# Patient Record
Sex: Female | Born: 1965 | Race: White | Hispanic: No | Marital: Married | Smoking: Current every day smoker
Health system: Southern US, Community
[De-identification: ages and names within clinical notes are randomized; demographics above are authoritative.]

## PROBLEM LIST (undated history)

## (undated) DIAGNOSIS — F17200 Nicotine dependence, unspecified, uncomplicated: Secondary | ICD-10-CM

## (undated) DIAGNOSIS — J189 Pneumonia, unspecified organism: Secondary | ICD-10-CM

## (undated) DIAGNOSIS — Z923 Personal history of irradiation: Secondary | ICD-10-CM

## (undated) DIAGNOSIS — F419 Anxiety disorder, unspecified: Secondary | ICD-10-CM

## (undated) DIAGNOSIS — G8929 Other chronic pain: Secondary | ICD-10-CM

## (undated) DIAGNOSIS — K219 Gastro-esophageal reflux disease without esophagitis: Secondary | ICD-10-CM

## (undated) DIAGNOSIS — E119 Type 2 diabetes mellitus without complications: Secondary | ICD-10-CM

## (undated) DIAGNOSIS — Z9221 Personal history of antineoplastic chemotherapy: Secondary | ICD-10-CM

## (undated) DIAGNOSIS — J449 Chronic obstructive pulmonary disease, unspecified: Secondary | ICD-10-CM

## (undated) DIAGNOSIS — IMO0001 Reserved for inherently not codable concepts without codable children: Secondary | ICD-10-CM

## (undated) DIAGNOSIS — J45909 Unspecified asthma, uncomplicated: Secondary | ICD-10-CM

## (undated) HISTORY — DX: Nicotine dependence, unspecified, uncomplicated: F17.200

## (undated) HISTORY — PX: TONSILLECTOMY: SUR1361

## (undated) HISTORY — DX: Reserved for inherently not codable concepts without codable children: IMO0001

## (undated) HISTORY — PX: CHOLECYSTECTOMY: SHX55

---

## 1999-05-17 ENCOUNTER — Encounter: Payer: Self-pay | Admitting: Emergency Medicine

## 1999-05-17 ENCOUNTER — Emergency Department (HOSPITAL_COMMUNITY): Admission: EM | Admit: 1999-05-17 | Discharge: 1999-05-17 | Payer: Self-pay | Admitting: Emergency Medicine

## 2000-04-03 LAB — HM MAMMOGRAPHY: HM Mammogram: NORMAL

## 2000-05-01 ENCOUNTER — Other Ambulatory Visit: Admission: RE | Admit: 2000-05-01 | Discharge: 2000-05-01 | Payer: Self-pay | Admitting: Family Medicine

## 2000-07-17 ENCOUNTER — Encounter: Admission: RE | Admit: 2000-07-17 | Discharge: 2000-10-15 | Payer: Self-pay | Admitting: Family Medicine

## 2001-01-29 ENCOUNTER — Emergency Department (HOSPITAL_COMMUNITY): Admission: EM | Admit: 2001-01-29 | Discharge: 2001-01-29 | Payer: Self-pay | Admitting: Emergency Medicine

## 2003-04-06 ENCOUNTER — Other Ambulatory Visit: Admission: RE | Admit: 2003-04-06 | Discharge: 2003-04-06 | Payer: Self-pay | Admitting: Family Medicine

## 2005-02-06 ENCOUNTER — Ambulatory Visit: Payer: Self-pay | Admitting: Family Medicine

## 2005-03-23 ENCOUNTER — Emergency Department (HOSPITAL_COMMUNITY): Admission: EM | Admit: 2005-03-23 | Discharge: 2005-03-23 | Payer: Self-pay | Admitting: Emergency Medicine

## 2006-06-05 ENCOUNTER — Other Ambulatory Visit: Admission: RE | Admit: 2006-06-05 | Discharge: 2006-06-05 | Payer: Self-pay | Admitting: Internal Medicine

## 2006-06-05 ENCOUNTER — Encounter (INDEPENDENT_AMBULATORY_CARE_PROVIDER_SITE_OTHER): Payer: Self-pay | Admitting: Internal Medicine

## 2006-06-05 ENCOUNTER — Ambulatory Visit: Payer: Self-pay | Admitting: Family Medicine

## 2006-06-05 DIAGNOSIS — R7309 Other abnormal glucose: Secondary | ICD-10-CM | POA: Insufficient documentation

## 2006-06-05 LAB — CONVERTED CEMR LAB
ALT: 32 units/L (ref 0–40)
AST: 20 units/L (ref 0–37)
Albumin: 4.2 g/dL (ref 3.5–5.2)
Alkaline Phosphatase: 72 units/L (ref 39–117)
BUN: 9 mg/dL (ref 6–23)
Basophils Absolute: 0.1 10*3/uL (ref 0.0–0.1)
Basophils Relative: 0.7 % (ref 0.0–1.0)
Bilirubin, Direct: 0.1 mg/dL (ref 0.0–0.3)
CO2: 28 meq/L (ref 19–32)
Calcium: 9.5 mg/dL (ref 8.4–10.5)
Chloride: 99 meq/L (ref 96–112)
Cholesterol: 225 mg/dL (ref 0–200)
Creatinine, Ser: 0.7 mg/dL (ref 0.4–1.2)
Direct LDL: 130.5 mg/dL
Eosinophils Absolute: 0.3 10*3/uL (ref 0.0–0.6)
Eosinophils Relative: 2.6 % (ref 0.0–5.0)
GFR calc Af Amer: 119 mL/min
GFR calc non Af Amer: 98 mL/min
Glucose, Bld: 105 mg/dL — ABNORMAL HIGH (ref 70–99)
HCT: 42.8 % (ref 36.0–46.0)
HDL: 35.8 mg/dL — ABNORMAL LOW (ref >39.0)
Hemoglobin: 14.8 g/dL (ref 12.0–15.0)
Lymphocytes Relative: 23.6 % (ref 12.0–46.0)
MCHC: 34.7 g/dL (ref 30.0–36.0)
MCV: 94 fL (ref 78.0–100.0)
Monocytes Absolute: 0.5 10*3/uL (ref 0.2–0.7)
Monocytes Relative: 5.5 % (ref 3.0–11.0)
Neutro Abs: 6.6 10*3/uL (ref 1.4–7.7)
Neutrophils Relative %: 67.6 % (ref 43.0–77.0)
Pap Smear: NORMAL
Platelets: 267 10*3/uL (ref 150–400)
Potassium: 4.4 meq/L (ref 3.5–5.1)
RBC: 4.55 M/uL (ref 3.87–5.11)
RDW: 11.4 % — ABNORMAL LOW (ref 11.5–14.6)
Sodium: 134 meq/L — ABNORMAL LOW (ref 135–145)
TSH: 0.61 microintl units/mL (ref 0.35–5.50)
Total Bilirubin: 0.9 mg/dL (ref 0.3–1.2)
Total CHOL/HDL Ratio: 6.3
Total Protein: 7.1 g/dL (ref 6.0–8.3)
Triglycerides: 253 mg/dL (ref 0–149)
VLDL: 51 mg/dL — ABNORMAL HIGH (ref 0–40)
WBC: 9.8 10*3/uL (ref 4.5–10.5)

## 2006-06-11 ENCOUNTER — Ambulatory Visit: Payer: Self-pay | Admitting: Family Medicine

## 2006-07-06 ENCOUNTER — Ambulatory Visit: Payer: Self-pay | Admitting: Family Medicine

## 2006-07-31 ENCOUNTER — Ambulatory Visit: Payer: Self-pay | Admitting: Family Medicine

## 2006-11-11 ENCOUNTER — Telehealth (INDEPENDENT_AMBULATORY_CARE_PROVIDER_SITE_OTHER): Payer: Self-pay | Admitting: *Deleted

## 2006-12-10 ENCOUNTER — Telehealth (INDEPENDENT_AMBULATORY_CARE_PROVIDER_SITE_OTHER): Payer: Self-pay | Admitting: Internal Medicine

## 2006-12-14 ENCOUNTER — Ambulatory Visit: Payer: Self-pay | Admitting: Family Medicine

## 2006-12-14 DIAGNOSIS — F411 Generalized anxiety disorder: Secondary | ICD-10-CM | POA: Insufficient documentation

## 2006-12-14 DIAGNOSIS — N920 Excessive and frequent menstruation with regular cycle: Secondary | ICD-10-CM | POA: Insufficient documentation

## 2006-12-14 DIAGNOSIS — E782 Mixed hyperlipidemia: Secondary | ICD-10-CM | POA: Insufficient documentation

## 2006-12-14 DIAGNOSIS — M545 Low back pain, unspecified: Secondary | ICD-10-CM | POA: Insufficient documentation

## 2006-12-14 DIAGNOSIS — R209 Unspecified disturbances of skin sensation: Secondary | ICD-10-CM | POA: Insufficient documentation

## 2006-12-16 LAB — CONVERTED CEMR LAB
Cholesterol: 210 mg/dL (ref 0–200)
Direct LDL: 121.4 mg/dL
Glucose, Bld: 108 mg/dL — ABNORMAL HIGH (ref 70–99)
HDL: 30.6 mg/dL — ABNORMAL LOW (ref >39.0)
Total CHOL/HDL Ratio: 6.9
Triglycerides: 298 mg/dL (ref 0–149)
VLDL: 60 mg/dL — ABNORMAL HIGH (ref 0–40)

## 2006-12-17 ENCOUNTER — Encounter (INDEPENDENT_AMBULATORY_CARE_PROVIDER_SITE_OTHER): Payer: Self-pay | Admitting: Internal Medicine

## 2006-12-17 ENCOUNTER — Encounter: Payer: Self-pay | Admitting: Family Medicine

## 2006-12-29 ENCOUNTER — Telehealth (INDEPENDENT_AMBULATORY_CARE_PROVIDER_SITE_OTHER): Payer: Self-pay | Admitting: Internal Medicine

## 2007-01-27 ENCOUNTER — Encounter (INDEPENDENT_AMBULATORY_CARE_PROVIDER_SITE_OTHER): Payer: Self-pay | Admitting: Internal Medicine

## 2007-01-27 DIAGNOSIS — F17200 Nicotine dependence, unspecified, uncomplicated: Secondary | ICD-10-CM | POA: Insufficient documentation

## 2007-02-03 ENCOUNTER — Ambulatory Visit: Payer: Self-pay | Admitting: Family Medicine

## 2007-07-06 ENCOUNTER — Telehealth (INDEPENDENT_AMBULATORY_CARE_PROVIDER_SITE_OTHER): Payer: Self-pay | Admitting: Internal Medicine

## 2007-07-12 ENCOUNTER — Ambulatory Visit: Payer: Self-pay | Admitting: Family Medicine

## 2007-07-15 ENCOUNTER — Encounter (INDEPENDENT_AMBULATORY_CARE_PROVIDER_SITE_OTHER): Payer: Self-pay | Admitting: Internal Medicine

## 2007-08-02 ENCOUNTER — Encounter (INDEPENDENT_AMBULATORY_CARE_PROVIDER_SITE_OTHER): Payer: Self-pay | Admitting: Internal Medicine

## 2007-12-15 ENCOUNTER — Ambulatory Visit: Payer: Self-pay | Admitting: Family Medicine

## 2007-12-15 ENCOUNTER — Encounter (INDEPENDENT_AMBULATORY_CARE_PROVIDER_SITE_OTHER): Payer: Self-pay | Admitting: Internal Medicine

## 2007-12-15 DIAGNOSIS — F329 Major depressive disorder, single episode, unspecified: Secondary | ICD-10-CM | POA: Insufficient documentation

## 2007-12-15 DIAGNOSIS — N390 Urinary tract infection, site not specified: Secondary | ICD-10-CM | POA: Insufficient documentation

## 2007-12-15 DIAGNOSIS — F32A Depression, unspecified: Secondary | ICD-10-CM | POA: Insufficient documentation

## 2007-12-15 LAB — CONVERTED CEMR LAB
Bilirubin Urine: NEGATIVE
Glucose, Urine, Semiquant: NEGATIVE
Ketones, urine, test strip: NEGATIVE
Nitrite: NEGATIVE
Protein, U semiquant: NEGATIVE
Specific Gravity, Urine: 1.02
Urobilinogen, UA: 0.2
WBC Urine, dipstick: NEGATIVE
pH: 6.5

## 2007-12-16 ENCOUNTER — Encounter (INDEPENDENT_AMBULATORY_CARE_PROVIDER_SITE_OTHER): Payer: Self-pay | Admitting: Internal Medicine

## 2007-12-16 LAB — CONVERTED CEMR LAB: Hgb A1c MFr Bld: 5.5 % (ref 4.6–6.0)

## 2007-12-17 ENCOUNTER — Encounter (INDEPENDENT_AMBULATORY_CARE_PROVIDER_SITE_OTHER): Payer: Self-pay | Admitting: Internal Medicine

## 2008-02-14 ENCOUNTER — Telehealth: Payer: Self-pay | Admitting: Family Medicine

## 2008-03-13 ENCOUNTER — Telehealth: Payer: Self-pay | Admitting: Family Medicine

## 2008-06-22 ENCOUNTER — Ambulatory Visit: Payer: Self-pay | Admitting: Family Medicine

## 2008-06-22 DIAGNOSIS — L989 Disorder of the skin and subcutaneous tissue, unspecified: Secondary | ICD-10-CM | POA: Insufficient documentation

## 2008-06-23 ENCOUNTER — Ambulatory Visit: Payer: Self-pay | Admitting: Family Medicine

## 2008-06-23 ENCOUNTER — Encounter (INDEPENDENT_AMBULATORY_CARE_PROVIDER_SITE_OTHER): Payer: Self-pay | Admitting: Internal Medicine

## 2008-06-26 ENCOUNTER — Telehealth (INDEPENDENT_AMBULATORY_CARE_PROVIDER_SITE_OTHER): Payer: Self-pay | Admitting: Internal Medicine

## 2008-07-04 ENCOUNTER — Ambulatory Visit: Payer: Self-pay | Admitting: Family Medicine

## 2008-10-10 ENCOUNTER — Telehealth (INDEPENDENT_AMBULATORY_CARE_PROVIDER_SITE_OTHER): Payer: Self-pay | Admitting: Internal Medicine

## 2010-05-07 NOTE — Miscellaneous (Signed)
Summary: Consent for lesion removed on R back  Consent for lesion removed on R back   Imported By: Beau Fanny 06/26/2008 15:04:24  _____________________________________________________________________  External Attachment:    Type:   Image     Comment:   External Document

## 2010-05-07 NOTE — Progress Notes (Signed)
  Phone Note Call from Patient   Caller: pt Call For: [facility] Summary of Call: requesting med change for anxiety. took husband's valium 10 mg. really helped her. consider prescribing this for her? [facility] [address]. Initial call taken by: Cooper Render,  December 10, 2006 12:25 PM  Follow-up for Phone Call        needs office visit to discuss Follow-up by: Gildardo Griffes FNP,  December 10, 2006 1:40 PM  Additional Follow-up for Phone Call Additional follow up Details #1::        pc to pt, appt 12/14/06 at 10:45 am. Additional Follow-up by: Cooper Render,  December 10, 2006 3:01 PM

## 2010-05-07 NOTE — Progress Notes (Signed)
Summary: refill anaprox ds for Julia [name] patient  Phone Note Refill Request Message from:  [employer]line on March 13, 2008 8:42 AM  Refills Requested: Medication #1:  ANAPROX DS 550 MG TABS one tab by mouth two times a day as needed for menstrual cramping..   Last Refilled: 02/14/2008 [facility] [address] [phone]  Initial call taken by: Providence Crosby,  March 13, 2008 8:43 AM    New/Updated Medications: ANAPROX DS 550 MG TABS (NAPROXEN SODIUM) one tab by mouth two times a day as needed for menstrual cramping. (Take with Food)   Prescriptions: ANAPROX DS 550 MG TABS (NAPROXEN SODIUM) one tab by mouth two times a day as needed for menstrual cramping. (Take with Food)  #60 x 0   Entered and Authorized by:   Shaune Leeks MD   Signed by:   Shaune Leeks MD on 03/13/2008   Method used:   Electronically to        [facility] [phone]* (retail)       [address]       [place], [place]  [address]       Ph: [phone]       Fax: [phone]   RxID:   [id number]

## 2010-05-07 NOTE — Assessment & Plan Note (Signed)
Summary: 5 M F/U  DLO   Vital Signs:  Patient Profile:   45 Years Old Female Weight:      159 pounds Temp:     98.5 degrees F oral Pulse rate:   89 / minute BP sitting:   109 / 65  (left arm) Cuff size:   regular  Vitals Entered By: Cooper Render (December 15, 2007 3:44 PM)                 Chief Complaint:  6 mth f/u, ck glucose, and has been elevated.  History of Present Illness: Here for follow up of anxiety and low back pain --stopped prozac 6/09--new job at [employer]--no pressure.  Doing welll off for anxiety but "nerves"  aren't good.    --aunt died unexpectedly --not sleeping well, not crying, goes to work daily, not rested on rising--wants to start dsomething   Having odor in urine for 2 wks, no dysuria or nocturia    Wants blood sugar checked as has been elevated at home, father was diabetic and mom is diabetic.  CBGs running ---175 fasting, 2hrs pp 150    Current Allergies (reviewed today): ! DARVOCET ! RELAFEN ! VIOXX   Family History:    Father: Alive, diabetic, CAD--died at age 24 of ?    Mother: Alive, HTN, --insulin dependent diabetic    Siblings: 2 brothers,                      1 deceased, MVA at age 65                     1 living --kidney failure--resolved, HBP                  1sister--no knowledge        CV + Father    HBP + Mother    DM + Father    Cancer - none    Depression - none    ETOH/Drug abuse - none       Impression & Recommendations:  Problem # 1:  DEPRESSIVE DISORDER NOT ELSEWHERE CLASSIFIED (ICD-311)  The following medications were removed from the medication list:    Prozac 20 Mg Caps (Fluoxetine hcl) .Marland Kitchen... 1 qd  Her updated medication list for this problem includes:    Pristiq 50 Mg Xr24h-tab (Desvenlafaxine succinate) .Marland Kitchen... 1 once daily for depression by mouth   Problem # 2:  HYPERGLYCEMIA (ICD-790.29)  Orders: Venipuncture (62130) TLB-A1C / Hgb A1C (Glycohemoglobin) (83036-A1C)   Problem # 3:  UTI  (ICD-599.0)  Orders: T-Culture, Urine (86578-46962) UA Dipstick W/ Micro (manual) (95284)   Complete Medication List: 1)  Pristiq 50 Mg Xr24h-tab (Desvenlafaxine succinate) .Marland Kitchen.. 1 once daily for depression by mouth   Patient Instructions: 1)  Please schedule a follow-up appointment in 1 month.   ] Prior Medications (reviewed today): Current Allergies (reviewed today): ! DARVOCET ! RELAFEN ! VIOXX  Laboratory Results   Urine Tests  Date/Time Received: December 15, 2007 4:28 PM  Date/Time Reported: December 15, 2007 4:28 PM   Routine Urinalysis   Glucose: negative   (Normal Range: Negative) Bilirubin: negative   (Normal Range: Negative) Ketone: negative   (Normal Range: Negative) Spec. Gravity: 1.020   (Normal Range: 1.003-1.035) Blood: trace-lysed   (Normal Range: Negative) pH: 6.5   (Normal Range: 5.0-8.0) Protein: negative   (Normal Range: Negative) Urobilinogen: 0.2   (Normal Range: 0-1) Nitrite: negative   (  Normal Range: Negative) Leukocyte Esterace: negative   (Normal Range: Negative)

## 2010-05-07 NOTE — Assessment & Plan Note (Signed)
Summary: 6 m f/u  dlo   Vital Signs:  Patient Profile:   45 Years Old Female Weight:      154 pounds Temp:     98.2 degrees F oral Pulse rate:   97 / minute BP sitting:   110 / 73  (right arm) Cuff size:   regular  Vitals Entered By: Cooper Render (July 12, 2007 3:55 PM)                 Chief Complaint:  back pain and 6 mth f/u.  History of Present Illness: Here for follow up of anxiety--having no anxiety attacks, does have more stress at work and at [employer] or suicidal thoughts. --son's house got broken into. --son's best frend killed--motorcycle.  No PT for back as insurance would not pay.Marland KitchenMarland KitchenTaking nothing.  Using heat.      Current Allergies (reviewed today): ! DARVOCET ! RELAFEN ! VIOXX     Review of Systems      See HPI   Physical Exam  General:     alert, well-developed, well-nourished, and well-hydrated.  NAD Neurologic:     alert & oriented X3 and gait normal.   Psych:     Oriented X3, normally interactive, and good eye contact.      Impression & Recommendations:  Problem # 1:  ANXIETY (ICD-300.00) Assessment: Unchanged no new anxiety attacks since on prozac--wants to continue see back in 6 mo or prn Her updated medication list for this problem includes:    Prozac 20 Mg Caps (Fluoxetine hcl) .Marland Kitchen... 1 qd   Problem # 2:  BACK PAIN, LUMBAR (ICD-724.2) Assessment: Unchanged no improvement in low back pain will refer to Ortho for eval see back prn The following medications were removed from the medication list:    Vicodin 5-500 Mg Tabs (Hydrocodone-acetaminophen) .Marland Kitchen... 1 q 6 h as needed pain--caution driving due to drowsiness  Orders: Orthopedic Referral (Ortho)   Complete Medication List: 1)  Prozac 20 Mg Caps (Fluoxetine hcl) .Marland Kitchen.. 1 qd   Patient Instructions: 1)  refer to Ortho low back pain    Prescriptions: PROZAC 20 MG  CAPS (FLUOXETINE HCL) 1 qd  #30 x 6   Entered and Authorized by:   Gildardo Griffes FNP   Signed by:   Gildardo Griffes FNP on 07/12/2007   Method used:   Print then Give to Patient   RxID:   [id number]  ] Prior Medications (reviewed today): PROZAC 20 MG  CAPS (FLUOXETINE HCL) 1 qd Current Allergies (reviewed today): ! DARVOCET ! RELAFEN ! VIOXX

## 2010-05-07 NOTE — Progress Notes (Signed)
Summary: ? Location of lesion  Phone Note From Other Clinic Call back at [phone]   Caller: Tresa Endo from [facility] Call For: Everrett Coombe, FNP Summary of Call: Calling to clarify the location of the lesion that was sent over to them. Initial call taken by: Sydell Axon,  June 26, 2008 11:57 AM  Follow-up for Phone Call        [facility] informed per Billie's office notes. Follow-up by: Sydell Axon,  June 26, 2008 11:59 AM

## 2010-05-07 NOTE — Assessment & Plan Note (Signed)
Summary: 30 MIN APPT MOLE REMOVAL/RBH   Vital Signs:  Patient profile:   45 year old female Weight:      159 pounds Temp:     98.4 degrees F oral Pulse rate:   78 / minute BP sitting:   110 / 60  (left arm) Cuff size:   regular  Vitals Entered ByMarland Kitchen Providence Crosby (June 23, 2008 3:17 PM)  CC:  lesion removal on back.  History of Present Illness: Here for removal of lesion on her R back that was traumatized 2d ago by bra strap--top pulled off.  Rubs on most of her bra straps and becomes irritated.  Allergies: 1)  ! Darvocet 2)  ! Relafen 3)  ! Vioxx  Review of Systems      See HPI  Physical Exam  General:  alert, well-developed, well-nourished, and well-hydrated.   Skin:  lesion  ~79mm tan elevated slightly pearly with upper half removed on R upper back--area cleaned by Betadine, infiltrated with 0.5 ml of 2% lidocaine.  was removed using punch and sent to path.  closed with 1 stitch of 3-0 etithicon, covered Psych:  normally interactive and slightly anxious.     Impression & Recommendations:  Problem # 1:  SKIN LESION (ICD-709.9) Assessment Unchanged  lesion removed by punch, sutured x 1, covered.  lesion sent to path keep clean and dry see back 10d for suture removal or as needed   Orders: Excise lesion (TAL) 0 - 0.5 cm (11400) Surgical Suture Tray (J4782)  Complete Medication List: 1)  Anaprox Ds 550 Mg Tabs (Naproxen sodium) .... One tab by mouth two times a day as needed for menstrual cramping. (take with food) 2)  Ibuprofen 200 Mg Caps (Ibuprofen) .... As needed  Other Orders: Tdap => 71yrs IM (95621) Admin 1st Vaccine (30865)  Patient Instructions: 1)  return in 10d for suture removal   Tetanus/Td Vaccine    Vaccine Type: Tdap    Site: left deltoid    Mfr: GlaxoSmithKline    Dose: 0.5 ml    Route: IM    Given by: Providence Crosby    Exp. Date: 06/29/2010    Lot #: [id number]    VIS given: 02/23/07 version given June 23, 2008.

## 2010-05-07 NOTE — Assessment & Plan Note (Signed)
      Current Allergies: ! DARVOCET ! RELAFEN ! VIOXX     Review of Systems  GU      See HPI  Psych      Complains of anxiety and depression.  Endo      See HPI   Physical Exam  General:     alert, well-developed, well-nourished, and well-hydrated.   Head:     normocephalic, atraumatic, and no abnormalities observed.   Lungs:     normal respiratory effort, no intercostal retractions, no accessory muscle use, and normal breath sounds.   Heart:     normal rate, regular rhythm, and no murmur.   Abdomen:     no CVA tenderness, no tenderness in suprapubic area Psych:     normally interactive, flat affect, and subdued.      Complete Medication List: 1)  Pristiq 50 Mg Xr24h-tab (Desvenlafaxine succinate) .Marland Kitchen.. 1 once daily for depression by mouth    ] Laboratory Results   Urine Tests    Urine Microscopic WBC/hpf: 3-5 RBC/hpf: 0-2 Bacteria: ++ Epithelial: +

## 2010-05-07 NOTE — Progress Notes (Signed)
Summary: refill request for anaprox  Phone Note Refill Request Message from:  Fax from Pharmacy  Refills Requested: Medication #1:  ANAPROX DS 550 MG TABS one tab by mouth two times a day as needed for menstrual cramping. (Take with Food)   Last Refilled: 08/14/2008 Faxed request from [facility] [address]  This was signed off on the other day, but it doesnt look like it was sent.  Initial call taken by: Lowella Petties CMA,  October 10, 2008 10:58 AM  Follow-up for Phone Call        completed  Billie-Lynn Tyler Deis FNP  October 10, 2008 11:12 AM   Medication phoned to [facility] [address] pharmacy as instructed. Follow-up by: Lewanda Rife LPN,  October 10, 1608 11:56 AM      Prescriptions: ANAPROX DS 550 MG TABS (NAPROXEN SODIUM) one tab by mouth two times a day as needed for menstrual cramping. (Take with Food)  #60 x 0   Entered and Authorized by:   Gildardo Griffes FNP   Signed by:   Gildardo Griffes FNP on 10/10/2008   Method used:   Telephoned to ...       [facility] [id number] (retail)       [address]       [place], [place]  [address]       Ph: [phone]       Fax: [phone]   RxID:   [id number]

## 2010-05-07 NOTE — Miscellaneous (Signed)
Summary: Orders Update  Clinical Lists Changes  Medications: Added new medication of CIPRO 500 MG TABS (CIPROFLOXACIN HCL) 1 two times a day for UTI by mouth - Signed Rx of CIPRO 500 MG TABS (CIPROFLOXACIN HCL) 1 two times a day for UTI by mouth;  #14 x 0;  Signed;  Entered by: Gildardo Griffes FNP;  Authorized by: Gildardo Griffes FNP;  Method used: Telephoned to    Prescriptions: CIPRO 500 MG TABS (CIPROFLOXACIN HCL) 1 two times a day for UTI by mouth  #14 x 0   Entered and Authorized by:   Gildardo Griffes FNP   Signed by:   Gildardo Griffes FNP on 12/17/2007   Method used:   Telephoned to ...         RxID:   [id number]  med phoned to pharmacy. pt informed.   Cooper Render  December 17, 2007 5:28 PM

## 2010-05-07 NOTE — Assessment & Plan Note (Signed)
Summary: FOLLOW UP/EVE   Vital Signs:  Patient Profile:   45 Years Old Female Weight:      156 pounds Temp:     98.5 degrees F oral Pulse rate:   80 / minute Pulse rhythm:   regular BP sitting:   100 / 50  (right arm) Cuff size:   regular  Vitals Entered By: Sydell Axon (February 03, 2007 3:50 PM)                 Chief Complaint:  6 month f/u.  History of Present Illness: Here to follow up on change of antianxiety meds fsrom Buspar to Prozac 9/08--feels better, no anxietoy attacks, no crlying.  Problems sleeping but feels that this is due to lher perimenapause.  Smokes 1/2pk per day.  Wants to stay on Prozac.  Still having back pain--tramadol did not help, vicodin helps.  Agrees to try PT.  Current Allergies (reviewed today): ! DARVOCET ! RELAFEN ! VIOXX     Review of Systems      See HPI   Physical Exam  General:     alert, well-developed, and well-nourished.   Head:     normocephalic, atraumatic, and no abnormalities observed.   Lungs:     normal respiratory effort.   Neurologic:     alert & oriented X3 and gait normal.   Skin:     color normal.   Psych:     normally interactive, depressed affect, and flat affect.      Impression & Recommendations:  Problem # 1:  ANXIETY (ICD-300.00) Assessment: Improved much improved on prozac--wants to continue. see back in 6 mo or as needed  Her updated medication list for this problem includes:    Prozac 20 Mg Caps (Fluoxetine hcl) .Marland Kitchen... 1 qd   Problem # 2:  BACK PAIN, LUMBAR (ICD-724.2) Assessment: Unchanged reviewed xrays of L/S spine with her done 12/17/06--neg agrees t go to PT Her updated medication list for this problem includes:    Tramadol Hcl 50 Mg Tabs (Tramadol hcl) .Marland Kitchen... 1 q 6 h as needed pain    Vicodin 5-500 Mg Tabs (Hydrocodone-acetaminophen) .Marland Kitchen... 1 q 6 h as needed pain--caution driving due to drowsiness--rrefilled x1--must l;ast 1 mo  Orders: Physical Therapy Referral (PT)  The  following medications were removed from the medication list:    Tramadol Hcl 50 Mg Tabs (Tramadol hcl) .Marland Kitchen... 1 q 6 h as needed pain  Her updated medication list for this problem includes:    Vicodin 5-500 Mg Tabs (Hydrocodone-acetaminophen) .Marland Kitchen... 1 q 6 h as needed pain--caution driving due to drowsiness   Complete Medication List: 1)  Prozac 20 Mg Caps (Fluoxetine hcl) .Marland Kitchen.. 1 qd 2)  Vicodin 5-500 Mg Tabs (Hydrocodone-acetaminophen) .Marland Kitchen.. 1 q 6 h as needed pain--caution driving due to drowsiness   Patient Instructions: 1)  Please schedule a follow-up appointment in 6 months 2)  .     Prescriptions: VICODIN 5-500 MG  TABS (HYDROCODONE-ACETAMINOPHEN) 1 q 6 h as needed pain--caution driving due to drowsiness  #30 x 0   Entered and Authorized by:   Gildardo Griffes FNP   Signed by:   Gildardo Griffes FNP on 02/03/2007   Method used:   Print then Give to Patient   RxID:   [id number] PROZAC 20 MG  CAPS (FLUOXETINE HCL) 1 qd  #30 x 6   Entered and Authorized by:   Gildardo Griffes FNP   Signed by:   Mcarthur Rossetti  Meleana Commerford FNP on 02/03/2007   Method used:   Print then Give to Patient   RxID:   [id number]  ] Current Allergies (reviewed today): ! DARVOCET ! RELAFEN ! VIOXX

## 2010-05-07 NOTE — Progress Notes (Signed)
Summary: wants meds for cramps  Phone Note Call from Patient Call back at [phone]   Caller: Patient Call For: Billie Bean Complaint: Headache Summary of Call: Pt is having pain in lower abd and back due to menses, she says Billie always gives her vicodin or darvocet, please advise.  [facility]ses [facility] [address] Initial call taken by: Lowella Petties,  February 14, 2008 2:04 PM  Follow-up for Phone Call        Per 8/08 note, wioll use Anaprox. Follow-up by: Shaune Leeks MD,  February 14, 2008 2:40 PM  Additional Follow-up for Phone Call Additional follow up Details #1::        Anaprox called to [facility] [address] Additional Follow-up by: Lowella Petties,  February 14, 2008 3:53 PM    New/Updated Medications: ANAPROX DS 550 MG TABS (NAPROXEN SODIUM) one tab by mouth two times a day as needed for menstrual cramping.   Prescriptions: ANAPROX DS 550 MG TABS (NAPROXEN SODIUM) one tab by mouth two times a day as needed for menstrual cramping.  #30 x 0   Entered and Authorized by:   Shaune Leeks MD   Signed by:   Lowella Petties on 02/14/2008   Method used:   Telephoned to ...       [address]* (retail)       [address]       [place], [place]  [address]       Ph: [phone]       Fax: [phone]   RxID:   [id number]

## 2010-05-07 NOTE — Progress Notes (Signed)
  Phone Note Outgoing Call   Call placed by: Cooper Render,  December 29, 2006 12:51 PM Call placed to: pt Summary of Call: pc to pt to give xray results.  tramadol no help. please call in something for pain to [facility] [address] [phone] Initial call taken by: Cooper Render,  December 29, 2006 12:52 PM  Follow-up for Phone Call        Rx attatched.  If no limprovement in 1 wk will refer to Ortho.   ..................................................................Marland KitchenBillie-Lynn Tyler Deis FNP  December 29, 2006 1:56 PM      New/Updated Medications: VICODIN 5-500 MG  TABS (HYDROCODONE-ACETAMINOPHEN) 1 q6h as needed pain--caution driving due to drowsiness   Prescriptions: VICODIN 5-500 MG  TABS (HYDROCODONE-ACETAMINOPHEN) 1 q6h as needed pain--caution driving due to drowsiness  #20 x 0   Entered and Authorized by:   Gildardo Griffes FNP   Signed by:   Cooper Render on 12/29/2006   Method used:   Telephoned to ...         RxID:   [id number]     Appended Document:  med called to [facility] [address]

## 2011-05-06 ENCOUNTER — Encounter: Payer: Self-pay | Admitting: Family Medicine

## 2011-05-07 ENCOUNTER — Ambulatory Visit (INDEPENDENT_AMBULATORY_CARE_PROVIDER_SITE_OTHER): Payer: No Typology Code available for payment source | Admitting: Family Medicine

## 2011-05-07 ENCOUNTER — Other Ambulatory Visit (HOSPITAL_COMMUNITY)
Admission: RE | Admit: 2011-05-07 | Discharge: 2011-05-07 | Disposition: A | Discharge status: Home / Self Care | Payer: No Typology Code available for payment source | Source: Ambulatory Visit | Attending: Family Medicine | Admitting: Family Medicine

## 2011-05-07 ENCOUNTER — Encounter: Payer: Self-pay | Admitting: Family Medicine

## 2011-05-07 VITALS — BP 100/60 | HR 80 | Temp 98.2°F | Ht 63.5 in | Wt 144.0 lb

## 2011-05-07 DIAGNOSIS — Z1159 Encounter for screening for other viral diseases: Secondary | ICD-10-CM | POA: Insufficient documentation

## 2011-05-07 DIAGNOSIS — E782 Mixed hyperlipidemia: Secondary | ICD-10-CM

## 2011-05-07 DIAGNOSIS — Z124 Encounter for screening for malignant neoplasm of cervix: Secondary | ICD-10-CM

## 2011-05-07 DIAGNOSIS — Z1231 Encounter for screening mammogram for malignant neoplasm of breast: Secondary | ICD-10-CM

## 2011-05-07 DIAGNOSIS — Z01419 Encounter for gynecological examination (general) (routine) without abnormal findings: Secondary | ICD-10-CM | POA: Insufficient documentation

## 2011-05-07 DIAGNOSIS — R7309 Other abnormal glucose: Secondary | ICD-10-CM

## 2011-05-07 DIAGNOSIS — Z0000 Encounter for general adult medical examination without abnormal findings: Secondary | ICD-10-CM

## 2011-05-07 LAB — BASIC METABOLIC PANEL
BUN: 13 mg/dL (ref 6–23)
CO2: 26 mEq/L (ref 19–32)
Calcium: 9.6 mg/dL (ref 8.4–10.5)
Chloride: 106 mEq/L (ref 96–112)
Creatinine, Ser: 0.8 mg/dL (ref 0.4–1.2)
GFR: 85.47 mL/min (ref >60.00)
Glucose, Bld: 105 mg/dL — ABNORMAL HIGH (ref 70–99)
Potassium: 4.2 mEq/L (ref 3.5–5.1)
Sodium: 139 mEq/L (ref 135–145)

## 2011-05-07 LAB — LIPID PANEL
Cholesterol: 226 mg/dL — ABNORMAL HIGH (ref 0–200)
HDL: 40.9 mg/dL (ref >39.00)
Total CHOL/HDL Ratio: 6
Triglycerides: 249 mg/dL — ABNORMAL HIGH (ref 0.0–149.0)
VLDL: 49.8 mg/dL — ABNORMAL HIGH (ref 0.0–40.0)

## 2011-05-07 LAB — LDL CHOLESTEROL, DIRECT: Direct LDL: 126.2 mg/dL

## 2011-05-07 LAB — HM MAMMOGRAPHY: HM Mammogram: NORMAL

## 2011-05-07 NOTE — Progress Notes (Signed)
  Subjective:    Patient ID: Julia [name], female    DOB: 12-01-1965, 46 y.o.   MRN: [id number]  HPI  46 yo here new to me here for CPX.   Tobacco abuse- smokes 1/2 ppd since she was a teenager.  Never quit and does not want to quit now.  LMP four years ago. Denies any abnormal pap smears. Due for mammogram.  Has some chronic knee pain and back pain- works on concrete floors.  Review of Systems See HPI Patient reports no  vision/ hearing changes,anorexia, weight change, fever ,adenopathy, persistant / recurrent hoarseness, swallowing issues, chest pain, edema,persistant / recurrent cough, hemoptysis, dyspnea(rest, exertional, paroxysmal nocturnal), gastrointestinal  bleeding (melena, rectal bleeding), abdominal pain, excessive heart burn, GU symptoms(dysuria, hematuria, pyuria, voiding/incontinence  Issues) syncope, focal weakness, severe memory loss, concerning skin lesions, depression, anxiety, abnormal bruising/bleeding, major joint swelling, breast masses or abnormal vaginal bleeding.       Objective:   Physical Exam BP 100/60  Pulse 80  Temp(Src) 98.2 F (36.8 C) (Oral)  Ht 5' 3.5" (1.613 m)  Wt 144 lb (65.318 kg)  BMI 25.11 kg/m2  General:  Well-developed,well-nourished,in no acute distress; alert,appropriate and cooperative throughout examination Head:  normocephalic and atraumatic.   Eyes:  vision grossly intact, pupils equal, pupils round, and pupils reactive to light.   Ears:  R ear normal and L ear normal.   Nose:  no external deformity.   Mouth:  good dentition.   Neck:  No deformities, masses, or tenderness noted. Breasts:  No mass, nodules, thickening, tenderness, bulging, retraction, inflamation, nipple discharge or skin changes noted.   Lungs:  Normal respiratory effort, chest expands symmetrically. Lungs are clear to auscultation, no crackles or wheezes. Heart:  Normal rate and regular rhythm. S1 and S2 normal without gallop, murmur, click, rub or other extra  sounds. Abdomen:  Bowel sounds positive,abdomen soft and non-tender without masses, organomegaly or hernias noted. Rectal:  no external abnormalities.   Genitalia:  Pelvic Exam:        External: normal female genitalia without lesions or masses        Vagina: normal without lesions or masses        Cervix: normal without lesions or masses        Adnexa: normal bimanual exam without masses or fullness        Uterus: normal by palpation        Pap smear: performed Msk:  No deformity or scoliosis noted of thoracic or lumbar spine.   Extremities:  No clubbing, cyanosis, edema, or deformity noted with normal full range of motion of all joints.   Neurologic:  alert & oriented X3 and gait normal.   Skin:  Intact without suspicious lesions or rashes Cervical Nodes:  No lymphadenopathy noted Axillary Nodes:  No palpable lymphadenopathy Psych:  Cognition and judgment appear intact. Alert and cooperative with normal attention span and concentration. No apparent delusions, illusions, hallucinations      Assessment & Plan:   1. Routine general medical examination at a health care facility  Basic Metabolic Panel (BMET) Lipid Profile MM Digital Screening Cytology - PAP   Reviewed preventive care protocols, scheduled due services, and updated immunizations Discussed nutrition, exercise, diet, and healthy lifestyle.

## 2011-05-07 NOTE — Patient Instructions (Signed)
Nice to meet you. Please stop by to see Julia [name] on your way out to set up your mammogram. We will call you with your lab results.

## 2011-05-08 ENCOUNTER — Encounter: Payer: Self-pay | Admitting: *Deleted

## 2011-05-12 ENCOUNTER — Telehealth: Payer: Self-pay | Admitting: *Deleted

## 2011-05-12 ENCOUNTER — Encounter: Payer: Self-pay | Admitting: Family Medicine

## 2011-05-12 ENCOUNTER — Encounter: Payer: Self-pay | Admitting: *Deleted

## 2011-05-12 NOTE — Telephone Encounter (Signed)
Test results were good. Triglycerides were elevated but actually improved from last time Julia [name] checked them so I suggested ways she could bring them down. It looks like Julia [name] sent in Anaprox or prescription Alleve.  Is that what she is asking for?

## 2011-05-12 NOTE — Telephone Encounter (Signed)
Left message on cell phone voicemail for patient to return call. 

## 2011-05-12 NOTE — Telephone Encounter (Signed)
Patient called stating that she had gotten her test results and does not understand them and would like someone to call her back to discuss these with her. Patient would also like an anti-inflammatory for her knee like Everrett Coombe, NP use to give her. Patient states that she gets off from work at 3:00 PM.

## 2011-05-13 MED ORDER — MELOXICAM 7.5 MG PO TABS: 7.5000 mg | ORAL_TABLET | Freq: Every day | ORAL | Status: DC

## 2011-05-13 NOTE — Telephone Encounter (Signed)
Patient advised as instructed via telephone.  She wanted to know if Dr. Dayton Martes would give her an anti-inflammatory medication for her knee.  Uses [facility].

## 2011-05-13 NOTE — Telephone Encounter (Signed)
mobic sent to her pharmacy. Please do not take this with ibuprofen, alleve or other anti inflammatories.

## 2011-05-13 NOTE — Telephone Encounter (Signed)
Patient advised via message left on cell phone voicemail per her request.

## 2011-06-13 ENCOUNTER — Other Ambulatory Visit: Payer: Self-pay | Admitting: Family Medicine

## 2011-11-30 ENCOUNTER — Emergency Department (HOSPITAL_COMMUNITY)
Admission: EM | Admit: 2011-11-30 | Discharge: 2011-11-30 | Disposition: A | Discharge status: Home / Self Care | Payer: No Typology Code available for payment source | Attending: Emergency Medicine | Admitting: Emergency Medicine

## 2011-11-30 ENCOUNTER — Encounter (HOSPITAL_COMMUNITY): Payer: Self-pay | Admitting: Emergency Medicine

## 2011-11-30 DIAGNOSIS — B353 Tinea pedis: Secondary | ICD-10-CM

## 2011-11-30 DIAGNOSIS — Z886 Allergy status to analgesic agent status: Secondary | ICD-10-CM | POA: Insufficient documentation

## 2011-11-30 DIAGNOSIS — F17200 Nicotine dependence, unspecified, uncomplicated: Secondary | ICD-10-CM | POA: Insufficient documentation

## 2011-11-30 MED ORDER — CLOTRIMAZOLE 1 % EX CREA: TOPICAL_CREAM | CUTANEOUS | Status: DC

## 2011-11-30 MED ORDER — HYDROCODONE-ACETAMINOPHEN 5-500 MG PO TABS: 1.0000 | ORAL_TABLET | Freq: Four times a day (QID) | ORAL | Status: DC | PRN

## 2011-11-30 MED ORDER — FLUCONAZOLE 100 MG PO TABS
150.0000 mg | ORAL_TABLET | Freq: Once | ORAL | Status: AC
  Administered 2011-11-30: 150 mg via ORAL
  Filled 2011-11-30: qty 2

## 2011-11-30 NOTE — ED Provider Notes (Addendum)
History  Scribed for Gwyneth Sprout, MD, the patient was seen in room TR06C/TR06C. This chart was scribed by Candelaria Stagers. The patient's care started at 12:35 PM   CSN: [id number]  Arrival date & time 11/30/11  1056   First MD Initiated Contact with Patient 11/30/11 1224      Chief Complaint  Patient presents with  . Foot Pain    The history is provided by the patient. No language interpreter was used.   Julia [name] is a 46 y.o. female who presents to the Emergency Department complaining of bilateral foot pain that started three days ago.  She is experiencing redness to the toes and some swelling.  She denies injury, new shoes, or new lotions.  She also denies fever or itching.  She has no other medical problems.  Nothing seems to make the pain better or worse.   History reviewed. No pertinent past medical history.  Past Surgical History  Procedure Date  . Cholecystectomy     Family History  Problem Relation Age of Onset  . Hypertension Mother   . Diabetes Mother     insulin dependent  . Diabetes Father   . Heart disease Father     CAD  . Hypertension Brother   . Kidney disease Brother     kidney failure/resolved    History  Substance Use Topics  . Smoking status: Current Everyday Smoker -- 0.5 packs/day for 31 years    Types: Cigarettes  . Smokeless tobacco: Never Used  . Alcohol Use: No    OB History    Grav Para Term Preterm Abortions TAB SAB Ect Mult Living                  Review of Systems  Constitutional: Negative for fever.  Musculoskeletal: Positive for joint swelling (swelling around the toes) and arthralgias (bilaterally foot pain).  Skin: Positive for color change (redness around the toes). Negative for rash and wound.  All other systems reviewed and are negative.    Allergies  Propoxyphene-acetaminophen; Nabumetone; and Rofecoxib  Home Medications   Current Outpatient Rx  Name Route Sig Dispense Refill  . OVER THE COUNTER  MEDICATION Oral Take 2 tablets by mouth 2 (two) times daily as needed. "BackAid" for back pain.      BP 112/65  Pulse 82  Temp 97.9 F (36.6 C) (Oral)  Resp 18  SpO2 95%  Physical Exam  Nursing note and vitals reviewed. Constitutional: She is oriented to person, place, and time. She appears well-developed and well-nourished. No distress.  HENT:  Head: Normocephalic and atraumatic.  Eyes: EOM are normal. Pupils are equal, round, and reactive to light.  Pulmonary/Chest: Effort normal. No respiratory distress.  Musculoskeletal: Normal range of motion.       Redness, tenderness, and maceration on dorsal surface of bilateral feet along the toes.  No fluctuant, drainage, or induration   Neurological: She is alert and oriented to person, place, and time.  Skin: Skin is warm and dry. She is not diaphoretic.  Psychiatric: She has a normal mood and affect. Her behavior is normal.    ED Course  Procedures   DIAGNOSTIC STUDIES: Oxygen Saturation is 95% on room air, normal by my interpretation.    COORDINATION OF CARE:     Labs Reviewed - No data to display No results found.   1. Tinea pedis       MDM   Pt with sx most consistent with tinea.  No signs  of gout or bacterial infection. No trauma or bony tenderness.  I personally performed the services described in this documentation, which was scribed in my presence.  The recorded information has been reviewed and considered.         Gwyneth Sprout, MD 11/30/11 1250  Gwyneth Sprout, MD 11/30/11 1250

## 2011-11-30 NOTE — ED Notes (Signed)
Pt c/o bilateral foot pain and redness to toes with some swelling; pt denies injury

## 2012-02-11 ENCOUNTER — Encounter: Payer: Self-pay | Admitting: Family Medicine

## 2012-02-11 ENCOUNTER — Ambulatory Visit (INDEPENDENT_AMBULATORY_CARE_PROVIDER_SITE_OTHER): Payer: No Typology Code available for payment source | Admitting: Family Medicine

## 2012-02-11 VITALS — BP 110/72 | Temp 98.1°F | Wt 146.0 lb

## 2012-02-11 DIAGNOSIS — L0291 Cutaneous abscess, unspecified: Secondary | ICD-10-CM

## 2012-02-11 DIAGNOSIS — L0390 Cellulitis, unspecified: Secondary | ICD-10-CM

## 2012-02-11 MED ORDER — SULFAMETHOXAZOLE-TRIMETHOPRIM 800-160 MG PO TABS: 1.0000 | ORAL_TABLET | Freq: Two times a day (BID) | ORAL | Status: DC

## 2012-02-11 NOTE — Patient Instructions (Addendum)
Good to see you.  Please take antibiotic as directed- 1 tablet twice daily x 10 days.  Call me next week with an update.

## 2012-02-11 NOTE — Progress Notes (Signed)
  Subjective:    Patient ID: Julia [name], female    DOB: [birth year], 46 y.o.   MRN: [id number]  HPI 46 yo here for areas on her right neck that won't heal.  Approximately 1 month ago, she brushed up against a bush and had some abrasions.  Those areas have not healed and are now bigger and painful. No drainage.  No fevers or chills.  No difficulty swallowing.  She is a smoker.  Patient Active Problem List  Diagnosis  . HYPERLIPIDEMIA, MIXED  . ANXIETY  . TOBACCO ABUSE  . DEPRESSIVE DISORDER NOT ELSEWHERE CLASSIFIED  . UTI  . EXCESSIVE MENSTRUATION  . SKIN LESION  . BACK PAIN, LUMBAR  . NUMBNESS  . HYPERGLYCEMIA  . Routine general medical examination at a health care facility   No past medical history on file. Past Surgical History  Procedure Date  . Cholecystectomy    History  Substance Use Topics  . Smoking status: Current Every Day Smoker -- 0.5 packs/day for 31 years    Types: Cigarettes  . Smokeless tobacco: Never Used  . Alcohol Use: No   Family History  Problem Relation Age of Onset  . Hypertension Mother   . Diabetes Mother     insulin dependent  . Diabetes Father   . Heart disease Father     CAD  . Hypertension Brother   . Kidney disease Brother     kidney failure/resolved   Allergies  Allergen Reactions  . Propoxyphene-Acetaminophen Hives  . Nabumetone Rash  . Rofecoxib Rash    nightmares   Current Outpatient Prescriptions on File Prior to Visit  Medication Sig Dispense Refill  . clotrimazole (LOTRIMIN) 1 % cream Apply to affected area 2 times daily  15 g  0  . OVER THE COUNTER MEDICATION Take 2 tablets by mouth 2 (two) times daily as needed. "BackAid" for back pain.       The PMH, PSH, Social History, Family History, Medications, and allergies have been reviewed in [facility], and have been updated if relevant.    Review of Systems  Constitutional: Negative for chills, diaphoresis, appetite change and fatigue.  HENT: Negative for facial swelling  and neck stiffness.        Objective:   Physical Exam  Constitutional: She appears well-developed. No distress.  Neck: Normal range of motion. Neck supple.  Lymphadenopathy:    She has no cervical adenopathy.  Skin:     Psychiatric: She has a normal mood and affect. Her behavior is normal. Judgment and thought content normal.          Assessment & Plan:  1.  Cellulitis-  Advised to keep area clean.  Start Bactrim DS- 1 tablet twice daily x 10 days.  She will call next week with an update.

## 2012-11-12 ENCOUNTER — Encounter: Payer: Self-pay | Admitting: Family Medicine

## 2012-11-12 ENCOUNTER — Ambulatory Visit (INDEPENDENT_AMBULATORY_CARE_PROVIDER_SITE_OTHER): Payer: BC Managed Care – PPO | Admitting: Family Medicine

## 2012-11-12 ENCOUNTER — Telehealth: Payer: Self-pay

## 2012-11-12 VITALS — BP 118/60 | HR 77 | Temp 98.3°F | Wt 140.8 lb

## 2012-11-12 DIAGNOSIS — B029 Zoster without complications: Secondary | ICD-10-CM

## 2012-11-12 MED ORDER — VALACYCLOVIR HCL 1 G PO TABS: 1000.0000 mg | ORAL_TABLET | Freq: Three times a day (TID) | ORAL | Status: DC

## 2012-11-12 MED ORDER — HYDROCODONE-ACETAMINOPHEN 5-325 MG PO TABS: 1.0000 | ORAL_TABLET | Freq: Four times a day (QID) | ORAL | Status: DC | PRN

## 2012-11-12 MED ORDER — VALACYCLOVIR HCL 1 G PO TABS: 1000.0000 mg | ORAL_TABLET | Freq: Two times a day (BID) | ORAL | Status: DC

## 2012-11-12 NOTE — Telephone Encounter (Signed)
Triage Record Num: [id number] Operator: Julia [name] Patient Name: Julia [name] Call Date & Time: 11/11/2012 10:16:10PM Patient Phone: [phone] PCP: Julia [name] Patient Gender: Female PCP Fax : [phone] Patient DOB: [birth year] [facility] Name[facility] Reason for Call: Caller: Julia [name]/Patient; PCP: Other; CB#: [phone]; Call regarding Possible shingles; pain under bra; itching/burning; 11/01/12 noted pain in back; 11/08/12 noted bumps under right breast starting to wrap around to side and back; Bumps are red, look like like pimples; Itching and burning; Painful; Per Skin Lesions Protocol "New onset of painful blisters without an obvious cause such as thermal or chemical burns." Home care advice given. Reviewed Benadryl and Ibuprofen adult doses; Advised to see provider within 24 hours. Request late afternoon appt, advised to f/u with office in am for scheduling. Protocol(s) Used: Skin Lesions Recommended Outcome per Protocol: See Provider within 24 hours Reason for Outcome: New onset of painful blisters without an obvious cause such as thermal or chemical burns Care Advice: Avoid physical contact with anyone who has never had chickenpox or with pregnant women, newborns or immunocompromised individuals. Blisters are contagious until all break and are crusted. ~ ~ Take a cool bath/shower to relieve itching. Do not use hot water as this may aggravate itching. ~ HEALTH PROMOTION / MAINTENANCE Antiviral medication may be prescribed by provider to limit symptoms but it should be started within 48 hours of the onset of symptoms. If given within 48 hours, it may help prevent post-herpetic pain. ~ There is now a vaccine to prevent shingles. It is recommended that all adults over the age of 5 receive the vaccine, even if they have already had the shingles, as a few people may develop it a second time. ~ See provider immediately if the blisters/rash is on the face, nose  or eye area. Shingles of the eye can have serious complications including vision loss. ~ ~ Call provider if having severe pain that is not relieved with over the counter medicines. ~ SYMPTOM / CONDITION MANAGEMENT See provider within 4 hours if having signs and symptoms of local infection such as redness or red streaks, tenderness, warmth, swelling, or purulent drainage). ~ ~ INFECTION CONTROL ~ CAUTIONS Analgesic/Antipyretic Advice - Acetaminophen: Consider acetaminophen as directed on label or by pharmacist/provider for pain or fever PRECAUTIONS: - Use if there is no history of liver disease, alcoholism, or intake of three or more alcohol drinks per day - Only if approved by provider during pregnancy or when breastfeeding - During pregnancy, acetaminophen should not be taken more than 3 consecutive days without telling provider - Do not exceed recommended dose or frequency ~ Analgesic/Antipyretic Advice - NSAIDs: Consider aspirin, ibuprofen, naproxen or ketoprofen for pain or fever as directed on label or by pharmacist/provider. PRECAUTIONS: - If over 34 years of age, should not take longer than 1 week without consulting provider. EXCEPTIONS: - Should not be used if taking blood thinners or have bleeding problems. ~

## 2012-11-12 NOTE — Telephone Encounter (Signed)
Will see today.  

## 2012-11-12 NOTE — Patient Instructions (Addendum)
You have shingles.  Start the valtrex today and take the hydrocodone for pain.  Take care.  If the pain isn't better, let us know.

## 2012-11-14 DIAGNOSIS — B029 Zoster without complications: Secondary | ICD-10-CM | POA: Insufficient documentation

## 2012-11-14 NOTE — Progress Notes (Signed)
Painful sensitive burning red rash in dermatomal distribution on R chest wall for about 1 week.  H/o varicella in childhood.    Pt's sister died last week.  Condolences offered.   Meds, vitals, and allergies reviewed.   ROS: See HPI.  Otherwise, noncontributory.  nad but uncomfortable red rash in dermatomal distribution on R chest wall, sensitive to light touch

## 2012-11-14 NOTE — Assessment & Plan Note (Signed)
Start valtrex.  vicodin for pain.  She can tolerate this med, has done so prev.  Path/phys d/w pt.  She is not around young, unvaccinated children.  F/u prn.  She may need gabapentin or other meds added on later.

## 2012-11-15 ENCOUNTER — Telehealth: Payer: Self-pay | Admitting: Family Medicine

## 2012-11-15 NOTE — Telephone Encounter (Signed)
[facility] Triage Call Report Triage Record Num: [id number] Operator: Maryfrances Bunnell Patient Name: Antonio Woodhams Call Date & Time: 11/11/2012 10:16:10PM Patient Phone: [phone] PCP: Marga Melnick Patient Gender: Female PCP Fax : [phone] Patient DOB: 01-Jan-1966 [facility] Reason for Call: Caller: Maiko/Patient; PCP: Other; CB#: [phone]; Call regarding Possible shingles; pain under bra; itching/burning; 11/01/12 noted pain in back; 11/08/12 noted bumps under right breast starting to wrap around to side and back; Bumps are red, look like like pimples; Itching and burning; Painful; Per Skin Lesions Protocol "New onset of painful blisters without an obvious cause such as thermal or chemical burns." Home care advice given. Reviewed Benadryl and Ibuprofen adult doses; Advised to see provider within 24 hours. Request late afternoon appt, advised to f/u with office in am for scheduling. Protocol(s) Used: Skin Lesions Recommended Outcome per Protocol: See Provider within 24 hours Reason for Outcome: New onset of painful blisters without an obvious cause such as thermal or chemical burns Care Advice: Avoid physical contact with anyone who has never had chickenpox or with pregnant women, newborns or immunocompromised individuals. Blisters are contagious until all break and are crusted. ~ ~ Take a cool bath/shower to relieve itching. Do not use hot water as this may aggravate itching. ~ HEALTH PROMOTION / MAINTENANCE Antiviral medication may be prescribed by provider to limit symptoms but it should be started within 48 hours of the onset of symptoms. If given within 48 hours, it may help prevent post-herpetic pain. ~ There is now a vaccine to prevent shingles. It is recommended that all adults over the age of 46 receive the vaccine, even if they have already had the shingles, as a few people may develop it a second time. ~ See provider immediately if the  blisters/rash is on the face, nose or eye area. Shingles of the eye can have serious complications including vision loss. ~ ~ Call provider if having severe pain that is not relieved with over the counter medicines. ~ SYMPTOM / CONDITION MANAGEMENT See provider within 4 hours if having signs and symptoms of local infection such as redness or red streaks, tenderness, warmth, swelling, or purulent drainage). ~ ~ INFECTION CONTROL ~ CAUTIONS Analgesic/Antipyretic Advice - Acetaminophen: Consider acetaminophen as directed on label or by pharmacist/provider for pain or fever PRECAUTIONS: - Use if there is no history of liver disease, alcoholism, or intake of three or more alcohol drinks per day - Only if approved by provider during pregnancy or when breastfeeding - During pregnancy, acetaminophen should not be taken more than 3 consecutive days without telling provider - Do not exceed recommended dose or frequency ~ Analgesic/Antipyretic Advice - NSAIDs: Consider aspirin, ibuprofen, naproxen or ketoprofen for pain or fever as directed on label or by pharmacist/provider. PRECAUTIONS: - If over 60 years of age, should not take longer than 1 week without consulting provider. EXCEPTIONS: - Should not be used if taking blood thinners or have bleeding problems. ~ 11/11/2012 10:27:54PM Page 1 of 2 [id number] [facility] Triage Call Report Patient Name: Ellysa Parrack continuation page/s - Do not use if have history of sensitivity/allergy to any of these medications; or history of cardiovascular, ulcer, kidney, liver disease or diabetes unless approved by provider. - Do not exceed recommended dose or frequency. ~ Keep lesions clean using mild soap and water. Wash hands frequently. Do not touch, scratch, or break blisters. Intact blisters are thought to reduce pain and decrease chance of secondary infection. Cover blisters until they are crusted  over. Apply cool wet compresses to the  affected area for 10-15 minutes to help relieve pain and itching. ~ For symptom relief, consider nonprescription antihistamines (such as Allerest, Allegra, Claritin, Zyrtec, Chlor-Trimetron, Benadryl, etc.) as directed on label or by pharmacist. Drowsiness may result, especially in geriatric patients. Non-sedating antihistamines are available without a prescription. ~ 11/11/2012 10:27:54PM Page 2 of 2 [email or web address]_[email or web address]_[email or web address]

## 2012-11-15 NOTE — Telephone Encounter (Signed)
Seen on Friday.

## 2012-11-24 ENCOUNTER — Other Ambulatory Visit: Payer: Self-pay | Admitting: Family Medicine

## 2012-11-25 NOTE — Telephone Encounter (Signed)
[facility]co called to [facility].

## 2012-11-25 NOTE — Telephone Encounter (Signed)
Yes, on 8/8.

## 2012-11-25 NOTE — Telephone Encounter (Signed)
I did not see her for this.  Was she diagnosed with shingles?

## 2012-12-17 ENCOUNTER — Other Ambulatory Visit: Payer: Self-pay | Admitting: Family Medicine

## 2012-12-17 NOTE — Telephone Encounter (Signed)
Electronic refill request.  Please advise. 

## 2013-01-08 ENCOUNTER — Other Ambulatory Visit: Payer: Self-pay | Admitting: Family Medicine

## 2013-01-09 NOTE — Telephone Encounter (Signed)
Last office visit 11/12/12 with Dr. Para March.  Ok to refill?

## 2013-01-10 ENCOUNTER — Other Ambulatory Visit: Payer: Self-pay | Admitting: *Deleted

## 2013-01-10 ENCOUNTER — Other Ambulatory Visit: Payer: Self-pay

## 2013-01-10 MED ORDER — HYDROCODONE-ACETAMINOPHEN 5-325 MG PO TABS: ORAL_TABLET | ORAL | Status: DC

## 2013-01-10 NOTE — Telephone Encounter (Signed)
Electronic refill request.  Please advise. 

## 2013-01-10 NOTE — Telephone Encounter (Signed)
Pt still having pain in between shoulder blades from shingles; pt request rx hydrocodone apap. Call when ready for pick up.

## 2013-01-10 NOTE — Telephone Encounter (Signed)
Ok to refill one time only.  Needs to be evaluated if she still needs narcotics beyond another refill.

## 2013-01-10 NOTE — Telephone Encounter (Signed)
Placed on Dr. Elmer Sow desk for signature.

## 2013-01-10 NOTE — Telephone Encounter (Signed)
For which rx? 

## 2013-01-11 ENCOUNTER — Other Ambulatory Visit: Payer: Self-pay | Admitting: Family Medicine

## 2013-01-11 NOTE — Telephone Encounter (Signed)
RX placed at front desk and pt aware.

## 2013-01-11 NOTE — Telephone Encounter (Signed)
Electronic refill request.  Please advise. 

## 2013-04-14 ENCOUNTER — Telehealth: Payer: Self-pay

## 2013-04-14 ENCOUNTER — Ambulatory Visit (INDEPENDENT_AMBULATORY_CARE_PROVIDER_SITE_OTHER): Payer: BC Managed Care – PPO | Admitting: Internal Medicine

## 2013-04-14 ENCOUNTER — Encounter: Payer: Self-pay | Admitting: Internal Medicine

## 2013-04-14 VITALS — BP 126/80 | HR 71 | Temp 98.2°F | Wt 139.5 lb

## 2013-04-14 DIAGNOSIS — B029 Zoster without complications: Secondary | ICD-10-CM

## 2013-04-14 MED ORDER — VALACYCLOVIR HCL 1 G PO TABS: ORAL_TABLET | ORAL | Status: DC

## 2013-04-14 MED ORDER — TRAMADOL HCL 50 MG PO TABS: 50.0000 mg | ORAL_TABLET | Freq: Three times a day (TID) | ORAL | Status: DC | PRN

## 2013-04-14 NOTE — Patient Instructions (Signed)
Shingles Shingles (herpes zoster) is an infection that is caused by the same virus that causes chickenpox (varicella). The infection causes a painful skin rash and fluid-filled blisters, which eventually break open, crust over, and heal. It may occur in any area of the body, but it usually affects only one side of the body or face. The pain of shingles usually lasts about 1 month. However, some people with shingles may develop long-term (chronic) pain in the affected area of the body. Shingles often occurs many years after the person had chickenpox. It is more common:  In people older than 50 years.  In people with weakened immune systems, such as those with HIV, AIDS, or cancer.  In people taking medicines that weaken the immune system, such as transplant medicines.  In people under great stress. CAUSES  Shingles is caused by the varicella zoster virus (VZV), which also causes chickenpox. After a person is infected with the virus, it can remain in the person's body for years in an inactive state (dormant). To cause shingles, the virus reactivates and breaks out as an infection in a nerve root. The virus can be spread from person to person (contagious) through contact with open blisters of the shingles rash. It will only spread to people who have not had chickenpox. When these people are exposed to the virus, they may develop chickenpox. They will not develop shingles. Once the blisters scab over, the person is no longer contagious and cannot spread the virus to others. SYMPTOMS  Shingles shows up in stages. The initial symptoms may be pain, itching, and tingling in an area of the skin. This pain is usually described as burning, stabbing, or throbbing.In a few days or weeks, a painful red rash will appear in the area where the pain, itching, and tingling were felt. The rash is usually on one side of the body in a band or belt-like pattern. Then, the rash usually turns into fluid-filled blisters. They  will scab over and dry up in approximately 2 3 weeks. Flu-like symptoms may also occur with the initial symptoms, the rash, or the blisters. These may include:  Fever.  Chills.  Headache.  Upset stomach. DIAGNOSIS  Your caregiver will perform a skin exam to diagnose shingles. Skin scrapings or fluid samples may also be taken from the blisters. This sample will be examined under a microscope or sent to a lab for further testing. TREATMENT  There is no specific cure for shingles. Your caregiver will likely prescribe medicines to help you manage the pain, recover faster, and avoid long-term problems. This may include antiviral drugs, anti-inflammatory drugs, and pain medicines. HOME CARE INSTRUCTIONS   Take a cool bath or apply cool compresses to the area of the rash or blisters as directed. This may help with the pain and itching.   Only take over-the-counter or prescription medicines as directed by your caregiver.   Rest as directed by your caregiver.  Keep your rash and blisters clean with mild soap and cool water or as directed by your caregiver.  Do not pick your blisters or scratch your rash. Apply an anti-itch cream or numbing creams to the affected area as directed by your caregiver.  Keep your shingles rash covered with a loose bandage (dressing).  Avoid skin contact with:  Babies.   Pregnant women.   Children with eczema.   Elderly people with transplants.   People with chronic illnesses, such as leukemia or AIDS.   Wear loose-fitting clothing to help ease   the pain of material rubbing against the rash.  Keep all follow-up appointments with your caregiver.If the area involved is on your face, you may receive a referral for follow-up to a specialist, such as an eye doctor (ophthalmologist) or an ear, nose, and throat (ENT) doctor. Keeping all follow-up appointments will help you avoid eye complications, chronic pain, or disability.  SEEK IMMEDIATE MEDICAL  CARE IF:   You have facial pain, pain around the eye area, or loss of feeling on one side of your face.  You have ear pain or ringing in your ear.  You have loss of taste.  Your pain is not relieved with prescribed medicines.   Your redness or swelling spreads.   You have more pain and swelling.  Your condition is worsening or has changed.   You have a feveror persistent symptoms for more than 2 3 days.  You have a fever and your symptoms suddenly get worse. MAKE SURE YOU:  Understand these instructions.  Will watch your condition.  Will get help right away if you are not doing well or get worse. Document Released: 03/24/2005 Document Revised: 12/17/2011 Document Reviewed: 11/06/2011 [employer] Patient Information 2014 [employer].  

## 2013-04-14 NOTE — Telephone Encounter (Signed)
Scheduled apptmt w/Regina Baity at 4:15 pm

## 2013-04-14 NOTE — Telephone Encounter (Signed)
Rose spoke with Dr Deborra Medina and Kalman Shan will schedule appt.

## 2013-04-14 NOTE — Progress Notes (Signed)
Pre-visit discussion using our clinic review tool. No additional management support is needed unless otherwise documented below in the visit note.  

## 2013-04-14 NOTE — Telephone Encounter (Signed)
Triage Record Num: [id number] Operator: Flonnie Hailstone Patient Name: Julia [name] Call Date & Time: 04/13/2013 7:42:36PM Patient Phone: [phone] PCP: Elsie Stain Patient Gender: Female PCP Fax : Patient DOB: 10/22/1965 [facility] Name[facility] Reason for Call: Caller: Paulla/Patient; PCP: Other; CB#: [phone]; Has outbreak of shingles since 1-5. Is on back of head. Areas itch. Is taking Motrin 800mg  every 8hrs as needed. Advised Benadryl 25mg  every 6hrs as needed for itching. Per Skin Lesion protocol, advised appointment 1-8 due to burning/tingling fluid filled lesions. No available appointments to schedule. Advised call office 1-8 for possible appointment. Protocol(s) Used: Skin Lesions Recommended Outcome per Protocol: See Provider within 24 hours Reason for Outcome: Burning or tingling feeling, itchiness or stabbing pain in a localized area on one side of body followed by reddened fluid-filled blisters that break and crust Care Advice: Analgesic/Antipyretic Advice - NSAIDs: Consider aspirin, ibuprofen, naproxen or ketoprofen for pain or fever as directed on label or by pharmacist/provider. PRECAUTIONS: - If over 11 years of age, should not take longer than 1 week without consulting provider. EXCEPTIONS: - Should not be used if taking blood thinners or have bleeding problems. - Do not use if have history of sensitivity/allergy to any of these medications; or history of cardiovascular, ulcer, kidney, liver disease or diabetes unless approved by provider. - Do not exceed recommended dose or frequency. ~ For symptom relief, consider nonprescription antihistamines (such as Allerest, Allegra, Claritin, Zyrtec, Chlor-Trimetron, Benadryl, etc.) as directed on label or by pharmacist. Drowsiness may result, especially in geriatric patients. Non-sedating antihistamines are available without a prescription. ~ 04/13/2013 7:52:34PM Page 1 of 1 [email or web address]_[id number]

## 2013-04-14 NOTE — Progress Notes (Signed)
Subjective:    Patient ID: Julia [name], female    DOB: 05/30/1965, 48 y.o.   MRN: [id number]  HPI  Pt presents to the clinic today with c/o a rash. It is located on the back of her scalp. She noticed this last night. She also has two places that popped up on her face today. The rash burns. She has had shingles in the past and reports she thinks this is the same.  Review of Systems      History reviewed. No pertinent past medical history.  No current outpatient prescriptions on file.   No current facility-administered medications for this visit.    Allergies  Allergen Reactions  . Propoxyphene N-Acetaminophen Hives  . Nabumetone Rash  . Rofecoxib Rash    nightmares    Family History  Problem Relation Age of Onset  . Hypertension Mother   . Diabetes Mother     insulin dependent  . Diabetes Father   . Heart disease Father     CAD  . Hypertension Brother   . Kidney disease Brother     kidney failure/resolved    History   Social History  . Marital Status: Married    Spouse Name: N/A    Number of Children: 1  . Years of Education: N/A   Occupational History  . 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 Smoking status: Current Every Day Smoker -- 0.50 packs/day for 31 years    Types: Cigarettes  . Smokeless tobacco: Never Used  . Alcohol Use: No  . Drug Use: No  . Sexual Activity: Not on file   Other Topics Concern  . Not on file   Social History Narrative   One son     Constitutional: Denies fever, malaise, fatigue, headache or abrupt weight changes.     No other specific complaints in a complete review of systems (except as listed in HPI above).  Objective:   Physical Exam   BP 126/80  Pulse 71  Temp(Src) 98.2 F (36.8 C) (Oral)  Wt 139 lb 8 oz (63.277 kg)  SpO2 98% Wt Readings from Last 3 Encounters:  04/14/13 139 lb 8 oz (63.277 kg)  11/12/12 140 lb 12 oz (63.844 kg)  02/11/12 146 lb (66.225 kg)    General:  Appears her stated age, well developed, well nourished in NAD. Skin: Warm, dry and intact. Annular vesicular lesions noted at base of scalp and right forehead. Cardiovascular: Normal rate and rhythm. S1,S2 noted.  No murmur, rubs or gallops noted. No JVD or BLE edema. No carotid bruits noted. Pulmonary/Chest: Normal effort and positive vesicular breath sounds. No respiratory distress. No wheezes, rales or ronchi noted.      BMET    Component Value Date/Time   NA 139 05/07/2011 0939   K 4.2 05/07/2011 0939   CL 106 05/07/2011 0939   CO2 26 05/07/2011 0939   GLUCOSE 105* 05/07/2011 0939   BUN 13 05/07/2011 0939   CREATININE 0.8 05/07/2011 0939   CALCIUM 9.6 05/07/2011 0939   GFRNONAA 98 06/05/2006 1117   GFRAA 119 06/05/2006 1117    Lipid Panel     Component Value Date/Time   CHOL 226* 05/07/2011 0939   TRIG 249.0* 05/07/2011 0939   HDL 40.90 05/07/2011 0939   CHOLHDL 6 05/07/2011 0939   VLDL 49.8* 05/07/2011 0939    CBC    Component Value Date/Time   WBC 9.8 06/05/2006 1117   RBC 4.55  06/05/2006 1117   HGB 14.8 06/05/2006 1117   HCT 42.8 06/05/2006 1117   PLT 267 06/05/2006 1117   MCV 94.0 06/05/2006 1117   MCHC 34.7 06/05/2006 1117   RDW 11.4* 06/05/2006 1117   MONOABS 0.5 06/05/2006 1117   EOSABS 0.3 06/05/2006 1117   BASOSABS 0.1 06/05/2006 1117    Hgb A1C Lab Results  Component Value Date   HGBA1C 5.5 12/15/2007        Assessment & Plan:   Shingles:  eRx for valtrex TID x 7 days eRx for tramadol  RTC as needed

## 2013-04-14 NOTE — Telephone Encounter (Signed)
Ok to tell her to come in now and put her on the schedule.  She may have to wait.

## 2013-05-03 ENCOUNTER — Other Ambulatory Visit: Payer: Self-pay | Admitting: Internal Medicine

## 2013-05-03 NOTE — Telephone Encounter (Signed)
Rx called to pharmacy

## 2013-05-03 NOTE — Telephone Encounter (Signed)
Dr Aron pt 

## 2013-05-03 NOTE — Telephone Encounter (Signed)
Last filled 04/14/13-- BID prn #30--please advise

## 2013-07-07 ENCOUNTER — Telehealth: Payer: Self-pay

## 2013-07-07 DIAGNOSIS — B029 Zoster without complications: Secondary | ICD-10-CM

## 2013-07-07 MED ORDER — HYDROCODONE-ACETAMINOPHEN 5-325 MG PO TABS: ORAL_TABLET | ORAL | Status: DC

## 2013-07-07 MED ORDER — VALACYCLOVIR HCL 1 G PO TABS: ORAL_TABLET | ORAL | Status: DC

## 2013-07-07 NOTE — Telephone Encounter (Signed)
Noted.  She needs to be evaluated but I will send in rx for valtrex for now.  If she wants a narcotic, she will need to pick up rx.  I will print out rx for Norco.  Please verify she can take this- has similar compound on allergy list but it appears she took Norco last time she had shingles.

## 2013-07-07 NOTE — Telephone Encounter (Addendum)
Pt left v/m; pt breaking out with shingles again, pt said very painful. Left v/m for pt to cb. Pt breaking out on back with shingles again, areas are popping up with no blistering yet. Pt said very painful; pain level now is 8 after taking 8 ibuprofen 200 mg. Advised pt should not take more than 4 ibuprofen 200 mg at a time. Pt said the 8 ibuprofen did not help pain. Pt request pain med and valtrex to [facility]. Pt said tramadol did not help pain the last shingles breakout.pt request cb.

## 2013-07-07 NOTE — Telephone Encounter (Signed)
Attempted to contact pt. Unable to leave a message. Rx is available for pickup at the front desk.

## 2013-07-13 NOTE — Telephone Encounter (Signed)
Noted, thanks!

## 2013-07-13 NOTE — Telephone Encounter (Signed)
Pt called to check on status of med.Patient notified as instructed by telephone. Pt will pick up valtrex and Norco today; pt said she has taken Norco before and can take OK with no problem. Pt said shingles has spread to neck and chin; no break out near eyes. Pt scheduled appt to see Dr Damita Dunnings 07/14/13 at 4:15 pm. 1st available appt pt could take due to pt's work schedule. Advised pt if condition changed or worsened to call office prior to appt.pt voiced understanding.

## 2013-07-14 ENCOUNTER — Ambulatory Visit: Payer: BC Managed Care – PPO | Admitting: Family Medicine

## 2013-07-15 ENCOUNTER — Ambulatory Visit: Payer: BC Managed Care – PPO | Admitting: Family Medicine

## 2013-10-27 ENCOUNTER — Ambulatory Visit (INDEPENDENT_AMBULATORY_CARE_PROVIDER_SITE_OTHER): Payer: BC Managed Care – PPO | Admitting: Family Medicine

## 2013-10-27 ENCOUNTER — Encounter: Payer: Self-pay | Admitting: Family Medicine

## 2013-10-27 VITALS — BP 100/70 | HR 84 | Temp 98.1°F | Ht 62.56 in | Wt 138.5 lb

## 2013-10-27 DIAGNOSIS — B029 Zoster without complications: Secondary | ICD-10-CM

## 2013-10-27 MED ORDER — VALACYCLOVIR HCL 1 G PO TABS: 1000.0000 mg | ORAL_TABLET | Freq: Three times a day (TID) | ORAL | Status: DC

## 2013-10-27 NOTE — Progress Notes (Signed)
[place] [address] Phone: [phone] Fax: [phone]  Patient ID: Julia [name] MRN: [id number], DOB: 16-Jan-1966, 48 y.o. Date of Encounter: 10/27/2013  Primary Physician:  Arnette Norris, MD   Chief Complaint: Rash   Subjective:   History of Present Illness:  Julia [name] is a 48 y.o. very pleasant female patient who presents with the following:  R chest: Shingles. The patient has an acute gout RIGHT painful sensation on her RIGHT chest, and she has a few vesicles that have broken out as well. She has had shingles once before. She states this feels this is exactly the same as when she has had shingles before. She has never been vaccinated for shingles.  She is not having any systemic symptoms or fever. She is eating and drinking fine.  Past Medical History, Surgical History, Social History, Family History, Problem List, Medications, and Allergies have been reviewed and updated if relevant.  Review of Systems:  GEN: No acute illnesses, no fevers, chills. GI: No n/v/d, eating normally Pulm: No SOB Interactive and getting along well at home.  Otherwise, ROS is as per the HPI.  Objective:   Physical Examination: BP 100/70  Pulse 84  Temp(Src) 98.1 F (36.7 C) (Oral)  Ht 5' 2.56" (1.589 m)  Wt 138 lb 8 oz (62.823 kg)  BMI 24.88 kg/m2   GEN: WDWN, NAD, Non-toxic, Alert & Oriented x 3 HEENT: Atraumatic, Normocephalic.  Ears and Nose: No external deformity. EXTR: No clubbing/cyanosis/edema NEURO: Normal gait.  PSYCH: Normally interactive. Conversant. Not depressed or anxious appearing.  Calm demeanor.   Skin: there approximately 4 or 5 vesicular lesions on the patient's RIGHT chest.  This portion of the physical examination was chaperoned by Hedy Camara, CMA.   Laboratory and Imaging Data:  Assessment & Plan:   Shingles outbreak  The history is very good for shingles, and this may be an early rash component. I would favor using  antivirals in this case. Less than 24 hours of symptoms.  New Prescriptions   VALACYCLOVIR (VALTREX) 1000 MG TABLET    Take 1 tablet (1,000 mg total) by mouth 3 (three) times daily.   Modified Medications   Modified Medication Previous Medication   HYDROCODONE-ACETAMINOPHEN (VICODIN) 5-500 MG PER TABLET HYDROcodone-acetaminophen (VICODIN) 5-500 MG per tablet      Take 1-2 tablets by mouth every 6 (six) hours as needed for pain.    Take 1-2 tablets by mouth every 6 (six) hours as needed for pain.   No orders of the defined types were placed in this encounter.   Follow-up: No Follow-up on file. Unless noted above, the patient is to follow-up if symptoms worsen. Red flags were reviewed with the patient.  Signed,  Maud Deed. Jashira Cotugno, MD, CAQ Sports Medicine   Discontinued Medications   HYDROCODONE-ACETAMINOPHEN (NORCO/VICODIN) 5-325 MG PER TABLET    TAKE 1 TABLET BY MOUTH EVERY 6 HOURS AS NEEDED FOR PAIN   TRAMADOL (ULTRAM) 50 MG TABLET    TAKE 1 TABLET BY MOUTH EVERY 8 HOURS AS NEEDED   VALACYCLOVIR (VALTREX) 1000 MG TABLET    TAKE 1 TABLET (1,000 MG TOTAL) BY MOUTH 3 (THREE) TIMES DAILY.   Current Medications at Discharge:   Medication List       This list is accurate as of: 10/27/13 11:59 PM.  Always use your most recent med list.               valACYclovir 1000 MG tablet  Commonly  known as:  VALTREX  Take 1 tablet (1,000 mg total) by mouth 3 (three) times daily.

## 2013-10-27 NOTE — Progress Notes (Signed)
Pre visit review using our clinic review tool, if applicable. No additional management support is needed unless otherwise documented below in the visit note. 

## 2013-10-28 ENCOUNTER — Telehealth: Payer: Self-pay | Admitting: Family Medicine

## 2013-10-28 MED ORDER — HYDROCODONE-ACETAMINOPHEN 5-500 MG PO TABS: 1.0000 | ORAL_TABLET | Freq: Four times a day (QID) | ORAL | Status: DC | PRN

## 2013-10-28 NOTE — Telephone Encounter (Signed)
Spoke to pt and informed her Rx is available for pick up at the front desk 

## 2013-10-28 NOTE — Telephone Encounter (Signed)
Spoke to pt who states that she is able to take vicodin

## 2013-10-28 NOTE — Telephone Encounter (Signed)
Confidential Office Message [place], [place] [address] p. [phone] f. [phone] To: [facility] (After Hours Triage) Fax: [phone] From: [facility] Date/ Time: 10/27/2013 8:20 PM Taken By: Osvaldo Human: [place]: not collected Patient: Julia, [name] DOB: [birth year] Phone: [phone] Reason for Call: States that she was dx/d with shingles, is having pain. Thinks something was supposed to be called in. Verified in Epic no pain meds were ordered. Pt to call back in am Regarding Appointment: Appt Date: Appt Time: Unknown Provider: Reason: Details: Confidential Outcome:

## 2013-10-28 NOTE — Telephone Encounter (Signed)
I did not see her for this but [facility] is out of the office. What can she take? I see that Norco is on her allergy list (hives).

## 2013-10-31 ENCOUNTER — Encounter: Payer: Self-pay | Admitting: Family Medicine

## 2013-10-31 ENCOUNTER — Telehealth: Payer: Self-pay | Admitting: Family Medicine

## 2013-10-31 MED ORDER — HYDROCODONE-ACETAMINOPHEN 5-325 MG PO TABS: ORAL_TABLET | ORAL | Status: DC

## 2013-10-31 NOTE — Telephone Encounter (Signed)
Orders from Date: Faxed To: Fax Number: [phone] Phone: [phone] Practice: [facility] (After Hours Triage) Practice: [phone] Patient: Julia [name], Julia [name] DOB: 1966/03/07 Order: RX FOR HYDROCODONE 5/500 WRITTEN HOWEVER DOSAGE NO LONGER AVAILABLE. PLEASE REWRITE RX FOR HYDROODONE 5/325 FOR PAIN MANAGEMENT AS CALLED IN TO PHARMACY ON 7/24. Ordering Provider: El[facility] (Family Practice) Nurse: Aurelio Jew, RN FAX [facility]  [address] [place], [place] [address]  P: [phone]  F: [phone]

## 2013-10-31 NOTE — Telephone Encounter (Signed)
Pt had ? On rx pharmacy told her they stop making mg on her pain meds.  She wanted to know what to do

## 2013-10-31 NOTE — Telephone Encounter (Deleted)
[place], [place] [address] p. [phone] f. [phone] To: [facility] (After Hours Triage) Fax: [phone] From: [facility] Date/ Time: 10/28/2013 8:51 PM Taken By: Osvaldo Human: [place]: not collected Patient: [name], Julia DOB: [birth year] Phone: [phone] Reason for Call: Medication Issue. PLEASE REWRITE RX FOR HYDROCODONE 5/500 TO HYDROCODONE 5/325 AS INITIAL DOSAGE IS NO LONGER AVAILABLE PER PHARMACY Regarding Appointment:

## 2013-10-31 NOTE — Telephone Encounter (Signed)
[place], [place] [address] p. [phone] f. [phone] To: [facility] (After Hours Triage) Fax: [phone] From: [facility] Date/ Time: 10/28/2013 8:51 PM Taken By: Osvaldo Human: [place]: not collected Patient: Julia, [name] DOB: 1965-04-20 Phone: [phone] Reason for Call: Medication Issue. PLEASE REWRITE RX FOR HYDROCODONE 5/500 TO HYDROCODONE 5/325 AS INITIAL DOSAGE IS NO LONGER

## 2013-10-31 NOTE — Telephone Encounter (Signed)
New rx printed

## 2013-10-31 NOTE — Telephone Encounter (Signed)
Spoke to pt and informed her Rx is available for pickup at the front desk. Pt advised she must bring old Rx in order to receive new one

## 2013-10-31 NOTE — Telephone Encounter (Signed)
Pt left v/m; pt tried to get hydrocodone apap 5-500 filled but was advised no longer manufactured and request new rx with different strength. Pt request cb today to pick up rx.

## 2013-10-31 NOTE — Telephone Encounter (Signed)
Please see 10/28/13 note.

## 2013-10-31 NOTE — Addendum Note (Signed)
Addended by: Lucille Passy on: 10/31/2013 09:39 AM   Modules accepted: Orders, Medications

## 2013-11-22 ENCOUNTER — Other Ambulatory Visit: Payer: Self-pay

## 2013-11-22 NOTE — Telephone Encounter (Signed)
Pt request refill hydrocodone apap for shingles.call when ready for pick up.

## 2013-11-30 ENCOUNTER — Other Ambulatory Visit: Payer: Self-pay

## 2013-11-30 NOTE — Telephone Encounter (Signed)
Pt left v/m requesting rx hydrocodone apap. Call when ready for pick up.  

## 2013-11-30 NOTE — Telephone Encounter (Signed)
Pt called to ck on status of hydrocodone rx; advised pt hydrocodone request was refused due to med given for acute pain with shingles. Pt said OK and ended call.

## 2014-03-01 ENCOUNTER — Telehealth: Payer: Self-pay | Admitting: Family Medicine

## 2014-03-01 ENCOUNTER — Encounter: Payer: Self-pay | Admitting: Internal Medicine

## 2014-03-01 ENCOUNTER — Ambulatory Visit (INDEPENDENT_AMBULATORY_CARE_PROVIDER_SITE_OTHER): Payer: Self-pay | Admitting: Internal Medicine

## 2014-03-01 VITALS — BP 120/72 | HR 84 | Temp 98.2°F | Wt 146.0 lb

## 2014-03-01 DIAGNOSIS — B029 Zoster without complications: Secondary | ICD-10-CM

## 2014-03-01 MED ORDER — VALACYCLOVIR HCL 1 G PO TABS: 1000.0000 mg | ORAL_TABLET | Freq: Three times a day (TID) | ORAL | Status: DC

## 2014-03-01 MED ORDER — HYDROCODONE-ACETAMINOPHEN 5-325 MG PO TABS: ORAL_TABLET | ORAL | Status: DC

## 2014-03-01 NOTE — Telephone Encounter (Signed)
Patient Information:  Caller Name: [place]  Phone: [phone]  Patient: Julia [name], Julia [name]  Gender: Female  DOB: 05/01/1965  Age: 48 Years  PCP: Elsie Stain Brigitte Pulse) [facility])  Pregnant: No  Office Follow Up:  Does the office need to follow up with this patient?: Yes  Instructions For The Office: Shingles  RN Note:  Patient calling regarding right sided blisters on torso and small area on back.  States "I know it's shingles, I've had it before."  Requesting consideration of Antiviral med and something for pain.  Requesting a call back.  Symptoms  Reason For Call & Symptoms: recurrent shingles  Reviewed Health History In EMR: Yes  Reviewed Medications In EMR: Yes  Reviewed Allergies In EMR: Yes  Reviewed Surgeries / Procedures: Yes  Date of Onset of Symptoms: 03/01/2014 OB / GYN:  LMP: Unknown  Guideline(s) Used:  Rash or Redness - Localized  Disposition Per Guideline:   See Today in Office  Reason For Disposition Reached:   Painful rash and has multiple small blisters grouped together in one area of body (i.e., dermatomal distribution or "band" or "stripe")  Advice Given:  N/A  Patient Refused Recommendation:  Patient Requests Prescription  Reuesting consideration of pain med and antiviral med.  states i will come by office and pick it up.

## 2014-03-01 NOTE — Patient Instructions (Signed)

## 2014-03-01 NOTE — Progress Notes (Signed)
Pre visit review using our clinic review tool, if applicable. No additional management support is needed unless otherwise documented below in the visit note. 

## 2014-03-01 NOTE — Telephone Encounter (Signed)
Put her on my 4:15 to verify it is shingles

## 2014-03-01 NOTE — Telephone Encounter (Signed)
Pt notified as instructed and pt will be at [facility] at 4:15.

## 2014-03-01 NOTE — Progress Notes (Signed)
Subjective:    Patient ID: Julia [name], female    DOB: Jul 06, 1965, 48 y.o.   MRN: [id number]  HPI  Pt presents to the clinic today with c/o a rash on the right side of her upper back, extending around to her abdomen and right breast. She noticed this yesterday. She know it is shingles. She has had 3 other shingles outbreaks in the same area in the last year. She has not tried anything OTC.  Review of Systems  History reviewed. No pertinent past medical history.  Current Outpatient Prescriptions  Medication Sig Dispense Refill  . HYDROcodone-acetaminophen (NORCO/VICODIN) 5-325 MG per tablet TAKE 1 TABLET BY MOUTH EVERY 6 HOURS AS NEEDED FOR PAIN 30 tablet 0  . valACYclovir (VALTREX) 1000 MG tablet Take 1 tablet (1,000 mg total) by mouth 3 (three) times daily. (Patient not taking: Reported on 03/01/2014) 21 tablet 0   No current facility-administered medications for this visit.    Allergies  Allergen Reactions  . Propoxyphene N-Acetaminophen Hives  . Nabumetone Rash  . Rofecoxib Rash    nightmares    Family History  Problem Relation Age of Onset  . Hypertension Mother   . Diabetes Mother     insulin dependent  . Diabetes Father   . Heart disease Father     CAD  . Hypertension Brother   . Kidney disease Brother     kidney failure/resolved    History   Social History  . Marital Status: Married    Spouse Name: N/A    Number of Children: 1  . Years of Education: N/A   Occupational History  . 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 Smoking status: Current Every Day Smoker -- 0.50 packs/day for 31 years    Types: Cigarettes  . Smokeless tobacco: Never Used  . Alcohol Use: No  . Drug Use: No  . Sexual Activity: Not on file   Other Topics Concern  . Not on file   Social History Narrative   One son     Constitutional: Denies fever, malaise, fatigue, headache or abrupt weight changes.  Skin: Pt reports rash. Denies or ulcercations.      No other specific complaints in a complete review of systems (except as listed in HPI above).     Objective:   Physical Exam  BP 120/72 mmHg  Pulse 84  Temp(Src) 98.2 F (36.8 C) (Oral)  Wt 146 lb (66.225 kg)  SpO2 97% Wt Readings from Last 3 Encounters:  03/01/14 146 lb (66.225 kg)  10/27/13 138 lb 8 oz (62.823 kg)  04/14/13 139 lb 8 oz (63.277 kg)    General: Appears her stated age, well developed, well nourished in NAD. Skin: erythematous, blanchable lesions noted in a dermatome pattern starting at the midline back extending around to the right abdomen/chest. Cardiovascular: Normal rate and rhythm. S1,S2 noted.  No murmur, rubs or gallops noted.  Pulmonary/Chest: Normal effort and positive vesicular breath sounds. No respiratory distress. No wheezes, rales or ronchi noted.    BMET    Component Value Date/Time   NA 139 05/07/2011 0939   K 4.2 05/07/2011 0939   CL 106 05/07/2011 0939   CO2 26 05/07/2011 0939   GLUCOSE 105* 05/07/2011 0939   BUN 13 05/07/2011 0939   CREATININE 0.8 05/07/2011 0939   CALCIUM 9.6 05/07/2011 0939   GFRNONAA 98 06/05/2006 1117   GFRAA 119 06/05/2006 1117    Lipid Panel  Component Value Date/Time   CHOL 226* 05/07/2011 0939   TRIG 249.0* 05/07/2011 0939   HDL 40.90 05/07/2011 0939   CHOLHDL 6 05/07/2011 0939   VLDL 49.8* 05/07/2011 0939    CBC    Component Value Date/Time   WBC 9.8 06/05/2006 1117   RBC 4.55 06/05/2006 1117   HGB 14.8 06/05/2006 1117   HCT 42.8 06/05/2006 1117   PLT 267 06/05/2006 1117   MCV 94.0 06/05/2006 1117   MCHC 34.7 06/05/2006 1117   RDW 11.4* 06/05/2006 1117   MONOABS 0.5 06/05/2006 1117   EOSABS 0.3 06/05/2006 1117   BASOSABS 0.1 06/05/2006 1117    Hgb A1C Lab Results  Component Value Date   HGBA1C 5.5 12/15/2007         Assessment & Plan:   Shingles:  Recurrent Valtrex TID  X 10 days RX for Norco  RTC as needed or if symptoms persist or worsen

## 2014-10-11 ENCOUNTER — Telehealth: Payer: Self-pay | Admitting: Family Medicine

## 2014-10-11 ENCOUNTER — Ambulatory Visit (INDEPENDENT_AMBULATORY_CARE_PROVIDER_SITE_OTHER): Payer: 59 | Admitting: Family Medicine

## 2014-10-11 ENCOUNTER — Encounter: Payer: Self-pay | Admitting: Family Medicine

## 2014-10-11 VITALS — BP 120/78 | HR 80 | Temp 98.6°F | Wt 156.2 lb

## 2014-10-11 DIAGNOSIS — M722 Plantar fascial fibromatosis: Secondary | ICD-10-CM

## 2014-10-11 DIAGNOSIS — L739 Follicular disorder, unspecified: Secondary | ICD-10-CM

## 2014-10-11 MED ORDER — IBUPROFEN 600 MG PO TABS: 600.0000 mg | ORAL_TABLET | Freq: Three times a day (TID) | ORAL | Status: DC | PRN

## 2014-10-11 MED ORDER — CEPHALEXIN 500 MG PO CAPS: 500.0000 mg | ORAL_CAPSULE | Freq: Three times a day (TID) | ORAL | Status: DC

## 2014-10-11 NOTE — Progress Notes (Signed)
Pre visit review using our clinic review tool, if applicable. No additional management support is needed unless otherwise documented below in the visit note.  R heel pain.  Pain for about 2 months.  Pain with walking, no pain at rest.  Worst pain with 1st step in the AM.  Pain in the arch.  She can tolerate ibuprofen, verified with patient.    Niece died in 2022-09-18, [place].  Condolences offered.    Bumps on the back of her head.  For about 2 months.  Painful.  Not draining anything that she knows of.  Used neosporin, w/o help.  No FCNAVD.  Doesn't feel typical for prev shingles.   Meds, vitals, and allergies reviewed.   ROS: See HPI.  Otherwise, noncontributory.  nad ncat but small area of irritated skin on the posterior scalp, with 2 lesions noted.  No ulceration, each lesion is a few mm across, associated with a hair follice R foot with normal inspection.  Achilles not ttp, ankle stable Normal DP pulse but plantar fascia origin ttp

## 2014-10-11 NOTE — Patient Instructions (Signed)
Check with your insurance to see if they will cover the shingles shot. Start the antibiotics and let me know if the spots on your scalp don't heal over.  Stretch your foot before you get out of bed.  Stop wearing flip flops.  Wear shoes with good arch support.  Ice your foot, roll it on a cold soda can.  Ibuprofen 600mg  3 times a day with food.  If not better, then I want you to call about seeing Dr. Lorelei Pont.  Take care.

## 2014-10-11 NOTE — Telephone Encounter (Signed)
[place] with me of ok with Dr. Damita Dunnings.  It looks like I have not seen her in almost 3 years but from what I recall, she is a very nice lady.

## 2014-10-11 NOTE — Telephone Encounter (Signed)
Pt wanted to know if she could switch providers From dr Deborra Medina to dr Damita Dunnings Is this ok

## 2014-10-11 NOTE — Telephone Encounter (Signed)
Left message asking pt to call office. Please let her know its ok to switch provders need to schedule a cpx with dr Damita Dunnings

## 2014-10-11 NOTE — Telephone Encounter (Signed)
Pt aware per carrie

## 2014-10-11 NOTE — Telephone Encounter (Signed)
Okay with me.  Please schedule the next CPE with me.  Thanks.

## 2014-10-12 DIAGNOSIS — M722 Plantar fascial fibromatosis: Secondary | ICD-10-CM | POA: Insufficient documentation

## 2014-10-12 DIAGNOSIS — L739 Follicular disorder, unspecified: Secondary | ICD-10-CM | POA: Insufficient documentation

## 2014-10-12 NOTE — Assessment & Plan Note (Signed)
Likely start keflex and f/u prn.  Not in distribution for shingles (crosses midline, not dermatomal).

## 2014-10-12 NOTE — Assessment & Plan Note (Signed)
Anatomy and plan d/w pt.   Stretch foot before getting out of bed.  Stop wearing flip flops. Wear shoes with good arch support.  Ice your foot, roll it on a cold soda can.  Ibuprofen 600mg  3 times a day with food.  If not better, then I want her to call about seeing Dr. Lorelei Pont.  She agrees.

## 2014-11-23 ENCOUNTER — Other Ambulatory Visit: Payer: Self-pay

## 2014-11-23 ENCOUNTER — Encounter: Payer: Self-pay | Admitting: Family Medicine

## 2014-11-23 ENCOUNTER — Ambulatory Visit (INDEPENDENT_AMBULATORY_CARE_PROVIDER_SITE_OTHER): Payer: 59 | Admitting: Family Medicine

## 2014-11-23 VITALS — BP 122/70 | HR 81 | Temp 98.4°F | Wt 157.2 lb

## 2014-11-23 DIAGNOSIS — N342 Other urethritis: Secondary | ICD-10-CM | POA: Diagnosis not present

## 2014-11-23 MED ORDER — IBUPROFEN 600 MG PO TABS: 600.0000 mg | ORAL_TABLET | Freq: Three times a day (TID) | ORAL | Status: DC | PRN

## 2014-11-23 MED ORDER — CEPHALEXIN 500 MG PO CAPS: 500.0000 mg | ORAL_CAPSULE | Freq: Three times a day (TID) | ORAL | Status: DC

## 2014-11-23 NOTE — Progress Notes (Signed)
Pre visit review using our clinic review tool, if applicable. No additional management support is needed unless otherwise documented below in the visit note.  Lump in vaginal area.  Noted a few days ago.  Same size for the last few days.  Sore to the touch.  L sided.  No FCNAVD.  LMP was ~10 years ago.  No discharge.  No sx like this prev.  No VB.    Meds, vitals, and allergies reviewed.   ROS: See HPI.  Otherwise, noncontributory.  nad Chaperoned exam.   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  No spreading erythema.  No vaginal discharge.

## 2014-11-23 NOTE — Patient Instructions (Signed)
Take ibuprofen with food, use warm compresses a few times a day, and start the antibiotics today.  If not improving, then let me know.  Take care. Glad to see you.

## 2014-11-23 NOTE — Telephone Encounter (Signed)
Pt left v/m; pt was seen earlier today and request refill ibuprofen to [facility]; [facility] pt got # [facility] on 10/11/14 and again on 10/30/14. AVS for 11/23/14 instructed pt to take ibuprofen with food. Per protocol refill done and pt notified.

## 2014-11-24 DIAGNOSIS — N342 Other urethritis: Secondary | ICD-10-CM | POA: Insufficient documentation

## 2014-11-24 NOTE — Assessment & Plan Note (Signed)
Doesn't feel likely an abscess or cyst.  Warm compresses, start keflex, fu prn.  Should resolve.  She agrees.  Okay for outpatient f/u.

## 2014-12-19 ENCOUNTER — Encounter: Payer: 59 | Admitting: Family Medicine

## 2014-12-21 ENCOUNTER — Other Ambulatory Visit: Payer: Self-pay | Admitting: *Deleted

## 2014-12-21 ENCOUNTER — Ambulatory Visit (INDEPENDENT_AMBULATORY_CARE_PROVIDER_SITE_OTHER): Payer: 59 | Admitting: Family Medicine

## 2014-12-21 ENCOUNTER — Encounter: Payer: Self-pay | Admitting: Family Medicine

## 2014-12-21 VITALS — BP 98/52 | HR 92 | Temp 98.2°F | Ht 63.0 in | Wt 156.0 lb

## 2014-12-21 DIAGNOSIS — Z23 Encounter for immunization: Secondary | ICD-10-CM

## 2014-12-21 DIAGNOSIS — Z0000 Encounter for general adult medical examination without abnormal findings: Secondary | ICD-10-CM

## 2014-12-21 DIAGNOSIS — R739 Hyperglycemia, unspecified: Secondary | ICD-10-CM

## 2014-12-21 DIAGNOSIS — Z1211 Encounter for screening for malignant neoplasm of colon: Secondary | ICD-10-CM

## 2014-12-21 DIAGNOSIS — Z7189 Other specified counseling: Secondary | ICD-10-CM

## 2014-12-21 DIAGNOSIS — N342 Other urethritis: Secondary | ICD-10-CM

## 2014-12-21 LAB — BASIC METABOLIC PANEL
BUN: 11 mg/dL (ref 6–23)
CO2: 28 mEq/L (ref 19–32)
Calcium: 9.7 mg/dL (ref 8.4–10.5)
Chloride: 103 mEq/L (ref 96–112)
Creatinine, Ser: 0.76 mg/dL (ref 0.40–1.20)
GFR: 85.46 mL/min (ref >60.00)
Glucose, Bld: 98 mg/dL (ref 70–99)
Potassium: 3.9 mEq/L (ref 3.5–5.1)
Sodium: 138 mEq/L (ref 135–145)

## 2014-12-21 LAB — LIPID PANEL
Cholesterol: 238 mg/dL — ABNORMAL HIGH (ref 0–200)
HDL: 34.2 mg/dL — ABNORMAL LOW (ref >39.00)
Total CHOL/HDL Ratio: 7
Triglycerides: 601 mg/dL — ABNORMAL HIGH (ref 0.0–149.0)

## 2014-12-21 LAB — LDL CHOLESTEROL, DIRECT: Direct LDL: 86 mg/dL

## 2014-12-21 MED ORDER — CEPHALEXIN 500 MG PO CAPS: 500.0000 mg | ORAL_CAPSULE | Freq: Three times a day (TID) | ORAL | Status: DC

## 2014-12-21 MED ORDER — IBUPROFEN 600 MG PO TABS: 600.0000 mg | ORAL_TABLET | Freq: Three times a day (TID) | ORAL | Status: DC | PRN

## 2014-12-21 NOTE — Progress Notes (Signed)
Pre visit review using our clinic review tool, if applicable. No additional management support is needed unless otherwise documented below in the visit note.  CPE- See plan.  Routine anticipatory guidance given to patient.  See health maintenance. Tetanus 2010 PNA and shingles shot not due.  Flu 2016 She'll call about a mammogram.  DXA not due.   D/w patient MV:HQIONGE for colon cancer screening, including IFOB vs. colonoscopy.  Risks and benefits of both were discussed and patient voiced understanding.  Pt elects XBM:WUXL.   Living will d/w pt.  Husband designated if patient were incapacitated.   Pap deferred.  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  She improved with keflex but the sx returned thereafter.  D/w pt about gyn eval and she agrees.   No menses since age ~50.   HIV neg per patient report ~2014.   Due for f/u labs.  FH DM2 noted.  Diet and exercise, smoking d/w pt.   PMH and SH reviewed  Meds, vitals, and allergies reviewed.   ROS: See HPI.  Otherwise negative.    GEN: nad, alert and oriented HEENT: mucous membranes moist NECK: supple w/o LA CV: rrr. PULM: ctab, no inc wob ABD: soft, +bs EXT: no edema SKIN: no acute rash

## 2014-12-21 NOTE — Telephone Encounter (Signed)
Sent. Thanks.   

## 2014-12-21 NOTE — Telephone Encounter (Signed)
Ok to refill 

## 2014-12-21 NOTE — Patient Instructions (Addendum)
You can call for a mammogram at: [facility] at [facility].  [place] to the lab on the way out.  We'll contact you with your lab report (stool cards and blood draw). Rosaria Ferries will call about your referral. Take care.  Glad to see you.

## 2014-12-22 DIAGNOSIS — Z7189 Other specified counseling: Secondary | ICD-10-CM | POA: Insufficient documentation

## 2014-12-22 NOTE — Assessment & Plan Note (Signed)
Restart keflex and refer.  She agrees.

## 2014-12-22 NOTE — Assessment & Plan Note (Signed)
Routine anticipatory guidance given to patient.  See health maintenance. Tetanus 2010 PNA and shingles shot not due.  Flu 2016 She'll call about a mammogram.  DXA not due.   D/w patient [id number] for colon cancer screening, including IFOB vs. colonoscopy.  Risks and benefits of both were discussed and patient voiced understanding.  Pt elects ZRA:QTMA.   Living will d/w pt.  Husband designated if patient were incapacitated.   Pap deferred.  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  She improved with keflex but the sx returned thereafter.  D/w pt about gyn eval and she agrees.  No discharge.   No menses since age ~20.   HIV neg per patient report ~2014.   Due for f/u labs.  FH DM2 noted.  Diet and exercise, smoking d/w pt. o

## 2014-12-25 ENCOUNTER — Other Ambulatory Visit: Payer: Self-pay | Admitting: Obstetrics & Gynecology

## 2014-12-25 ENCOUNTER — Other Ambulatory Visit: Payer: Self-pay | Admitting: Family Medicine

## 2014-12-25 DIAGNOSIS — E782 Mixed hyperlipidemia: Secondary | ICD-10-CM

## 2014-12-26 ENCOUNTER — Encounter: Payer: Self-pay | Admitting: Family Medicine

## 2014-12-29 ENCOUNTER — Other Ambulatory Visit (INDEPENDENT_AMBULATORY_CARE_PROVIDER_SITE_OTHER): Payer: 59

## 2014-12-29 DIAGNOSIS — E782 Mixed hyperlipidemia: Secondary | ICD-10-CM | POA: Diagnosis not present

## 2014-12-29 LAB — LIPID PANEL
Cholesterol: 228 mg/dL — ABNORMAL HIGH (ref 0–200)
HDL: 29.8 mg/dL — ABNORMAL LOW (ref >39.00)
Total CHOL/HDL Ratio: 8
Triglycerides: 557 mg/dL — ABNORMAL HIGH (ref 0.0–149.0)

## 2014-12-29 LAB — LDL CHOLESTEROL, DIRECT: Direct LDL: 98 mg/dL

## 2015-01-01 ENCOUNTER — Other Ambulatory Visit: Payer: Self-pay | Admitting: Family Medicine

## 2015-01-01 DIAGNOSIS — E782 Mixed hyperlipidemia: Secondary | ICD-10-CM

## 2015-01-03 ENCOUNTER — Other Ambulatory Visit: Payer: Self-pay | Admitting: Family Medicine

## 2015-01-03 ENCOUNTER — Telehealth: Payer: Self-pay | Admitting: *Deleted

## 2015-01-03 MED ORDER — FENOFIBRATE 145 MG PO TABS: 145.0000 mg | ORAL_TABLET | Freq: Every day | ORAL | Status: DC

## 2015-01-03 NOTE — Telephone Encounter (Signed)
Received a fax from [place] showing that insurance will not cover Fenofibrate 145 mg. Note shows that insurance will cover 45 mg or 160 mg. Please advise. Form is on your desk.

## 2015-01-04 MED ORDER — FENOFIBRATE 160 MG PO TABS: 160.0000 mg | ORAL_TABLET | Freq: Every day | ORAL | Status: DC

## 2015-01-04 NOTE — Telephone Encounter (Signed)
Left pharmacy know to cancel the 145mg  Rx and to fill the 160mg 

## 2015-01-04 NOTE — Telephone Encounter (Signed)
Changed to 160.  Sent.  Thanks.

## 2015-01-10 ENCOUNTER — Other Ambulatory Visit: Payer: Self-pay | Admitting: Family Medicine

## 2015-01-10 NOTE — Telephone Encounter (Signed)
Received refill request electronically from pharmacy Last refill 12/21/14 #30, last office visit same date See allergy/contraindication Is it okay to refill medication?

## 2015-01-11 NOTE — Telephone Encounter (Signed)
Left voicemail requesting pt to call office 

## 2015-01-11 NOTE — Telephone Encounter (Signed)
Sent. How is she doing?  Please get update.   What did she eventually hear from the gyn clinic?

## 2015-01-11 NOTE — Telephone Encounter (Signed)
Pt notified Rx sent to pharmacy, pt said that she f/u with gyn and they told her she had some pre-cancerous cells and needs a referral to oncology and that because of her insurance Dr. Damita Dunnings will have to put the referral in but they are sending you a referral form (and possibly their OV note, pt not sure), pt said be on the look out for it

## 2015-01-12 ENCOUNTER — Telehealth: Payer: Self-pay

## 2015-01-12 NOTE — Telephone Encounter (Signed)
I haven't gotten anything from that clinic.  Please call them to see what is going on.  Thanks.   [facility], [place], [place] [address] [place], [place] [address] Phone: [phone]

## 2015-01-12 NOTE — Telephone Encounter (Signed)
Spoke to Santiago Glad who states it was sent to office fax on 01/11/15. Advised Santiago Glad Dr Damita Dunnings has not yet received form and requested she send it to [facility] direct fax

## 2015-01-12 NOTE — Telephone Encounter (Signed)
Pt left v/m; pt cannot afford $55.00 per month for fenofibrate 160 mg. Pt wants to know if generic lipitor could be substituted; generic lipitor is $10.00/mth. Pt request [facility] [place] [facility].

## 2015-01-13 ENCOUNTER — Telehealth: Payer: Self-pay | Admitting: Family Medicine

## 2015-01-13 DIAGNOSIS — D071 Carcinoma in situ of vulva: Secondary | ICD-10-CM | POA: Insufficient documentation

## 2015-01-13 MED ORDER — ATORVASTATIN CALCIUM 10 MG PO TABS: 10.0000 mg | ORAL_TABLET | Freq: Every day | ORAL | Status: DC

## 2015-01-13 NOTE — Telephone Encounter (Addendum)
Records reviewed from gyn clinic.   Notes initially state that they will refer to gyn onc but then they stated on the fax they will not refer.  I put it in.  Thanks.

## 2015-01-13 NOTE — Telephone Encounter (Signed)
We can try it.   The issue is how much it brings down her TG.  We'll have to see on the f/u lipid panel.  rx sent.   If she has aches on the medicine, then stop it and notify me.  Thanks.

## 2015-01-15 NOTE — Telephone Encounter (Signed)
Patient notified as instructed by telephone and verbalized understanding. 

## 2015-01-15 NOTE — Telephone Encounter (Signed)
Left message at home number and cell voicemail to call back.

## 2015-01-15 NOTE — Telephone Encounter (Signed)
Left message at home number to call back. 

## 2015-01-15 NOTE — Telephone Encounter (Signed)
Patient notified as instructed by telephone and verbalized understanding. Advised patient that the referral coordinator will be in touch with her to get this referral set up.

## 2015-01-23 ENCOUNTER — Other Ambulatory Visit: Payer: Self-pay | Admitting: Family Medicine

## 2015-01-24 NOTE — Telephone Encounter (Signed)
Sent. Thanks.   

## 2015-01-26 ENCOUNTER — Encounter: Payer: Self-pay | Admitting: Gynecologic Oncology

## 2015-01-26 ENCOUNTER — Ambulatory Visit: Payer: 59 | Attending: Gynecologic Oncology | Admitting: Gynecologic Oncology

## 2015-01-26 VITALS — BP 133/68 | HR 76 | Temp 98.1°F | Resp 20 | Ht 63.0 in | Wt 155.7 lb

## 2015-01-26 DIAGNOSIS — Z720 Tobacco use: Secondary | ICD-10-CM

## 2015-01-26 DIAGNOSIS — D071 Carcinoma in situ of vulva: Secondary | ICD-10-CM

## 2015-01-26 NOTE — Patient Instructions (Signed)
Plan for surgery with Dr. Denman George on November 8 at the [facility].  We will schedule you for a wide local excision of the left vulva.  You will receive a phone call from the [facility] one to two days before your procedure to discuss the instructions.

## 2015-01-26 NOTE — Progress Notes (Signed)
Consult Note: Gyn-Onc  Consult was requested by Dr. Lynnette [name] for the evaluation of Julia [name] 49 y.o. 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 VIN III    New patient    Assessment/Plan:  [name]  is a 49 y.o.  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   We discussed the etiology of this condition (likely a combination of HPV disease and/or tobacco exposure). I discussed that this disease is a precursor for invasive malignancy and sometimes coexist with invasive malignancy. I recommend intervention with surgical excision. I discussed that surgical excision in this area may be associated with sexual dysfunction due to the relative proximity to the clitoris. We discussed postoperative wound care. I discussed the high likelihood of wound separation particularly in a chronic smoker. I discussed postoperative restrictions.  We will plan for a wide local excision of the left vulva. She has history of chronic pain. I discussed with the patient that I will prescribe 6 weeks of postoperative analgesia but after that period of time she will need to be evaluated by chronic pain professional if she has ongoing analgesic requirements. I discussed that her history of tobacco abuse puts her at increased risk for surgical complications and failure of wound healing. She was encouraged to quit.   HPI: Julia [name] is a 49 year old woman who is seen in consultation at the request of Dr. Lynnette [name] for VIN 3. The patient has a history of vulvar pain for a proximally 5 months. She experiences discomfort with micturition. She denies pruritus. She's seen and evaluated by Dr. Lynnette [name] in September 2016. 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   She is a lifetime smoker with a 35 year pack year history. She is no history of abnormal  Pap smears. She reports she's been regular with her Pap smear follow-up.   Current Meds:  Outpatient Encounter Prescriptions as of 01/26/2015  Medication Sig  . atorvastatin (LIPITOR) 10 MG tablet Take 1 tablet (10 mg total) by mouth daily.  Marland Kitchen ibuprofen (ADVIL,MOTRIN) 600 MG tablet TAKE ONE TABLET BY MOUTH EVERY 8 HOURS AS NEEDED   No facility-administered encounter medications on file as of 01/26/2015.    Allergy:  Allergies  Allergen Reactions  . Propoxyphene N-Acetaminophen Hives  . Nabumetone Rash    She can tolerate ibuprofen w/o difficulty  . Rofecoxib Rash    nightmares    Social Hx:   Social History   Social History  . Marital Status: Married    Spouse Name: N/A  . Number of Children: 1  . Years of Education: N/A   Occupational History  . Cashier at [employer] History Main Topics  . Smoking status: Current Every Day Smoker -- 1.00 packs/day for 31 years    Types: Cigarettes  . Smokeless tobacco: Never Used  . Alcohol Use: No  . Drug Use: No  . Sexual Activity: Not on file   Other Topics Concern  . Not on file   Social History Narrative   One son   Married 2000 (had been together since 1984)    Past Surgical Hx:  Past Surgical History  Procedure Laterality Date  . Cholecystectomy      Past Medical Hx:  Past Medical History  Diagnosis Date  . Smoking     Past  Gynecological History:  Denies abn paps No LMP recorded. Patient is postmenopausal.  Family Hx:  Family History  Problem Relation Age of Onset  . Hypertension Mother   . Diabetes Mother     insulin dependent  . Heart disease Mother   . Diabetes Father   . Heart disease Father     CAD  . Hypertension Brother   . Kidney disease Brother     kidney failure/resolved  . Colon cancer Maternal Aunt     dx'd at ~62  . Breast cancer Neg Hx     Review of Systems:  Constitutional  Feels well,    ENT Normal appearing ears and nares bilaterally Skin/Breast  No  rash, sores, jaundice, itching, dryness Cardiovascular  No chest pain, shortness of breath, or edema  Pulmonary  No cough or wheeze.  Gastro Intestinal  No nausea, vomitting, or diarrhoea. No bright red blood per rectum, no abdominal pain, change in bowel movement, or constipation.  Genito Urinary  No frequency, urgency, dysuria,  Musculo Skeletal  No myalgia, arthralgia, joint swelling or pain  Neurologic  No weakness, numbness, change in gait,  Psychology  No depression, anxiety, insomnia.   Vitals:  Blood pressure 133/68, pulse 76, temperature 98.1 F (36.7 C), temperature source Oral, resp. rate 20, height 5\' 3"  (1.6 m), weight 155 lb 11.2 oz (70.625 kg), SpO2 98 %.  Physical Exam: WD in NAD Neck  Supple NROM, without any enlargements.  Lymph Node Survey No cervical supraclavicular or inguinal adenopathy Cardiovascular  Pulse normal rate, regularity and rhythm. S1 and S2 normal.  Lungs  Clear to auscultation bilateraly, without wheezes/crackles/rhonchi. Good air movement.  Skin  No rash/lesions/breakdown  Psychiatry  Alert and oriented to person, place, and time  Abdomen  Normoactive bowel sounds, abdomen soft, non-tender and thinhout evidence of hernia.  Back No CVA tenderness Genito Urinary  Vulva/vagina: Normal external female genitalia. A 1 cm area of leukoplakia is appreciated in the left mid anterior labia minora. It is slightly nodular but is mobile and not fixed to underlying tissues. It is more than 2 cm away from the midline. Its greater than 2 cm away from the urethral meatus. 4% acetic acid was applied to the entirety of the vulva. No additional lesions were appreciated on the vulva.. Rectal  deferred Extremities  No bilateral cyanosis, clubbing or edema.   Donaciano Eva, MD  01/26/2015, 5:05 PM

## 2015-01-29 ENCOUNTER — Encounter (HOSPITAL_COMMUNITY): Payer: Self-pay | Admitting: *Deleted

## 2015-01-30 ENCOUNTER — Ambulatory Visit (HOSPITAL_COMMUNITY): Payer: 59 | Admitting: Registered Nurse

## 2015-01-30 ENCOUNTER — Ambulatory Visit (HOSPITAL_COMMUNITY)
Admission: RE | Admit: 2015-01-30 | Discharge: 2015-01-30 | Disposition: A | Discharge status: Home / Self Care | Payer: 59 | Source: Ambulatory Visit | Attending: Gynecologic Oncology | Admitting: Gynecologic Oncology

## 2015-01-30 ENCOUNTER — Encounter (HOSPITAL_COMMUNITY): Payer: Self-pay

## 2015-01-30 ENCOUNTER — Encounter (HOSPITAL_COMMUNITY)
Admission: RE | Disposition: A | Discharge status: Home / Self Care | Payer: Self-pay | Source: Ambulatory Visit | Attending: Gynecologic Oncology

## 2015-01-30 DIAGNOSIS — D071 Carcinoma in situ of vulva: Secondary | ICD-10-CM

## 2015-01-30 DIAGNOSIS — N959 Unspecified menopausal and perimenopausal disorder: Secondary | ICD-10-CM | POA: Insufficient documentation

## 2015-01-30 DIAGNOSIS — F17210 Nicotine dependence, cigarettes, uncomplicated: Secondary | ICD-10-CM | POA: Diagnosis not present

## 2015-01-30 DIAGNOSIS — Z79899 Other long term (current) drug therapy: Secondary | ICD-10-CM | POA: Diagnosis not present

## 2015-01-30 DIAGNOSIS — Z791 Long term (current) use of non-steroidal anti-inflammatories (NSAID): Secondary | ICD-10-CM | POA: Insufficient documentation

## 2015-01-30 DIAGNOSIS — G8929 Other chronic pain: Secondary | ICD-10-CM | POA: Insufficient documentation

## 2015-01-30 HISTORY — PX: VULVECTOMY: SHX1086

## 2015-01-30 HISTORY — DX: Gastro-esophageal reflux disease without esophagitis: K21.9

## 2015-01-30 LAB — CBC
HCT: 42.7 % (ref 36.0–46.0)
Hemoglobin: 14.8 g/dL (ref 12.0–15.0)
MCH: 32.4 pg (ref 26.0–34.0)
MCHC: 34.7 g/dL (ref 30.0–36.0)
MCV: 93.4 fL (ref 78.0–100.0)
Platelets: 218 10*3/uL (ref 150–400)
RBC: 4.57 MIL/uL (ref 3.87–5.11)
RDW: 12.8 % (ref 11.5–15.5)
WBC: 9.2 10*3/uL (ref 4.0–10.5)

## 2015-01-30 SURGERY — WIDE EXCISION VULVECTOMY
Anesthesia: General

## 2015-01-30 MED ORDER — MIDAZOLAM HCL 2 MG/2ML IJ SOLN
INTRAMUSCULAR | Status: AC
  Filled 2015-01-30: qty 4

## 2015-01-30 MED ORDER — DEXAMETHASONE SODIUM PHOSPHATE 10 MG/ML IJ SOLN
INTRAMUSCULAR | Status: DC | PRN
  Administered 2015-01-30: 10 mg via INTRAVENOUS

## 2015-01-30 MED ORDER — PROPOFOL 10 MG/ML IV BOLUS
INTRAVENOUS | Status: AC
  Filled 2015-01-30: qty 20

## 2015-01-30 MED ORDER — FENTANYL CITRATE (PF) 250 MCG/5ML IJ SOLN
INTRAMUSCULAR | Status: AC
  Filled 2015-01-30: qty 25

## 2015-01-30 MED ORDER — EPHEDRINE SULFATE 50 MG/ML IJ SOLN
INTRAMUSCULAR | Status: DC | PRN
  Administered 2015-01-30: 5 mg via INTRAVENOUS

## 2015-01-30 MED ORDER — GLYCOPYRROLATE 0.2 MG/ML IJ SOLN
INTRAMUSCULAR | Status: AC
  Filled 2015-01-30: qty 1

## 2015-01-30 MED ORDER — LACTATED RINGERS IV SOLN: INTRAVENOUS | Status: DC

## 2015-01-30 MED ORDER — LACTATED RINGERS IV SOLN
INTRAVENOUS | Status: DC
Start: 2015-01-30 — End: 2015-01-30
  Administered 2015-01-30: 1000 mL via INTRAVENOUS

## 2015-01-30 MED ORDER — HYDROMORPHONE HCL 1 MG/ML IJ SOLN
INTRAMUSCULAR | Status: AC
  Filled 2015-01-30: qty 1

## 2015-01-30 MED ORDER — MIDAZOLAM HCL 5 MG/5ML IJ SOLN
INTRAMUSCULAR | Status: DC | PRN
  Administered 2015-01-30: 2 mg via INTRAVENOUS

## 2015-01-30 MED ORDER — FENTANYL CITRATE (PF) 100 MCG/2ML IJ SOLN
INTRAMUSCULAR | Status: DC | PRN
  Administered 2015-01-30 (×2): 50 ug via INTRAVENOUS

## 2015-01-30 MED ORDER — LIDOCAINE HCL 1 % IJ SOLN
INTRAMUSCULAR | Status: DC | PRN
  Administered 2015-01-30: 7 mL

## 2015-01-30 MED ORDER — DOCUSATE SODIUM 100 MG PO CAPS: 100.0000 mg | ORAL_CAPSULE | Freq: Two times a day (BID) | ORAL | Status: DC

## 2015-01-30 MED ORDER — LIDOCAINE HCL 1 % IJ SOLN
INTRAMUSCULAR | Status: AC
  Filled 2015-01-30: qty 20

## 2015-01-30 MED ORDER — ONDANSETRON HCL 4 MG/2ML IJ SOLN
INTRAMUSCULAR | Status: AC
  Filled 2015-01-30: qty 2

## 2015-01-30 MED ORDER — LIDOCAINE HCL (CARDIAC) 20 MG/ML IV SOLN
INTRAVENOUS | Status: DC | PRN
  Administered 2015-01-30: 75 mg via INTRAVENOUS

## 2015-01-30 MED ORDER — PROMETHAZINE HCL 25 MG/ML IJ SOLN: 6.2500 mg | INTRAMUSCULAR | Status: DC | PRN

## 2015-01-30 MED ORDER — OXYCODONE-ACETAMINOPHEN 5-325 MG PO TABS: 1.0000 | ORAL_TABLET | Freq: Four times a day (QID) | ORAL | Status: DC | PRN

## 2015-01-30 MED ORDER — ACETIC ACID 4% SOLUTION
1.0000 "application " | Freq: Once | Status: DC
  Filled 2015-01-30: qty 240

## 2015-01-30 MED ORDER — ONDANSETRON HCL 4 MG/2ML IJ SOLN
INTRAMUSCULAR | Status: DC | PRN
  Administered 2015-01-30: 4 mg via INTRAVENOUS

## 2015-01-30 MED ORDER — MEPERIDINE HCL 50 MG/ML IJ SOLN: 6.2500 mg | INTRAMUSCULAR | Status: DC | PRN

## 2015-01-30 MED ORDER — HYDROMORPHONE HCL 1 MG/ML IJ SOLN
0.2500 mg | INTRAMUSCULAR | Status: DC | PRN
  Administered 2015-01-30 (×4): 0.5 mg via INTRAVENOUS

## 2015-01-30 MED ORDER — FENTANYL CITRATE (PF) 100 MCG/2ML IJ SOLN: 25.0000 ug | INTRAMUSCULAR | Status: DC | PRN

## 2015-01-30 MED ORDER — OXYCODONE-ACETAMINOPHEN 5-325 MG PO TABS
1.0000 | ORAL_TABLET | Freq: Once | ORAL | Status: AC
  Administered 2015-01-30: 1 via ORAL
  Filled 2015-01-30: qty 1

## 2015-01-30 MED ORDER — ACETIC ACID 5 % SOLN
Freq: Once | Status: DC
  Filled 2015-01-30: qty 500

## 2015-01-30 MED ORDER — ACETIC ACID 5 % SOLN
Status: DC | PRN
  Administered 2015-01-30: 1 via TOPICAL

## 2015-01-30 MED ORDER — PROPOFOL 10 MG/ML IV BOLUS
INTRAVENOUS | Status: DC | PRN
  Administered 2015-01-30: 170 mg via INTRAVENOUS

## 2015-01-30 MED ORDER — LIDOCAINE HCL (CARDIAC) 20 MG/ML IV SOLN
INTRAVENOUS | Status: AC
  Filled 2015-01-30: qty 5

## 2015-01-30 SURGICAL SUPPLY — 24 items
BLADE SURG 15 STRL LF DISP TIS (BLADE) ×2 IMPLANT
BLADE SURG 15 STRL SS (BLADE) ×4
COVER SURGICAL LIGHT HANDLE (MISCELLANEOUS) ×2 IMPLANT
DRAPE SURG IRRIG POUCH 19X23 (DRAPES) ×2 IMPLANT
GAUZE SPONGE 4X4 12PLY STRL (GAUZE/BANDAGES/DRESSINGS) ×2 IMPLANT
GAUZE SPONGE 4X4 16PLY XRAY LF (GAUZE/BANDAGES/DRESSINGS) ×3 IMPLANT
GLOVE BIO SURGEON STRL SZ 6 (GLOVE) ×4 IMPLANT
GOWN STRL REUS W/ TWL LRG LVL3 (GOWN DISPOSABLE) ×2 IMPLANT
GOWN STRL REUS W/TWL LRG LVL3 (GOWN DISPOSABLE) ×4
NEEDLE HYPO 22GX1.5 SAFETY (NEEDLE) ×2 IMPLANT
NS IRRIG 1000ML POUR BTL (IV SOLUTION) ×2 IMPLANT
SUT VIC AB 0 CT1 27 (SUTURE) ×2
SUT VIC AB 0 CT1 27XBRD ANTBC (SUTURE) ×1 IMPLANT
SUT VIC AB 2-0 SH 27 (SUTURE) ×4
SUT VIC AB 2-0 SH 27X BRD (SUTURE) IMPLANT
SUT VIC AB 3-0 SH 27 (SUTURE) ×4
SUT VIC AB 3-0 SH 27XBRD (SUTURE) ×2 IMPLANT
SUT VIC AB 4-0 P2 18 (SUTURE) ×4 IMPLANT
SYR CONTROL 10ML LL (SYRINGE) ×2 IMPLANT
TOWEL OR 17X26 10 PK STRL BLUE (TOWEL DISPOSABLE) ×2 IMPLANT
TOWEL OR NON WOVEN STRL DISP B (DISPOSABLE) ×2 IMPLANT
TRAY FOLEY W/METER SILVER 14FR (SET/KITS/TRAYS/PACK) ×3 IMPLANT
TRAY FOLEY W/METER SILVER 16FR (SET/KITS/TRAYS/PACK) ×3 IMPLANT
WATER STERILE IRR 1500ML POUR (IV SOLUTION) ×2 IMPLANT

## 2015-01-30 NOTE — Interval H&P Note (Signed)
History and Physical Interval Note:  01/30/2015 9:54 AM  Julia [name]  has presented today for surgery, with the diagnosis of VIN III   The various methods of treatment have been discussed with the patient and family. After consideration of risks, benefits and other options for treatment, the patient has consented to  Procedure(s): WIDE LOCAL EXCISION VULVA (N/A) as a surgical intervention .  The patient's history has been reviewed, patient examined, no change in status, stable for surgery.  I have reviewed the patient's chart and labs.  Questions were answered to the patient's satisfaction.     Donaciano Eva

## 2015-01-30 NOTE — Anesthesia Procedure Notes (Signed)
Procedure Name: LMA Insertion Date/Time: 01/30/2015 10:44 AM Performed by: Carleene Cooper A Pre-anesthesia Checklist: Patient identified, Emergency Drugs available, Suction available, Patient being monitored and Timeout performed Patient Re-evaluated:Patient Re-evaluated prior to inductionOxygen Delivery Method: Circle system utilized Preoxygenation: Pre-oxygenation with 100% oxygen Intubation Type: IV induction Ventilation: Mask ventilation without difficulty LMA: LMA with gastric port inserted Number of attempts: 1 Placement Confirmation: positive ETCO2 and breath sounds checked- equal and bilateral Tube secured with: Tape Dental Injury: Teeth and Oropharynx as per pre-operative assessment  Comments: LMA placed by Richardean Canal, CRNA

## 2015-01-30 NOTE — Op Note (Signed)
PATIENT: Julia [name] DATE: 01/30/15   Preop Diagnosis: VIN3  Postoperative Diagnosis: VIN3  Surgery: Partial simple left vulvectomy  Surgeons:  Donaciano Eva, MD Assistant: none  Anesthesia: General    Estimated blood loss: 60ml  IVF:  125ml   Urine output: 50 ml   Complications: None   Pathology: left mid labia minora with marking stitch at 12 o'clock  Operative findings: 1cm area of leukoplakia at mid left labia minora  Procedure: The patient was identified in the preoperative holding area. Informed consent was signed on the chart. Patient was seen history was reviewed and exam was performed.   The patient was then taken to the operating room and placed in the supine position with SCD hose on. General anesthesia was then induced without difficulty. She was then placed in the dorsolithotomy position. The perineum was prepped with Betadine. The vagina was prepped with Betadine. The patient was then draped after the prep was dried. A Foley catheter was inserted into the bladder under sterile conditions.  Timeout was performed the patient, procedure, antibiotic, allergy, and length of procedure. 5% acetic acid solution was applied to the perineum. The vulvar tissues were inspected for areas of acetowhite changes or leukoplakia. The lesion was identified and the marking pen was used to circumscribe the area with appropriate surgical margins. The subcuticular tissues were infiltrated with 1% lidocaine. The 15 blade scalpel was used to make an incision through the skin circumferentially as marked. The skin elipse was grasped and was separated from the underlying deep dermal tissues with the bovie device. After the specimen had been completely resected, it was oriented and marked at 12 o'clock with a 0-vicryl suture. The bovie was used to obtain hemostasis at the surgical bed. The subcutaneous tissues were irrigated and made hemostatic.   The deep dermal layer was  approximated with 3-0vicryl mattress sutures to bring the skin edges into approximation and off tension. The wound was closed following langher's lines. The cutaneous layer was closed with interrupted 4-0 vicryl stitches and mattress sutures to ensure a tension free and hemostatic closure. The perineum was again irrigated. The foley was removed.  All instrument, suture, laparotomy, Ray-Tec, and needle counts were correct x2. The patient tolerated the procedure well and was taken recovery room in stable condition. This is Julia [name] dictating an operative note on Julia [name]. Donaciano Eva, MD

## 2015-01-30 NOTE — Anesthesia Preprocedure Evaluation (Addendum)
Anesthesia Evaluation  Patient identified by MRN, date of birth, ID band Patient awake    Reviewed: Allergy & Precautions, NPO status , Patient's Chart, lab work & pertinent test results  Airway Mallampati: II  TM Distance: >3 FB Neck ROM: Full    Dental no notable dental hx.    Pulmonary Current Smoker,    Pulmonary exam normal breath sounds clear to auscultation       Cardiovascular negative cardio ROS Normal cardiovascular exam Rhythm:Regular Rate:Normal     Neuro/Psych negative neurological ROS  negative psych ROS   GI/Hepatic negative GI ROS, Neg liver ROS,   Endo/Other  negative endocrine ROS  Renal/GU negative Renal ROS  negative genitourinary   Musculoskeletal negative musculoskeletal ROS (+)   Abdominal   Peds negative pediatric ROS (+)  Hematology negative hematology ROS (+)   Anesthesia Other Findings   Reproductive/Obstetrics negative OB ROS                            Anesthesia Physical Anesthesia Plan  ASA: II  Anesthesia Plan: General   Post-op Pain Management:    Induction: Intravenous  Airway Management Planned: LMA  Additional Equipment:   Intra-op Plan:   Post-operative Plan: Extubation in OR  Informed Consent: I have reviewed the patients History and Physical, chart, labs and discussed the procedure including the risks, benefits and alternatives for the proposed anesthesia with the patient or authorized representative who has indicated his/her understanding and acceptance.   Dental advisory given  Plan Discussed with: CRNA  Anesthesia Plan Comments:         Anesthesia Quick Evaluation  

## 2015-01-30 NOTE — Transfer of Care (Signed)
Immediate Anesthesia Transfer of Care Note  Patient: Julia [name]  Procedure(s) Performed: Procedure(s): WIDE LOCAL EXCISION VULVA (N/A)  Patient Location: PACU  Anesthesia Type:General  Level of Consciousness: awake, alert , oriented and patient cooperative  Airway & Oxygen Therapy: Patient Spontanous Breathing and Patient connected to face mask oxygen  Post-op Assessment: Report given to RN and Post -op Vital signs reviewed and stable  Post vital signs: Reviewed and stable  Last Vitals:  Filed Vitals:   01/30/15 0750  BP: 126/65  Pulse: 78  Temp: 36.3 C  Resp: 16    Complications: No apparent anesthesia complications

## 2015-01-30 NOTE — Discharge Instructions (Signed)
Vulvectomy, Care After °The vulva is the external female genitalia, outside and around the vagina and pubic bone. It consists of: °· The skin on, and in front of, the pubic bone. °· The clitoris. °· The labia majora (large lips) on the outside of the vagina. °· The labia minora (small lips) around the opening of the vagina. °· The opening and the skin in and around the vagina. °A vulvectomy is the removal of the tissue of the vulva, which sometimes includes removal of the lymph nodes and tissue in the groin areas. °These discharge instructions provide you with general information on caring for yourself after you leave the hospital. It is also important that you know the warning signs of complications, so that you can seek treatment. Please read the instructions outlined below and refer to this sheet in the next few weeks. Your caregiver may also give you specific information and medicines. If you have any questions or complications after discharge, please call your caregiver. °ACTIVITY °· Rest as much as possible the first two weeks after discharge. °· Arrange to have help from family or others with your daily activities when you go home. °· Avoid heavy lifting (more than 5 pounds), pushing, or pulling. °· If you feel tired, balance your activity with rest periods. °· Follow your caregiver's instruction about climbing stairs and driving a car. °· Increase activity gradually. °· Do not exercise until you have permission from your caregiver. °LEG AND FOOT CARE °If your doctor has removed lymph nodes from your groin area, there may be an increase in swelling of your legs and feet. You can help prevent swelling by doing the following: °· Elevate your legs while sitting or lying down. °· If your caregiver has ordered special stockings, wear them according to instructions. °· Avoid standing in one place for long periods of time. °· Call the physical therapy department if you have any questions about swelling or treatment  for swelling. °· Avoid salt in your diet. It can cause fluid retention and swelling. °· Do not cross your legs, especially when sitting. °NUTRITION °· You may resume your normal diet. °· Drink 6 to 8 glasses of fluids a day. °· Eat a healthy, balanced diet including portions of food from the meat (protein), milk, fruit, vegetable, and bread groups. °· Your caregiver may recommend you take a multivitamin with iron. °ELIMINATION °· You may notice that your stream of urine is at a different angle, and may tend to spray. Using a plastic funnel may help to decrease urine spray. °· If constipation occurs, drink more liquids, and add more fruits, vegetables, and bran to your diet. You may take a mild laxative, such as Milk of Magnesia, Metamucil, or a stool softener such as Colace, with permission from your caregiver. °HYGIENE °· You may shower and wash your hair. °· Check with your caregiver about tub baths. °· Do not add any bath oils or chemicals to your bath water, after you have permission to take baths. °· While passing urine, pour water from a bottle or spray over your vulva to dilute the urine as it passes the incision (this will decrease burning and discomfort). °· Clean yourself well after moving your bowels. °· After urinating, do not wipe. Dap or pat dry with toilet paper or a dry cleath soft cloth. °· A sitz bath will help keep your perineal area clean, reduce swelling, and provide comfort. °· Avoid wearing underpants for the first 2 weeks and wear loose skirts to   to allow circulation of air around the incision °· You do not need to apply dressings, salves or lotions to the wound. °· The stitches are self-dissolving and will absorb and disappear over a couple of months (it is normal to notice the knot from the stitches on toilet paper after voiding). °HOME CARE INSTRUCTIONS  °· Apply a soft ice pack (or frozen bag of peas) to your perineum (vulva) every hour in the first 48 hours after surgery. This  will reduce swelling. °· Avoid activities that involve a lot of friction between your legs. °· Avoid wearing pants or underpants in the 1st 2 weeks (skirts are preferable). °· Take your temperature twice a day and record it, especially if you feel feverish or have chills. °· Follow your caregiver's instructions about medicines, activity, and follow-up appointments after surgery. °· Do not drink alcohol while taking pain medicine. °· Change your dressing as advised by your caregiver. °· You may take over-the-counter medicine for pain, recommended by your caregiver. °· If your pain is not relieved with medicine, call your caregiver. °· Do not take aspirin because it can cause bleeding. °· Do not douche or use tampons (use a nonperfumed sanitary pad). °· Do not have sexual intercourse until your caregiver gives you permission (typically 6 weeks postoperatively). Hugging, kissing, and playful sexual activity is fine with your caregiver's permission. °· Warm sitz baths, with your caregiver's permission, are helpful to control swelling and discomfort. °· Take showers instead of baths, until your caregiver gives you permission to take baths. °· You may take a mild medicine for constipation, recommended by your caregiver. Bran foods and drinking a lot of fluids will help with constipation. °· Make sure your family understands everything about your operation and recovery. °SEEK MEDICAL CARE IF:  °· You notice swelling and redness around the wound area. °· You notice a foul smell coming from the wound or on the surgical dressing. °· You notice the wound is separating. °· You have painful or bloody urination. °· You develop nausea and vomiting. °· You develop diarrhea. °· You develop a rash. °· You have a reaction or allergy from the medicine. °· You feel dizzy or light-headed. °· You need stronger pain medicine. °SEEK IMMEDIATE MEDICAL CARE IF:  °· You develop a temperature of 102° F (38.9° C) or higher. °· You pass  out. °· You develop leg or chest pain. °· You develop abdominal pain. °· You develop shortness of breath. °· You develop bleeding from the wound area. °· You see pus in the wound area. °MAKE SURE YOU:  °· Understand these instructions. °· Will watch your condition. °· Will get help right away if you are not doing well or get worse. °Document Released: 11/06/2003 Document Revised: 08/08/2013 Document Reviewed: 02/23/2009 °[employer]® Patient Information ©2015 [employer]. This information is not intended to replace advice given to you by your health care provider. Make sure you discuss any questions you have with your health care provider. °

## 2015-01-30 NOTE — H&P (View-Only) (Signed)
Consult Note: Gyn-Onc  Consult was requested by Dr. Lynnette Caffey for the evaluation of Julia [name] 49 y.o. 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 VIN III    New patient    Assessment/Plan:  [name]  is a 49 y.o.  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   We discussed the etiology of this condition (likely a combination of HPV disease and/or tobacco exposure). I discussed that this disease is a precursor for invasive malignancy and sometimes coexist with invasive malignancy. I recommend intervention with surgical excision. I discussed that surgical excision in this area may be associated with sexual dysfunction due to the relative proximity to the clitoris. We discussed postoperative wound care. I discussed the high likelihood of wound separation particularly in a chronic smoker. I discussed postoperative restrictions.  We will plan for a wide local excision of the left vulva. She has history of chronic pain. I discussed with the patient that I will prescribe 6 weeks of postoperative analgesia but after that period of time she will need to be evaluated by chronic pain professional if she has ongoing analgesic requirements. I discussed that her history of tobacco abuse puts her at increased risk for surgical complications and failure of wound healing. She was encouraged to quit.   HPI: Julia [name] is a 49 year old woman who is seen in consultation at the request of Dr. Lynnette Caffey for VIN 3. The patient has a history of vulvar pain for a proximally 5 months. She experiences discomfort with micturition. She denies pruritus. She's seen and evaluated by Dr. Lynnette Caffey in September 2016. 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   She is a lifetime smoker with a 35 year pack year history. She is no history of abnormal  Pap smears. She reports she's been regular with her Pap smear follow-up.   Current Meds:  Outpatient Encounter Prescriptions as of 01/26/2015  Medication Sig  . atorvastatin (LIPITOR) 10 MG tablet Take 1 tablet (10 mg total) by mouth daily.  Marland Kitchen ibuprofen (ADVIL,MOTRIN) 600 MG tablet TAKE ONE TABLET BY MOUTH EVERY 8 HOURS AS NEEDED   No facility-administered encounter medications on file as of 01/26/2015.    Allergy:  Allergies  Allergen Reactions  . Propoxyphene N-Acetaminophen Hives  . Nabumetone Rash    She can tolerate ibuprofen w/o difficulty  . Rofecoxib Rash    nightmares    Social Hx:   Social History   Social History  . Marital Status: Married    Spouse Name: N/A  . Number of Children: 1  . Years of Education: N/A   Occupational History  . 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 Smoking status: Current Every Day Smoker -- 1.00 packs/day for 31 years    Types: Cigarettes  . Smokeless tobacco: Never Used  . Alcohol Use: No  . Drug Use: No  . Sexual Activity: Not on file   Other Topics Concern  . Not on file   Social History Narrative   One son   Married 2000 (had been together since 1984)    Past Surgical Hx:  Past Surgical History  Procedure Laterality Date  . Cholecystectomy      Past Medical Hx:  Past Medical History  Diagnosis Date  . Smoking     Past  Gynecological History:  Denies abn paps No LMP recorded. Patient is postmenopausal.  Family Hx:  Family History  Problem Relation Age of Onset  . Hypertension Mother   . Diabetes Mother     insulin dependent  . Heart disease Mother   . Diabetes Father   . Heart disease Father     CAD  . Hypertension Brother   . Kidney disease Brother     kidney failure/resolved  . Colon cancer Maternal Aunt     dx'd at ~62  . Breast cancer Neg Hx     Review of Systems:  Constitutional  Feels well,    ENT Normal appearing ears and nares bilaterally Skin/Breast  No  rash, sores, jaundice, itching, dryness Cardiovascular  No chest pain, shortness of breath, or edema  Pulmonary  No cough or wheeze.  Gastro Intestinal  No nausea, vomitting, or diarrhoea. No bright red blood per rectum, no abdominal pain, change in bowel movement, or constipation.  Genito Urinary  No frequency, urgency, dysuria,  Musculo Skeletal  No myalgia, arthralgia, joint swelling or pain  Neurologic  No weakness, numbness, change in gait,  Psychology  No depression, anxiety, insomnia.   Vitals:  Blood pressure 133/68, pulse 76, temperature 98.1 F (36.7 C), temperature source Oral, resp. rate 20, height 5\' 3"  (1.6 m), weight 155 lb 11.2 oz (70.625 kg), SpO2 98 %.  Physical Exam: WD in NAD Neck  Supple NROM, without any enlargements.  Lymph Node Survey No cervical supraclavicular or inguinal adenopathy Cardiovascular  Pulse normal rate, regularity and rhythm. S1 and S2 normal.  Lungs  Clear to auscultation bilateraly, without wheezes/crackles/rhonchi. Good air movement.  Skin  No rash/lesions/breakdown  Psychiatry  Alert and oriented to person, place, and time  Abdomen  Normoactive bowel sounds, abdomen soft, non-tender and thinhout evidence of hernia.  Back No CVA tenderness Genito Urinary  Vulva/vagina: Normal external female genitalia. A 1 cm area of leukoplakia is appreciated in the left mid anterior labia minora. It is slightly nodular but is mobile and not fixed to underlying tissues. It is more than 2 cm away from the midline. Its greater than 2 cm away from the urethral meatus. 4% acetic acid was applied to the entirety of the vulva. No additional lesions were appreciated on the vulva.. Rectal  deferred Extremities  No bilateral cyanosis, clubbing or edema.   Donaciano Eva, MD  01/26/2015, 5:05 PM

## 2015-01-30 NOTE — Anesthesia Postprocedure Evaluation (Signed)
  Anesthesia Post-op Note  Patient: Julia [name]  Procedure(s) Performed: Procedure(s) (LRB): WIDE LOCAL EXCISION VULVA (N/A)  Patient Location: PACU  Anesthesia Type: General  Level of Consciousness: awake and alert   Airway and Oxygen Therapy: Patient Spontanous Breathing  Post-op Pain: mild  Post-op Assessment: Post-op Vital signs reviewed, Patient's Cardiovascular Status Stable, Respiratory Function Stable, Patent Airway and No signs of Nausea or vomiting  Last Vitals:  Filed Vitals:   01/30/15 1145  BP: 104/45  Pulse: 78  Temp: 36.7 C  Resp: 15    Post-op Vital Signs: stable   Complications: No apparent anesthesia complications

## 2015-02-01 ENCOUNTER — Telehealth: Payer: Self-pay | Admitting: *Deleted

## 2015-02-01 NOTE — Telephone Encounter (Signed)
Received phone call from patient requesting to make 4 week post-op appt. Pt scheduled for 02/28/15 at 1:15pm. Patient denies any post-op concerns at this time. She states she is sore but denies any pain today. Instructed patient to call our office prior to scheduled appt if she has any additional questions or concerns - patient agreeable to this.

## 2015-02-16 ENCOUNTER — Telehealth: Payer: Self-pay | Admitting: Gynecologic Oncology

## 2015-02-16 NOTE — Telephone Encounter (Signed)
Returned call to patient.  Unable to leave message.  Patient called back to the office.  She had left a message earlier stating her incision had "busted open."  Patient informed that the incisions can open and she would need to continue her peri care.  Denies fever, erythema around the incision, drainage.  Reportable signs and symptoms reviewed.  She is advised to monitor over the weekend, continue peri care, call for any new symptoms, and to call the office on Monday with an update.  Advised she could always be worked in sooner than her scheduled follow up appt.

## 2015-02-20 ENCOUNTER — Encounter: Payer: Self-pay | Admitting: Gynecologic Oncology

## 2015-02-20 ENCOUNTER — Telehealth: Payer: Self-pay | Admitting: Gynecologic Oncology

## 2015-02-20 NOTE — Telephone Encounter (Signed)
Patient left message.  Returned call to patient.  Patient stating her manager is trying to make her work 12 hour shifts a day and she was told by Dr. Denman George to only work 5 hours a day.  Patient requesting a letter for her to pick up stating she can only work 5 hours a day until released by Dr. Denman George.  Her follow up appt is on Nov 23.  Letter left for the patient at the front desk.  Advised to call for any needs or concerns.

## 2015-02-28 ENCOUNTER — Encounter: Payer: Self-pay | Admitting: Gynecologic Oncology

## 2015-02-28 ENCOUNTER — Ambulatory Visit: Payer: 59 | Attending: Gynecologic Oncology | Admitting: Gynecologic Oncology

## 2015-02-28 VITALS — BP 127/65 | HR 90 | Temp 98.6°F | Resp 18 | Ht 63.0 in | Wt 155.6 lb

## 2015-02-28 DIAGNOSIS — T8130XA Disruption of wound, unspecified, initial encounter: Secondary | ICD-10-CM | POA: Diagnosis not present

## 2015-02-28 DIAGNOSIS — D071 Carcinoma in situ of vulva: Secondary | ICD-10-CM | POA: Diagnosis not present

## 2015-02-28 MED ORDER — OXYCODONE-ACETAMINOPHEN 5-325 MG PO TABS: 1.0000 | ORAL_TABLET | Freq: Four times a day (QID) | ORAL | Status: DC | PRN

## 2015-02-28 NOTE — Progress Notes (Signed)
POSTOPERATIVE EVALUATION  CC:  Chief Complaint  Patient presents with  . VIN III    Post-op   Assessment:    49 y.o. year old with VIN3.   S/p wide local excision left vulva on 01/30/15.   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 Wound separation  Plan: 1) Pathology reports reviewed today 2) Treatment counseling - I discussed that she has premalignant changes with negative margins. She has a 25% risk for recurrence. I am recommending 6 monthly vulvar inspections. With respect to wound healing, I recommend topical neosporin or A&D ointment to act as a barrier. I represcribed percocet at her request.  She was given the opportunity to ask questions, which were answered to her satisfaction, and she is agreement with the above mentioned plan of care.  3)  Return to clinic in 6 months to see me and in 12 months to see Julia Lynnette Caffey.   HPI:  Julia [name] is a 49 y.o. year old No obstetric history on file. initially seen in consultation on 01/26/15 referred by Julia [name] for [facility].  She then underwent a left partial simple vulvectomy on [id number] without complications.  Her postoperative course was complicated by wound separation.  Her final pathology revealed VIN3 with negative margins.  She is seen today for a postoperative check and to discuss her pathology results and ongoing plan.  Since discharge from the hospital, she is feeling overall well but still has pain and her wound separated which causes irritation and burning. She is still taking percocet.  She has no other complaints today.  Current Outpatient Prescriptions on File Prior to Visit  Medication Sig Dispense Refill  . atorvastatin (LIPITOR) 10 MG tablet Take 1 tablet (10 mg total) by mouth daily. 90 tablet 3  . docusate sodium (COLACE) 100 MG capsule Take 1 capsule (100 mg total) by mouth 2 (two) times daily. 30 capsule 0  . ibuprofen (ADVIL,MOTRIN) 600 MG tablet TAKE ONE TABLET BY MOUTH EVERY 8 HOURS AS NEEDED (Patient  taking differently: TAKES TWO TABLETS BY MOUTH EVERY 8 HOURS AS NEEDED FOR PAIN.) 30 tablet 0   No current facility-administered medications on file prior to visit.   Allergies  Allergen Reactions  . Propoxyphene N-Acetaminophen Hives  . Nabumetone Rash    She can tolerate ibuprofen w/o difficulty  . Rofecoxib Rash    nightmares   Past Medical History  Diagnosis Date  . Smoking   . GERD (gastroesophageal reflux disease)    Past Surgical History  Procedure Laterality Date  . Cholecystectomy    . Vulvectomy N/A 01/30/2015    Procedure: WIDE LOCAL EXCISION VULVA;  Surgeon: Everitt Amber, MD;  Location: [facility];  Service: Gynecology;  Laterality: N/A;   Social History   Social History  . Marital Status: Married    Spouse Name: N/A  . Number of Children: 1  . 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 Smoking status: Current Every Day Smoker -- 1.00 packs/day for 31 years    Types: Cigarettes  . Smokeless tobacco: Never Used  . Alcohol Use: No  . Drug Use: No  . Sexual Activity: Not on file   Other Topics Concern  . Not on file   Social History Narrative   One son   Married 2000 (had been together since 1984)   Family History  Problem Relation Age of Onset  . Hypertension Mother   .  Diabetes Mother     insulin dependent  . Heart disease Mother   . Diabetes Father   . Heart disease Father     CAD  . Hypertension Brother   . Kidney disease Brother     kidney failure/resolved  . Colon cancer Maternal Aunt     dx'd at ~62  . Breast cancer Neg Hx     Review of systems: Constitutional:  She has no weight gain or weight loss. She has no fever or chills. Eyes: No blurred vision Ears, Nose, Mouth, Throat: No dizziness, headaches or changes in hearing. No mouth sores. Cardiovascular: No chest pain, palpitations or edema. Respiratory:  No shortness of breath, wheezing or cough Gastrointestinal:  She has normal bowel movements without diarrhea or constipation. She denies any nausea or vomiting. She denies blood in her stool or heart burn. Genitourinary:  She denies pelvic pain, pelvic pressure or changes in her urinary function. She has no hematuria, dysuria, or incontinence. She has no irregular vaginal bleeding or vaginal discharge Musculoskeletal: Denies muscle weakness or joint pains.  Skin:  She has no skin changes, rashes or itching Neurological:  Denies dizziness or headaches. No neuropathy, no numbness or tingling. Psychiatric:  She denies depression or anxiety. Hematologic/Lymphatic:   No easy bruising or bleeding   Physical Exam: Blood pressure 127/65, pulse 90, temperature 98.6 F (37 C), temperature source Oral, resp. rate 18, height 5\' 3"  (1.6 m), weight 155 lb 9.6 oz (70.58 kg), SpO2 100 %. General: Well dressed, well nourished in no apparent distress.   HEENT:  Normocephalic and atraumatic, no lesions.  Extraocular muscles intact. Sclerae anicteric. Pupils equal, round, reactive. No mouth sores or ulcers. Thyroid is normal size, not nodular, midline. Genitourinary: left labia minora has wound separation with clean granulating tissue. No evidence for infection. Extremities: No cyanosis, clubbing or edema.  No calf tenderness or erythema. No palpable cords. Psychiatric: Mood and affect are appropriate. Neurological: Awake, alert and oriented x 3. Sensation is intact, no neuropathy.  Musculoskeletal: No pain, normal strength and range of motion.  Donaciano Eva, MD

## 2015-02-28 NOTE — Patient Instructions (Signed)
Plan to follow up with Dr. Denman George in six months or sooner.  Plan to apply A &D ointment or neosporin to the raw area.  Please call for any questions or concerns.

## 2015-04-30 ENCOUNTER — Other Ambulatory Visit: Payer: Self-pay

## 2015-04-30 MED ORDER — IBUPROFEN 600 MG PO TABS: 600.0000 mg | ORAL_TABLET | Freq: Three times a day (TID) | ORAL | Status: DC | PRN

## 2015-04-30 NOTE — Telephone Encounter (Signed)
Pt request refill ibuprofen 600 mg to [facility] for migraines. rx last refilled # 30 on 01/24/15. Pt last annual exam 12/21/14. Pt completely out of med. Dr Damita Dunnings out of office and will send to Dr Darnell Level.

## 2015-04-30 NOTE — Telephone Encounter (Signed)
Sent in

## 2015-05-14 ENCOUNTER — Other Ambulatory Visit: Payer: Self-pay

## 2015-05-14 NOTE — Telephone Encounter (Signed)
Pt left v/m and changing pharmacies to [facility] and request refill ibuprofen (last refilled # 30 on 04/30/15; last annual exam on 12/21/14.)

## 2015-05-15 ENCOUNTER — Other Ambulatory Visit: Payer: Self-pay | Admitting: *Deleted

## 2015-05-15 MED ORDER — IBUPROFEN 600 MG PO TABS: 600.0000 mg | ORAL_TABLET | Freq: Three times a day (TID) | ORAL | Status: DC | PRN

## 2015-05-15 NOTE — Telephone Encounter (Signed)
Sent. Thanks.   

## 2015-05-30 ENCOUNTER — Encounter: Payer: Self-pay | Admitting: Family Medicine

## 2015-05-30 ENCOUNTER — Ambulatory Visit (INDEPENDENT_AMBULATORY_CARE_PROVIDER_SITE_OTHER): Payer: BLUE CROSS/BLUE SHIELD | Admitting: Family Medicine

## 2015-05-30 VITALS — BP 100/62 | HR 74 | Temp 98.3°F | Wt 158.0 lb

## 2015-05-30 DIAGNOSIS — R300 Dysuria: Secondary | ICD-10-CM

## 2015-05-30 DIAGNOSIS — D071 Carcinoma in situ of vulva: Secondary | ICD-10-CM | POA: Diagnosis not present

## 2015-05-30 DIAGNOSIS — H109 Unspecified conjunctivitis: Secondary | ICD-10-CM | POA: Diagnosis not present

## 2015-05-30 DIAGNOSIS — R309 Painful micturition, unspecified: Secondary | ICD-10-CM | POA: Diagnosis not present

## 2015-05-30 LAB — POC URINALSYSI DIPSTICK (AUTOMATED)
Bilirubin, UA: NEGATIVE
Blood, UA: POSITIVE
Glucose, UA: NEGATIVE
Ketones, UA: NEGATIVE
Nitrite, UA: POSITIVE
Protein, UA: NEGATIVE
Spec Grav, UA: >=1.03
Urobilinogen, UA: 0.2
pH, UA: 5.5

## 2015-05-30 MED ORDER — CIPROFLOXACIN HCL 250 MG PO TABS: 250.0000 mg | ORAL_TABLET | Freq: Two times a day (BID) | ORAL | Status: DC

## 2015-05-30 NOTE — Assessment & Plan Note (Signed)
cipro should cover both pinkeye (presumed bacterial) and cystitis.  D/w pt.  Routine cautions d/w pt.  F/u prn.  ucx pending.  U/a d/w pt.

## 2015-05-30 NOTE — Patient Instructions (Addendum)
Drink plenty of water and start the antibiotics today.  We'll contact you with your lab report.  Take care.  Glad to see you.  

## 2015-05-30 NOTE — Progress Notes (Signed)
Pre visit review using our clinic review tool, if applicable. No additional management support is needed unless otherwise documented below in the visit note.  VIN III excision with neg margins, d/w pt. Still has some irritation.  She has routine f/u with gyn pending.    dysuria:yes duration of symptoms: weeks.  abdominal pain: yes, lower abd pain Fevers: no back pain: lower back pain.  Vomiting: no  R eye irritation/itchy with some drainage.  No injury, no FB.  No vision change except from obstruction from discharge.  No L eye sx.  No FNAVD.  No nasal or ear sx o/w.   Meds, vitals, and allergies reviewed.   ROS: See HPI.  Otherwise negative.    GEN: nad, alert and oriented HEENT: mucous membranes moist, tm wnl B, OP wnl. L eye normal inspection.  R eye with limbus sparing injection w/o FB noted.  She has prev cleared away any discharge.  PERRL. EOMI NECK: supple CV: rrr.  PULM: ctab, no inc wob ABD: soft, +bs, suprapubic area tender, no rebound.  EXT: no edema SKIN: no acute rash but post excisional changes noted on the L labia, appears healed, chaperoned exam.   BACK: no CVA pain

## 2015-05-30 NOTE — Assessment & Plan Note (Signed)
cipro should cover both pinkeye (presumed bacterial) and cystitis.  D/w pt.  Routine cautions d/w pt.  F/u prn.

## 2015-05-30 NOTE — Assessment & Plan Note (Signed)
Appears healed with normal/small amount of residual scar tissue, d/w pt.  She can f/u with gyn clinic.

## 2015-05-31 ENCOUNTER — Telehealth: Payer: Self-pay | Admitting: Family Medicine

## 2015-05-31 MED ORDER — PHENAZOPYRIDINE HCL 95 MG PO TABS: 95.0000 mg | ORAL_TABLET | Freq: Three times a day (TID) | ORAL | Status: DC | PRN

## 2015-05-31 NOTE — Telephone Encounter (Signed)
Her culture isn't back.  I don't know if she has a resistant bacteria or if the med hasn't had time to help yet.  Would try pyridium in the meantime.  Sent.   Thanks.

## 2015-05-31 NOTE — Telephone Encounter (Signed)
Patient advised.

## 2015-05-31 NOTE — Telephone Encounter (Signed)
Patient was seen yesterday.  Patient said the Ibuprofen is helping with her headaches, but she's still having a lot of pain with the bladder infection.

## 2015-06-02 LAB — URINE CULTURE: Colony Count: >=100000

## 2015-06-06 ENCOUNTER — Other Ambulatory Visit: Payer: Self-pay | Admitting: Family Medicine

## 2015-06-06 NOTE — Telephone Encounter (Signed)
Okay to continue.  Sent.  Thanks. 

## 2015-06-06 NOTE — Telephone Encounter (Signed)
Received refill request electronically Last refill 05/15/15 #30 Last office visit 05/30/15 acute See allergy/contraindication Is it okay to refill medication?

## 2015-07-03 ENCOUNTER — Other Ambulatory Visit: Payer: Self-pay | Admitting: Family Medicine

## 2015-07-03 NOTE — Telephone Encounter (Signed)
Electronic refill request. Last Filled:    30 tablet 0 06/06/2015  Last office visit:   05/30/15  Please advise.

## 2015-07-04 NOTE — Telephone Encounter (Signed)
Sent. Thanks.   

## 2015-08-07 ENCOUNTER — Other Ambulatory Visit: Payer: Self-pay | Admitting: Family Medicine

## 2015-08-08 NOTE — Telephone Encounter (Signed)
Received refill electronically Last refill 07/04/15 #30/1 Last office visit 05/30/15/acute

## 2015-08-08 NOTE — Telephone Encounter (Signed)
Sent. Thanks.   

## 2015-08-31 ENCOUNTER — Ambulatory Visit: Payer: 59 | Admitting: Gynecologic Oncology

## 2015-09-04 ENCOUNTER — Encounter: Payer: Self-pay | Admitting: Internal Medicine

## 2015-09-04 ENCOUNTER — Ambulatory Visit (INDEPENDENT_AMBULATORY_CARE_PROVIDER_SITE_OTHER): Payer: BLUE CROSS/BLUE SHIELD | Admitting: Internal Medicine

## 2015-09-04 ENCOUNTER — Ambulatory Visit: Payer: BLUE CROSS/BLUE SHIELD | Admitting: Family Medicine

## 2015-09-04 VITALS — BP 104/74 | HR 76 | Temp 98.1°F | Wt 151.2 lb

## 2015-09-04 DIAGNOSIS — S30861A Insect bite (nonvenomous) of abdominal wall, initial encounter: Secondary | ICD-10-CM | POA: Diagnosis not present

## 2015-09-04 DIAGNOSIS — W57XXXA Bitten or stung by nonvenomous insect and other nonvenomous arthropods, initial encounter: Secondary | ICD-10-CM

## 2015-09-04 NOTE — Progress Notes (Signed)
Pre visit review using our clinic review tool, if applicable. No additional management support is needed unless otherwise documented below in the visit note. 

## 2015-09-04 NOTE — Progress Notes (Signed)
Subjective:    Patient ID: Julia [name], female    DOB: [birth year], 50 y.o.   MRN: [id number]  HPI  Pt presents to the clinic today with c/o a tick bite to the right side of her abdomen. She noticed it this morning. She is not sure how long it was on her. She pulled it out but thinks the head may be left in her skin. She denies fever, rash, nausea, joint pain or confusion. She has not put anything on it.  Review of Systems      Past Medical History  Diagnosis Date  . Smoking   . GERD (gastroesophageal reflux disease)     Current Outpatient Prescriptions  Medication Sig Dispense Refill  . atorvastatin (LIPITOR) 10 MG tablet Take 1 tablet (10 mg total) by mouth daily. 90 tablet 3  . docusate sodium (COLACE) 100 MG capsule Take 1 capsule (100 mg total) by mouth 2 (two) times daily. 30 capsule 0  . ibuprofen (ADVIL,MOTRIN) 600 MG tablet TAKE 1 TABLET BY MOUTH EVERY 8 HOURS AS NEEDED. WITH FOOD. 30 tablet 1  . oxyCODONE-acetaminophen (PERCOCET) 5-325 MG tablet Take 1-2 tablets by mouth every 6 (six) hours as needed for severe pain. 30 tablet 0  . phenazopyridine (PYRIDIUM) 95 MG tablet Take 1 tablet (95 mg total) by mouth 3 (three) times daily as needed for pain. 10 tablet 0   No current facility-administered medications for this visit.    Allergies  Allergen Reactions  . Propoxyphene N-Acetaminophen Hives  . Nabumetone Rash    She can tolerate ibuprofen w/o difficulty  . Rofecoxib Rash    nightmares    Family History  Problem Relation Age of Onset  . Hypertension Mother   . Diabetes Mother     insulin dependent  . Heart disease Mother   . Diabetes Father   . Heart disease Father     CAD  . Hypertension Brother   . Kidney disease Brother     kidney failure/resolved  . Colon cancer Maternal Aunt     dx'd at ~62  . Breast cancer Neg Hx     Social History   Social History  . Marital Status: Married    Spouse Name: N/A  . Number of Children: 1  . Years of  Education: N/A   Occupational History  . 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 Smoking status: Current Every Day Smoker -- 1.00 packs/day for 31 years    Types: Cigarettes  . Smokeless tobacco: Never Used  . Alcohol Use: No  . Drug Use: No  . Sexual Activity: Not on file   Other Topics Concern  . Not on file   Social History Narrative   One son   Married 2000 (had been together since 1984)     Constitutional: Denies fever, malaise, fatigue, headache or abrupt weight changes.  Respiratory: Denies difficulty breathing, shortness of breath, cough or sputum production.   Cardiovascular: Denies chest pain, chest tightness, palpitations or swelling in the hands or feet.  Musculoskeletal: Denies decrease in range of motion, difficulty with gait, muscle pain or joint pain and swelling.  Skin: Pt reports tick bite to right abdomen. Denies rashes, or ulcercations.  Neurological: Denies dizziness, difficulty with memory, difficulty with speech or problems with balance and coordination.    No other specific complaints in a complete review of systems (except as listed in HPI above).  Objective:   Physical Exam  BP 104/74 mmHg  Pulse 76  Temp(Src) 98.1 F (36.7 C) (Oral)  Wt 151 lb 4 oz (68.607 kg)  SpO2 97% Wt Readings from Last 3 Encounters:  09/04/15 151 lb 4 oz (68.607 kg)  05/30/15 158 lb (71.668 kg)  02/28/15 155 lb 9.6 oz (70.58 kg)    General: Appears her ted age, well developed, well nourished in NAD. Skin: Tick bite noted of right lower abdomen. Head still attached. Small amount of induration noted.  BMET    Component Value Date/Time   NA 138 12/21/2014 1355   K 3.9 12/21/2014 1355   CL 103 12/21/2014 1355   CO2 28 12/21/2014 1355   GLUCOSE 98 12/21/2014 1355   BUN 11 12/21/2014 1355   CREATININE 0.76 12/21/2014 1355   CALCIUM 9.7 12/21/2014 1355   GFRNONAA 98 06/05/2006 1117   GFRAA 119 06/05/2006 1117    Lipid Panel       Component Value Date/Time   CHOL 228* 12/29/2014 0856   TRIG * 12/29/2014 0856    557.0 Triglyceride is over 400; calculations on Lipids are invalid.   HDL 29.80* 12/29/2014 0856   CHOLHDL 8 12/29/2014 0856   VLDL 49.8* 05/07/2011 0939    CBC    Component Value Date/Time   WBC 9.2 01/30/2015 0830   RBC 4.57 01/30/2015 0830   HGB 14.8 01/30/2015 0830   HCT 42.7 01/30/2015 0830   PLT 218 01/30/2015 0830   MCV 93.4 01/30/2015 0830   MCH 32.4 01/30/2015 0830   MCHC 34.7 01/30/2015 0830   RDW 12.8 01/30/2015 0830   MONOABS 0.5 06/05/2006 1117   EOSABS 0.3 06/05/2006 1117   BASOSABS 0.1 06/05/2006 1117    Hgb A1C Lab Results  Component Value Date   HGBA1C 5.5 12/15/2007        Assessment & Plan:  Tick bite of abdomen:  Head removed with 18 g needle Area covered with TAB and bandaid Return precautions given  RTC as needed or if symptoms persist or worsen

## 2015-09-04 NOTE — Patient Instructions (Signed)
Tick Bite Information Ticks are insects that attach themselves to the skin and draw blood for food. There are various types of ticks. Common types include wood ticks and deer ticks. Most ticks live in shrubs and grassy areas. Ticks can climb onto your body when you make contact with leaves or grass where the tick is waiting. The most common places on the body for ticks to attach themselves are the scalp, neck, armpits, waist, and groin. Most tick bites are harmless, but sometimes ticks carry germs that cause diseases. These germs can be spread to a person during the tick's feeding process. The chance of a disease spreading through a tick bite depends on:   The type of tick.  Time of year.   How long the tick is attached.   Geographic location.  HOW CAN YOU PREVENT TICK BITES? Take these steps to help prevent tick bites when you are outdoors:  Wear protective clothing. Long sleeves and long pants are best.   Wear white clothes so you can see ticks more easily.  Tuck your pant legs into your socks.   If walking on a trail, stay in the middle of the trail to avoid brushing against bushes.  Avoid walking through areas with long grass.  Put insect repellent on all exposed skin and along boot tops, pant legs, and sleeve cuffs.   Check clothing, hair, and skin repeatedly and before going inside.   Brush off any ticks that are not attached.  Take a shower or bath as soon as possible after being outdoors.  WHAT IS THE PROPER WAY TO REMOVE A TICK? Ticks should be removed as soon as possible to help prevent diseases caused by tick bites. 1. If latex gloves are available, put them on before trying to remove a tick.  2. Using fine-point tweezers, grasp the tick as close to the skin as possible. You may also use curved forceps or a tick removal tool. Grasp the tick as close to its head as possible. Avoid grasping the tick on its body. 3. Pull gently with steady upward pressure until  the tick lets go. Do not twist the tick or jerk it suddenly. This may break off the tick's head or mouth parts. 4. Do not squeeze or crush the tick's body. This could force disease-carrying fluids from the tick into your body.  5. After the tick is removed, wash the bite area and your hands with soap and water or other disinfectant such as alcohol. 6. Apply a small amount of antiseptic cream or ointment to the bite site.  7. Wash and disinfect any instruments that were used.  Do not try to remove a tick by applying a hot match, petroleum jelly, or fingernail polish to the tick. These methods do not work and may increase the chances of disease being spread from the tick bite.  WHEN SHOULD YOU SEEK MEDICAL CARE? Contact your health care provider if you are unable to remove a tick from your skin or if a part of the tick breaks off and is stuck in the skin.  After a tick bite, you need to be aware of signs and symptoms that could be related to diseases spread by ticks. Contact your health care provider if you develop any of the following in the days or weeks after the tick bite:  Unexplained fever.  Rash. A circular rash that appears days or weeks after the tick bite may indicate the possibility of Lyme disease. The rash may resemble   a target with a bull's-eye and may occur at a different part of your body than the tick bite.  Redness and swelling in the area of the tick bite.   Tender, swollen lymph glands.   Diarrhea.   Weight loss.   Cough.   Fatigue.   Muscle, joint, or bone pain.   Abdominal pain.   Headache.   Lethargy or a change in your level of consciousness.  Difficulty walking or moving your legs.   Numbness in the legs.   Paralysis.  Shortness of breath.   Confusion.   Repeated vomiting.    This information is not intended to replace advice given to you by your health care provider. Make sure you discuss any questions you have with your health  care provider.   Document Released: 03/21/2000 Document Revised: 04/14/2014 Document Reviewed: 09/01/2012 Elsevier Interactive Patient Education 2016 Elsevier Inc.  

## 2015-09-12 ENCOUNTER — Ambulatory Visit: Payer: BLUE CROSS/BLUE SHIELD | Admitting: Gynecologic Oncology

## 2015-10-03 ENCOUNTER — Other Ambulatory Visit: Payer: Self-pay | Admitting: Family Medicine

## 2015-10-03 NOTE — Telephone Encounter (Signed)
Received refill request electronically Last refill 08/08/15 #30/1 Last office visit 09/04/15-acute

## 2015-10-04 NOTE — Telephone Encounter (Signed)
Sent. Thanks.   

## 2015-10-05 ENCOUNTER — Encounter: Payer: Self-pay | Admitting: Gynecologic Oncology

## 2015-10-05 ENCOUNTER — Ambulatory Visit: Payer: BLUE CROSS/BLUE SHIELD | Attending: Gynecologic Oncology | Admitting: Gynecologic Oncology

## 2015-10-05 VITALS — BP 110/61 | HR 82 | Temp 98.5°F | Resp 20 | Ht 63.0 in | Wt 150.6 lb

## 2015-10-05 DIAGNOSIS — Z8249 Family history of ischemic heart disease and other diseases of the circulatory system: Secondary | ICD-10-CM | POA: Diagnosis not present

## 2015-10-05 DIAGNOSIS — F17200 Nicotine dependence, unspecified, uncomplicated: Secondary | ICD-10-CM | POA: Diagnosis not present

## 2015-10-05 DIAGNOSIS — Z841 Family history of disorders of kidney and ureter: Secondary | ICD-10-CM | POA: Insufficient documentation

## 2015-10-05 DIAGNOSIS — Z886 Allergy status to analgesic agent status: Secondary | ICD-10-CM | POA: Diagnosis not present

## 2015-10-05 DIAGNOSIS — D071 Carcinoma in situ of vulva: Secondary | ICD-10-CM | POA: Insufficient documentation

## 2015-10-05 DIAGNOSIS — Z833 Family history of diabetes mellitus: Secondary | ICD-10-CM | POA: Insufficient documentation

## 2015-10-05 DIAGNOSIS — K219 Gastro-esophageal reflux disease without esophagitis: Secondary | ICD-10-CM | POA: Diagnosis not present

## 2015-10-05 DIAGNOSIS — Z888 Allergy status to other drugs, medicaments and biological substances status: Secondary | ICD-10-CM | POA: Insufficient documentation

## 2015-10-05 DIAGNOSIS — Z9889 Other specified postprocedural states: Secondary | ICD-10-CM | POA: Insufficient documentation

## 2015-10-05 DIAGNOSIS — Z803 Family history of malignant neoplasm of breast: Secondary | ICD-10-CM | POA: Diagnosis not present

## 2015-10-05 DIAGNOSIS — F17210 Nicotine dependence, cigarettes, uncomplicated: Secondary | ICD-10-CM | POA: Insufficient documentation

## 2015-10-05 DIAGNOSIS — Z9049 Acquired absence of other specified parts of digestive tract: Secondary | ICD-10-CM | POA: Insufficient documentation

## 2015-10-05 DIAGNOSIS — Z800 Family history of malignant neoplasm of digestive organs: Secondary | ICD-10-CM | POA: Diagnosis not present

## 2015-10-05 NOTE — Patient Instructions (Signed)
Plan to follow up with Dr. Lynnette Caffey in six months and Dr. Denman George in one year.

## 2015-10-05 NOTE — Progress Notes (Signed)
GYN ONC FOLLOW-UP  CC:  Chief Complaint  Patient presents with  . 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   S/p wide local excision left vulva on 01/30/15.   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  No evidence for recurrence of VIN 3.  Tobacco abuse  Plan: Continue close surveillance. 25% risk for recurrence. Return to clinic in 6 months to see Dr Lynnette Caffey and to see me in 12 months. Counseled regarding smoking association with dysplasia and recommendation to quit.   HPI:  Julia [name] is a 50 y.o. year old No obstetric history on file. initially seen in consultation on 01/26/15 referred by Dr Linda Hedges for [facility].  She then underwent a left partial simple vulvectomy on 99991111 without complications.  Her postoperative course was complicated by wound separation.  Her final pathology revealed VIN3 with negative margins. Her incision healed fairly well though she developed some wound separation.  Interval Hx: The patient has done well and denies any pruritis, lumps or irritation. She continues to smoke.  Current Outpatient Prescriptions on File Prior to Visit  Medication Sig Dispense Refill  . atorvastatin (LIPITOR) 10 MG tablet Take 1 tablet (10 mg total) by mouth daily. 90 tablet 3   No current facility-administered medications on file prior to visit.   Allergies  Allergen Reactions  . Propoxyphene N-Acetaminophen Hives  . Nabumetone Rash    She can tolerate ibuprofen w/o difficulty  . Rofecoxib Rash    nightmares   Past Medical History  Diagnosis Date  . Smoking   . GERD (gastroesophageal reflux disease)    Past Surgical History  Procedure Laterality Date  . Cholecystectomy    . Vulvectomy N/A 01/30/2015    Procedure: WIDE LOCAL EXCISION VULVA;  Surgeon: Everitt Amber, MD;  Location: [facility];  Service: Gynecology;  Laterality: N/A;   Social History   Social History  . Marital Status: Married    Spouse Name: N/A  . Number  of Children: 1  . Years of Education: N/A   Occupational History  . 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 Smoking status: Current Every Day Smoker -- 1.00 packs/day for 31 years    Types: Cigarettes  . Smokeless tobacco: Never Used  . Alcohol Use: No  . Drug Use: No  . Sexual Activity: Not on file   Other Topics Concern  . Not on file   Social History Narrative   One son   Married 2000 (had been together since 1984)   Family History  Problem Relation Age of Onset  . Hypertension Mother   . Diabetes Mother     insulin dependent  . Heart disease Mother   . Diabetes Father   . Heart disease Father     CAD  . Hypertension Brother   . Kidney disease Brother     kidney failure/resolved  . Colon cancer Maternal Aunt     dx'd at ~62  . Breast cancer Neg Hx     Review of systems: Constitutional:  She has no weight gain or weight loss. She has no fever or chills. Eyes: No blurred vision Ears, Nose, Mouth, Throat: No dizziness, headaches or changes in hearing. No mouth sores. Cardiovascular: No chest pain, palpitations or edema. Respiratory:  No shortness of breath, wheezing or cough Gastrointestinal: She has normal bowel movements without diarrhea or constipation. She denies any nausea or  vomiting. She denies blood in her stool or heart burn. Genitourinary:  She denies pelvic pain, pelvic pressure or changes in her urinary function. She has no hematuria, dysuria, or incontinence. She has no irregular vaginal bleeding or vaginal discharge Musculoskeletal: Denies muscle weakness or joint pains.  Skin:  She has no skin changes, rashes or itching Neurological:  Denies dizziness or headaches. No neuropathy, no numbness or tingling. Psychiatric:  She denies depression or anxiety. Hematologic/Lymphatic:   No easy bruising or bleeding   Physical Exam: Blood pressure 110/61, pulse 82, temperature 98.5 F (36.9 C), temperature source Oral, resp. rate 20,  height 5\' 3"  (1.6 m), weight 150 lb 9.6 oz (68.312 kg), SpO2 100 %. General: Well dressed, well nourished in no apparent distress.   HEENT:  Normocephalic and atraumatic, no lesions.  Extraocular muscles intact. Sclerae anicteric. Pupils equal, round, reactive. No mouth sores or ulcers. Thyroid is normal size, not nodular, midline. Genitourinary: left labia minora has completely healed. 4% acetic acid applied to tissues. No VIN or invasive carcinoma or apparent lesions identified.  Extremities: No cyanosis, clubbing or edema.  No calf tenderness or erythema. No palpable cords. Psychiatric: Mood and affect are appropriate. Neurological: Awake, alert and oriented x 3. Sensation is intact, no neuropathy.  Musculoskeletal: No pain, normal strength and range of motion.  Julia Eva, MD

## 2015-11-23 ENCOUNTER — Other Ambulatory Visit: Payer: Self-pay | Admitting: Family Medicine

## 2015-11-23 NOTE — Telephone Encounter (Signed)
Electronic refill request. Last Filled:   10/04/15   #30  1 RF.  Last office visit:   09/04/15 /acute.  Please advise.

## 2015-11-25 NOTE — Telephone Encounter (Signed)
Sent. Thanks.   

## 2016-01-28 ENCOUNTER — Encounter: Payer: Self-pay | Admitting: Gynecologic Oncology

## 2016-01-28 ENCOUNTER — Ambulatory Visit: Payer: BLUE CROSS/BLUE SHIELD | Attending: Gynecologic Oncology | Admitting: Gynecologic Oncology

## 2016-01-28 ENCOUNTER — Other Ambulatory Visit: Payer: Self-pay | Admitting: Family Medicine

## 2016-01-28 VITALS — BP 132/67 | HR 80 | Temp 98.6°F | Resp 22 | Ht 63.0 in | Wt 153.4 lb

## 2016-01-28 DIAGNOSIS — Z841 Family history of disorders of kidney and ureter: Secondary | ICD-10-CM | POA: Diagnosis not present

## 2016-01-28 DIAGNOSIS — N904 Leukoplakia of vulva: Secondary | ICD-10-CM | POA: Insufficient documentation

## 2016-01-28 DIAGNOSIS — D071 Carcinoma in situ of vulva: Secondary | ICD-10-CM

## 2016-01-28 DIAGNOSIS — Z716 Tobacco abuse counseling: Secondary | ICD-10-CM | POA: Diagnosis not present

## 2016-01-28 DIAGNOSIS — Z79899 Other long term (current) drug therapy: Secondary | ICD-10-CM | POA: Diagnosis not present

## 2016-01-28 DIAGNOSIS — K219 Gastro-esophageal reflux disease without esophagitis: Secondary | ICD-10-CM | POA: Insufficient documentation

## 2016-01-28 DIAGNOSIS — Z888 Allergy status to other drugs, medicaments and biological substances status: Secondary | ICD-10-CM | POA: Diagnosis not present

## 2016-01-28 DIAGNOSIS — F17210 Nicotine dependence, cigarettes, uncomplicated: Secondary | ICD-10-CM | POA: Diagnosis not present

## 2016-01-28 DIAGNOSIS — Z833 Family history of diabetes mellitus: Secondary | ICD-10-CM | POA: Insufficient documentation

## 2016-01-28 DIAGNOSIS — N909 Noninflammatory disorder of vulva and perineum, unspecified: Secondary | ICD-10-CM | POA: Diagnosis present

## 2016-01-28 DIAGNOSIS — Z87898 Personal history of other specified conditions: Secondary | ICD-10-CM | POA: Diagnosis not present

## 2016-01-28 DIAGNOSIS — Z8249 Family history of ischemic heart disease and other diseases of the circulatory system: Secondary | ICD-10-CM | POA: Diagnosis not present

## 2016-01-28 DIAGNOSIS — R102 Pelvic and perineal pain: Secondary | ICD-10-CM | POA: Diagnosis not present

## 2016-01-28 MED ORDER — HYDROCODONE-ACETAMINOPHEN 5-500 MG PO TABS: 1.0000 | ORAL_TABLET | Freq: Four times a day (QID) | ORAL | 0 refills | Status: DC | PRN

## 2016-01-28 MED ORDER — HYDROCODONE-ACETAMINOPHEN 5-325 MG PO TABS: 1.0000 | ORAL_TABLET | Freq: Four times a day (QID) | ORAL | 0 refills | Status: DC | PRN

## 2016-01-28 NOTE — Patient Instructions (Signed)
We will call you with the results of your biopsies from today.  Please call for any questions or concerns.  VULVAR BIOPSY POST-PROCEDURE INSTRUCTIONS  1. You may take Ibuprofen, Aleve or Tylenol for pain if needed.    2. You may have a small amount of spotting.  You should wear a mini pad for the next few days.  3. You may use some topical Neosporin ointment if you would like (over the counter is fine).  4. You need to call if you have redness around the biopsy site, if there is any unusual draining, if the bleeding is heavy, or if you are concerned.  5. Shower or bathe as normal  6. We will call you within one week with results or we will discuss the results at your follow-up appointment if needed.  Acetaminophen; Hydrocodone tablets or capsules What is this medicine? ACETAMINOPHEN; HYDROCODONE (a set a MEE noe fen; hye droe KOE done) is a pain reliever. It is used to treat moderate to severe pain. This medicine may be used for other purposes; ask your health care provider or pharmacist if you have questions. What should I tell my health care provider before I take this medicine? They need to know if you have any of these conditions: -brain tumor -Crohn's disease, inflammatory bowel disease, or ulcerative colitis -drug abuse or addiction -head injury -heart or circulation problems -if you often drink alcohol -kidney disease or problems going to the bathroom -liver disease -lung disease, asthma, or breathing problems -an unusual or allergic reaction to acetaminophen, hydrocodone, other opioid analgesics, other medicines, foods, dyes, or preservatives -pregnant or trying to get pregnant -breast-feeding How should I use this medicine? Take this medicine by mouth. Swallow it with a full glass of water. Follow the directions on the prescription label. If the medicine upsets your stomach, take the medicine with food or milk. Do not take more than you are told to take. Talk to your  pediatrician regarding the use of this medicine in children. This medicine is not approved for use in children. Patients over 65 years may have a stronger reaction and need a smaller dose. Overdosage: If you think you have taken too much of this medicine contact a poison control center or emergency room at once. NOTE: This medicine is only for you. Do not share this medicine with others. What if I miss a dose? If you miss a dose, take it as soon as you can. If it is almost time for your next dose, take only that dose. Do not take double or extra doses. What may interact with this medicine? -alcohol -antihistamines -isoniazid -medicines for depression, anxiety, or psychotic disturbances -medicines for sleep -muscle relaxants -naltrexone -narcotic medicines (opiates) for pain -phenobarbital -ritonavir -tramadol This list may not describe all possible interactions. Give your health care provider a list of all the medicines, herbs, non-prescription drugs, or dietary supplements you use. Also tell them if you smoke, drink alcohol, or use illegal drugs. Some items may interact with your medicine. What should I watch for while using this medicine? Tell your doctor or health care professional if your pain does not go away, if it gets worse, or if you have new or a different type of pain. You may develop tolerance to the medicine. Tolerance means that you will need a higher dose of the medicine for pain relief. Tolerance is normal and is expected if you take the medicine for a long time. Do not suddenly stop taking your medicine  because you may develop a severe reaction. Your body becomes used to the medicine. This does NOT mean you are addicted. Addiction is a behavior related to getting and using a drug for a non-medical reason. If you have pain, you have a medical reason to take pain medicine. Your doctor will tell you how much medicine to take. If your doctor wants you to stop the medicine, the dose  will be slowly lowered over time to avoid any side effects. You may get drowsy or dizzy when you first start taking the medicine or change doses. Do not drive, use machinery, or do anything that may be dangerous until you know how the medicine affects you. Stand or sit up slowly. There are different types of narcotic medicines (opiates) for pain. If you take more than one type at the same time, you may have more side effects. Give your health care provider a list of all medicines you use. Your doctor will tell you how much medicine to take. Do not take more medicine than directed. Call emergency for help if you have problems breathing. The medicine will cause constipation. Try to have a bowel movement at least every 2 to 3 days. If you do not have a bowel movement for 3 days, call your doctor or health care professional. Too much acetaminophen can be very dangerous. Do not take Tylenol (acetaminophen) or medicines that contain acetaminophen with this medicine. Many non-prescription medicines contain acetaminophen. Always read the labels carefully. What side effects may I notice from receiving this medicine? Side effects that you should report to your doctor or health care professional as soon as possible: -allergic reactions like skin rash, itching or hives, swelling of the face, lips, or tongue -breathing problems -confusion -feeling faint or lightheaded, falls -stomach pain -yellowing of the eyes or skin Side effects that usually do not require medical attention (report to your doctor or health care professional if they continue or are bothersome): -nausea, vomiting -stomach upset This list may not describe all possible side effects. Call your doctor for medical advice about side effects. You may report side effects to FDA at 1-800-FDA-1088. Where should I keep my medicine? Keep out of the reach of children. This medicine can be abused. Keep your medicine in a safe place to protect it from theft.  Do not share this medicine with anyone. Selling or giving away this medicine is dangerous and against the law. This medicine may cause accidental overdose and death if it taken by other adults, children, or pets. Mix any unused medicine with a substance like cat litter or coffee grounds. Then throw the medicine away in a sealed container like a sealed bag or a coffee can with a lid. Do not use the medicine after the expiration date. Store at room temperature between 15 and 30 degrees C (59 and 86 degrees F). NOTE: This sheet is a summary. It may not cover all possible information. If you have questions about this medicine, talk to your doctor, pharmacist, or health care provider.    2016, Elsevier/Gold Standard. (2014-02-22 15:29:20)

## 2016-01-28 NOTE — Telephone Encounter (Signed)
Electronic refill request. Last Filled:    30 tablet 2 11/25/2015  Please advise.

## 2016-01-28 NOTE — Progress Notes (Signed)
GYN ONC FOLLOW-UP  CC:  Chief Complaint  Patient presents with  . 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   S/p wide local excision left vulva on 01/30/15.   New vulvar pain - acetowhite satellite lesions at inferior aspect of old incision and erythema circumferential introitus.   Plan: Will follow-up biopsies. If VIN 3 - recommend CO2 laser. If no dysplasia, would recommend steroid (topical). At patient's request prescribed vicodin. Will prescribe 20, however no additional tabs will be prescribed until after procedure as I do not see a good source for pain such to require opioids. Tobacco abuse : Counseled regarding smoking association with dysplasia and recommendation to quit. Follow-up in 6 months with Dr Lynnette Caffey.  HPI:  Julia [name] is a 50 y.o. year old No obstetric history on file. initially seen in consultation on 01/26/15 referred by Dr Linda Hedges for [facility].  She then underwent a left partial simple vulvectomy on 99991111 without complications.  Her postoperative course was complicated by wound separation.  Her final pathology revealed VIN3 with negative margins. Her incision healed fairly well though she developed some wound separation.  Interval Hx: She continues to smoke. She has a 2 week history of severe left vulvar pain (no pruritis).  Current Outpatient Prescriptions on File Prior to Visit  Medication Sig Dispense Refill  . atorvastatin (LIPITOR) 10 MG tablet Take 1 tablet (10 mg total) by mouth daily. 90 tablet 3  . ibuprofen (ADVIL,MOTRIN) 600 MG tablet TAKE 1 TABLET BY MOUTH EVERY 8 HOURS AS NEEDED. WITH FOOD. 30 tablet 2   No current facility-administered medications on file prior to visit.    Allergies  Allergen Reactions  . Propoxyphene N-Acetaminophen Hives  . Nabumetone Rash    She can tolerate ibuprofen w/o difficulty  . Rofecoxib Rash    nightmares   Past Medical History:  Diagnosis Date  . GERD  (gastroesophageal reflux disease)   . Smoking    Past Surgical History:  Procedure Laterality Date  . CHOLECYSTECTOMY    . VULVECTOMY N/A 01/30/2015   Procedure: WIDE LOCAL EXCISION VULVA;  Surgeon: Everitt Amber, MD;  Location: [facility];  Service: Gynecology;  Laterality: N/A;   Social History   Social History  . Marital status: Married    Spouse name: N/A  . Number of children: 1  . Years of education: N/A   Occupational History  . 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 Smoking status: Current Every Day Smoker    Packs/day: 1.00    Years: 31.00    Types: Cigarettes  . Smokeless tobacco: Never Used  . Alcohol use No  . Drug use: No  . Sexual activity: Not on file   Other Topics Concern  . Not on file   Social History Narrative   One son   Married 2000 (had been together since 1984)   Family History  Problem Relation Age of Onset  . Hypertension Mother   . Diabetes Mother     insulin dependent  . Heart disease Mother   . Diabetes Father   . Heart disease Father     CAD  . Hypertension Brother   . Kidney disease Brother     kidney failure/resolved  . Colon cancer Maternal Aunt     dx'd at ~62  . Breast cancer Neg Hx     Review of systems: Constitutional:  She has no weight gain or weight loss. She has no fever or chills. Eyes: No blurred vision Ears, Nose, Mouth, Throat: No dizziness, headaches or changes in hearing. No mouth sores. Cardiovascular: No chest pain, palpitations or edema. Respiratory:  No shortness of breath, wheezing or cough Gastrointestinal: She has normal bowel movements without diarrhea or constipation. She denies any nausea or vomiting. She denies blood in her stool or heart burn. Genitourinary:  She denies pelvic pain, pelvic pressure or changes in her urinary function. She has no hematuria, dysuria, or incontinence. She has no irregular vaginal bleeding or vaginal discharge + intense left vulva pain Musculoskeletal:  Denies muscle weakness or joint pains.  Skin:  She has no skin changes, rashes or itching Neurological:  Denies dizziness or headaches. No neuropathy, no numbness or tingling. Psychiatric:  She denies depression or anxiety. Hematologic/Lymphatic:   No easy bruising or bleeding   Physical Exam: Blood pressure 132/67, pulse 80, temperature 98.6 F (37 C), temperature source Oral, resp. rate (!) 22, height 5\' 3"  (1.6 m), weight 153 lb 6.4 oz (69.6 kg). General: Well dressed, well nourished in no apparent distress.   HEENT:  Normocephalic and atraumatic, no lesions.  Extraocular muscles intact. Sclerae anicteric. Pupils equal, round, reactive. No mouth sores or ulcers. Thyroid is normal size, not nodular, midline. Genitourinary: left labia minora has completely healed. 4% acetic acid applied to tissues. 2cm area of acetowhite speckles seen just inferior to prior incision on left labia minora. Around the introitus anteriorally is erythema. Extremities: No cyanosis, clubbing or edema.  No calf tenderness or erythema. No palpable cords. Psychiatric: Mood and affect are appropriate. Neurological: Awake, alert and oriented x 3. Sensation is intact, no neuropathy.  Musculoskeletal: No pain, normal strength and range of motion.  Procedure Note: vulvar biopsy Patient provided verbal consent. The skin was cleaned with betadine. 1cc of 1% plain lidocaine was infiltrated into the left labia and 2 biopsies were taken (one from acetowhite changes more laterally (underneath prior incision) and one from the left vaginal introitus representative of the erythematous skin. Silver nitrate created hemostasis. Specimens sent for pathology.  Donaciano Eva, MD

## 2016-01-29 ENCOUNTER — Encounter: Payer: Self-pay | Admitting: Gynecologic Oncology

## 2016-01-29 NOTE — Telephone Encounter (Signed)
Sent. Thanks.   

## 2016-01-30 ENCOUNTER — Other Ambulatory Visit: Payer: Self-pay | Admitting: Gynecologic Oncology

## 2016-01-30 ENCOUNTER — Telehealth: Payer: Self-pay | Admitting: Gynecologic Oncology

## 2016-01-30 DIAGNOSIS — N762 Acute vulvitis: Secondary | ICD-10-CM

## 2016-01-30 MED ORDER — CLOBETASOL PROPIONATE 0.05 % EX OINT: 1.0000 "application " | TOPICAL_OINTMENT | Freq: Every day | CUTANEOUS | 1 refills | Status: DC

## 2016-01-30 MED ORDER — CLOBETASOL PROPIONATE 0.05 % EX CREA: 1.0000 "application " | TOPICAL_CREAM | Freq: Every day | CUTANEOUS | 1 refills | Status: DC

## 2016-01-30 MED ORDER — CLOBETASOL PROPIONATE 0.05 % EX GEL: 1.0000 "application " | Freq: Every day | CUTANEOUS | 1 refills | Status: DC

## 2016-01-30 NOTE — Telephone Encounter (Signed)
Informed patient of biopsy results and Dr. Serita Grit recommendations to try clobetasol cream for her symptoms.  She is advised to call our office with an update.  States the area still hurts but is somewhat better.  Advised to call for any needs or concerns.

## 2016-02-01 ENCOUNTER — Other Ambulatory Visit: Payer: Self-pay | Admitting: Family Medicine

## 2016-03-13 ENCOUNTER — Ambulatory Visit (INDEPENDENT_AMBULATORY_CARE_PROVIDER_SITE_OTHER): Payer: BLUE CROSS/BLUE SHIELD | Admitting: Family Medicine

## 2016-03-13 ENCOUNTER — Encounter: Payer: Self-pay | Admitting: Family Medicine

## 2016-03-13 VITALS — BP 122/62 | HR 81 | Temp 98.5°F | Wt 153.0 lb

## 2016-03-13 DIAGNOSIS — R197 Diarrhea, unspecified: Secondary | ICD-10-CM | POA: Diagnosis not present

## 2016-03-13 DIAGNOSIS — G8929 Other chronic pain: Secondary | ICD-10-CM

## 2016-03-13 DIAGNOSIS — M79673 Pain in unspecified foot: Secondary | ICD-10-CM | POA: Diagnosis not present

## 2016-03-13 LAB — CBC
HCT: 43.2 % (ref 36.0–46.0)
Hemoglobin: 15.2 g/dL — ABNORMAL HIGH (ref 12.0–15.0)
MCHC: 35.1 g/dL (ref 30.0–36.0)
MCV: 93.4 fl (ref 78.0–100.0)
Platelets: 280 10*3/uL (ref 150.0–400.0)
RBC: 4.62 Mil/uL (ref 3.87–5.11)
RDW: 12.8 % (ref 11.5–15.5)
WBC: 11.6 10*3/uL — ABNORMAL HIGH (ref 4.0–10.5)

## 2016-03-13 LAB — COMPREHENSIVE METABOLIC PANEL
ALT: 20 U/L (ref 0–35)
AST: 13 U/L (ref 0–37)
Albumin: 4.5 g/dL (ref 3.5–5.2)
Alkaline Phosphatase: 96 U/L (ref 39–117)
BUN: 9 mg/dL (ref 6–23)
CO2: 30 mEq/L (ref 19–32)
Calcium: 9.9 mg/dL (ref 8.4–10.5)
Chloride: 104 mEq/L (ref 96–112)
Creatinine, Ser: 0.68 mg/dL (ref 0.40–1.20)
GFR: 96.7 mL/min (ref >60.00)
Glucose, Bld: 122 mg/dL — ABNORMAL HIGH (ref 70–99)
Potassium: 3.9 mEq/L (ref 3.5–5.1)
Sodium: 141 mEq/L (ref 135–145)
Total Bilirubin: 0.5 mg/dL (ref 0.2–1.2)
Total Protein: 7.4 g/dL (ref 6.0–8.3)

## 2016-03-13 LAB — VITAMIN B12: Vitamin B-12: 420 pg/mL (ref 211–911)

## 2016-03-13 LAB — TSH: TSH: 0.82 u[IU]/mL (ref 0.35–4.50)

## 2016-03-13 MED ORDER — GABAPENTIN 100 MG PO CAPS: ORAL_CAPSULE | ORAL | 3 refills | Status: DC

## 2016-03-13 NOTE — Progress Notes (Signed)
Pre visit review using our clinic review tool, if applicable. No additional management support is needed unless otherwise documented below in the visit note. 

## 2016-03-13 NOTE — Progress Notes (Signed)
Subjective:    Patient ID: Julia [name], female    DOB: [birth year], 50 y.o.   MRN: [id number]  HPI This is a 50 yo female who presents today with diarrhea x 2 weeks. She started with diarrhea and vomiting which seemed to get better then got worse today. She was not able to make it to the toilet in time. Diarrhea is brown and loose. Had 3 bowel movements yesterday, 2 today. Had formed stools several days ago. No fever, feels tired. Abdominal pain comes and goes, relieved with BM. No night time diarrhea. Has not taken anything for diarrhea. No recent antibiotics, no recent hospitalizations, no sick contacts.  Works at [employer].  Feet have been hurting off and on x 1 year. Takes ibuprofen 600 mg every 6-8 hours for foot pain without significant relief. Pain is stabbing, intermittent, interferes with sleep. No polyuria, no polydipsia.   Past Medical History:  Diagnosis Date  . GERD (gastroesophageal reflux disease)   . Smoking    Past Surgical History:  Procedure Laterality Date  . CHOLECYSTECTOMY    . VULVECTOMY N/A 01/30/2015   Procedure: WIDE LOCAL EXCISION VULVA;  Surgeon: Julia Amber, MD;  Location: [facility];  Service: Gynecology;  Laterality: N/A;   Family History  Problem Relation Age of Onset  . Hypertension Mother   . Diabetes Mother     insulin dependent  . Heart disease Mother   . Diabetes Father   . Heart disease Father     CAD  . Hypertension Brother   . Kidney disease Brother     kidney failure/resolved  . Colon cancer Maternal Aunt     dx'd at ~62  . Breast cancer Neg Hx    Social History  Substance Use Topics  . Smoking status: Current Every Day Smoker    Packs/day: 1.00    Years: 31.00    Types: Cigarettes  . Smokeless tobacco: Never Used  . Alcohol use No      Review of Systems Per HPI    Objective:   Physical Exam  Constitutional: She is oriented to person, place, and time. She appears well-developed.  HENT:  Head: Normocephalic and atraumatic.    Mouth/Throat: Oropharynx is clear and moist.  Eyes: Conjunctivae are normal.  Cardiovascular: Normal rate, regular rhythm and normal heart sounds.   Pulmonary/Chest: Effort normal and breath sounds normal.  Abdominal: Soft. Bowel sounds are normal. She exhibits no distension. There is tenderness (generalized with deep palpation. ). There is no rebound and no guarding.  Musculoskeletal:  Soles of feet with erythema. Palpable PT/DP pulses, no edema, no other discoloration, sensation intact, warm to touch.   Neurological: She is alert and oriented to person, place, and time.  Skin: Skin is warm and dry.  Psychiatric: She has a normal mood and affect. Her behavior is normal. Judgment and thought content normal.  Vitals reviewed.     BP 122/62 (BP Location: Right Arm, Patient Position: Sitting, Cuff Size: Normal)   Pulse 81   Temp 98.5 F (36.9 C) (Oral)   Wt 153 lb (69.4 kg)   SpO2 95%   BMI 27.10 kg/m  Wt Readings from Last 3 Encounters:  03/13/16 153 lb (69.4 kg)  01/28/16 153 lb 6.4 oz (69.6 kg)  10/05/15 150 lb 9.6 oz (68.3 kg)       Assessment & Plan:  1. Diarrhea, unspecified type - CBC - Comprehensive metabolic panel - TSH - Vitamin B12 - immodium, bland diet, encouraged adequate  fluids - RTC precautions reviewed  2. Chronic foot pain, unspecified laterality - unsure of cause, exam unremarkable other than erythema of soles. Encouraged her to back off of ibuprofen if it not helping and can try course of gabapentin. - gabapentin (NEURONTIN) 100 MG capsule; Take one capsule at bedtime for 7 days then take 1 capsule twice a day.  Dispense: 90 capsule; Refill: 3  - follow up with Dr. Damita [name] for CPE  Julia Reamer, FNP-BC  [facility], [facility]  03/13/2016 12:52 PM

## 2016-03-13 NOTE — Patient Instructions (Signed)
For diarrhea, you can take immodium 2-4 tablets daily Continue to eat bland foods and drink enough liquids to make your urine light yellow  If you get worse, come back in to see [place] I will notify you with results of labs Please schedule an appointment with Dr. Brigitte Pulse for 4-6 weeks

## 2016-03-22 ENCOUNTER — Other Ambulatory Visit: Payer: Self-pay | Admitting: Family Medicine

## 2016-04-03 ENCOUNTER — Telehealth: Payer: Self-pay | Admitting: Family Medicine

## 2016-04-03 ENCOUNTER — Other Ambulatory Visit: Payer: Self-pay | Admitting: Family Medicine

## 2016-04-03 NOTE — Telephone Encounter (Signed)
[facility] Patient Name: Julia [name] DOB: [birth year] Initial Comment Caller's feet have been hurting. Meds not helping. Nurse Assessment Nurse: Martyn Ehrich RN, Felicia Date/Time (Eastern Time): 04/03/2016 4:39:31 PM Confirm and document reason for call. If symptomatic, describe symptoms. ---PT has pain in feet and medication is not helping. Gabapentin is the medication. Prescribed 03/13/16 by another MD in same office. Does the patient have any new or worsening symptoms? ---Yes Will a triage be completed? ---Yes Related visit to physician within the last 2 weeks? ---Sharyne Richters the PT have any chronic conditions? (i.e. diabetes, asthma, etc.) ---No Is the patient pregnant or possibly pregnant? (Ask all females between the ages of 41-55) ---No Is this a behavioral health or substance abuse call? ---No Guidelines Guideline Title Affirmed Question Affirmed Notes Foot Pain [1] SEVERE pain (e.g., excruciating, unable to do any normal activities) AND [2] not improved after 2 hours of pain medicine Final Disposition User See Physician within 4 Hours (or PCP triage) Martyn Ehrich, RN, Felicia Comments pt is not sure why her feet hurt and only other medication is for cholesterol. Pain onset 6 mo. ago level 10 now Referrals REFERRED TO PCP OFFICE [facility] [place] Disagree/Comply: Comply Call Id: [id number]

## 2016-04-04 ENCOUNTER — Telehealth: Payer: Self-pay

## 2016-04-04 NOTE — Telephone Encounter (Signed)
Electronic refill request. Last Filled:    30 tablet 2 01/29/2016  Last office visit:   03/13/16  Please advise.

## 2016-04-04 NOTE — Telephone Encounter (Signed)
Error - duplicate

## 2016-04-04 NOTE — Telephone Encounter (Signed)
Left detailed message on voicemail to return call with info. 

## 2016-04-04 NOTE — Telephone Encounter (Signed)
Will see at pending OV.  See if/what details you can get in the meantime.  Thanks.

## 2016-04-04 NOTE — Telephone Encounter (Signed)
Sent. Thanks.   

## 2016-04-04 NOTE — Telephone Encounter (Signed)
Unable to reach pt by phone.

## 2016-04-09 ENCOUNTER — Ambulatory Visit: Payer: BLUE CROSS/BLUE SHIELD | Admitting: Family Medicine

## 2016-04-25 ENCOUNTER — Telehealth: Payer: Self-pay | Admitting: *Deleted

## 2016-04-25 MED ORDER — VALACYCLOVIR HCL 1 G PO TABS: 1000.0000 mg | ORAL_TABLET | Freq: Three times a day (TID) | ORAL | 0 refills | Status: DC

## 2016-04-25 NOTE — Telephone Encounter (Signed)
Left message on patient's mobile vm.

## 2016-04-25 NOTE — Telephone Encounter (Signed)
Patient left a voicemail stating that she feels that she is getting shingles again. Patient stated that they are going down her right arm. Patient stated that she does not have any  Insurance and wants to know what you recommend?

## 2016-04-25 NOTE — Telephone Encounter (Signed)
If she has a new rash down the arm, then restart valtrex.  rx sent, to start if new rash.  If this is return of old pain, then I would add on an extra gabapentin pill per day and have her update me Monday.  Thanks.

## 2016-04-28 NOTE — Telephone Encounter (Signed)
Called patient back and was advised that she did develop a rash on her right arm and under her breast.  Patient stated that she started taking the Valtrex and is doing some better today. Advised patient to call back and let Dr. Damita Dunnings know if she does not continue to improve which she agreed.

## 2016-04-28 NOTE — Telephone Encounter (Signed)
Patient notified as instructed by telephone and verbalized understanding. Got cut off and tried to call patient back several times and continued to get voicemail. Left another message for patient to call back.

## 2016-04-28 NOTE — Telephone Encounter (Signed)
Thanks

## 2016-05-24 ENCOUNTER — Other Ambulatory Visit: Payer: Self-pay | Admitting: Family Medicine

## 2016-05-24 NOTE — Telephone Encounter (Signed)
Last office visit 03/13/16 with D. Carlean Purl.  Last refilled 04/04/2106 for #30 with 2 refills.  Ok to refill?

## 2016-05-24 NOTE — Telephone Encounter (Signed)
Sent. Thanks.   

## 2016-06-05 ENCOUNTER — Emergency Department (HOSPITAL_COMMUNITY)
Admission: EM | Admit: 2016-06-05 | Discharge: 2016-06-05 | Disposition: A | Discharge status: Home / Self Care | Payer: BLUE CROSS/BLUE SHIELD | Attending: Emergency Medicine | Admitting: Emergency Medicine

## 2016-06-05 ENCOUNTER — Encounter (HOSPITAL_COMMUNITY): Payer: Self-pay | Admitting: Emergency Medicine

## 2016-06-05 DIAGNOSIS — M79604 Pain in right leg: Secondary | ICD-10-CM

## 2016-06-05 DIAGNOSIS — F17210 Nicotine dependence, cigarettes, uncomplicated: Secondary | ICD-10-CM | POA: Insufficient documentation

## 2016-06-05 DIAGNOSIS — B029 Zoster without complications: Secondary | ICD-10-CM | POA: Insufficient documentation

## 2016-06-05 MED ORDER — VALACYCLOVIR HCL 1 G PO TABS: 1000.0000 mg | ORAL_TABLET | Freq: Three times a day (TID) | ORAL | 0 refills | Status: DC

## 2016-06-05 MED ORDER — HYDROCODONE-ACETAMINOPHEN 5-325 MG PO TABS: 1.0000 | ORAL_TABLET | Freq: Four times a day (QID) | ORAL | 0 refills | Status: DC | PRN

## 2016-06-05 MED ORDER — HYDROCODONE-ACETAMINOPHEN 5-325 MG PO TABS
1.0000 | ORAL_TABLET | Freq: Once | ORAL | Status: AC
  Administered 2016-06-05: 1 via ORAL
  Filled 2016-06-05: qty 1

## 2016-06-05 NOTE — Discharge Instructions (Signed)
Keep rash clean and dry. May consider using over the counter hydrocortisone cream or benadryl as needed for itching. Take valtrex as directed until finished. Use ibuprofen and norco as directed, as needed for pain but do not drive or operate machinery with pain medication use. Follow up with your Primary Care doctor in 1 week for recheck of symptoms. Monitor area for signs of infection to include, but not limited to: increasing pain, spreading redness, drainage/pus, worsening swelling, or fevers. Return to emergency department for emergent changing or worsening symptoms.

## 2016-06-05 NOTE — ED Provider Notes (Signed)
[facility] DEPT Provider Note   CSN: [id number] Arrival date & time: 06/05/16  0455     History   Chief Complaint Chief Complaint  Patient presents with  . Insect Bite    leg rash    HPI Julia [name] is a 51 y.o. female with a PMHx of GERD, tobacco use, plantar fasciitis, shingles, and HLD, who presents to the ED with complaints of pruritic and painful right anterior thigh rash that began on Monday, today being day 3 of symptoms. She states this feels like when she's had shingles previously, states this is the fifth time she's had shingles. She reports that the rash is itchy, only occurring on her right anterior thigh, and painful. She states it started with some small blisters with mild erythema, which ruptured and a clear fluid came out, then scabbed over. No ongoing drainage. She describes the pain as 7/10 constant nonradiating burning soreness around the area of the rash on her R anterior thigh, worse with clothing touching her skin and standing, and unrelieved with 600 mg ibuprofen and Tylenol. No known alleviating factors. She has not tried anything for the itching. In the past she has used Valtrex when she had shingles. She denies any new changes in soaps, detergents, lotions, animal or plant contact, changes in medicines, or any sick contacts or affected individuals at home. She denies any drainage, warmth, red streaking, leg swelling, fevers, chills, CP, SOB, abd pain, N/V/D/C, hematuria, dysuria, arthralgias, numbness, tingling, focal weakness, or any other complaints at this time.    The history is provided by the patient and medical records. No language interpreter was used.  Rash   This is a recurrent problem. The current episode started 2 days ago (on day 3 today). The problem has not changed since onset.The problem is associated with nothing. There has been no fever. The rash is present on the right upper leg. The pain is at a severity of 7/10. The pain is moderate. The  pain has been constant since onset. Associated symptoms include blisters, itching and pain. Pertinent negatives include no weeping. She has tried OTC analgesics for the symptoms. The treatment provided no relief.    Past Medical History:  Diagnosis Date  . GERD (gastroesophageal reflux disease)   . Smoking     Patient Active Problem List   Diagnosis Date Noted  . Dysuria 05/30/2015  . Conjunctivitis 05/30/2015  . 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 Advance care planning 12/22/2014  . Plantar fasciitis 10/12/2014  . Shingles 11/14/2012  . Routine general medical examination at a health care facility 05/07/2011  . SKIN LESION 06/22/2008  . TOBACCO ABUSE 01/27/2007  . HYPERLIPIDEMIA, MIXED 12/14/2006  . HYPERGLYCEMIA 06/05/2006    Past Surgical History:  Procedure Laterality Date  . CHOLECYSTECTOMY    . VULVECTOMY N/A 01/30/2015   Procedure: WIDE LOCAL EXCISION VULVA;  Surgeon: Everitt Amber, MD;  Location: [facility];  Service: Gynecology;  Laterality: N/A;    OB History    No data available       Home Medications    Prior to Admission medications   Medication Sig Start Date End Date Taking? Authorizing Provider  atorvastatin (LIPITOR) 10 MG tablet TAKE 1 TABLET BY MOUTH EVERY DAY 03/24/16   Tonia Ghent, MD  gabapentin (NEURONTIN) 100 MG capsule Take one capsule at bedtime for 7 days then take 1 capsule twice a day. 03/13/16   Elby Beck, FNP  ibuprofen (ADVIL,MOTRIN) 600 MG tablet  TAKE 1 TABLET BY MOUTH EVERY 8 HOURS AS NEEDED. WITH FOOD. 05/24/16   Tonia Ghent, MD  valACYclovir (VALTREX) 1000 MG tablet Take 1 tablet (1,000 mg total) by mouth 3 (three) times daily. 04/25/16   Tonia Ghent, MD    Family History Family History  Problem Relation Age of Onset  . Hypertension Mother   . Diabetes Mother     insulin dependent  . Heart disease Mother   . Diabetes Father   . Heart disease Father     CAD  . Hypertension Brother   .  Kidney disease Brother     kidney failure/resolved  . Colon cancer Maternal Aunt     dx'd at ~62  . Breast cancer Neg Hx     Social History Social History  Substance Use Topics  . Smoking status: Current Every Day Smoker    Packs/day: 1.00    Years: 31.00    Types: Cigarettes  . Smokeless tobacco: Never Used  . Alcohol use No     Allergies   Propoxyphene n-acetaminophen; Nabumetone; and Rofecoxib   Review of Systems Review of Systems  Constitutional: Negative for chills and fever.  Respiratory: Negative for shortness of breath.   Cardiovascular: Negative for chest pain and leg swelling.  Gastrointestinal: Negative for abdominal pain, constipation, diarrhea, nausea and vomiting.  Genitourinary: Negative for dysuria and hematuria.  Musculoskeletal: Positive for myalgias (R anterior thigh pain, around rash). Negative for arthralgias.  Skin: Positive for itching and rash.       No warmth, drainage, red streaking to rash  Allergic/Immunologic: Negative for immunocompromised state.  Neurological: Negative for weakness and numbness.  Psychiatric/Behavioral: Negative for confusion.   10 Systems reviewed and are negative for acute change except as noted in the HPI.   Physical Exam Updated Vital Signs BP 150/68 (BP Location: Right Arm)   Pulse 96   Temp 97.7 F (36.5 C) (Oral)   Resp 18   Ht 5\' 3"  (1.6 m)   Wt 65.8 kg   SpO2 99%   BMI 25.69 kg/m   Physical Exam  Constitutional: She is oriented to person, place, and time. Vital signs are normal. She appears well-developed and well-nourished.  Non-toxic appearance. No distress.  Afebrile, nontoxic, NAD  HENT:  Head: Normocephalic and atraumatic.  Mouth/Throat: Mucous membranes are normal.  Eyes: Conjunctivae and EOM are normal. Right eye exhibits no discharge. Left eye exhibits no discharge.  Neck: Normal range of motion. Neck supple.  Cardiovascular: Normal rate and intact distal pulses.   Pulmonary/Chest: Effort  normal. No respiratory distress.  Abdominal: Normal appearance. She exhibits no distension.  Musculoskeletal: Normal range of motion.       Right upper leg: She exhibits tenderness. She exhibits no bony tenderness, no swelling, no edema and no deformity.  R anterior thigh with vesicular rash with dermatomal distribution, as pictured below and described below. R leg with FROM intact in all joints, skin around rash mildly TTP but no focal bony TTP, no joint or leg swelling, no pedal edema, neg homan's sign bilaterally, no crepitus or deformity, Strength and sensation grossly intact, distal pulses intact, compartments soft.   Neurological: She is alert and oriented to person, place, and time. She has normal strength. No sensory deficit.  Skin: Skin is warm, dry and intact. Rash noted. Rash is vesicular.  Multiple small vesicular lesions to anterior R thigh along dermatomal distribution, some of which have ruptured and scabbed, mildly erythematous without red streaking, no warmth,  no drainage or swelling, no abscess or evidence of cellulitis. SEE PICTURE BELOW.   Psychiatric: She has a normal mood and affect. Her behavior is normal.  Nursing note and vitals reviewed.      ED Treatments / Results  Labs (all labs ordered are listed, but only abnormal results are displayed) Labs Reviewed - No data to display  EKG  EKG Interpretation None       Radiology No results found.  Procedures Procedures (including critical care time)  Medications Ordered in ED Medications  HYDROcodone-acetaminophen (NORCO/VICODIN) 5-325 MG per tablet 1 tablet (1 tablet Oral Given 06/05/16 0603)     Initial Impression / Assessment and Plan / ED Course  I have reviewed the triage vital signs and the nursing notes.  Pertinent labs & imaging results that were available during my care of the patient were reviewed by me and considered in my medical decision making (see chart for details).     51 y.o. female here  with R thigh rash and pain onset Monday (today is day 3 of symptoms). States it feels and looks like her prior shingles rash. On exam, several small vesicles seen, with a few that have already ruptured and scabbed, mildly erythematous, along dermatomal pattern on R anterior thigh. No evidence of secondary bacterial or fungal infection, no drainage/warmth/abscess/cellulitis. Moderate tender to palpation of the skin around the lesions. No LE swelling, NVI with soft compartments.  Pt has previously used valtrex for her shingles, will start this; will also rx pain meds. [facility] reviewed prior to dispensing controlled substance medications, and was notable for: one hydrocodone-APAP 5-325mg  rx for #20 tabs on 01/29/16 but no other controlled substances in 1 year review. Risks/benefits/alternatives and expectations discussed regarding controlled substances. Side effects of medications discussed. Informed consent obtained. Discussed use of additional ibuprofen as well. OTC hydrocortisone or benadryl as needed for itching. F/up with PCP in 1wk. I explained the diagnosis and have given explicit precautions to return to the ER including for any other new or worsening symptoms. The patient understands and accepts the medical plan as it's been dictated and I have answered their questions. Discharge instructions concerning home care and prescriptions have been given. The patient is STABLE and is discharged to home in good condition.   Final Clinical Impressions(s) / ED Diagnoses   Final diagnoses:  Herpes zoster without complication  Leg pain, anterior, right    New Prescriptions New Prescriptions   HYDROCODONE-ACETAMINOPHEN (NORCO) 5-325 MG TABLET    Take 1 tablet by mouth every 6 (six) hours as needed for severe pain.   VALACYCLOVIR (VALTREX) 1000 MG TABLET    Take 1 tablet (1,000 mg total) by mouth 3 (three) times daily.     [address], PA-C 06/05/16 [id number]    Everlene Balls, MD 06/05/16 [phone]

## 2016-06-05 NOTE — ED Triage Notes (Signed)
Pt states she noticed on Monday some bumps on her right leg and is getting progressively worse with itchiness and swollen on her leg, 7/10 pain pt states even her pants hurt her. Denies any fever or chills

## 2016-06-11 ENCOUNTER — Other Ambulatory Visit: Payer: Self-pay

## 2016-06-11 DIAGNOSIS — M79673 Pain in unspecified foot: Principal | ICD-10-CM

## 2016-06-11 DIAGNOSIS — G8929 Other chronic pain: Secondary | ICD-10-CM

## 2016-06-11 MED ORDER — HYDROCODONE-ACETAMINOPHEN 5-325 MG PO TABS: 1.0000 | ORAL_TABLET | Freq: Four times a day (QID) | ORAL | 0 refills | Status: DC | PRN

## 2016-06-11 NOTE — Telephone Encounter (Signed)
I can only give her 12 pills for now  Will cc to PCP Px printed for pick up in IN box

## 2016-06-11 NOTE — Telephone Encounter (Signed)
Pt left v/m requesting rx hydrocodone apap. Call when ready for pick up. Pt last seen [place] on 06/05/16 for shingles. Pt cannot schedule f/u appt because does not get ins until July 2018. Dr Damita Dunnings out of office and pt is out of med. Pt request cb when rx ready for pick up.

## 2016-06-11 NOTE — Telephone Encounter (Signed)
She if she is staking gabapentin, if so how much, and let me know.  We may be able to change that dose for the pain.  Thanks.

## 2016-06-11 NOTE — Telephone Encounter (Signed)
Pt notified Rx ready for pick up and advise Dr. Glori Bickers can only give 12 tabs

## 2016-06-13 NOTE — Telephone Encounter (Signed)
Lm on pts vm requesting a call back 

## 2016-06-18 NOTE — Telephone Encounter (Signed)
Spoke to patient by telephone and was advised that she is taking 1-2 Gabapentin at bedtime.

## 2016-06-19 MED ORDER — GABAPENTIN 100 MG PO CAPS: ORAL_CAPSULE | ORAL | 3 refills | Status: DC

## 2016-06-19 NOTE — Telephone Encounter (Signed)
Patient aware, and will try increasing gabapentin.

## 2016-06-19 NOTE — Telephone Encounter (Signed)
Call pt.  Okay to take up to 3 tabs of gabapentin up to twice a day for pain, assuming she tolerates the medicine w/o dizziness.  She can gradually increase the dose by about 100mg  per day.  I wouldn't go straight to 300mg  BID.  See if that helps/is tolerable. Thanks. Please send new rx if needed.

## 2016-06-19 NOTE — Addendum Note (Signed)
Addended by: Tonia Ghent on: 06/19/2016 06:37 AM   Modules accepted: Orders

## 2016-07-03 ENCOUNTER — Emergency Department (HOSPITAL_COMMUNITY): Payer: BLUE CROSS/BLUE SHIELD

## 2016-07-03 ENCOUNTER — Other Ambulatory Visit: Payer: Self-pay | Admitting: Family Medicine

## 2016-07-03 ENCOUNTER — Encounter (HOSPITAL_COMMUNITY): Payer: Self-pay | Admitting: Emergency Medicine

## 2016-07-03 DIAGNOSIS — Z5321 Procedure and treatment not carried out due to patient leaving prior to being seen by health care provider: Secondary | ICD-10-CM | POA: Insufficient documentation

## 2016-07-03 DIAGNOSIS — F17210 Nicotine dependence, cigarettes, uncomplicated: Secondary | ICD-10-CM | POA: Insufficient documentation

## 2016-07-03 DIAGNOSIS — R0602 Shortness of breath: Secondary | ICD-10-CM | POA: Insufficient documentation

## 2016-07-03 DIAGNOSIS — Z79899 Other long term (current) drug therapy: Secondary | ICD-10-CM | POA: Insufficient documentation

## 2016-07-03 DIAGNOSIS — R079 Chest pain, unspecified: Secondary | ICD-10-CM | POA: Insufficient documentation

## 2016-07-03 LAB — BASIC METABOLIC PANEL
Anion gap: 12 (ref 5–15)
BUN: 13 mg/dL (ref 6–20)
CO2: 24 mmol/L (ref 22–32)
Calcium: 9.9 mg/dL (ref 8.9–10.3)
Chloride: 103 mmol/L (ref 101–111)
Creatinine, Ser: 0.82 mg/dL (ref 0.44–1.00)
GFR calc Af Amer: >60 mL/min (ref >60)
GFR calc non Af Amer: >60 mL/min (ref >60)
Glucose, Bld: 131 mg/dL — ABNORMAL HIGH (ref 65–99)
Potassium: 3.8 mmol/L (ref 3.5–5.1)
Sodium: 139 mmol/L (ref 135–145)

## 2016-07-03 LAB — CBC
HCT: 40.3 % (ref 36.0–46.0)
Hemoglobin: 14 g/dL (ref 12.0–15.0)
MCH: 32.9 pg (ref 26.0–34.0)
MCHC: 34.7 g/dL (ref 30.0–36.0)
MCV: 94.6 fL (ref 78.0–100.0)
Platelets: 236 10*3/uL (ref 150–400)
RBC: 4.26 MIL/uL (ref 3.87–5.11)
RDW: 13.5 % (ref 11.5–15.5)
WBC: 11.5 10*3/uL — ABNORMAL HIGH (ref 4.0–10.5)

## 2016-07-03 LAB — I-STAT TROPONIN, ED: Troponin i, poc: 0 ng/mL (ref 0.00–0.08)

## 2016-07-03 NOTE — ED Triage Notes (Signed)
Patient with chest pain that is in the center of her chest.  She does have some shortness of breath with the pain.  Patient did vomit x1 at home.

## 2016-07-04 ENCOUNTER — Emergency Department (HOSPITAL_COMMUNITY)
Admission: EM | Admit: 2016-07-04 | Discharge: 2016-07-04 | Disposition: A | Discharge status: Home / Self Care | Payer: BLUE CROSS/BLUE SHIELD | Attending: Dermatology | Admitting: Dermatology

## 2016-07-04 NOTE — Telephone Encounter (Signed)
Last office visit 03/13/16 with D. Carlean Purl.  Last refilled 06/05/16 for #21 with no refills by [employer], PA-C. Ok to refill?

## 2016-07-04 NOTE — ED Notes (Signed)
Pt asked nurse first how much longer told pt that she still had 13 people in front of her, pt started cussing and staying we are a piece of shit and that if she has a heart attack she wants to have one a home and die. Pt told husband to take her home, pt left

## 2016-07-05 NOTE — Telephone Encounter (Signed)
Why does she need a refill? Usually only 7 days treatment unless she has a new case of shingles/new lesions since the last treatment was started.   If so then send in; if not then deny rx.  Thanks.

## 2016-07-07 ENCOUNTER — Telehealth: Payer: Self-pay | Admitting: *Deleted

## 2016-07-07 NOTE — Telephone Encounter (Signed)
I don't know how to treat this over the phone w/o looking at it.  She needs eval here.  Thanks. I wouldn't continue valtrex at this point.

## 2016-07-07 NOTE — Telephone Encounter (Signed)
See phone note

## 2016-07-07 NOTE — Telephone Encounter (Signed)
Please update on patient.  This doesn't sound typical for shingles be itself.

## 2016-07-07 NOTE — Telephone Encounter (Signed)
Left message on voicemail for patient to call back. 

## 2016-07-07 NOTE — Telephone Encounter (Signed)
Spoke to patient and was advised that she does not think that she has the shingles because it does not look like the shingles to her. Patient stated that she has the rash on her left leg, both arms and patch on left side.  Patient stated that she does not have insurance and does not know how much it will cost for her to come in. Patients stated that she takes Valtrex and she gets better and then two weeks later the rash comes back. Patient stated that she does not know what to do?

## 2016-07-07 NOTE — Telephone Encounter (Signed)
See refill phone note. Patient stated that she does not think that she has the shingles either, but does have a rash and wants to know what it is.  Patient stated that she was having chest pain Thursday night EMS came out and told her that she was okay.

## 2016-07-07 NOTE — Telephone Encounter (Signed)
[facility] Patient Name: Julia [name] Gender: Female DOB: 07-14-1965 Age: 51 Y 11 M 27 D Return Phone Number: [phone] (Primary) City/State/Zip: [place] Client [place] Night - Client Client Site [place] Physician Renford Dills - MD Who Is Calling Patient / Member / Family / Caregiver Call Type Triage / Clinical Relationship To Patient Self Return Phone Number [phone] (Primary) Chief Complaint Rash - Widespread Reason for Call Symptomatic / Request for [place] said she had a rash all over, hospital said it was shingles , paramedics said it was not because she had a fever. Nurse Assessment Nurse: Donnetta Hail, RN, [place] Date/Time Eilene Ghazi Time): 07/06/2016 6:03:22 PM Confirm and document reason for call. If symptomatic, describe symptoms. ---Caller said she had a rash all over her body. Cluster of blisters that itch and feels like they are warm to touch and painful, to back of left leg, both arms, and front of right leg. Caller states that the hospital said it was shingles about a month ago, states that she has a break out every 2 weeks. Went to the hospital for Chest pain today but left because of the wait. Denies difficulty breathing, No chest pain. [name] has difficulty walking because of the pain, which causes her to be short of breath. No other symptoms. Does the PT have any chronic conditions? (i.e. diabetes, asthma, etc.) ---Yes List chronic conditions. ---High Cholesterol Is the patient pregnant or possibly pregnant? (Ask all females between the ages of 82-55) ---No Guidelines Guideline Title Affirmed Question Breathing Difficulty [1] MILD difficulty breathing (e.g., minimal/no SOB at rest, SOB with walking, pulse <100) AND [2] NEW-onset or WORSE than normal Rash or  Redness - Widespread Rash looks like large or small blisters (i.e., fluid filled bubbles or sacs on the skin) Disp. Time Eilene Ghazi Time) Disposition Final User 07/06/2016 6:31:07 PM Go to ED Now (or PCP triage) Yes Donnetta Hail, RN, [place] Referrals [facility] - [facility] [place] UNDECIDED Care Advice Given Per Guideline PLEASE NOTE: All timestamps contained within this report are represented as [place] Standard Time. CONFIDENTIALTY NOTICE: This fax transmission is intended only for the addressee. It contains information that is legally privileged, confidential or otherwise protected from use or disclosure. If you are not the intended recipient, you are strictly prohibited from reviewing, disclosing, copying using or disseminating any of this information or taking any action in reliance on or regarding this information. If you have received this fax in error, please notify us immediately by telephone so that we can arrange for its return to [place]. Phone: [phone], Toll-Free: [phone], Fax: [phone] Page: 2 of 2 Call Id: [id number] Care Advice Given Per Guideline SEE PHYSICIAN WITHIN 4 HOURS (or PCP triage): * IF OFFICE WILL BE CLOSED AND NO PCP TRIAGE: You need to be seen within the next 3 or 4 hours. A nearby Urgent Care Center is often a good source of care. Another choice is to go to the ER. Go sooner if you become worse. CALL BACK IF: * You become worse. CARE ADVICE given per Breathing Difficulty (Adult) guideline. GO TO ED NOW (OR PCP TRIAGE): * IF NO PCP TRIAGE: You need to be seen. Go to the [facility] at _[facility] within the next hour. Leave as soon as you can. BRING MEDICINES: * Please bring a list of your current medicines when you  go to see the doctor. * It is also a good idea to bring the pill bottles too. This will help the doctor to make certain you are taking the right medicines and the right dose. CARE ADVICE given per Rash - Widespread and Cause Unknown (Adult)  guideline. Comments User: Royann Shivers, RN Date/Time Eilene Ghazi Time): 07/06/2016 6:21:36 PM RN confirms with caller, she is able to walk. User: Royann Shivers, RN Date/Time Eilene Ghazi Time): 07/06/2016 6:31:04 PM Rn confirms with caller, no new onset of weakness, states that due to pain it is more difficult to walk. Caller confirms that she can walk to car. RN advises caller to call 911 if unable to get to care safely, caller verbalizes understanding. User: Royann Shivers, RN Date/Time Eilene Ghazi Time): 07/06/2016 6:31:40 PM Caller states that earlier she called ambulance out to her house and said EKG was normal. User: Royann Shivers, RN Date/Time (Eastern Time): 07/06/2016 6:31:53 PM caller states that rash started Weds. User: Royann Shivers, RN Date/Time Eilene Ghazi Time): 07/06/2016 6:36:30 PM RN spoke with caller, notified of MD agrees with outcome, RN again notified of outcome, caller verbalizes understanding, states she is undecided if she will go. Paging DoctorName Phone DateTime Action Result/Outcome Notes Lamar Blinks- MD [phone] 07/06/2016 6:33:49 PM Doctor Paged Called On Call Provider - Lynann Bologna- MD 07/06/2016 6:34:00 PM Message Result Spoke with On Call - General RN spoke with MD. Report given. MD agrees with outcome.

## 2016-07-08 NOTE — Telephone Encounter (Signed)
Pt returned Regina's call. She understood she needs to be evaluated in the office and she is scheduled for 4:30 pm tomorrow.

## 2016-07-09 ENCOUNTER — Ambulatory Visit (INDEPENDENT_AMBULATORY_CARE_PROVIDER_SITE_OTHER): Payer: Self-pay | Admitting: Family Medicine

## 2016-07-09 ENCOUNTER — Encounter (INDEPENDENT_AMBULATORY_CARE_PROVIDER_SITE_OTHER): Payer: Self-pay

## 2016-07-09 ENCOUNTER — Encounter: Payer: Self-pay | Admitting: Family Medicine

## 2016-07-09 DIAGNOSIS — R21 Rash and other nonspecific skin eruption: Secondary | ICD-10-CM

## 2016-07-09 MED ORDER — PERMETHRIN 5 % EX CREA: 1.0000 "application " | TOPICAL_CREAM | Freq: Once | CUTANEOUS | 0 refills | Status: DC

## 2016-07-09 NOTE — Progress Notes (Signed)
Pre visit review using our clinic review tool, if applicable. No additional management support is needed unless otherwise documented below in the visit note. 

## 2016-07-09 NOTE — Assessment & Plan Note (Signed)
No reason to suspect shingles. Typical for scabies. Discussed with patient. See after visit summary. Reasonable for household contacts to get treated. Discussed about washing linens, etc. Discussed routine med use. Update me as needed. Can take Claritin for itching.

## 2016-07-09 NOTE — Progress Notes (Signed)
Rash.  Started a few weeks .  Itches.  Started on L lower back.  That was healing then had rash on L calf and R leg. Then B forearms.  She is still itching all over, worse at night.  Prev with lesions between R 4th and 5th fingers.  No one else with similar sx.  Itches more than hurts, with a burning and irritated sedation.    No FCNAVD.  She thought she was getting better initially with valtrex.  She would occ see some blistering on the lesions.    Rubbing alcohol helps with itching, otc cortisone doesn't help.   This is the worst itching she's ever had in her life.  Meds, vitals, and allergies reviewed.   ROS: Per HPI unless specifically indicated in ROS section   nad ncat rrr ctab Skin with irregular distribution of maculopapular lesions, some of which are arranged in linear fashion. Nondermatomal. No ulceration. Lesions are scattered on the left lower back, bilateral legs, forearms, and the right hand.

## 2016-07-09 NOTE — Patient Instructions (Addendum)
Likely scabies.  Massage permethrin cream thoroughly into the skin from the neck to the soles of the feet, including areas under the fingernails and toenails. Permethrin should be removed by washing (shower or bath) after 8 to 14 hours. Treatment is often performed overnight.    When you start treatment, wash all the clothes you and others in your home wore in the last 4 to 5 days in hot water. Then dry them in a dryer on high heat. You should also wash any bedclothes (sheets and blankets) or towels people in your home have touched. If your child is getting treated, wash his or her stuffed animals, too. Dry-cleaning will also get rid of the scabies mite. Any bedding, clothing, or towels that you cannot wash or dry-clean should be placed in a sealed plastic bag for at least 3 days. Scabies mites usually die without contact with human skin after a few days.  Itching can last for several weeks, even after the scabies mite is gone.  Take claritin if needed for itching, 10mg  a day.   Take care.  Glad to see you.

## 2016-07-10 ENCOUNTER — Ambulatory Visit: Payer: BLUE CROSS/BLUE SHIELD | Admitting: Family Medicine

## 2016-07-11 ENCOUNTER — Telehealth: Payer: Self-pay

## 2016-07-11 NOTE — Telephone Encounter (Signed)
Pt left v/m requesting shingles vaccine order sent to [facility].pt last acute visit on 07/09/16 for rash; 06/05/16 seen ED for herpes zoster. Pt request cb.

## 2016-07-13 MED ORDER — ZOSTER VACCINE LIVE 19400 UNT/0.65ML ~~LOC~~ SUSR: 0.6500 mL | Freq: Once | SUBCUTANEOUS | 0 refills | Status: AC

## 2016-07-13 NOTE — Telephone Encounter (Signed)
Sent. Thanks.   

## 2016-07-14 ENCOUNTER — Other Ambulatory Visit: Payer: Self-pay | Admitting: *Deleted

## 2016-07-14 ENCOUNTER — Other Ambulatory Visit: Payer: Self-pay | Admitting: Family Medicine

## 2016-07-14 NOTE — Telephone Encounter (Signed)
Left message on voicemail for patient to call back. 

## 2016-07-14 NOTE — Telephone Encounter (Signed)
Patient called back and was advised that script was sent to the pharmacy per Dr. Damita Dunnings.

## 2016-07-15 MED ORDER — PERMETHRIN 5 % EX CREA: 1.0000 "application " | TOPICAL_CREAM | Freq: Once | CUTANEOUS | 0 refills | Status: AC

## 2016-07-15 NOTE — Telephone Encounter (Addendum)
I reprinted the permethrin.  She can check with another pharmacy.  It should be cheaper at [facility].   It shouldn't cost that much.  And Dr. Darnell Level was able to get her a coupon, printed.   Thanks.

## 2016-07-15 NOTE — Telephone Encounter (Signed)
Left message on voicemail for patient to call back. 

## 2016-07-15 NOTE — Telephone Encounter (Signed)
No need to continue valtrex.  How is patient doing?  Thanks.

## 2016-07-15 NOTE — Telephone Encounter (Signed)
Spoke with patient and advised that another another Rx had been written along with the coupon for pickup.  Patient advised that [facility] should be cheaper.  Patient states that it will likely be tomorrow before she could come by to get the Rx but if she is able to come today, she will call before 5 pm to get instructions from [place].  Otherwise, Rx and coupon left at front desk for pickup.

## 2016-07-15 NOTE — Telephone Encounter (Signed)
Patient notified as instructed by telephone and verbalized understanding. Patient stated that she took the shingles shot yesterday and it made her arm really sore and it cost $200. Patient stated that she has to go back and get a second one later. Patient stated that she does not have the money to buy the lotion that you recommended for her rash because it is $200. Marland Kitchen Patient stated that she now has the rash on her neck. Patient stated that every time she goes back to work she breaks out in a rash.  Patient wants to know if there is something less expensive that you can prescribe for the rash?

## 2016-07-15 NOTE — Telephone Encounter (Signed)
Received refill request electronically Last office visit 07/09/16 Ibuprofen last refill 05/24/16 #30/2 See other refill request for Valtrex Have left message for patient to call back.

## 2016-07-16 NOTE — Telephone Encounter (Signed)
Sent ibuprofen.  Denied Valtrex. Thanks.

## 2016-08-07 ENCOUNTER — Ambulatory Visit (INDEPENDENT_AMBULATORY_CARE_PROVIDER_SITE_OTHER): Payer: BLUE CROSS/BLUE SHIELD | Admitting: Family Medicine

## 2016-08-07 ENCOUNTER — Encounter: Payer: Self-pay | Admitting: Family Medicine

## 2016-08-07 DIAGNOSIS — G8929 Other chronic pain: Secondary | ICD-10-CM | POA: Diagnosis not present

## 2016-08-07 DIAGNOSIS — B029 Zoster without complications: Secondary | ICD-10-CM | POA: Diagnosis not present

## 2016-08-07 DIAGNOSIS — M79673 Pain in unspecified foot: Secondary | ICD-10-CM

## 2016-08-07 DIAGNOSIS — R05 Cough: Secondary | ICD-10-CM | POA: Diagnosis not present

## 2016-08-07 DIAGNOSIS — R059 Cough, unspecified: Secondary | ICD-10-CM | POA: Insufficient documentation

## 2016-08-07 MED ORDER — GABAPENTIN 100 MG PO CAPS: ORAL_CAPSULE | ORAL | Status: DC

## 2016-08-07 MED ORDER — ALBUTEROL SULFATE HFA 108 (90 BASE) MCG/ACT IN AERS: 1.0000 | INHALATION_SPRAY | Freq: Four times a day (QID) | RESPIRATORY_TRACT | 2 refills | Status: DC | PRN

## 2016-08-07 MED ORDER — AMOXICILLIN-POT CLAVULANATE 875-125 MG PO TABS: 1.0000 | ORAL_TABLET | Freq: Two times a day (BID) | ORAL | 0 refills | Status: DC

## 2016-08-07 MED ORDER — DIPHENHYDRAMINE HCL 25 MG PO CAPS: 25.0000 mg | ORAL_CAPSULE | Freq: Three times a day (TID) | ORAL | Status: DC | PRN

## 2016-08-07 NOTE — Assessment & Plan Note (Signed)
See AVS, with post VZV sx.  Okay to inc gabapentin gradually and update me as needed.

## 2016-08-07 NOTE — Patient Instructions (Addendum)
Gradually increase your dose of gabapentin up to 300mg  at a time, taking it up to 3 times a day if needed.  Update me at that point.   When you get the 2nd dose of the shingles shot, then update me about both doses.   Keep taking benadryl as needed, use the inhaler for the cough and start augmenting.  Update [place] as needed.  Take care.  Glad to see you.

## 2016-08-07 NOTE — Assessment & Plan Note (Signed)
Likely combination of seasonal allergies, smoking, sinusitis.  Nontoxic.  Start augmentin, albuterol.  Continue prn benadryl.  Update me as needed.  She agrees.

## 2016-08-07 NOTE — Progress Notes (Signed)
h/o VZV on the R thigh back in 06/2016, still with some leftover pain.  Taking gabapentin w/o any relief, only taking total of 400mg  a day. See AVS re: pain, d/w pt about pain control options.    She had the first dose of shingrix recently.    Cough.  Going on for about 1-2 months.  Smoking, not yet ready to quit.  No fevers.  She thought it was due to allergies.  Taking benadryl and that helps with sinus pain.  Some rhinorrhea, green.  Some sputum, green.  Some wheeze.  Doesn't have an inhaler to use.  No vomiting, did have some loose stools.  Some sinus pain currently.  No ear pain.  Some ST.  Tongue looks yellow and is burning.  No other yellow/jaundice changes.  Smoking 1 PPD.  Prev 2 PPD.    Meds, vitals, and allergies reviewed.   ROS: Per HPI unless specifically indicated in ROS section   GEN: nad, alert and oriented HEENT: mucous membranes moist, tm w/o erythema, nasal exam w/o erythema, clear discharge noted,  OP with cobblestoning, dorsum of tongue with yellow streaks but not complete jaundice, bottom of tongue isn't yellow- this looks like nicotine staining not jaundice.  Sinuses ttp x 4 NECK: supple w/o LA CV: rrr.   PULM: ctab, no inc wob, no wheeze.   EXT: no edema Altered sensation in dermatome on the R thigh

## 2016-08-07 NOTE — Progress Notes (Signed)
Pre visit review using our clinic review tool, if applicable. No additional management support is needed unless otherwise documented below in the visit note. 

## 2016-08-20 ENCOUNTER — Telehealth: Payer: Self-pay | Admitting: *Deleted

## 2016-08-20 ENCOUNTER — Other Ambulatory Visit: Payer: Self-pay | Admitting: Family Medicine

## 2016-08-20 NOTE — Telephone Encounter (Signed)
Patient left a voicemail stating that she was given cream for scabies,. Patient stated that she has a skin irritation and needs another ointment more for itching and something that is stronger.

## 2016-08-20 NOTE — Telephone Encounter (Signed)
I do not know what to do for her without rechecking her in the office. Needs office visit.

## 2016-08-20 NOTE — Telephone Encounter (Signed)
Last office visit 08/07/2016.  Last refilled 07/16/2016 for #30 with 2 refills.  Ok to refill?

## 2016-08-20 NOTE — Telephone Encounter (Signed)
Patient notified as instructed by telephone and verbalized understanding. Appointment scheduled per Raquel Sarna.

## 2016-08-20 NOTE — Telephone Encounter (Signed)
Sent, changed to #90.  Thanks.

## 2016-08-22 ENCOUNTER — Ambulatory Visit: Payer: BLUE CROSS/BLUE SHIELD | Admitting: Family Medicine

## 2016-08-22 NOTE — Telephone Encounter (Signed)
Patient called to cancel her appointment for today.  Patient said she was suppose to be off, but they called her in to work.  Patient said she'll call back when she knows her work schedule.

## 2016-08-22 NOTE — Telephone Encounter (Signed)
Noted. Thanks.

## 2016-08-29 ENCOUNTER — Other Ambulatory Visit: Payer: Self-pay | Admitting: Family Medicine

## 2016-08-29 DIAGNOSIS — M79673 Pain in unspecified foot: Principal | ICD-10-CM

## 2016-08-29 DIAGNOSIS — G8929 Other chronic pain: Secondary | ICD-10-CM

## 2016-09-29 NOTE — Telephone Encounter (Addendum)
Pt left v/m requesting cb and wanting to know what scabies are. Pt has done some research and knows there is an oral pill that should help. The cream pt was given does not help; pt continues to break out in hives. I called pt back and left v/m for pt to cb. Per this note pt was to make appt to see Dr Damita Dunnings.

## 2016-09-30 NOTE — Telephone Encounter (Signed)
Pt scheduled appt on 10/01/16 at 5:30 to see Dr Damita Dunnings for scabes. FYI to Dr Damita Dunnings.

## 2016-10-01 ENCOUNTER — Encounter: Payer: Self-pay | Admitting: Family Medicine

## 2016-10-01 ENCOUNTER — Ambulatory Visit (INDEPENDENT_AMBULATORY_CARE_PROVIDER_SITE_OTHER): Payer: BLUE CROSS/BLUE SHIELD | Admitting: Family Medicine

## 2016-10-01 DIAGNOSIS — R21 Rash and other nonspecific skin eruption: Secondary | ICD-10-CM | POA: Diagnosis not present

## 2016-10-01 MED ORDER — PREDNISONE 20 MG PO TABS: ORAL_TABLET | ORAL | 0 refills | Status: DC

## 2016-10-01 NOTE — Patient Instructions (Signed)
Start prednisone, with food.  Stop ibuprofen for now.  If not better soon, then call and we'll set you up with the skin clinic.  Take care.  Glad to see you.

## 2016-10-01 NOTE — Telephone Encounter (Signed)
Noted, will d/w pt at OV. 

## 2016-10-01 NOTE — Progress Notes (Signed)
Still with some pain from prior VZV on posterior R leg.  On higher dose of gabapentin with some help but still with some pain.  More pain when she gets hot.  No ADE on gabapentin.  D/w pt about trial of higher dose of gabapentin.   Rash.  Started months ago.  Prev treated for scabies but it never got any better.  She has been itching for months.  No FCNAVD.  They itch most of the time.  The lesions don't crust over.  No blistering.  Never drain any pus.  Not painful. Old ones will heal but then new lesions will come up.    Meds, vitals, and allergies reviewed.   ROS: Per HPI unless specifically indicated in ROS section   nad ncat OP with MMM No facial lesions Neck supple, no LA rrr ctab Diffuse lesions noted on the arms, much less so on the legs and trunk. Lesions are small, less than 1 cm, excoriated round maculopapular lesions in various stages of healing. None are blistered, none are pustular. They are not dermatomal. They're not linear. They are not in between the fingers. No lesions on the palms.

## 2016-10-02 NOTE — Assessment & Plan Note (Signed)
She was previously treated for scabies, but had no improvement. No one else at home has a similar rash or scabies. Discussed with patient about options. Unclear diagnosis. She is itching diffusely. None of the lesions look infected. Reasonable for trial of prednisone to see if that helps with itching and also to see if that helps her heal up. If she is not improved we may have to get her over to dermatology. She will update me in a few days. Okay for outpatient follow-up. She agrees with plan.

## 2016-10-10 ENCOUNTER — Telehealth: Payer: Self-pay

## 2016-10-10 ENCOUNTER — Other Ambulatory Visit: Payer: Self-pay | Admitting: Family Medicine

## 2016-10-10 MED ORDER — HYDROXYZINE HCL 10 MG PO TABS: 10.0000 mg | ORAL_TABLET | Freq: Three times a day (TID) | ORAL | 0 refills | Status: DC | PRN

## 2016-10-10 NOTE — Telephone Encounter (Signed)
See other phone note.   What is her status with the itching and rash? If continued, then we need to set her up with derm.   She shouldn't take ibuprofen with prednisone.  Thanks.

## 2016-10-10 NOTE — Telephone Encounter (Signed)
Patient advised.

## 2016-10-10 NOTE — Telephone Encounter (Signed)
I don't recall much of the visit being devoted to anxiety.  She can try atarax prn but if not better with that then we need to see her back in clinic.   Sent.   If the itching/rash isn't better then let me know.  Thanks.

## 2016-10-10 NOTE — Telephone Encounter (Signed)
Pt left v/m; pt seen 10/01/16 and pt discussed with Dr Damita Dunnings about pt having problems with nerves; nerves are bad now. Pt request med [facility] to [facility] [place]. Pt request cb. Pt cannot afford to come in for another appt.

## 2016-10-10 NOTE — Telephone Encounter (Signed)
Electronic refill request. Prednisone Last office visit:   10/01/16 Last Filled:    14 tablet 0 10/01/2016  Please advise.

## 2016-10-24 ENCOUNTER — Other Ambulatory Visit: Payer: Self-pay | Admitting: *Deleted

## 2016-10-24 NOTE — Telephone Encounter (Signed)
Faxed refill request. Gabapentin Last office visit:   10/01/16 Last Filled:   ? Please advise.

## 2016-10-26 MED ORDER — GABAPENTIN 100 MG PO CAPS: 400.0000 mg | ORAL_CAPSULE | Freq: Every evening | ORAL | 3 refills | Status: DC | PRN

## 2016-10-26 NOTE — Telephone Encounter (Signed)
Sent. Thanks.   

## 2016-10-27 MED ORDER — HYDROXYZINE HCL 10 MG PO TABS: 10.0000 mg | ORAL_TABLET | Freq: Three times a day (TID) | ORAL | 0 refills | Status: DC | PRN

## 2016-10-27 NOTE — Telephone Encounter (Signed)
Pt said her pharmacy never received the rx for hydroxyzine. Upon further review, the rx was sent to [facility] because that is what was in the note. Pt said she has not used them in 2 years. [facility]

## 2016-10-27 NOTE — Addendum Note (Signed)
Addended by: Pilar Grammes on: 10/27/2016 03:40 PM   Modules accepted: Orders

## 2016-12-22 ENCOUNTER — Other Ambulatory Visit: Payer: Self-pay | Admitting: Family Medicine

## 2016-12-22 NOTE — Telephone Encounter (Signed)
Electronic refill request.  Hydroxyzine Last office visit:   10/01/16 Last Filled:    30 tablet 0 10/27/2016  Please advise.

## 2016-12-23 NOTE — Telephone Encounter (Signed)
Sent. Thanks.   

## 2017-01-30 ENCOUNTER — Other Ambulatory Visit: Payer: Self-pay | Admitting: Family Medicine

## 2017-01-30 NOTE — Telephone Encounter (Signed)
Electronic refill request. Ibuprofen Last office visit:   10/01/16 Last Filled:    90 tablet 1 10/10/2016  Please advise.

## 2017-01-31 NOTE — Telephone Encounter (Signed)
Sent. Thanks.   

## 2017-02-02 ENCOUNTER — Other Ambulatory Visit: Payer: Self-pay

## 2017-02-02 NOTE — Telephone Encounter (Signed)
Received fax for medication refill on Ibuprofen 600mg  #90.  R/x already sent in and approved for # 90 to [facility] in [place] on 01/31/17.

## 2017-03-11 ENCOUNTER — Other Ambulatory Visit: Payer: Self-pay | Admitting: Family Medicine

## 2017-03-11 NOTE — Telephone Encounter (Signed)
Sent but verify with pharmacy- she may have had a refill left.  Thanks.

## 2017-03-11 NOTE — Telephone Encounter (Signed)
Spoke to pharmacist and was advised that she picked the last refill up on 02/27/17. Pharmacist stated that the one you sent over today she was putting on hold for the next time the patient needs it refilled.

## 2017-03-11 NOTE — Telephone Encounter (Signed)
Last refill 01/31/17 #90/1 Last office visit 10/01/16

## 2017-04-08 ENCOUNTER — Ambulatory Visit: Payer: Self-pay | Admitting: *Deleted

## 2017-04-08 NOTE — Telephone Encounter (Signed)
Patient called and stated she had passed out at work yesterday. She did not hit her head or any body parts that she knows of. She denied feeling faint or lightheaded before passing out. She denied being dehydrated or hot. Did not go to the UC or ED yesterday.  Her b/p right now is  130/82 and her hr 85. She is calling now to make an appointment. She is not having any symptoms now. Appointment made for Monday. Pt advised that if she faints again or experience any usual symptoms to notify us and go to an UC. Pt voiced understanding.   Answer Assessment - Initial Assessment Questions 1. ONSET: "How long were you unconscious?" (minutes) "When did it happen?"     Does not know. Happened at work. 2. CONTENT: "What happened during period of unconsciousness?" (e.g., seizure activity)      Don't know. 3. MENTAL STATUS: "Alert and oriented now?" (oriented x 3 = name, month, location)      Alert and oriented right now. 4. TRIGGER: "What do you think caused the fainting?" "What were you doing just before you fainted?"  (e.g., exercise, sudden standing up, prolonged standing)     Does not know the reason for passing out. 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 5. RECURRENT SYMPTOM: "Have you ever passed out before?" If so, ask: "When was the last time?" and "What happened that time?"      no 6. INJURY: "Did you sustain any injury during the fall?"      no 7. CARDIAC SYMPTOMS: "Have you had any of the following symptoms: chest pain, difficulty breathing, palpitations?"     no 8. NEUROLOGIC SYMPTOMS: "Have you had any of the following symptoms: headache, numbness, vertigo, weakness?"     no 9. GI SYMPTOMS: "Have you had any of the following symptoms: abdominal pain, vomiting, diarrhea, blood in stools?"     no 10. OTHER SYMPTOMS: "Do you have any other symptoms?"       no 11. PREGNANCY: "Is there any chance you are pregnant?" "When was your last menstrual period?"        no  Protocols used: [facility]

## 2017-04-08 NOTE — Telephone Encounter (Signed)
Left message on voicemail for patient to call back. 

## 2017-04-08 NOTE — Telephone Encounter (Signed)
Pt advised to return to ED for CP, SOB, return of sx. Pt given appt for 04/13/17 at 1400.

## 2017-04-08 NOTE — Telephone Encounter (Signed)
30 min whenever possible.  Make sure she has routine ER cautions- CP, SOB, return of sx, etc.  Thanks.

## 2017-04-08 NOTE — Telephone Encounter (Signed)
Do you want pt seen before 04/13/17; there are some possible appts on 04/10/17?

## 2017-04-13 ENCOUNTER — Encounter: Payer: Self-pay | Admitting: Family Medicine

## 2017-04-13 ENCOUNTER — Ambulatory Visit: Payer: BLUE CROSS/BLUE SHIELD | Admitting: Family Medicine

## 2017-04-13 ENCOUNTER — Encounter (INDEPENDENT_AMBULATORY_CARE_PROVIDER_SITE_OTHER): Payer: Self-pay

## 2017-04-13 VITALS — BP 122/70 | HR 84 | Temp 98.6°F | Wt 162.5 lb

## 2017-04-13 DIAGNOSIS — R55 Syncope and collapse: Secondary | ICD-10-CM

## 2017-04-13 NOTE — Progress Notes (Signed)
Syncope.  No hx of syncope prior to this episode.   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  She didn't have any warning about the pending syncope.  Next thing she knew she was on the floor, with a person helping her up.  No tongue biting, no h/o SZ d/o.  Unclear duration of time with LOC, maximum time likely a few minutes.  No vomiting.  No urinary or stool incontinence.  She rested a few minutes then went home.  She was tired, slept the rest of the day.  She wasn't fasting.  No CP, SOB, BLE edema. No new meds.  She had dental work done a few weeks prior but not just prior to the event.    No known contributory changes from the day prior to the event.  No focal neuro changes o/w.  No sx in the meantime.  She feels well now.  She isn't lightheaded.    She had some R ear pain about 3-4 days prior to the event, but not on the day of the event.  No vertigo.  No heart racing.    Meds, vitals, and allergies reviewed.   ROS: Per HPI unless specifically indicated in ROS section   GEN: nad, alert and oriented HEENT: mucous membranes moist, TM wnl, nasal and OP exam wnl NECK: supple w/o LA CV: rrr.  no murmur PULM: ctab, no inc wob ABD: soft, +bs EXT: no edema CN 2-12 wnl B, S/S/DTR wnl x4

## 2017-04-13 NOTE — Patient Instructions (Signed)
1st episode of passing out without a clear cause.  EKG looks okay today, not different from prev in 2018.  Check labs today.  Go to the lab on the way out.  We'll contact you with your lab report. I'll check with the cardiology clinic in the meantime.  It may be the case that you need a wear a monitor for a few days.  Take care.  Glad to see you.

## 2017-04-14 DIAGNOSIS — R55 Syncope and collapse: Secondary | ICD-10-CM | POA: Insufficient documentation

## 2017-04-14 LAB — CBC WITH DIFFERENTIAL/PLATELET
Basophils Absolute: 0.1 10*3/uL (ref 0.0–0.1)
Basophils Relative: 1.3 % (ref 0.0–3.0)
Eosinophils Absolute: 0.4 10*3/uL (ref 0.0–0.7)
Eosinophils Relative: 4.1 % (ref 0.0–5.0)
HCT: 40.4 % (ref 36.0–46.0)
Hemoglobin: 14.4 g/dL (ref 12.0–15.0)
Lymphocytes Relative: 34.5 % (ref 12.0–46.0)
Lymphs Abs: 3.3 10*3/uL (ref 0.7–4.0)
MCHC: 35.5 g/dL (ref 30.0–36.0)
MCV: 93.8 fl (ref 78.0–100.0)
Monocytes Absolute: 0.7 10*3/uL (ref 0.1–1.0)
Monocytes Relative: 7.6 % (ref 3.0–12.0)
Neutro Abs: 5 10*3/uL (ref 1.4–7.7)
Neutrophils Relative %: 52.5 % (ref 43.0–77.0)
Platelets: 234 10*3/uL (ref 150.0–400.0)
RBC: 4.31 Mil/uL (ref 3.87–5.11)
RDW: 12.9 % (ref 11.5–15.5)
WBC: 9.5 10*3/uL (ref 4.0–10.5)

## 2017-04-14 LAB — COMPREHENSIVE METABOLIC PANEL
ALT: 23 U/L (ref 0–35)
AST: 16 U/L (ref 0–37)
Albumin: 4.2 g/dL (ref 3.5–5.2)
Alkaline Phosphatase: 80 U/L (ref 39–117)
BUN: 13 mg/dL (ref 6–23)
CO2: 28 mEq/L (ref 19–32)
Calcium: 9.5 mg/dL (ref 8.4–10.5)
Chloride: 106 mEq/L (ref 96–112)
Creatinine, Ser: 0.83 mg/dL (ref 0.40–1.20)
GFR: 76.5 mL/min (ref >60.00)
Glucose, Bld: 127 mg/dL — ABNORMAL HIGH (ref 70–99)
Potassium: 4.3 mEq/L (ref 3.5–5.1)
Sodium: 139 mEq/L (ref 135–145)
Total Bilirubin: 0.3 mg/dL (ref 0.2–1.2)
Total Protein: 6.6 g/dL (ref 6.0–8.3)

## 2017-04-14 LAB — TSH: TSH: 0.65 u[IU]/mL (ref 0.35–4.50)

## 2017-04-14 NOTE — Assessment & Plan Note (Signed)
EKG w/o acute changes, no sx prior to or since the event.  No chest pain, shortness of breath.  Not lightheaded.  EKG without acute changes.  Check routine labs today.  I will be in contact with cardiology about follow-up options.  Discussed with patient about differential diagnosis, EKG results, rationale for labs. >25 minutes spent in face to face time with patient, >50% spent in counselling or coordination of care.

## 2017-04-15 ENCOUNTER — Telehealth: Payer: Self-pay

## 2017-04-15 NOTE — Telephone Encounter (Signed)
Pt was seen 04/13/2017.Please advise.

## 2017-04-15 NOTE — Telephone Encounter (Signed)
Copied from [place]. Topic: Quick Communication - See Telephone Encounter >> Apr 15, 2017  2:28 PM Hewitt Shorts wrote: CRM for notification. See Telephone encounter for: pt is wanting to get something called in for her for ear pain  Best number [phone]   04/15/17.

## 2017-04-15 NOTE — Telephone Encounter (Signed)
I need more details about the ear pain.  Her TMs looked normal at the [facility].  Thanks.

## 2017-04-16 ENCOUNTER — Telehealth: Payer: Self-pay | Admitting: Cardiovascular Disease

## 2017-04-16 ENCOUNTER — Other Ambulatory Visit: Payer: Self-pay

## 2017-04-16 DIAGNOSIS — R55 Syncope and collapse: Secondary | ICD-10-CM

## 2017-04-16 NOTE — Telephone Encounter (Signed)
Julia Hampshire, MD  Julia Ghent, MD  Cc: Julia Shore, RN        It seems that the episode was sudden and not vasovagal. We should rule out arrhythmia. I recommend an echo and a 2 week outpatient monitor (Zio patch).   Julia [name],  Can you please arrange for these 2 tests then schedule an office consult with first available MD? Thanks.    S/w pt who is agreeable to 1/10, 8:30am echo, 9:30 zio monitor placement and new patient appt 2/5, 9am with Dr. [name]. Provided directions and phone number to the office. Pt verbalized understanding.

## 2017-04-16 NOTE — Telephone Encounter (Signed)
Patient says she had the ear pain when she was here for the visit and you said there seemed to be a little irritation but nothing concerning.  Patient says that since that time, the ear pain has worsened, especially with bending over and the pain runs down into her neck.  Patient reports no fever but a lot of sinus pressure and congestion.  Please advise.   Patient also reports that Cardiology contacted her this morning and she has an appointment tomorrow morning with them.

## 2017-04-16 NOTE — Telephone Encounter (Signed)
The TM itself looked okay with only minimal irritation on the canal.  Would try using OTC flonase 2 sprays per nostril per day and see if that helps.  I would avoid decongestants given her recent hx.  Thanks.

## 2017-04-16 NOTE — Telephone Encounter (Signed)
Patient advised.

## 2017-04-17 ENCOUNTER — Ambulatory Visit (INDEPENDENT_AMBULATORY_CARE_PROVIDER_SITE_OTHER): Payer: No Typology Code available for payment source

## 2017-04-17 ENCOUNTER — Other Ambulatory Visit: Payer: Self-pay

## 2017-04-17 DIAGNOSIS — R55 Syncope and collapse: Secondary | ICD-10-CM | POA: Diagnosis not present

## 2017-04-21 ENCOUNTER — Encounter: Payer: Self-pay | Admitting: *Deleted

## 2017-04-21 NOTE — Telephone Encounter (Signed)
Left voicemail message to call back for results. See results note.

## 2017-04-21 NOTE — Telephone Encounter (Signed)
Patient returning call to go over echo results.  Please call.

## 2017-04-21 NOTE — Telephone Encounter (Signed)
See results note. Reviewed results and recommendations with patient and she verbalized understanding with no further questions and requested work excuse letter for day that she had echocardiogram. Advised that I would get that in the mail for her and she was appreciative for the assistance. She had no further concerns at this time.

## 2017-05-08 ENCOUNTER — Ambulatory Visit: Payer: Self-pay | Admitting: *Deleted

## 2017-05-08 NOTE — Telephone Encounter (Signed)
Pt having some fatigue, feeling bad. Is able to drink fluids. Is around a lot of population. Thinks she may have the flu.  Appointment made for Saturday with Dr. Arlyn Dunning.   Reason for Disposition . [1] MODERATE weakness (i.e., interferes with work, school, normal activities) AND [2] persists > 3 days  Answer Assessment - Initial Assessment Questions 1. DESCRIPTION: "Describe how you are feeling."     Blah, tired worn out 2. SEVERITY: "How bad is it?"  "Can you stand and walk?"   - MILD - Feels weak or tired, but does not interfere with work, school or normal activities   - [place] to stand and walk; weakness interferes with work, school, or normal activities   - SEVERE - Unable to stand or walk     moderate 3. ONSET:  "When did the weakness begin?"     yesterday 4. CAUSE: "What do you think is causing the weakness?"     Not sure 5. MEDICINES: "Have you recently started a new medicine or had a change in the amount of a medicine?"     no 6. OTHER SYMPTOMS: "Do you have any other symptoms?" (e.g., chest pain, fever, cough, SOB, vomiting, diarrhea, bleeding)     Cough, non productive, achy 7. PREGNANCY: "Is there any chance you are pregnant?" "When was your last menstrual period?"     No, LMP at 34  Protocols used: WEAKNESS (GENERALIZED) AND FATIGUE-A-AH

## 2017-05-09 ENCOUNTER — Ambulatory Visit: Payer: No Typology Code available for payment source | Admitting: Family Medicine

## 2017-05-09 ENCOUNTER — Encounter: Payer: Self-pay | Admitting: Family Medicine

## 2017-05-09 VITALS — BP 128/82 | HR 78 | Temp 98.2°F | Wt 163.0 lb

## 2017-05-09 DIAGNOSIS — R52 Pain, unspecified: Secondary | ICD-10-CM

## 2017-05-09 DIAGNOSIS — R6889 Other general symptoms and signs: Secondary | ICD-10-CM

## 2017-05-09 LAB — POC INFLUENZA A&B (BINAX/QUICKVUE)
Influenza A, POC: NEGATIVE
Influenza B, POC: NEGATIVE

## 2017-05-09 MED ORDER — DOXYCYCLINE HYCLATE 100 MG PO TABS: 100.0000 mg | ORAL_TABLET | Freq: Two times a day (BID) | ORAL | 0 refills | Status: AC

## 2017-05-09 NOTE — Progress Notes (Signed)
Dr. Frederico Hamman T. Cassondra Stachowski, MD, [facility] [place], [address] Phone: [phone] Fax: [phone]  05/09/2017  Patient: Julia [name], MRN: [id number], DOB: [birth year], 52 y.o.  Primary Physician:  Tonia Ghent, MD   Chief Complaint  Patient presents with  . Fatigue    x 2 days.Marland KitchenMarland Kitchenpt has tried Rx ibuprofen with no relief...  . Generalized Body Aches  . Cough    nonproductive  . Nasal Congestion   Subjective:   Julia [name] is a 52 y.o. very pleasant female patient who presents with the following:  Missed work at [employer] on Thursday.. Never got her flu shot. When left work, felt work and slept and sweating all the time. Muscle aches and joint aches. Coughing some, too.  Globally she is not feeling all that well.  She does not think that she had a fever, she is not all that sure, she had a great deal of sweating.  She also had a lot of cough which is nonproductive, she is also has some nasal congestion.  Past Medical History, Surgical History, Social History, Family History, Problem List, Medications, and Allergies have been reviewed and updated if relevant.  Patient Active Problem List   Diagnosis Date Noted  . Syncope 04/14/2017  . Cough 08/07/2016  . Rash 07/09/2016  . 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 Advance care planning 12/22/2014  . Plantar fasciitis 10/12/2014  . Shingles 11/14/2012  . Routine general medical examination at a health care facility 05/07/2011  . SKIN LESION 06/22/2008  . TOBACCO ABUSE 01/27/2007  . HYPERLIPIDEMIA, MIXED 12/14/2006  . HYPERGLYCEMIA 06/05/2006    Past Medical History:  Diagnosis Date  . GERD (gastroesophageal reflux disease)   . Smoking     Past Surgical History:  Procedure Laterality Date  . CHOLECYSTECTOMY    . VULVECTOMY N/A 01/30/2015   Procedure: WIDE LOCAL EXCISION VULVA;  Surgeon: Everitt Amber, MD;  Location: [facility];   Service: Gynecology;  Laterality: N/A;    Social History   Socioeconomic History  . Marital status: Married    Spouse name: Not on file  . Number of children: 1  . Years of education: Not on file  . Highest education level: Not on file  Social Needs  . Financial resource strain: Not on file  . Food insecurity - worry: Not on file  . Food insecurity - inability: Not on file  . Transportation needs - medical: Not on file  . Transportation needs - non-medical: Not on file  Occupational History  . Occupation: Scientist, water quality at [employer]  . Smoking status: Current Every Day Smoker    Packs/day: 1.00    Years: 31.00    Pack years: 31.00    Types: Cigarettes  . Smokeless tobacco: Never Used  Substance and Sexual Activity  . Alcohol use: No    Alcohol/week: 0.0 oz  . Drug use: No  . Sexual activity: Not on file  Other Topics Concern  . Not on file  Social History Narrative   One son   Married 2000 (had been together since 1984)    Family History  Problem Relation Age of Onset  . Hypertension Mother   . Diabetes Mother        insulin dependent  . Heart disease Mother   . Diabetes Father   . Heart disease Father        CAD  .  Hypertension Brother   . Kidney disease Brother        kidney failure/resolved  . Colon cancer Maternal Aunt        dx'd at ~62  . Breast cancer Neg Hx     Allergies  Allergen Reactions  . Propoxyphene N-Acetaminophen Hives  . Nabumetone Rash    She can tolerate ibuprofen w/o difficulty  . Rofecoxib Rash    nightmares    Medication list reviewed and updated in full in [place].  ROS: GEN: Acute illness details above GI: Tolerating PO intake GU: maintaining adequate hydration and urination Pulm: No SOB Interactive and getting along well at home.  Otherwise, ROS is as per the HPI.  Objective:   BP 128/82   Pulse 78   Temp 98.2 F (36.8 C) (Oral)   Wt 163 lb (73.9 kg)   SpO2 97%   BMI 28.87 kg/m     Gen: WDWN, NAD; A & O x3, cooperative. Pleasant.Globally Non-toxic HEENT: Normocephalic and atraumatic. Throat clear, w/o exudate, R TM clear, L TM - good landmarks, No fluid present. rhinnorhea. No frontal or maxillary sinus T. MMM NECK: Anterior cervical  LAD is present CV: RRR, No M/G/R, cap refill <2 sec PULM: Breathing comfortably in no respiratory distress. no wheezing, crackles, rhonchi ABD: S,NT,ND,+BS. No HSM. No rebound. EXT: No c/c/e PSYCH: Friendly, good eye contact MSK: Nml gait    Laboratory and Imaging Data: Results for orders placed or performed in visit on 05/09/17  POC Influenza A&B(BINAX/QUICKVUE)  Result Value Ref Range   Influenza A, POC Negative Negative   Influenza B, POC Negative Negative     Assessment and Plan:   Flu-like symptoms  Body aches - Plan: POC Influenza A&B(BINAX/QUICKVUE)  Likely this is viral, and recommended continued supportive care, fluids, rest.  I gave her a paper prescription for some doxycycline to hold, she continues to worsen through early next week, think be reasonable to fill that.  Follow-up: No Follow-up on file.  Meds ordered this encounter  Medications  . doxycycline (VIBRA-TABS) 100 MG tablet    Sig: Take 1 tablet (100 mg total) by mouth 2 (two) times daily for 10 days.    Dispense:  20 tablet    Refill:  0   There are no discontinued medications. Orders Placed This Encounter  Procedures  . POC Influenza A&B(BINAX/QUICKVUE)    Signed,  Frederico Hamman T. [place] Graver, MD   Allergies as of 05/09/2017      Reactions   Propoxyphene N-acetaminophen Hives   Nabumetone Rash   She can tolerate ibuprofen w/o difficulty   Rofecoxib Rash   nightmares      Medication List        Accurate as of 05/09/17 12:49 PM. Always use your most recent med list.          albuterol 108 (90 Base) MCG/ACT inhaler Commonly known as:  PROVENTIL HFA;VENTOLIN HFA Inhale 1-2 puffs into the lungs every 6 (six) hours as needed for wheezing  or shortness of breath (or for cough).   atorvastatin 10 MG tablet Commonly known as:  LIPITOR TAKE 1 TABLET BY MOUTH EVERY DAY   diphenhydrAMINE 25 mg capsule Commonly known as:  BENADRYL Take 1 capsule (25 mg total) by mouth every 8 (eight) hours as needed for allergies.   doxycycline 100 MG tablet Commonly known as:  VIBRA-TABS Take 1 tablet (100 mg total) by mouth 2 (two) times daily for 10 days.   gabapentin 100 MG capsule  Commonly known as:  NEURONTIN Take 4-6 capsules (400-600 mg total) by mouth at bedtime as needed (for pain).   HYDROcodone-acetaminophen 5-325 MG tablet Commonly known as:  NORCO Take 1 tablet by mouth every 6 (six) hours as needed for severe pain.   hydrOXYzine 10 MG tablet Commonly known as:  ATARAX/VISTARIL TAKE 1 TO 2 TABLETS BY MOUTH 3 TIMES A DAY AS NEEDED FOR ANXIETY   ibuprofen 600 MG tablet Commonly known as:  ADVIL,MOTRIN TAKE 1 TABLET BY MOUTH EVERY 8 HOURS AS NEEDED WITH FOOD

## 2017-05-09 NOTE — Progress Notes (Signed)
Cardiology Office Note  Date:  05/12/2017   ID:  Julia [name], DOB 11-Feb-1966, MRN [id number]  PCP:  Tonia Ghent, MD   Chief Complaint  Patient presents with  . New Patient (Initial Visit)    syncope per dr Damita Dunnings. Patient c/o pain in lower left side of back. Patient denies chest pain and SOB. Meds reviewed verbally with patient.     HPI:  [name] is a 52 year old woman with past medical history of History of syncope Smoker 1 1/2 - 2 ppd Elevated glucose levels Hyperlipidemia Who presents by referral from Dr. 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  No warning  Next thing she knew she was on the floor, with a person helping her up.   No tongue biting, no h/o SZ d/o.   Unclear duration of time with LOC, maximum time likely a few minutes.   She rested a few minutes then went home. was tired, slept the rest of the day.   She wasn't fasting.   No CP, SOB, BLE edema. No new meds.    Normal EKG  Echocardiogram April 17, 2017 Normal ejection fraction, normal RV, no valve disease  Normal event monitor Minimal heart rate was 50 bpm with an average heart rate of 86 bpm. Rare PACs and PVCs.  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  PSH:     Past Surgical History:  Procedure Laterality Date  . CHOLECYSTECTOMY    . VULVECTOMY N/A 01/30/2015   Procedure: WIDE LOCAL EXCISION VULVA;  Surgeon: Everitt Amber, MD;  Location: [facility];  Service: Gynecology;  Laterality: N/A;    Current Outpatient Medications  Medication Sig Dispense Refill  . albuterol (PROVENTIL HFA;VENTOLIN HFA) 108 (90 Base) MCG/ACT inhaler Inhale 1-2 puffs into the lungs every 6 (six) hours as needed for wheezing or shortness of breath (or for cough). 1 Inhaler 2  . atorvastatin (LIPITOR) 10 MG tablet TAKE 1 TABLET BY MOUTH EVERY DAY 90 tablet 0  . diphenhydrAMINE (BENADRYL) 25 mg capsule Take 1 capsule (25 mg total) by mouth every 8 (eight) hours as needed for allergies.    Marland Kitchen doxycycline (VIBRA-TABS) 100 MG tablet Take 1 tablet (100 mg total) by mouth 2 (two) times daily for 10 days. 20 tablet 0  . gabapentin (NEURONTIN) 100 MG capsule Take 4-6 capsules (400-600 mg total) by mouth at bedtime as needed (for pain). 100 capsule 3  . HYDROcodone-acetaminophen (NORCO) 5-325 MG tablet Take 1 tablet by mouth every 6 (six) hours as needed for severe pain. 12 tablet 0  . hydrOXYzine (ATARAX/VISTARIL) 10 MG tablet TAKE 1 TO 2 TABLETS BY MOUTH 3 TIMES A DAY AS NEEDED FOR ANXIETY 30 tablet 2  . ibuprofen (ADVIL,MOTRIN) 600 MG tablet  TAKE 1 TABLET BY MOUTH EVERY 8 HOURS AS NEEDED WITH FOOD 90 tablet 1   No current facility-administered medications for this visit.      Allergies:   Propoxyphene n-acetaminophen; Nabumetone; and Rofecoxib   Social History:  The patient  reports that she has been smoking cigarettes.  She has a 46.50 pack-year smoking history. she has never used smokeless tobacco. She reports that she does not drink alcohol or use drugs.   Family History:   family history includes Colon cancer in her maternal aunt; Diabetes in her father and mother; Heart disease in her father and mother; Hypertension in her brother and mother; Kidney disease in her brother.     Review of Systems: Review of Systems  Constitutional: Negative.   Respiratory: Negative.   Cardiovascular: Negative.   Gastrointestinal: Negative.   Musculoskeletal: Negative.        Numbness in her hands, joint pain, fingers feel tight  Neurological: Positive for loss of consciousness.  Psychiatric/Behavioral: Negative.   All other systems reviewed and are negative.    PHYSICAL EXAM: VS:  BP 118/76 (BP Location: Left Arm, Patient Position: Sitting, Cuff Size: Normal)   Pulse 76   Ht 5\' 4"  (1.626 m)   Wt 163 lb 8 oz (74.2 kg)   BMI 28.06 kg/m  , BMI Body mass index is 28.06 kg/m. GEN: Well nourished, well developed, in no acute distress  HEENT: normal  Neck: no JVD, carotid bruits, or masses Cardiac: RRR; no murmurs, rubs, or gallops,no edema  Respiratory:  clear to auscultation bilaterally, normal work of breathing GI: soft, nontender, nondistended, + BS MS: no deformity or atrophy  Skin: warm and dry, no rash Neuro:  Strength and sensation are intact Psych: euthymic mood, full affect    Recent Labs: 04/13/2017: ALT 23; BUN 13; Creatinine, Ser 0.83; Hemoglobin 14.4; Platelets 234.0; Potassium 4.3; Sodium 139; TSH 0.65    Lipid Panel Lab Results  Component Value Date   CHOL 228 (H) 12/29/2014   HDL 29.80 (L) 12/29/2014   TRIG (H) 12/29/2014    557.0 Triglyceride is over 400; calculations on Lipids are invalid.      Wt Readings from Last 3 Encounters:  05/12/17 163 lb 8 oz (74.2 kg)  05/09/17 163 lb (73.9 kg)  04/13/17 162 lb 8 oz (73.7 kg)       ASSESSMENT AND PLAN:  Syncope, unspecified syncope type - Plan: EKG 12-Lead Drinks coffee all day Little other beverages Orthostatic on today's visit with no recovery after 3 minutes of standing, in fact blood pressure continued to drop down to low 161 systolic 20 point drop supine to standing Current workup negative for arrhythmia, structural heart disease Very active at baseline with no anginal symptoms,  will hold off on stress testing Certainly possible she had orthostatic event Recommended she drink more fluids in between her cups of coffee Recommended compression hose, even back brace as she has chronic back issues Also suggested she monitor blood pressure with standing and call our office if this continues to run very low If she has recurrent episodes of syncope additional testing could be done including a implantable reveal device.  Would recheck orthostatics for any additional syncope  TOBACCO ABUSE Long discussion concerning smoking cessation Discussed each modality, she will continue to try vapors, nicotine patches We have sent in prescription for Chantix 10 minutes spent discussing smoking cessation techniques  HYPERLIPIDEMIA, MIXED - Plan: EKG 12-Lead Consider starting low-dose cholesterol medication High risk of coronary disease  given family history and long smoking history  HYPERGLYCEMIA We have encouraged continued exercise, careful diet management in an effort to lose weight.   Disposition:   F/U as needed  Patient was seen in consultation for Dr. Damita Dunnings and will be referred back to his office for ongoing care of the issues detailed above   Total encounter time more than 60 minutes  Greater than 50% was spent in counseling and coordination of care with the patient    Orders Placed This Encounter  Procedures  . EKG 12-Lead     Signed, Esmond Plants, M.D., Ph.D. 05/12/2017  [place], [place]

## 2017-05-12 ENCOUNTER — Encounter: Payer: Self-pay | Admitting: Cardiovascular Disease

## 2017-05-12 ENCOUNTER — Ambulatory Visit (INDEPENDENT_AMBULATORY_CARE_PROVIDER_SITE_OTHER): Payer: No Typology Code available for payment source | Admitting: Cardiovascular Disease

## 2017-05-12 VITALS — BP 118/76 | HR 76 | Ht 64.0 in | Wt 163.5 lb

## 2017-05-12 DIAGNOSIS — F17200 Nicotine dependence, unspecified, uncomplicated: Secondary | ICD-10-CM | POA: Diagnosis not present

## 2017-05-12 DIAGNOSIS — E782 Mixed hyperlipidemia: Secondary | ICD-10-CM

## 2017-05-12 DIAGNOSIS — R55 Syncope and collapse: Secondary | ICD-10-CM | POA: Diagnosis not present

## 2017-05-12 DIAGNOSIS — R7309 Other abnormal glucose: Secondary | ICD-10-CM

## 2017-05-12 MED ORDER — VARENICLINE TARTRATE 1 MG PO TABS: 1.0000 mg | ORAL_TABLET | Freq: Two times a day (BID) | ORAL | 3 refills | Status: DC

## 2017-05-12 NOTE — Patient Instructions (Addendum)
Add a touch of salt,  Knee compression, back brace Fluids all day  Check pressures when standing  Medication Instructions:   No medication changes made  Labwork:  No new labs needed  Testing/Procedures:  No further testing at this time   Follow-Up: It was a pleasure seeing you in the office today. Please call us if you have new issues that need to be addressed before your next appt.  [phone]  Your physician wants you to follow-up in:  As needed  If you need a refill on your cardiac medications before your next appointment, please call your pharmacy.

## 2017-06-03 ENCOUNTER — Other Ambulatory Visit: Payer: Self-pay | Admitting: Family Medicine

## 2017-06-03 NOTE — Telephone Encounter (Signed)
Electronic refill request Last refill Hydroxyzine 12/23/16 #30/2 Ibuprofen 03/11/17 #90/1 Gabapentin  10/26/16 #100/3

## 2017-06-04 NOTE — Telephone Encounter (Signed)
Sent. Thanks.   

## 2017-07-09 ENCOUNTER — Encounter: Payer: Self-pay | Admitting: Primary Care

## 2017-07-09 ENCOUNTER — Ambulatory Visit: Payer: No Typology Code available for payment source | Admitting: Primary Care

## 2017-07-09 VITALS — BP 120/72 | HR 85 | Temp 98.2°F | Ht 64.0 in | Wt 160.5 lb

## 2017-07-09 DIAGNOSIS — J441 Chronic obstructive pulmonary disease with (acute) exacerbation: Secondary | ICD-10-CM

## 2017-07-09 MED ORDER — PREDNISONE 20 MG PO TABS: ORAL_TABLET | ORAL | 0 refills | Status: DC

## 2017-07-09 MED ORDER — AZITHROMYCIN 250 MG PO TABS: ORAL_TABLET | ORAL | 0 refills | Status: DC

## 2017-07-09 NOTE — Patient Instructions (Signed)
Start Azithromycin antibiotics for infection. Take 2 tablets by mouth today, then 1 tablet daily for 4 additional days.  Start Prednisone tablets for chest tightness, shortness of breath. Take 2 tablets once daily for 5 days.  Shortness of Breath/Wheezing/Cough: Use the albuterol inhaler. Inhale 2 puffs into the lungs every 4 to six hours as needed for wheezing, cough, and/or shortness of breath.   It was a pleasure meeting you!

## 2017-07-09 NOTE — Progress Notes (Signed)
Subjective:    Patient ID: Julia [name], female    DOB: 03/24/1966, 52 y.o.   MRN: [id number]  HPI  Julia [name] is a 52 year old female with a history of tobacco use (current user) who presents today with a chief complaint of cough.   She also reports fevers, nasal congestion, chest congestion, shortness of breath. Her first fever was last night which ran 100. She's taken Ibuprofen last night. Her cough is productive with green sputum. She works at [employer] and has been exposed to several residents who've had pneumonia.   Review of Systems  Constitutional: Positive for fatigue and fever.  HENT: Positive for congestion. Negative for sinus pressure.   Respiratory: Positive for cough, shortness of breath and wheezing.        Past Medical History:  Diagnosis Date  . GERD (gastroesophageal reflux disease)   . Smoking      Social History   Socioeconomic History  . Marital status: Married    Spouse name: Not on file  . Number of children: 1  . Years of education: Not on file  . Highest education level: Not on file  Occupational History  . Occupation: Scientist, water quality at [employer]  . Financial resource strain: Not on file  . Food insecurity:    Worry: Not on file    Inability: Not on file  . Transportation needs:    Medical: Not on file    Non-medical: Not on file  Tobacco Use  . Smoking status: Current Every Day Smoker    Packs/day: 1.50    Years: 31.00    Pack years: 46.50    Types: Cigarettes  . Smokeless tobacco: Never Used  Substance and Sexual Activity  . Alcohol use: No    Alcohol/week: 0.0 oz  . Drug use: No  . Sexual activity: Not on file  Lifestyle  . Physical activity:    Days per week: Not on file    Minutes per session: Not on file  . Stress: Not on file  Relationships  . Social connections:    Talks on phone: Not on file    Gets together: Not on file    Attends religious service: Not on file    Active member of club or  organization: Not on file    Attends meetings of clubs or organizations: Not on file    Relationship status: Not on file  . Intimate partner violence:    Fear of current or ex partner: Not on file    Emotionally abused: Not on file    Physically abused: Not on file    Forced sexual activity: Not on file  Other Topics Concern  . Not on file  Social History Narrative   One son   Married 2000 (had been together since 1984)    Past Surgical History:  Procedure Laterality Date  . CHOLECYSTECTOMY    . VULVECTOMY N/A 01/30/2015   Procedure: WIDE LOCAL EXCISION VULVA;  Surgeon: Everitt Amber, MD;  Location: [facility];  Service: Gynecology;  Laterality: N/A;    Family History  Problem Relation Age of Onset  . Hypertension Mother   . Diabetes Mother        insulin dependent  . Heart disease Mother   . Diabetes Father   . Heart disease Father        CAD  . Hypertension Brother   . Kidney disease Brother        kidney failure/resolved  .  Colon cancer Maternal Aunt        dx'd at ~62  . Breast cancer Neg Hx     Allergies  Allergen Reactions  . Propoxyphene N-Acetaminophen Hives  . Nabumetone Rash    She can tolerate ibuprofen w/o difficulty  . Rofecoxib Rash    nightmares    Current Outpatient Medications on File Prior to Visit  Medication Sig Dispense Refill  . albuterol (PROVENTIL HFA;VENTOLIN HFA) 108 (90 Base) MCG/ACT inhaler Inhale 1-2 puffs into the lungs every 6 (six) hours as needed for wheezing or shortness of breath (or for cough). 1 Inhaler 2  . atorvastatin (LIPITOR) 10 MG tablet TAKE 1 TABLET BY MOUTH EVERY DAY 90 tablet 0  . diphenhydrAMINE (BENADRYL) 25 mg capsule Take 1 capsule (25 mg total) by mouth every 8 (eight) hours as needed for allergies.    Marland Kitchen gabapentin (NEURONTIN) 100 MG capsule TAKE UP TO 3 TABLETS TWICE DAILY FOR PAIN 90 capsule 1  . HYDROcodone-acetaminophen (NORCO) 5-325 MG tablet Take 1 tablet by mouth every 6 (six) hours as needed for severe pain. 12  tablet 0  . hydrOXYzine (ATARAX/VISTARIL) 10 MG tablet TAKE 1 TO 2 TABLETS BY MOUTH THREE TIMES DAILY AS NEEDED FOR ANXIETY 30 tablet 1  . ibuprofen (ADVIL,MOTRIN) 600 MG tablet TAKE 1 TABLET BY MOUTH EVERY 8 HOURS AS NEEDED WITH FOOD 90 tablet 1  . varenicline (CHANTIX CONTINUING MONTH PAK) 1 MG tablet Take 1 tablet (1 mg total) by mouth 2 (two) times daily. (Patient not taking: Reported on 07/09/2017) 60 tablet 3   No current facility-administered medications on file prior to visit.     BP 120/72   Pulse 85   Temp 98.2 F (36.8 C) (Oral)   Ht 5\' 4"  (1.626 m)   Wt 160 lb 8 oz (72.8 kg)   SpO2 95%   BMI 27.55 kg/m    Objective:   Physical Exam  Constitutional: She appears well-nourished. She appears ill.  HENT:  Right Ear: Tympanic membrane and ear canal normal.  Left Ear: Tympanic membrane and ear canal normal.  Nose: Right sinus exhibits no maxillary sinus tenderness and no frontal sinus tenderness. Left sinus exhibits no maxillary sinus tenderness and no frontal sinus tenderness.  Mouth/Throat: Oropharynx is clear and moist.  Eyes: Conjunctivae are normal.  Neck: Neck supple.  Cardiovascular: Normal rate and regular rhythm.  Pulmonary/Chest: Effort normal. She has no decreased breath sounds. She has no wheezes. She has rhonchi in the right upper field and the left upper field. She has no rales.  Lymphadenopathy:    She has no cervical adenopathy.  Skin: Skin is warm and dry.          Assessment & Plan:  Acute COPD Exacerbation:  Cough, congestion, fevers x 3-4 days. Exposed to pneumonia via work. Does appear ill, rhonchi and tight airways on exam. Congested cough. Given shortness of breath with production of purulent sputum and fevers will treat. Rx for Zpak and prednisone course sent to pharmacy.  Discussed use of albuterol inhaler.  Fluids, rest, follow up PRN.  Julia Koch, NP

## 2017-07-17 ENCOUNTER — Ambulatory Visit: Payer: No Typology Code available for payment source | Admitting: Family Medicine

## 2017-07-17 ENCOUNTER — Encounter: Payer: Self-pay | Admitting: Family Medicine

## 2017-07-17 DIAGNOSIS — M5432 Sciatica, left side: Secondary | ICD-10-CM

## 2017-07-17 MED ORDER — PREDNISONE 20 MG PO TABS: ORAL_TABLET | ORAL | 0 refills | Status: DC

## 2017-07-17 MED ORDER — HYDROCODONE-ACETAMINOPHEN 5-325 MG PO TABS: 1.0000 | ORAL_TABLET | Freq: Four times a day (QID) | ORAL | 0 refills | Status: DC | PRN

## 2017-07-17 NOTE — Patient Instructions (Signed)
Take 2 a day for 5 days, then 1 a day for 5 days, with food. Don't take with aleve/ibuprofen. Use hydrocodone if needed.  Sedation caution.  Out of work in the meantime.  Update me next week if not better.  Take care.  Glad to see you.

## 2017-07-17 NOTE — Progress Notes (Signed)
Recently on abx and prednisone and some better but still with cough.  Smoking d/w pt.  She is cutting back.  She is down from 3 PPD to 1/2 PPD.  She is using a vape pen and cutting down on that, too.  Smoking cessation encouraged.  No other syncope.    Back pain.  She used a left over hydrocodone from shingles.  Used that with some relief.  She had shingrix s/p both doses.  1 dose last April, then 2nd dose in October.    Went to bed Tuesday with no pain, woke up with pain on Wed AM.  L lower back, down the L leg.  No R sided sx.  No B/B sx.  No numbness.  Can still walk but with gait affected by pain.  No trauma.  No assault, etc.    Meds, vitals, and allergies reviewed.   ROS: Per HPI unless specifically indicated in ROS section   nad ncat Uncomfortable, but able to bear weight. Neck supple no lymphadenopathy. rrr ctab abd soft, not ttp Midline back not tender.  No CVA pain.  Left lower back is tender.  No rash. L SLR pos Distally neurovascularly intact on gross examination.  Able to bear weight.

## 2017-07-18 DIAGNOSIS — M5432 Sciatica, left side: Secondary | ICD-10-CM | POA: Insufficient documentation

## 2017-07-18 NOTE — Assessment & Plan Note (Signed)
Start prednisone.  Routine cautions given. Take 2 a day for 5 days, then 1 a day for 5 days, with food. Don't take with aleve/ibuprofen. Use hydrocodone if needed.  Sedation caution.  Out of work in the meantime.  Update me next week if not better.  She agrees with plan.

## 2017-07-20 ENCOUNTER — Telehealth: Payer: Self-pay

## 2017-07-20 NOTE — Telephone Encounter (Signed)
I was unable to reach pt by phone.

## 2017-07-20 NOTE — Telephone Encounter (Signed)
Patient called back and wants the nurse to call her back [phone]

## 2017-07-20 NOTE — Telephone Encounter (Signed)
I spoke with pt; pt said lt lower back hurting and lt knee cap is swollen with extreme pain in knee cap; pain level is 10. Hydrocodone helps pain a little bit but after 1 - 2 hours of taking pain med the pain is back again. Pt was seen on 07/17/17 and cannot afford to come back in for an appt. Pt request muscle relaxer or another med sent [facility]. Pt request cb.

## 2017-07-20 NOTE — Telephone Encounter (Signed)
Patient advised and asks if she could get an appointment at our office after 1 pm on 07/21/17.  Appointment scheduled.

## 2017-07-20 NOTE — Telephone Encounter (Signed)
I need more detail.  If really she has 10/10 pain, then she needs ER eval since that is a life altering level of pain that would prevent her doing anything else.  I'm sympathetic but I wouldn't expect a muscle relaxer to help and I can't send anything stronger w/o eval.

## 2017-07-20 NOTE — Telephone Encounter (Signed)
PLEASE NOTE: All timestamps contained within this report are represented as [place] Standard Time. CONFIDENTIALTY NOTICE: This fax transmission is intended only for the addressee. It contains information that is legally privileged, confidential or otherwise protected from use or disclosure. If you are not the intended recipient, you are strictly prohibited from reviewing, disclosing, copying using or disseminating any of this information or taking any action in reliance on or regarding this information. If you have received this fax in error, please notify us immediately by telephone so that we can arrange for its return to [place]. Phone: [phone], Toll-Free: [phone], Fax: [phone] Page: 1 of 1 Call Id: [id number] [place] Patient Name: Julia [name] Gender: Female DOB: [birth year] Age: 52 Y 68 D Return Phone Number: [phone] (Primary) Address: City/State/Zip: [place] [address] Client [place] [facility] - Client Client Site [place] Physician Renford Dills - MD Contact Type Call Who Is Calling Patient / Member / Family / Caregiver Call Type Triage / Clinical Relationship To Patient Self Return Phone Number [phone] (Primary) Chief Complaint Leg Pain Reason for Call Symptomatic / Request for Health Information Initial Comment Caller has pain in her back and down her leg. Translation No Nurse Assessment Guidelines Guideline Title Affirmed Question Affirmed Notes Nurse Date/Time (Eastern Time) Disp. Time Eilene Ghazi Time) Disposition Final User 07/19/2017 6:53:23 AM Attempt made - line busy Yolanda Bonine. RN, Sunday Spillers 07/19/2017 6:58:14 AM FINAL ATTEMPT MADE - no message left Yes Yolanda Bonine. RN, Sunday Spillers Comments User: Townsend Roger. RN Date/Time Eilene Ghazi Time): 07/19/2017 6:58:01 AM unable to reach patient. Phone giving a busy signal

## 2017-07-21 ENCOUNTER — Encounter: Payer: Self-pay | Admitting: Family Medicine

## 2017-07-21 ENCOUNTER — Ambulatory Visit (INDEPENDENT_AMBULATORY_CARE_PROVIDER_SITE_OTHER)
Admission: RE | Admit: 2017-07-21 | Discharge: 2017-07-21 | Disposition: A | Discharge status: Home / Self Care | Payer: No Typology Code available for payment source | Source: Ambulatory Visit | Attending: Family Medicine | Admitting: Family Medicine

## 2017-07-21 ENCOUNTER — Ambulatory Visit: Payer: No Typology Code available for payment source | Admitting: Family Medicine

## 2017-07-21 VITALS — BP 126/74 | HR 83 | Temp 98.4°F | Wt 166.5 lb

## 2017-07-21 DIAGNOSIS — M5432 Sciatica, left side: Secondary | ICD-10-CM

## 2017-07-21 DIAGNOSIS — M79605 Pain in left leg: Secondary | ICD-10-CM

## 2017-07-21 DIAGNOSIS — M545 Low back pain, unspecified: Secondary | ICD-10-CM

## 2017-07-21 MED ORDER — GABAPENTIN 100 MG PO CAPS: ORAL_CAPSULE | ORAL | Status: DC

## 2017-07-21 NOTE — Patient Instructions (Signed)
Go up on the gabapentin up to 3 tabs 3 times a day.  If not better at that point and still with significant pain, then let me know.  Take care.  Glad to see you.

## 2017-07-21 NOTE — Progress Notes (Signed)
Still with sig pain on L side, lower back and L leg.  No FCNAVD.  No R sided sx.  No dysuria.  She can get settled in a chair "just right" and the pain is clearly better.  Laying flat in bed helps.  Walking is clear more painful.  No weakness.    Taking 400mg  gabapentin total per day now.    Meds, vitals, and allergies reviewed.   ROS: Per HPI unless specifically indicated in ROS section   GEN: nad, alert and oriented HEENT: mucous membranes moist NECK: supple rrr ctab Back not ttp in midline L SLR positive L lower back ttp.  No cva pain.  Distally nv intact.

## 2017-07-22 ENCOUNTER — Other Ambulatory Visit: Payer: Self-pay | Admitting: Family Medicine

## 2017-07-22 ENCOUNTER — Telehealth: Payer: Self-pay | Admitting: Family Medicine

## 2017-07-22 NOTE — Telephone Encounter (Signed)
Copied from [place] [id number]. Topic: Quick Communication - Rx Refill/Question >> Jul 22, 2017  4:59 PM Cleaster Corin, [place] wrote: Medication: HYDROcodone-acetaminophen [facility]) 5-325 MG tablet [[id number]]   [id number] [place], [place] - [address] [place] [address] Phone: [phone] Fax: [phone]

## 2017-07-22 NOTE — Assessment & Plan Note (Signed)
Plain films d/w pt at OV. 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 Images personally reviewed, I agree with radiology. In the meantime, would go up on the gabapentin up to 3 tabs 3 times a day.  If not better at that point and still with significant pain, then she'll let me know.  At that point she may need advanced imaging or referral to neurosurgery.  She still able to bear weight and does not have weakness today.  Still okay for outpatient follow-up.

## 2017-07-23 NOTE — Telephone Encounter (Signed)
Both sent but don't take ibuprofen while on prednisone.  Okay to restart after finishing the prednisone.

## 2017-07-23 NOTE — Telephone Encounter (Signed)
Electronic refill request.  Last office visit:   07/21/17 Last Filled:   Gabapentin 07/21/2017 Last Filled:   Ibuprofen  ? Please advise.

## 2017-07-23 NOTE — Telephone Encounter (Signed)
Patient called back to check the status of her medication refill. Please call patient if insurance has approved it.

## 2017-07-23 NOTE — Telephone Encounter (Signed)
See refill request.

## 2017-07-23 NOTE — Telephone Encounter (Signed)
Rx sent to pharmacy 07/17/17

## 2017-07-23 NOTE — Telephone Encounter (Signed)
Patient advised.

## 2017-07-27 ENCOUNTER — Telehealth: Payer: Self-pay

## 2017-07-27 ENCOUNTER — Telehealth: Payer: Self-pay | Admitting: Family Medicine

## 2017-07-27 MED ORDER — HYDROCODONE-ACETAMINOPHEN 5-325 MG PO TABS: 1.0000 | ORAL_TABLET | Freq: Four times a day (QID) | ORAL | 0 refills | Status: DC | PRN

## 2017-07-27 NOTE — Telephone Encounter (Signed)
Request for refill of Hydrocodone-acetaminophen (Norco)5-325mg . Pt states she did pick up the prescription on 4/12 and is currently out of the medication.  See telephone encounter from 07/22/17   Last refill 07/17/17 #20  Dr. Damita Dunnings

## 2017-07-27 NOTE — Telephone Encounter (Signed)
Patient advised.

## 2017-07-27 NOTE — Telephone Encounter (Signed)
Pt last seen 07/21/17. I spoke with pt and now pain level is 7. Pain is in lower back and radiates down lt leg. Pt presently taking 4 gabapentin daily. [facility].

## 2017-07-27 NOTE — Telephone Encounter (Signed)
rx sent for hydrocodone.   Would gradually inc gabapentin up to 300mg  TID prn for pain (9 tabs daily.  She is currently taking 4 tabs a day) See how that does and update me if not better.    Med list updated.   Thanks.

## 2017-07-27 NOTE — Telephone Encounter (Signed)
Copied from [place] [id number]. Topic: General - Other >> Jul 27, 2017  2:05 PM Aurelio Brash B wrote: Pts called to let Dr Damita Dunnings  know she still has back pain , I also put in med refill request

## 2017-07-27 NOTE — Telephone Encounter (Signed)
See next note

## 2017-07-27 NOTE — Telephone Encounter (Signed)
Copied from [place] [id number]. Topic: Quick Communication - Rx Refill/Question >> Jul 27, 2017  2:04 PM Aurelio Brash B wrote: Medication:HYDROcodone-acetaminophen [facility]) 5-325 MG tablet   Has the patient contacted their pharmacy? No refills  (Agent: If no, request that the patient contact the pharmacy for the refill.)  Preferred Pharmacy (with phone number or street name):   [id number] [place], [place] - [address] [phone] (Phone) [phone] (Fax)     Agent: Please be advised that RX refills may take up to 3 business days. We ask that you follow-up with your pharmacy.

## 2017-07-29 ENCOUNTER — Ambulatory Visit: Payer: Self-pay

## 2017-07-29 ENCOUNTER — Encounter: Payer: Self-pay | Admitting: *Deleted

## 2017-07-29 NOTE — Telephone Encounter (Signed)
This encounter was created in error - please disregard.

## 2017-07-29 NOTE — Telephone Encounter (Signed)
Patient called in with c/o "back pain." She says "this has been going on for 4 weeks. I just saw Dr. Damita Dunnings last week and the medicine I am on is not helping the pain. The only thing that helped it was a muscle relaxer that I took. It was prescribed to me about 2 years ago before I saw Dr. Damita Dunnings and I only had 1 in the bottle. My husband said I slept all night without turning over and when I woke up, my back wasn't hurting. I would like him to prescribe me a muscle relaxer to see if that will help." I asked about numbness to legs, she denies. I asked about bowel and bladder problems, she denies. According to protocol, see PCP within 2 weeks, appointment scheduled for Monday, 08/03/17 at 1530, care advice given, patient verbalized understanding.   Reason for Disposition . Back pain present > 2 weeks  Answer Assessment - Initial Assessment Questions 1. ONSET: "When did the pain begin?"      About 4 weeks  2. LOCATION: "Where does it hurt?" (upper, mid or lower back)     Lower 3. SEVERITY: "How bad is the pain?"  (e.g., Scale 1-10; mild, moderate, or severe)   - MILD (1-3): doesn't interfere with normal activities    - MODERATE (4-7): interferes with normal activities or awakens from sleep    - SEVERE (8-10): excruciating pain, unable to do any normal activities      8 4. PATTERN: "Is the pain constant?" (e.g., yes, no; constant, intermittent)      Yes 5. RADIATION: "Does the pain shoot into your legs or elsewhere?"     Down left leg 6. CAUSE:  "What do you think is causing the back pain?"      No 7. BACK OVERUSE:  "Any recent lifting of heavy objects, strenuous work or exercise?"     No 8. MEDICATIONS: "What have you taken so far for the pain?" (e.g., nothing, acetaminophen, NSAIDS)     Hydrocodone, gabapentin, ibuprofen 9. NEUROLOGIC SYMPTOMS: "Do you have any weakness, numbness, or problems with bowel/bladder control?"     No 10. OTHER SYMPTOMS: "Do you have any other symptoms?" (e.g.,  fever, abdominal pain, burning with urination, blood in urine)       No 11. PREGNANCY: "Is there any chance you are pregnant?" (e.g., yes, no; LMP)       No  Protocols used: BACK PAIN-A-AH

## 2017-07-30 ENCOUNTER — Other Ambulatory Visit: Payer: Self-pay | Admitting: Family Medicine

## 2017-07-30 MED ORDER — CYCLOBENZAPRINE HCL 10 MG PO TABS: 5.0000 mg | ORAL_TABLET | Freq: Three times a day (TID) | ORAL | 0 refills | Status: DC | PRN

## 2017-07-30 NOTE — Telephone Encounter (Signed)
Copied from [place]. Topic: Quick Communication - Rx Refill/Question >> Jul 30, 2017  9:40 AM Yvette Rack wrote: Medication: gabapentin (NEURONTIN) 100 MG capsule Has the patient contacted their pharmacy? Yes.   (Agent: If no, request that the patient contact the pharmacy for the refill.) Preferred Pharmacy (with phone number or street name): [id number] [place], [place] - [address] [phone] (Phone) [phone] (Fax)     Agent: Please be advised that RX refills may take up to 3 business days. We ask that you follow-up with your pharmacy.

## 2017-07-30 NOTE — Telephone Encounter (Signed)
See 07-27-17 telephone note for med refill; appears gabapentin was listed as no print. Please advise.

## 2017-07-30 NOTE — Telephone Encounter (Signed)
Patient advised.

## 2017-07-30 NOTE — Telephone Encounter (Signed)
What was the medicine that helped?  Let me know.  Thanks.

## 2017-07-30 NOTE — Telephone Encounter (Signed)
Pt requesting a refill of gabapentin(neurontin) 100 mg capsule.Attempted to call pt to clarify if medication was picked up from the pharmacy.Someone picked up the phone but did not say anything.  In chart it appears that medication was ordered on 07/21/17 but appears as no print.

## 2017-07-30 NOTE — Telephone Encounter (Signed)
Patient does not know the name of the medication nor who prescribed it, nor which pharmacy it was filled.  Patient says she thinks it was prescribed by either Dr. Glori Bickers or Dr. Deborra Medina.  I see a Meloxicam 7.5 mg (historical).  That's all I see.  Patient says it was a little white pill with E on it.  (?) Patient says she will try anything.  Patient says she thought it was maybe Naproxen. The nurse that she spoke with previously explained that Naproxen was not a muscle relaxer but for pain.

## 2017-07-30 NOTE — Telephone Encounter (Signed)
Copied from [place] [phone]. Topic: General - Other >> Jul 30, 2017  9:38 AM Yvette Rack wrote: Reason for CRM: patient calling back with the name of the medicine that helped her with her back the name was flexeril

## 2017-07-30 NOTE — Telephone Encounter (Signed)
Sent. Thanks. Update me as needed.  Sedation caution on med.

## 2017-07-31 MED ORDER — GABAPENTIN 100 MG PO CAPS: ORAL_CAPSULE | ORAL | 0 refills | Status: DC

## 2017-07-31 NOTE — Telephone Encounter (Signed)
Sent.  Thanks.  Prev gabapentin rx was to update the sig.

## 2017-08-03 ENCOUNTER — Encounter (HOSPITAL_COMMUNITY): Payer: Self-pay | Admitting: Emergency Medicine

## 2017-08-03 ENCOUNTER — Ambulatory Visit: Payer: No Typology Code available for payment source | Admitting: Family Medicine

## 2017-08-03 ENCOUNTER — Emergency Department (HOSPITAL_COMMUNITY)
Admission: EM | Admit: 2017-08-03 | Discharge: 2017-08-03 | Disposition: A | Discharge status: Home / Self Care | Payer: No Typology Code available for payment source | Attending: Emergency Medicine | Admitting: Emergency Medicine

## 2017-08-03 ENCOUNTER — Telehealth: Payer: Self-pay | Admitting: Family Medicine

## 2017-08-03 DIAGNOSIS — M545 Low back pain: Secondary | ICD-10-CM | POA: Diagnosis present

## 2017-08-03 DIAGNOSIS — Z79899 Other long term (current) drug therapy: Secondary | ICD-10-CM | POA: Insufficient documentation

## 2017-08-03 DIAGNOSIS — F17210 Nicotine dependence, cigarettes, uncomplicated: Secondary | ICD-10-CM | POA: Insufficient documentation

## 2017-08-03 DIAGNOSIS — M5442 Lumbago with sciatica, left side: Secondary | ICD-10-CM

## 2017-08-03 MED ORDER — KETOROLAC TROMETHAMINE 60 MG/2ML IM SOLN
60.0000 mg | Freq: Once | INTRAMUSCULAR | Status: AC
  Administered 2017-08-03: 60 mg via INTRAMUSCULAR
  Filled 2017-08-03: qty 2

## 2017-08-03 MED ORDER — DEXAMETHASONE SODIUM PHOSPHATE 10 MG/ML IJ SOLN
10.0000 mg | Freq: Once | INTRAMUSCULAR | Status: AC
  Administered 2017-08-03: 10 mg via INTRAMUSCULAR
  Filled 2017-08-03: qty 1

## 2017-08-03 NOTE — ED Triage Notes (Signed)
Reports low back pain that radiates down the left leg for 2-3 weeks.  Reports being seen by family physician 4 times.  Taking muscle relaxers and hydrocodone with no relief of pain.

## 2017-08-03 NOTE — ED Provider Notes (Signed)
[facility] EMERGENCY DEPARTMENT Provider Note   CSN: [id number] Arrival date & time: 08/03/17  1740     History   Chief Complaint Chief Complaint  Patient presents with  . Back Pain    HPI Julia [name] is a 52 y.o. female.  HPI  Julia [name] is a 52yo female with a history of tobacco use, hyperlipidemia and hyperglycemia who presents to the emergency department for evaluation of left lower back pain.  Patient reports that pain started about 3 weeks ago.  She denies inciting injury or heavy lifting that she is aware of.  Reports that pain is sharp and shooting and located over the left lower back and radiates down the left buttocks and into the anterior lower leg.  Pain is worsened with standing up or walking, improved somewhat with sitting still if she can get into the right position.  She has seen her PCP for this and been prescribed hydrocodone, Flexeril and is taking ibuprofen but she has not had significant improvement in her pain.  She also had an x-ray of her lumbar spine.  Per chart review it revealed lumbar facet osteoarthritis left worse than right.  She denies prior history of back injury or surgery.  She denies fevers, chills, unexpected weight loss, numbness, weakness, saddle anesthesia, loss of bowel or bladder control, abdominal pain, nausea/vomiting, diarrhea, dysuria, urinary frequency.  She denies history of IV drug use or personal history of cancer.  She is able to ambulate independently, although very painful.  Past Medical History:  Diagnosis Date  . GERD (gastroesophageal reflux disease)   . Smoking     Patient Active Problem List   Diagnosis Date Noted  . Sciatica, left side 07/18/2017  . Syncope 04/14/2017  . Cough 08/07/2016  . Rash 07/09/2016  . 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 Advance care planning 12/22/2014  . Plantar fasciitis 10/12/2014  . Shingles 11/14/2012  . Routine general medical examination at a  health care facility 05/07/2011  . SKIN LESION 06/22/2008  . TOBACCO ABUSE 01/27/2007  . HYPERLIPIDEMIA, MIXED 12/14/2006  . HYPERGLYCEMIA 06/05/2006    Past Surgical History:  Procedure Laterality Date  . CHOLECYSTECTOMY    . VULVECTOMY N/A 01/30/2015   Procedure: WIDE LOCAL EXCISION VULVA;  Surgeon: Everitt Amber, MD;  Location: [facility];  Service: Gynecology;  Laterality: N/A;     OB History   None      Home Medications    Prior to Admission medications   Medication Sig Start Date End Date Taking? Authorizing Provider  albuterol (PROVENTIL HFA;VENTOLIN HFA) 108 (90 Base) MCG/ACT inhaler Inhale 1-2 puffs into the lungs every 6 (six) hours as needed for wheezing or shortness of breath (or for cough). 08/07/16   Tonia Ghent, MD  atorvastatin (LIPITOR) 10 MG tablet TAKE 1 TABLET BY MOUTH EVERY DAY 03/24/16   Tonia Ghent, MD  cyclobenzaprine (FLEXERIL) 10 MG tablet Take 0.5-1 tablets (5-10 mg total) by mouth 3 (three) times daily as needed for muscle spasms. 07/30/17   Tonia Ghent, MD  diphenhydrAMINE (BENADRYL) 25 mg capsule Take 1 capsule (25 mg total) by mouth every 8 (eight) hours as needed for allergies. 08/07/16   Tonia Ghent, MD  gabapentin (NEURONTIN) 100 MG capsule TAKE UP TO 3 TABLETS 3 TIMES A DAY FOR PAIN 07/31/17   Tonia Ghent, MD  HYDROcodone-acetaminophen (NORCO) 5-325 MG tablet Take 1 tablet by mouth every 6 (six) hours as needed for  severe pain. Sedation caution 07/27/17   Tonia Ghent, MD  hydrOXYzine (ATARAX/VISTARIL) 10 MG tablet TAKE 1 TO 2 TABLETS BY MOUTH THREE TIMES DAILY AS NEEDED FOR ANXIETY 06/04/17   Tonia Ghent, MD  ibuprofen (ADVIL,MOTRIN) 600 MG tablet TAKE 1 TABLET BY MOUTH EVERY 8 HOURS AS NEEDED WITH FOOD 07/23/17   Tonia Ghent, MD  predniSONE (DELTASONE) 20 MG tablet Take 2 a day for 5 days, then 1 a day for 5 days, with food. Don't take with aleve/ibuprofen. 07/17/17   Tonia Ghent, MD    Family History Family History    Problem Relation Age of Onset  . Hypertension Mother   . Diabetes Mother        insulin dependent  . Heart disease Mother   . Diabetes Father   . Heart disease Father        CAD  . Hypertension Brother   . Kidney disease Brother        kidney failure/resolved  . Colon cancer Maternal Aunt        dx'd at ~62  . Breast cancer Neg Hx     Social History Social History   Tobacco Use  . Smoking status: Current Every Day Smoker    Packs/day: 1.50    Years: 31.00    Pack years: 46.50    Types: Cigarettes  . Smokeless tobacco: Never Used  Substance Use Topics  . Alcohol use: No    Alcohol/week: 0.0 oz  . Drug use: No     Allergies   Propoxyphene n-acetaminophen; Nabumetone; and Rofecoxib   Review of Systems Review of Systems  Constitutional: Negative for chills, fever and unexpected weight change.  Gastrointestinal: Negative for abdominal pain, diarrhea, nausea and vomiting.  Genitourinary: Negative for dysuria and frequency.  Musculoskeletal: Positive for back pain.  Skin: Negative for rash.  Neurological: Negative for weakness and numbness.     Physical Exam Updated Vital Signs BP 123/65   Pulse 79   Temp 98.4 F (36.9 C) (Oral)   Resp 18   Ht 5\' 4"  (1.626 m)   Wt 75.3 kg (166 lb)   SpO2 99%   BMI 28.49 kg/m   Physical Exam  Constitutional: She is oriented to person, place, and time. She appears well-developed and well-nourished. No distress.  HENT:  Head: Normocephalic and atraumatic.  Eyes: Right eye exhibits no discharge. Left eye exhibits no discharge.  Pulmonary/Chest: Effort normal. No respiratory distress.  Abdominal: Soft. Bowel sounds are normal. There is no tenderness. There is no guarding.  Musculoskeletal:  Tender to palpation over left paraspinal muscles of the lumbar spine.  No overlying rash or ecchymosis.  No midline T-spine or L-spine tenderness.  Strength 5/5 in bilateral knee flexion/extension and ankle dorsiflexion/plantarflexion.   DP pulses 2+ bilaterally.  Neurological: She is alert and oriented to person, place, and time. Coordination normal.  Distal sensation to light touch intact in bilateral lower extremities.  Patellar reflex 2+ and symmetric bilaterally.  Gait normal and coordination and balance.  Skin: Skin is warm and dry. Capillary refill takes less than 2 seconds. She is not diaphoretic.  Psychiatric: She has a normal mood and affect. Her behavior is normal.  Nursing note and vitals reviewed.    ED Treatments / Results  Labs (all labs ordered are listed, but only abnormal results are displayed) Labs Reviewed - No data to display  EKG None  Radiology No results found.  Procedures Procedures (including critical care time)  Medications Ordered in ED Medications  ketorolac (TORADOL) injection 60 mg (has no administration in time range)  dexamethasone (DECADRON) injection 10 mg (has no administration in time range)     Initial Impression / Assessment and Plan / ED Course  I have reviewed the triage vital signs and the nursing notes.  Pertinent labs & imaging results that were available during my care of the patient were reviewed by me and considered in my medical decision making (see chart for details).    Patient with back pain for the past three weeks. Had an outpatient lumbar spine Xray which showed osteoarthritic changes.  She has no neurological deficits and normal neuro exam.  Patient can walk but states is painful.  No loss of bowel or bladder control. No concern for cauda equina.  No fever, weight loss, h/o cancer, IVDU.  Patient has seemed to fail conservative management including muscle relaxer, NSAID and ibuprofen.  Will give Decadron and Toradol in the emergency department and have her follow-up with neurosurgery for further management.  Discussed reasons to return to the emergency department and she agrees and voiced understanding above plan and has no complaints prior to  discharge.  Final Clinical Impressions(s) / ED Diagnoses   Final diagnoses:  Acute left-sided low back pain with left-sided sciatica    ED Discharge Orders    None       Bernarda Caffey 08/03/17 1607    Tegeler, Gwenyth Allegra, MD 08/03/17 [id number]

## 2017-08-03 NOTE — Telephone Encounter (Signed)
Patient says her pain level hasn't been too bad since she returned from ER because she hasn't really been up on it.  It was about a 6.  Patient says they gave her a paper with a phone number to call to set up the appointment.  Patient advised that if she has any trouble getting the appointment, to let us know.

## 2017-08-03 NOTE — Telephone Encounter (Signed)
Please get update on patient after ER visit.  How is her pain level?   They rec'd neurosurgery eval.  If she needs referral then let me know.   Thanks.

## 2017-08-03 NOTE — Discharge Instructions (Addendum)
Please continue taking her prescribed muscle relaxer and ibuprofen at home.  Please call and schedule an appointment with neurosurgery for follow up, I have listed and highlighted the information above.  As we discussed, please return to the emergency department if you have any new or worsening symptoms like numbness in which she cannot feel your feet or legs, weakness, loss of bowel or bladder control.

## 2017-08-05 ENCOUNTER — Other Ambulatory Visit: Payer: Self-pay | Admitting: Family Medicine

## 2017-08-05 DIAGNOSIS — M5432 Sciatica, left side: Secondary | ICD-10-CM

## 2017-08-05 MED ORDER — HYDROCODONE-ACETAMINOPHEN 5-325 MG PO TABS: 1.0000 | ORAL_TABLET | Freq: Four times a day (QID) | ORAL | 0 refills | Status: DC | PRN

## 2017-08-05 NOTE — Telephone Encounter (Signed)
Hydrocodone refill Last OV:07/21/17 Last filled:07/27/17 20 tab/0 refills PCP: [place]: [id number] [place], [place] - [address] [phone] (Phone) [phone] (Fax)

## 2017-08-05 NOTE — Telephone Encounter (Signed)
Pt requesting refill hydrocodone apap to [facility]. Last refilled # 20 on 07/27/17 Last seen 07/21/17.

## 2017-08-05 NOTE — Telephone Encounter (Signed)
Sent. Thanks.  What is her status with neurosurgery?

## 2017-08-05 NOTE — Telephone Encounter (Signed)
Copied from [place] [id number]. Topic: Quick Communication - Rx Refill/Question >> Aug 05, 2017  1:48 PM Aurelio Brash B wrote: Medication: HYDROcodone-acetaminophen ([place]) 5-325 MG tablet   Has the patient contacted their pharmacy? No  (Agent: If no, request that the patient contact the pharmacy for the refill.  Preferred Pharmacy (with phone number or street name):[id number] [place], [place] - [address] [phone] (Phone) [phone] (Fax)    PT states she has an apt with neuro surgeon on May 13th    Agent: Please be advised that RX refills may take up to 3 business days. We ask that you follow-up with your pharmacy.

## 2017-08-06 NOTE — Telephone Encounter (Signed)
Patient has Neurosurgery appointment on May 13th.  Patient says that Neurosurgery asked that Dr. Damita Dunnings schedule an MRI prior to their visit so that they will have the results and can possibly proceed with injections at that time.

## 2017-08-10 NOTE — Telephone Encounter (Signed)
Patient advised.

## 2017-08-10 NOTE — Telephone Encounter (Signed)
I put in the order for the MRI.  Thanks.

## 2017-08-11 ENCOUNTER — Ambulatory Visit
Admission: RE | Admit: 2017-08-11 | Discharge: 2017-08-11 | Disposition: A | Discharge status: Home / Self Care | Payer: No Typology Code available for payment source | Source: Ambulatory Visit | Attending: Family Medicine | Admitting: Family Medicine

## 2017-08-11 DIAGNOSIS — M5432 Sciatica, left side: Secondary | ICD-10-CM

## 2017-08-12 ENCOUNTER — Other Ambulatory Visit: Payer: No Typology Code available for payment source

## 2017-08-13 ENCOUNTER — Telehealth: Payer: Self-pay

## 2017-08-13 ENCOUNTER — Other Ambulatory Visit: Payer: Self-pay | Admitting: Family Medicine

## 2017-08-13 DIAGNOSIS — M545 Low back pain, unspecified: Secondary | ICD-10-CM

## 2017-08-13 DIAGNOSIS — M79605 Pain in left leg: Principal | ICD-10-CM

## 2017-08-13 MED ORDER — HYDROCODONE-ACETAMINOPHEN 5-325 MG PO TABS: 1.5000 | ORAL_TABLET | Freq: Four times a day (QID) | ORAL | 0 refills | Status: DC | PRN

## 2017-08-13 NOTE — Telephone Encounter (Signed)
Copied from [place] [id number]. Topic: General - Other >> Aug 13, 2017  9:35 AM Yvette Rack wrote: Reason for CRM: Pt called in requested MRI results. Pt requesting a call back to discuss MRI results. Cb# [phone].

## 2017-08-13 NOTE — Progress Notes (Signed)
rx sent.  Letter done.  Please make sure that the letter printed for pick up.  I'll sign early Friday AM.  Thanks.

## 2017-08-14 NOTE — Progress Notes (Signed)
Left detailed message on voicemail (on DPR)  that letter is up front ready for pickup.

## 2017-08-14 NOTE — Telephone Encounter (Signed)
Lugene CMA per result notes spoke with pt on 08/13/17 at 6:48 pm.

## 2017-08-18 DIAGNOSIS — M47816 Spondylosis without myelopathy or radiculopathy, lumbar region: Secondary | ICD-10-CM | POA: Insufficient documentation

## 2017-08-26 ENCOUNTER — Telehealth: Payer: Self-pay | Admitting: Family Medicine

## 2017-08-26 NOTE — Telephone Encounter (Signed)
Copied from [place] [id number]. Topic: Quick Communication - See Telephone Encounter >> Aug 26, 2017  5:49 PM Robina Ade, Helene Kelp D wrote: CRM for notification. See Telephone encounter for: 08/26/17. Patient called and would like to talk to Dr. Damita Dunnings or his CMA about returning back to work and see what he suggest for her to do. Please call Patient back thanks.

## 2017-08-27 NOTE — Telephone Encounter (Addendum)
Patient needs a note up until 09/08/17 when she sees Neurosurgery again.  Patient is not sure when the last note covered.  Last note was written on 08/13/17 for an indefinite return to work.  Please advise.

## 2017-08-27 NOTE — Telephone Encounter (Signed)
If she is in enough pain not to be able to work, then she can't work.   Okay with me to give her a note for the dates needed.  Thanks.

## 2017-08-27 NOTE — Telephone Encounter (Signed)
I spoke with Julia [name]; last seen by Dr Damita Dunnings on 07/21/17.Julia [name] saw Neurosurgeon and has another appt to see neurosurgeon on 09/08/17 for injections in hip. Julia [name] thinks if she goes back to work she will make her back and hip pain worse. If Julia [name] calls neurosurgeon she gets answering machine and then may get a cb 1 -2  Days later and Julia [name] has not talked to neurosurgeon about remaining out of work. Julia [name] said if she stands very long she has a lot of pain in hip and back. Julia [name] request Dr Josefine Class opinion.

## 2017-08-27 NOTE — Telephone Encounter (Signed)
Left detailed message on voicemail and placed letter at front desk for pickup.

## 2017-08-27 NOTE — Telephone Encounter (Signed)
Letter done. Thanks.

## 2017-09-09 ENCOUNTER — Other Ambulatory Visit: Payer: Self-pay | Admitting: Family Medicine

## 2017-09-09 NOTE — Telephone Encounter (Signed)
Copied from [place] [id number]. Topic: Quick Communication - Rx Refill/Question >> Sep 09, 2017 10:28 AM Aurelio Brash B wrote: Medication: HYDROcodone-acetaminophen ([place]) 5-325 MG tablet  Has the patient contacted their pharmacy? Yes  (Agent: If no, request that the patient contact the pharmacy for the refill.) (Agent: If yes, when and what did the pharmacy advise?)  Preferred Pharmacy (with phone number or street name): [id number] [place], [place] - [address] [phone] (Phone) [phone] (Fax)     Agent: Please be advised that RX refills may take up to 3 business days. We ask that you follow-up with your pharmacy.

## 2017-09-10 NOTE — Telephone Encounter (Signed)
Name of Medication: hydrocodone apap Name of [facility]acy[facility] [facility] [place] or Written Date and Quantity: # 40 on 08/13/17 Last Office Visit and Type: 07/21/17 Next Office Visit and Type: no upcoming appt scheduled Last Controlled Substance Agreement Date: do not see one in chart Last UDS: do not see  Under lab tab. Please advise.

## 2017-09-10 NOTE — Telephone Encounter (Signed)
Norco 5-325 mg refill request  LOV 07/21/17 with Dr. Damita Dunnings  Last refill:  08/13/17  #40  0 refills  [facility] [address] [place] [facility] [address]

## 2017-09-11 MED ORDER — HYDROCODONE-ACETAMINOPHEN 5-325 MG PO TABS: 1.5000 | ORAL_TABLET | Freq: Four times a day (QID) | ORAL | 0 refills | Status: DC | PRN

## 2017-09-11 NOTE — Telephone Encounter (Signed)
Sent. Thanks.  When did she see neurosurgery?  What is her status?

## 2017-09-11 NOTE — Telephone Encounter (Signed)
Patient saw Neurosurgery either Tuesday or Wednesday.  They diagnosed Sciatic nerve and gave her an injection which has helped a little.  She is to return on July 22nd for another injection.

## 2017-09-12 NOTE — Telephone Encounter (Signed)
Noted. Thanks.

## 2017-10-02 ENCOUNTER — Other Ambulatory Visit: Payer: Self-pay | Admitting: Family Medicine

## 2017-10-05 ENCOUNTER — Other Ambulatory Visit: Payer: Self-pay | Admitting: *Deleted

## 2017-10-05 NOTE — Telephone Encounter (Signed)
Electronic refill request Last office visit 07/21/17 Last refill Gabapentin 07/31/17 #270 Ibuprofen last refill 07/23/17 #90/1 See allergy/contraindication

## 2017-10-05 NOTE — Telephone Encounter (Signed)
Last office visit 07/21/2017.  Last refilled Ibuprofen-07/23/2017 for #90 with 1 refill.  Gabapentin-07/31/2017 for #270 with no refills. Ok to refill?

## 2017-10-06 MED ORDER — GABAPENTIN 100 MG PO CAPS: ORAL_CAPSULE | ORAL | 0 refills | Status: DC

## 2017-10-06 MED ORDER — IBUPROFEN 600 MG PO TABS
ORAL_TABLET | ORAL | 2 refills | Status: DC
Start: 2017-10-06 — End: 2018-02-19

## 2017-10-06 NOTE — Telephone Encounter (Signed)
When is she seeing the spine clinic?

## 2017-10-06 NOTE — Telephone Encounter (Signed)
Called and spoke with patient she states her Pain level is currently at a 4.  Understanding verbalized nothing further needed at this time.

## 2017-10-06 NOTE — Telephone Encounter (Signed)
See other note

## 2017-10-06 NOTE — Telephone Encounter (Signed)
Left message on voicemail for patient to call back. 

## 2017-10-06 NOTE — Telephone Encounter (Signed)
Both sent.  What is her status now re: pain, etc.  Please let me known.  Thanks.

## 2017-10-07 ENCOUNTER — Telehealth: Payer: Self-pay

## 2017-10-07 NOTE — Telephone Encounter (Deleted)
Copied from [place] [phone]. Topic: Quick Communication - See Telephone Encounter >> Oct 06, 2017 12:50 PM Laws, [place] [employer], LPN wrote: CRM for notification. See Telephone encounter for: 10/06/17. Okay for PEC/triage nurse to ask patient when her appointment is with spine clinic when she calls back. >> Oct 07, 2017 11:25 AM Percell Belt A wrote: Pt called back in and stated that her next appt with spine clinic  is July 22nd.

## 2017-10-07 NOTE — Telephone Encounter (Signed)
Was put with refill note already done.

## 2017-10-07 NOTE — Telephone Encounter (Signed)
Copied from [place] [id number]. Topic: Quick Communication - See Telephone Encounter >> Oct 06, 2017 12:50 PM Laws, [place] [employer]ers, LPN wrote: CRM for notification. See Telephone encounter for: 10/06/17. Okay for PEC/triage nurse to ask patient when her appointment is with spine clinic when she calls back. >> Oct 07, 2017 11:25 AM Percell Belt A wrote: Pt called back in and stated that her next appt with spine clinic  is July 22nd.

## 2017-10-07 NOTE — Telephone Encounter (Signed)
Noted. Thanks.  I'll await the notes.

## 2017-10-28 ENCOUNTER — Ambulatory Visit: Payer: Self-pay

## 2017-10-28 NOTE — Telephone Encounter (Signed)
Patient called in with c/o "foul smelling urine." She says "I noticed it a couple of days ago at night and when I get up in the morning. I don't have pain, but pressure after urinating and feeling like I have to urinate again." I asked about the color of the urine, she says "yellow, cloudy." I asked about fever, flank pain, blood in urine, pain with urination, she denies. According to protocol, see PCP within 24 hours, no availability with PCP, there is availability with 2 providers in the morning, she says "I have to be at work from 6am-2pm and I will need an afternoon appointment." I offered another Conservation officer, historic buildings or go to an UC or ED, she says "I will go to the UC tomorrow when I get off work to the one on [address]." Care advice given, patient verbalized understanding.   Reason for Disposition . Bad or foul-smelling urine  Answer Assessment - Initial Assessment Questions 1. SYMPTOM: "What's the main symptom you're concerned about?" (e.g., frequency, incontinence)     Foul smelling, not emptying bladder 2. ONSET: "When did the foul smelling start?"     Couple of days ago 3. PAIN: "Is there any pain?" If so, ask: "How bad is it?" (Scale: 1-10; mild, moderate, severe)     No 4. CAUSE: "What do you think is causing the symptoms?"     I don't know 5. OTHER SYMPTOMS: "Do you have any other symptoms?" (e.g., fever, flank pain, blood in urine, pain with urination)     Pressure after the stream of urine 6. PREGNANCY: "Is there any chance you are pregnant?" "When was your last menstrual period?"     No  Protocols used: URINARY [facility]

## 2017-10-29 MED ORDER — HYDROCODONE-ACETAMINOPHEN 5-325 MG PO TABS: 1.5000 | ORAL_TABLET | Freq: Four times a day (QID) | ORAL | 0 refills | Status: DC | PRN

## 2017-10-29 NOTE — Addendum Note (Signed)
Addended by: Helene Shoe on: 10/29/2017 08:41 AM   Modules accepted: Orders

## 2017-10-29 NOTE — Addendum Note (Signed)
Addended by: Tonia Ghent on: 10/29/2017 05:57 PM   Modules accepted: Orders

## 2017-10-29 NOTE — Telephone Encounter (Addendum)
I spoke with pt; pt did not go to UC and cannot afford appt at [facility]. [facility] urine problem. Pt will cb if needed. Pt also request refill hydrocodone apap 5-325. Name of Medication: hydrocodone apap 5-325. [facility] [place] [facility] Written Date and Quantity: # 40 on 09/11/17 Last Office Visit and Type:07/21/17 back and knee pain Next Office Visit and Type: no future appt scheduled. Last Controlled Substance Agreement Date: none Last QVL:DKCC

## 2017-10-29 NOTE — Telephone Encounter (Signed)
F/u as needed MO:QHUTMLY sx.  If she is better, then I'll defer.   I sent the rx for hydrocodone but I need her neurosurgery notes.  Thanks.  [place] data base checked, consistent.

## 2017-10-30 NOTE — Telephone Encounter (Signed)
Left detailed message on voicemail of patient.  Faxed for Neurosurgery notes.

## 2017-11-01 ENCOUNTER — Telehealth: Payer: Self-pay | Admitting: Family Medicine

## 2017-11-01 NOTE — Telephone Encounter (Signed)
Call patient.  Neurosurgery notes reviewed.  These just came in.  Neurosurgery mentions refer to physical therapy versus CT of abdomen pelvis if her pain is not improving.  She either needs to follow-up here or with the neurosurgery clinic if her pain is not improved.  Thanks.

## 2017-11-02 NOTE — Telephone Encounter (Signed)
Left detailed message on voicemail.  

## 2017-12-08 ENCOUNTER — Other Ambulatory Visit: Payer: Self-pay | Admitting: Family Medicine

## 2017-12-08 NOTE — Telephone Encounter (Signed)
Copied from [place] [id number]. Topic: Quick Communication - Rx Refill/Question >> Dec 08, 2017  6:38 PM Blase Mess A wrote: Medication: HYDROcodone-acetaminophen ([place]) 5-325 MG tablet [[id number]]   Has the patient contacted their pharmacy? Yes  (Agent: If no, request that the patient contact the pharmacy for the refill.) (Agent: If yes, when and what did the pharmacy advise?)  Preferred Pharmacy (with phone number or street name): [id number] [place], [place] - [address] [address]    Agent: Please be advised that RX refills may take up to 3 business days. We ask that you follow-up with your pharmacy.

## 2017-12-08 NOTE — Telephone Encounter (Signed)
Electronic refill request Last refill 10/06/17 #270 Last office visit 07/21/17

## 2017-12-09 MED ORDER — HYDROCODONE-ACETAMINOPHEN 5-325 MG PO TABS: 1.5000 | ORAL_TABLET | Freq: Four times a day (QID) | ORAL | 0 refills | Status: DC | PRN

## 2017-12-09 NOTE — Telephone Encounter (Signed)
Okay to continue- this is a recent issue for patient and she is seeing the spine clinic for injections.  [place] database appropriate.  Sent. Thanks.

## 2017-12-09 NOTE — Telephone Encounter (Signed)
Name of Medication: hydrocodone apap 5-325 Name of Pharmacy: [facility] [place] [place] [facility] Written Date and Quantity: # 40 on 10/29/17 Last Office Visit and Type: 07/21/17 back & knee pain Next Office Visit and Type: none Last Controlled Substance Agreement Date: none Last KBT:CYEL

## 2017-12-09 NOTE — Telephone Encounter (Signed)
Sent. Thanks.   

## 2017-12-30 ENCOUNTER — Telehealth: Payer: Self-pay | Admitting: Family Medicine

## 2017-12-30 NOTE — Telephone Encounter (Signed)
Copied from [place] [phone]. Topic: General - Other >> Dec 30, 2017  3:20 PM Judyann Munson wrote: Reason for CRM: patient is calling to requesting something to be called in, she stated when she urine it has a smells. The patient is out of town, and is requesting [facility]. Her son will be in town and stated he will pick it up for her. She is requesting a call back at [phone].

## 2017-12-30 NOTE — Telephone Encounter (Signed)
Spoke to pt and advised she would need OV; offered pt appt and advised UA and possible culture may be needed. Pt states she is out of town and will figure something out as she is unable to be seen in office

## 2017-12-31 NOTE — Telephone Encounter (Signed)
Patient is not experiencing any burning, just a foul odor.  Patient advised to increase fluid intake and needs to be seen if no improvement.

## 2017-12-31 NOTE — Telephone Encounter (Signed)
Agree with trying to get patient seen.  If no burning with urination, then try to inc fluid intake.  If burning or not getting better, then needs to get checked.  Thanks.

## 2018-01-14 ENCOUNTER — Encounter: Payer: Self-pay | Admitting: Family Medicine

## 2018-01-14 ENCOUNTER — Ambulatory Visit: Payer: No Typology Code available for payment source | Admitting: Family Medicine

## 2018-01-14 VITALS — BP 92/60 | HR 62 | Temp 98.6°F | Ht 64.0 in | Wt 160.0 lb

## 2018-01-14 DIAGNOSIS — M5432 Sciatica, left side: Secondary | ICD-10-CM

## 2018-01-14 DIAGNOSIS — R300 Dysuria: Secondary | ICD-10-CM

## 2018-01-14 LAB — POC URINALSYSI DIPSTICK (AUTOMATED)
Bilirubin, UA: NEGATIVE
Blood, UA: NEGATIVE
Glucose, UA: NEGATIVE
Ketones, UA: NEGATIVE
Leukocytes, UA: NEGATIVE
Nitrite, UA: NEGATIVE
Protein, UA: NEGATIVE
Spec Grav, UA: 1.015 (ref 1.010–1.025)
Urobilinogen, UA: 0.2 E.U./dL
pH, UA: 6.5 (ref 5.0–8.0)

## 2018-01-14 MED ORDER — HYDROCODONE-ACETAMINOPHEN 5-325 MG PO TABS: 1.5000 | ORAL_TABLET | Freq: Four times a day (QID) | ORAL | 0 refills | Status: DC | PRN

## 2018-01-14 NOTE — Progress Notes (Signed)
Has been off lipitor for about 1 year.   This was tabled for now as she has other more pressing issues.  She is walking better but she is still having a sig amount of pain.  She had had injections in the meantime with relief for about 2-3 weeks with each injection.  She has had 3 injections.  She doesn't want to be miserable, which is understandable.    She still has pain with sitting and when up walking.  Pain in L leg.  No R sided sx.  Hydrocodone helps enough for her to work.  No ADE on med.  [place] database reviewed, appropriated.  No FCNAVD.    No dysuria now but did about 1 week ago.  He has been drinking more water and cranberry juice.  Noticed a difference in odor, more in the AM after getting out of bed.    She had been episodically lightheaded right upon standing but no syncope.  She has had a cough recently but this is gotten better in the last few days and she is still without fever.  Meds, vitals, and allergies reviewed.   ROS: Per HPI unless specifically indicated in ROS section   nad ncat rrr ctab abd soft, not ttp L SLR positive. R SLR neg.  L lower back ttp.  Midline back not tender.  No CVA pain. Extremities without edema Able to bear weight. No gross weakness in the bilateral lower extremities.  No gross loss of sensation.

## 2018-01-14 NOTE — Patient Instructions (Addendum)
We'll contact you with your lab report. Let me check with the neurosurgery clinic.  Use the pain medicine as needed.  Sedation caution.  Take care.  Glad to see you.

## 2018-01-15 ENCOUNTER — Telehealth: Payer: Self-pay

## 2018-01-15 NOTE — Telephone Encounter (Signed)
Left detailed message on voicemail.  

## 2018-01-15 NOTE — Telephone Encounter (Signed)
Still pending. Will notify when resulted.  Make sure she drinks plenty of water.  If persistently low BP on home check or if feeling worse then update [place].  Thanks.

## 2018-01-15 NOTE — Telephone Encounter (Signed)
Copied from [place] [phone]. Topic: Quick Communication - Lab Results (Clinic Use ONLY) >> Jan 15, 2018  2:02 PM Julia [name] wrote: Pt called to get the urine culture results when ready; contact to advise

## 2018-01-16 LAB — URINE CULTURE
MICRO NUMBER:: 91220495
SPECIMEN QUALITY:: ADEQUATE

## 2018-01-17 ENCOUNTER — Telehealth: Payer: Self-pay | Admitting: Family Medicine

## 2018-01-17 ENCOUNTER — Other Ambulatory Visit: Payer: Self-pay | Admitting: Family Medicine

## 2018-01-17 MED ORDER — NITROFURANTOIN MONOHYD MACRO 100 MG PO CAPS: 100.0000 mg | ORAL_CAPSULE | Freq: Two times a day (BID) | ORAL | 0 refills | Status: DC

## 2018-01-17 NOTE — Telephone Encounter (Signed)
Please call Dr. Teressa Lower at [facility] about follow up.  Patient is having several weeks of relief from injection but then she clearly regresses with significant left-sided pain.  I refilled her hydrocodone in the meantime.  The patient has significant financial limitations and I need their input.  Thanks.

## 2018-01-17 NOTE — Assessment & Plan Note (Addendum)
At this point, still okay for outpatient f/u.   D/w pt about options.  We will call Dr. Maryjean Ka about follow up at [facility]. In the meantime still okay to use hydrocodone as needed with routine cautions.  [place] database reviewed, appropriate.  Her dysuria appears to be a separate issue.  Check culture today.  See notes on labs.  Would not start antibiotics at time of office visit since it is not burning when she urinates.  Encouraged adequate fluid intake.  Her cough appears to be resolving.  Her lungs are clear.  If she drinks more fluid to help with malodorous urine and that may resolve her issues with being slightly lightheaded.  Still okay for outpatient follow-up.

## 2018-01-19 NOTE — Telephone Encounter (Signed)
Dr. Maryjean Ka' secretary says that a series of 3 injections had been originally ordered q3 mos.  Patient is due for another injection anyway and can speak to the MD at that time about her symptoms regressing after several weeks.  Patient should call in for an appointment for the injection.  Shall I let her know?

## 2018-01-20 NOTE — Telephone Encounter (Signed)
Left message on voicemail for patient to call back. 

## 2018-01-20 NOTE — Telephone Encounter (Signed)
Pt returning call and information relayed to pt per notes of Lugene,CMA. Pt verbalized understanding and states she will contact the office to schedule an appointment for the injection.

## 2018-01-20 NOTE — Telephone Encounter (Signed)
FYI to [place].

## 2018-01-20 NOTE — Telephone Encounter (Signed)
Please, yes, thank you.  I will await update from follow-up.

## 2018-01-25 ENCOUNTER — Other Ambulatory Visit: Payer: Self-pay | Admitting: Occupational Medicine

## 2018-01-25 ENCOUNTER — Ambulatory Visit: Payer: Self-pay

## 2018-01-25 DIAGNOSIS — M25532 Pain in left wrist: Secondary | ICD-10-CM

## 2018-02-01 ENCOUNTER — Ambulatory Visit: Payer: No Typology Code available for payment source | Admitting: Family Medicine

## 2018-02-01 ENCOUNTER — Encounter: Payer: Self-pay | Admitting: Family Medicine

## 2018-02-01 DIAGNOSIS — J0110 Acute frontal sinusitis, unspecified: Secondary | ICD-10-CM | POA: Diagnosis not present

## 2018-02-01 DIAGNOSIS — G43909 Migraine, unspecified, not intractable, without status migrainosus: Secondary | ICD-10-CM | POA: Diagnosis not present

## 2018-02-01 MED ORDER — PREDNISONE 10 MG PO TABS: ORAL_TABLET | ORAL | 0 refills | Status: DC

## 2018-02-01 MED ORDER — CYCLOBENZAPRINE HCL 10 MG PO TABS: 5.0000 mg | ORAL_TABLET | Freq: Three times a day (TID) | ORAL | 1 refills | Status: DC | PRN

## 2018-02-01 MED ORDER — DOXYCYCLINE HYCLATE 100 MG PO TABS: 100.0000 mg | ORAL_TABLET | Freq: Two times a day (BID) | ORAL | 0 refills | Status: DC

## 2018-02-01 NOTE — Progress Notes (Signed)
Cough.  Going on for several weeks.  Green sputum.  No wheeze.  No vomiting.  No fevers.  SABA helps, with more sputum after use.    She'll call about f/u with the spine clinic.  D/w pt.    Migraine.  Started yesterday.  Photophobia more yesterday.  No photophobia.  Frontal HA.  Throbbing.  No nausea.  H/o similar in the past.  Smoking, considering cessation, encouraged.  No recent flexeril use, has used in the distant past with relief.    L wrist in brace after "hairline fx" noted last week.  Was seen through workman's comp.    Meds, vitals, and allergies reviewed.   ROS: Per HPI unless specifically indicated in ROS section   GEN: nad, alert and oriented HEENT: mucous membranes moist, tm w/o erythema, nasal exam w/o erythema, clear discharge noted,  OP with cobblestoning NECK: supple w/o LA CV: rrr.   PULM: ctab, no inc wob EXT: no edema SKIN: no acute rash B frontal sinuses ttp.  Left wrist in brace.

## 2018-02-01 NOTE — Patient Instructions (Signed)
Start doxycycline and use your inhaler for the cough.  Start flexeril for the headaches.  Sedation caution.  If you are wheezing more, then start prednisone (with food) but stop ibuprofen. Out of work for now.  Update me as needed.   Take care.  Glad to see you.

## 2018-02-04 DIAGNOSIS — J0110 Acute frontal sinusitis, unspecified: Secondary | ICD-10-CM | POA: Insufficient documentation

## 2018-02-04 DIAGNOSIS — G43909 Migraine, unspecified, not intractable, without status migrainosus: Secondary | ICD-10-CM | POA: Insufficient documentation

## 2018-02-04 NOTE — Assessment & Plan Note (Signed)
Rest, fluids, Flexeril, routine cautions given.  Update as needed.  She agrees.

## 2018-02-04 NOTE — Assessment & Plan Note (Signed)
Lungs clear.  Okay for outpatient follow-up.  Start doxycycline.  Routine cautions given.  If she is wheezing more then start prednisone but stop any other NSAIDs with that.  She can hold the steroids for now.  Update me as needed.

## 2018-02-19 ENCOUNTER — Telehealth: Payer: Self-pay | Admitting: Family Medicine

## 2018-02-19 MED ORDER — IBUPROFEN 600 MG PO TABS: ORAL_TABLET | ORAL | 2 refills | Status: DC

## 2018-02-19 MED ORDER — HYDROCODONE-ACETAMINOPHEN 5-325 MG PO TABS: 1.5000 | ORAL_TABLET | Freq: Four times a day (QID) | ORAL | 0 refills | Status: DC | PRN

## 2018-02-19 NOTE — Telephone Encounter (Signed)
LM with details (okay per DPR) on cell VM.  Patient is to call back with update on when her appt is with the spine clinic.

## 2018-02-19 NOTE — Telephone Encounter (Signed)
Name of Medication: Hydrocodone, Ibuprofen Name of [facility]acy[facility] Written Date and Quantity: Hydrocodone 01/14/18 #40 Ibuprofen last refill 10/06/17 #90/2 Last Office Visit and Type 02/01/18:  Next Office Visit and Type: none scheduled Last Controlled Substance Agreement Date: 10/31/13 Last UDS 10/31/13

## 2018-02-19 NOTE — Telephone Encounter (Signed)
Sent ibuprofen and hydrocodone rxs.   database reviewed, appropriate.  Please see when she is going to f/u with the spine clinic.  Thanks.

## 2018-02-19 NOTE — Telephone Encounter (Signed)
° ° ° ° °  1. Which medications need to be refilled? (please list name of each medication and dose if known)  Hydrocodone 325mg  Ibuprofen 600 mg 2. Which pharmacy/location (including street and city if local pharmacy) is medication [facility] sent to?[facility]  Pt stated 90 day supply

## 2018-02-23 NOTE — Telephone Encounter (Signed)
LM asking for update on appt with spine clinic.

## 2018-02-25 NOTE — Telephone Encounter (Signed)
Left detailed message on voicemail for the 3rd time.

## 2018-04-14 ENCOUNTER — Other Ambulatory Visit: Payer: Self-pay | Admitting: Family Medicine

## 2018-04-14 MED ORDER — HYDROCODONE-ACETAMINOPHEN 5-325 MG PO TABS: 1.5000 | ORAL_TABLET | Freq: Four times a day (QID) | ORAL | 0 refills | Status: DC | PRN

## 2018-04-14 NOTE — Telephone Encounter (Signed)
Pt returned call to [place].

## 2018-04-14 NOTE — Telephone Encounter (Signed)
Sent.  [place] database reviewed.  Appropriate. I need update from patient about her status with the back clinic.  Please let me know how she is doing and when she is going to see them again.  Thanks.

## 2018-04-14 NOTE — Telephone Encounter (Signed)
Sent. Thanks.   

## 2018-04-14 NOTE — Telephone Encounter (Signed)
Name of Medication: Hydrocodone apap 5-325 mg Name of [facility]acy[facility] [place] [facility] Written Date and Quantity: # 40 on 02/19/18 Last Office Visit and Type: 02/01/18 acute Next Office Visit and Type: none scheduled Last Controlled Substance Agreement Date: none found Last UDS: none found  Pt is out of med and pt notified the need to call 48-72 hrs prior to needing med. Pt voiced understanding.

## 2018-04-14 NOTE — Telephone Encounter (Addendum)
Noted.  I would like her to follow-up with the back clinic when possible, acknowledging the issues below.  Thanks.

## 2018-04-14 NOTE — Telephone Encounter (Signed)
Electronic refill request Gabapentin Last office visit 02/01/18 Last refill 12/09/17 #270

## 2018-04-14 NOTE — Telephone Encounter (Signed)
Left message on voicemail for patient to call back. 

## 2018-04-14 NOTE — Telephone Encounter (Signed)
Electronic refill request Flexeril Last office visit 02/01/18 Last refill 02/01/18 #30/1

## 2018-04-14 NOTE — Telephone Encounter (Signed)
Spoke to patient and was advised that her [employer] owes her back doctor $300 and they will not see her until that payment is taken care of. Patient stated that she has talked with her [employer] and they are trying to get this cleared up. Patient stated that she does not know when this will be taken care of.

## 2018-04-16 NOTE — Telephone Encounter (Signed)
I left a detailed message at the pts cell number with the information below. 

## 2018-07-05 ENCOUNTER — Other Ambulatory Visit: Payer: Self-pay

## 2018-07-05 NOTE — Telephone Encounter (Signed)
Name of Medication: hydrocodone apap 5 -325 mg Name of [facility]acy[facility] Written Date and Quantity:# 40 on 04/14/18  Last Office Visit and Type:02/01/18 H/A & 01/14/18 med FU & sciatica  Next Office Visit and Type: none scheduled Last Controlled Substance Agreement Date: none seen Last BEM:LJQG seen   Name of Medication: cyclobenzaprine 10 mg [facility] Date and Quantity:# 30 x1 on 04/14/18  Last Office Visit and Type:02/01/18 H/A & 01/14/18 med FU & sciatica  Next Office Visit and Type: none scheduled Last Controlled Substance Agreement Date: none seen Last BEE:FEOF seen

## 2018-07-06 NOTE — Telephone Encounter (Signed)
I need update on patient first.  What is her status, when will she f/u with the back clinic?  Let me know.  Thanks.

## 2018-07-06 NOTE — Telephone Encounter (Signed)
Spoke to pt. She lost her job so she has no insurance and cannot afford to go to them right now.

## 2018-07-07 MED ORDER — CYCLOBENZAPRINE HCL 10 MG PO TABS: ORAL_TABLET | ORAL | 1 refills | Status: DC

## 2018-07-07 MED ORDER — HYDROCODONE-ACETAMINOPHEN 5-325 MG PO TABS: 1.0000 | ORAL_TABLET | Freq: Four times a day (QID) | ORAL | 0 refills | Status: DC | PRN

## 2018-07-07 NOTE — Telephone Encounter (Signed)
Left message on vm per DPR for pt.

## 2018-07-07 NOTE — Telephone Encounter (Signed)
Given the situation, I think it makes sense to fill the prescriptions.  Hydrocodone was last filled in January.  This is not accelerated use.  Update me as needed, especially if her symptoms are changing.  Thanks.  rxs sent.

## 2018-08-20 ENCOUNTER — Telehealth: Payer: Self-pay | Admitting: Family Medicine

## 2018-08-20 NOTE — Telephone Encounter (Signed)
Dr. Damita Dunnings please advise last OV 01/2018 last refill 07/07/2018 #40 no refills.

## 2018-08-20 NOTE — Telephone Encounter (Signed)
Patient called to get a refill on her Hydrocodone.  [facility].

## 2018-08-22 MED ORDER — HYDROCODONE-ACETAMINOPHEN 5-325 MG PO TABS: 1.0000 | ORAL_TABLET | Freq: Four times a day (QID) | ORAL | 0 refills | Status: DC | PRN

## 2018-08-22 NOTE — Telephone Encounter (Signed)
Prescription sent.  Please schedule welcome/phone visit follow-up when possible.  Thanks.

## 2018-08-23 NOTE — Telephone Encounter (Signed)
Appt made for 5/21. Pt is self pay

## 2018-08-23 NOTE — Telephone Encounter (Signed)
Please schedule appointment as instructed. 

## 2018-08-23 NOTE — Telephone Encounter (Signed)
lvm pt needs virtual/phone appt with Dr. Damita Dunnings

## 2018-08-26 ENCOUNTER — Ambulatory Visit (INDEPENDENT_AMBULATORY_CARE_PROVIDER_SITE_OTHER): Payer: Self-pay | Admitting: Family Medicine

## 2018-08-26 ENCOUNTER — Encounter: Payer: Self-pay | Admitting: Family Medicine

## 2018-08-26 DIAGNOSIS — E782 Mixed hyperlipidemia: Secondary | ICD-10-CM

## 2018-08-26 DIAGNOSIS — M5432 Sciatica, left side: Secondary | ICD-10-CM

## 2018-08-26 MED ORDER — GABAPENTIN 100 MG PO CAPS
ORAL_CAPSULE | ORAL | 1 refills | Status: DC
Start: 2018-08-26 — End: 2018-09-12

## 2018-08-26 NOTE — Progress Notes (Signed)
Virtual visit completed through WebEx or similar program Patient location: home  Provider location: [place] at [facility], office   Limitations and rationale for visit method d/w patient.  Patient agreed to proceed.   CC: back/leg pain, sciatica.   HPI:  Sciatica.  Prev had injections with relief for about 2-3 weeks with each injection.  She has had 3 injections prev.   She still has pain with sitting and when up walking.  Pain in L leg.  No R sided sx.  Hydrocodone helps enough for her to work.  No ADE on med.  [place] database reviewed, appropriated.  No FCNAVD.  Pain is worse with weather changes.  No foot weakness.   Prev MRI IMPRESSION: L4-5: Minimal disc bulge. 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 No compressive stenosis. Findings at this level could be associated with back pain or referred facet syndrome pain particularly on the left.  L5-S1: Disc bulge more towards the left. Bilateral facet arthropathy worse on the left. Left foraminal narrowing that could possibly affect the exiting L5 nerve. The facet arthropathy could also be a cause of back pain or referred facet syndrome pain particularly on the left.  She is going to try to get another injection after the restrictions are lifted.  She may end up needing surgery in the future.  She is already on gabapentin 300mg  BID.  The addition of gabapentin helped with pain.    She couldn't afford lipitor.  She lost her insurance.    She is working at [employer] and they have had restrictions in place.  Meds and allergies reviewed.   ROS: Per HPI unless specifically indicated in ROS section   NAD Speech wnl  A/P:  HLD. She couldn't afford lipitor.  She lost her insurance.  I'll check with staff about options.   Back pain.  She is going to try to get another injection after the restrictions are lifted.  She may end up needing surgery in the future for better pain control.  She is already on gabapentin 300mg  BID.  The addition  of gabapentin helped with pain.  Reasonable to try increasing gabapentin to 300 mg 3 times a day and then update me about her situation.  [employer] reviewed, appropriate.  Opiate cautions given.  Would continue PRN opiate use for now.

## 2018-08-30 MED ORDER — HYDROCODONE-ACETAMINOPHEN 5-325 MG PO TABS: 1.0000 | ORAL_TABLET | Freq: Four times a day (QID) | ORAL | 0 refills | Status: DC | PRN

## 2018-08-30 NOTE — Assessment & Plan Note (Signed)
She is going to try to get another injection after the restrictions are lifted.  She may end up needing surgery in the future for better pain control.  She is already on gabapentin 300mg  BID.  The addition of gabapentin helped with pain.  Reasonable to try increasing gabapentin to 300 mg 3 times a day and then update me about her situation.  [employer] reviewed, appropriate.  Opiate cautions given.  Would continue PRN opiate use for now.

## 2018-08-30 NOTE — Assessment & Plan Note (Signed)
She couldn't afford lipitor.  She lost her insurance.  I'll check with staff about options.

## 2018-09-07 ENCOUNTER — Telehealth: Payer: Self-pay | Admitting: Family Medicine

## 2018-09-07 NOTE — Telephone Encounter (Signed)
Best number [phone]  Pt called to get a refill on hydrocodone She stated her drug store told her to call office  [facility] [address] Pt has 1 pill left

## 2018-09-08 MED ORDER — HYDROCODONE-ACETAMINOPHEN 5-325 MG PO TABS: 1.0000 | ORAL_TABLET | Freq: Four times a day (QID) | ORAL | 0 refills | Status: DC | PRN

## 2018-09-08 NOTE — Telephone Encounter (Signed)
I checked with pharmacy. Pt last picked up medication on 5/26 #40. Pt will need new rx. Thanks.

## 2018-09-08 NOTE — Telephone Encounter (Signed)
Dr. Damita Dunnings please advise pt would like refill on hydrocodone last refill 5/25 #40 no refills. Last OV 5/21

## 2018-09-08 NOTE — Telephone Encounter (Signed)
I was able to send erx at this point.  Thanks.

## 2018-09-08 NOTE — Telephone Encounter (Signed)
Please check with the pharmacy on this. It was previously sent and filled on 08/22/2018, according to the EMR and the [facility] It was sent again on 08/30/2018.  I do not see where that was filled, according to the [facility]. Additionally, for some reason I am unable to E prescribe medications at this point due to a system failure.  I may have to handwrite this prescription tomorrow.  [facility]  [id number] [place], [place]

## 2018-09-09 ENCOUNTER — Other Ambulatory Visit: Payer: Self-pay | Admitting: Family Medicine

## 2018-09-10 NOTE — Telephone Encounter (Signed)
Refill request from pharmacy on Gabapentin asking for 90 day supply instead of 30 day that was sent in on 08/26/2018. Ok to change or just leave at 30 days for now?

## 2018-09-12 NOTE — Telephone Encounter (Signed)
If she is consistently taking 300mg  TID, then would change to 300mg  tabs.  Med updated, notify pt.  rx sent.  Thanks.

## 2018-09-13 ENCOUNTER — Telehealth: Payer: Self-pay | Admitting: Family Medicine

## 2018-09-13 DIAGNOSIS — M5432 Sciatica, left side: Secondary | ICD-10-CM

## 2018-09-13 MED ORDER — GABAPENTIN 300 MG PO CAPS: 300.0000 mg | ORAL_CAPSULE | Freq: Three times a day (TID) | ORAL | 0 refills | Status: DC | PRN

## 2018-09-13 NOTE — Telephone Encounter (Signed)
An error occurred while processing the e-prescribing message.   The message was not sent electronically to the requested pharmacy. Contact the pharmacy about the approved prescription.   Code: 250 - Receiver unable to process  Prescription no longer active

## 2018-09-13 NOTE — Telephone Encounter (Signed)
I resent the rx.  Thanks.

## 2018-09-13 NOTE — Telephone Encounter (Signed)
I put in the order.  Thanks. 

## 2018-09-13 NOTE — Telephone Encounter (Signed)
Referral sent with records and Appt scheduled for 09/30/18 at 12pm.

## 2018-09-13 NOTE — Telephone Encounter (Signed)
Patient called requesting a call back from the nurse  She stated she needed to speak with her about a prescription and about scheduling with a back doctor.  Did not give details.    PHONE- [phone]

## 2018-09-13 NOTE — Telephone Encounter (Signed)
Patient says she needs to get back in to see Dr. Maryjean Ka (Neurosurgery).  They will not see her until she pays her bill and patient is on her way now to pay it.  Patient was told that an ASAP referral needs to be done to try to get her in today or tomorrow.

## 2018-09-20 ENCOUNTER — Other Ambulatory Visit: Payer: Self-pay | Admitting: Family Medicine

## 2018-09-20 NOTE — Telephone Encounter (Signed)
Name of Medication: hydrocodone apap 5-325 mg Name of [facility]acy[facility] [place] or Written Date and Quantity: #40 on 09/08/18 Last Office Visit and Type: 02/01/18 acute & 01/14/18 FU pain mgt Next Office Visit and Type: none Last Controlled Substance Agreement Date: none Last NJN:GWLT

## 2018-09-20 NOTE — Telephone Encounter (Signed)
Best number [phone] Pt called needing a refill on [facility]codone [facility] [address] She stated they told her to call dr office

## 2018-09-21 MED ORDER — HYDROCODONE-ACETAMINOPHEN 5-325 MG PO TABS: 1.0000 | ORAL_TABLET | Freq: Four times a day (QID) | ORAL | 0 refills | Status: DC | PRN

## 2018-09-21 NOTE — Telephone Encounter (Signed)
Sent. Thanks.  How is she doing with the higher dose of gabapentin?

## 2018-09-21 NOTE — Telephone Encounter (Signed)
Patient says she is doing well with the Gabapentin.  She takes 1 Hydrocodone and 1 Gabapentin and the pain eases tremendously.

## 2018-09-22 NOTE — Telephone Encounter (Signed)
Noted. Thanks.

## 2018-10-04 ENCOUNTER — Other Ambulatory Visit: Payer: Self-pay | Admitting: Family Medicine

## 2018-10-04 NOTE — Telephone Encounter (Signed)
Name of Medication:Hydrocodone apap 5-325 mg  Name of Pharmacy[facility] [place] or Written Date and Quantity:# 40 on 09/21/18 Last Office Visit and Type:08/26/18 FU  Next Office Visit and Type: none scheduled Last Controlled Substance Agreement Date: none Last PJA:SNKN  Pt has one tab left.

## 2018-10-04 NOTE — Telephone Encounter (Signed)
Patient requested refill.   1 tablet left   [facility]one   [facility] [address]     Patient's C/B #  [phone]

## 2018-10-06 MED ORDER — HYDROCODONE-ACETAMINOPHEN 5-325 MG PO TABS: 1.0000 | ORAL_TABLET | Freq: Four times a day (QID) | ORAL | 0 refills | Status: DC | PRN

## 2018-10-06 NOTE — Telephone Encounter (Signed)
Noted. Thanks.

## 2018-10-06 NOTE — Telephone Encounter (Signed)
Sent. Thanks.  I am working on my refill requests.

## 2018-10-06 NOTE — Telephone Encounter (Signed)
Pt called back, she out of medication

## 2018-10-06 NOTE — Telephone Encounter (Signed)
Patient advised and patient said thank you so much

## 2018-10-25 ENCOUNTER — Other Ambulatory Visit: Payer: Self-pay | Admitting: Family Medicine

## 2018-10-25 NOTE — Telephone Encounter (Signed)
Name of Medication: Hydrocodone apap 5-325 mg Name of [facility]acy[facility] [place] or Written Date and Quantity: # 40 on 10/06/18 Last Office Visit and Type: 08/26/18 FU back & leg pain Next Office Visit and Type: none scheduled Last Controlled Substance Agreement Date: none seen Last XUX:YBFX seen

## 2018-10-25 NOTE — Telephone Encounter (Signed)
Patient requesting refill  HYDROcodone  1 tablet left   [facility] [address] [place]    Patient C/B # [id number]

## 2018-10-26 MED ORDER — HYDROCODONE-ACETAMINOPHEN 5-325 MG PO TABS: 1.0000 | ORAL_TABLET | Freq: Four times a day (QID) | ORAL | 0 refills | Status: DC | PRN

## 2018-10-26 NOTE — Telephone Encounter (Signed)
Sent.  Okay to continue.   Need to set up a chronic pain follow-up visit when possible.   I realize that finances are an issue for the patient.  Thanks.

## 2018-10-26 NOTE — Telephone Encounter (Signed)
Left detailed message on voicemail. DPR 

## 2018-12-12 ENCOUNTER — Other Ambulatory Visit: Payer: Self-pay | Admitting: Family Medicine

## 2018-12-14 NOTE — Telephone Encounter (Signed)
Medication: Gabapentin LOV 08/26/2018 follow up No future appointments scheduled. Last filled on 09/13/2018 #270 with 0 refill.

## 2018-12-15 NOTE — Telephone Encounter (Signed)
Sent. Thanks.   

## 2018-12-28 ENCOUNTER — Other Ambulatory Visit: Payer: Self-pay | Admitting: Family Medicine

## 2018-12-28 NOTE — Telephone Encounter (Signed)
Patient stated she received a call back and thought it was in regards to a request she called in yesterday , I did not see anything    Requesting a 7 day supply.  HYDROCODONE  She is completely out and would like it before she goes out of town on Saturday   [facility] [place]

## 2018-12-28 NOTE — Telephone Encounter (Signed)
Name of Medication: Hydrocodone apap 5-325 mg Name of [facility] [place] or Written Date and Quantity:# 40 on 10/26/18  Last Office Visit and Type:08/26/18 for back & leg pain  Next Office Visit and Type: none scheduled Last Controlled Substance Agreement Date: none found Last [id number] found  Pt will be going out of town on 01/01/19 and request 7day supply.Please advise.

## 2018-12-29 MED ORDER — HYDROCODONE-ACETAMINOPHEN 5-325 MG PO TABS: 1.0000 | ORAL_TABLET | Freq: Four times a day (QID) | ORAL | 0 refills | Status: DC | PRN

## 2018-12-29 NOTE — Telephone Encounter (Signed)
I can write it for the same quantity as prev.  rx sent.  If she still having significant pain then she needs to follow-up with the back clinic.  Thanks.

## 2018-12-30 NOTE — Telephone Encounter (Signed)
Left detailed message on voicemail.  

## 2019-02-18 ENCOUNTER — Telehealth: Payer: Self-pay

## 2019-02-18 MED ORDER — HYDROCODONE-ACETAMINOPHEN 5-325 MG PO TABS: 1.0000 | ORAL_TABLET | Freq: Four times a day (QID) | ORAL | 0 refills | Status: DC | PRN

## 2019-02-18 NOTE — Telephone Encounter (Signed)
Patient advised.

## 2019-02-18 NOTE — Telephone Encounter (Signed)
Sent but she needs to f/u with the neurosurgery clinic.  Thanks.

## 2019-02-18 NOTE — Telephone Encounter (Signed)
Patient states she saw Dr Harkin(neurosurgeon) for her back pain and called today to ask for help with her back pain-possible another back injection. Patient states their office said that Dr Lovenia Shuck is not in the office and his nurse is not there today and will not be back until Monday 02/21/2019, no one else at that office can help on their behalf per patient. Patient states she needs Hydrocodone refilled to help with the pain until she can talk to the nurse and get in with the doctor. Patient is out of the medication. Please review. Thank you

## 2019-02-25 ENCOUNTER — Other Ambulatory Visit: Payer: Self-pay | Admitting: Neurosurgery

## 2019-02-25 DIAGNOSIS — I10 Essential (primary) hypertension: Secondary | ICD-10-CM | POA: Insufficient documentation

## 2019-02-25 DIAGNOSIS — M5416 Radiculopathy, lumbar region: Secondary | ICD-10-CM | POA: Insufficient documentation

## 2019-02-25 DIAGNOSIS — M47816 Spondylosis without myelopathy or radiculopathy, lumbar region: Secondary | ICD-10-CM

## 2019-03-02 ENCOUNTER — Inpatient Hospital Stay: Admission: RE | Admit: 2019-03-02 | Payer: Self-pay | Source: Ambulatory Visit

## 2019-03-08 ENCOUNTER — Other Ambulatory Visit: Payer: Self-pay | Admitting: *Deleted

## 2019-03-08 ENCOUNTER — Telehealth: Payer: Self-pay | Admitting: Family Medicine

## 2019-03-08 NOTE — Telephone Encounter (Signed)
Patient states she cannot get in to pain mgmt for a shot until January 4th and is completely out of medication and is in pain.

## 2019-03-08 NOTE — Telephone Encounter (Signed)
Refill request sent to Dr. Duncan. 

## 2019-03-08 NOTE — Telephone Encounter (Signed)
Patient is requesting a refill  She is completely out of medication and in pain    Patient can not get into pain mangement to get a shot until jan 4th    HYDROcodone  Ibuprofen Gabapentin    [facility] [place]

## 2019-03-09 MED ORDER — GABAPENTIN 300 MG PO CAPS: ORAL_CAPSULE | ORAL | 0 refills | Status: DC

## 2019-03-09 MED ORDER — IBUPROFEN 600 MG PO TABS: ORAL_TABLET | ORAL | 2 refills | Status: DC

## 2019-03-09 MED ORDER — HYDROCODONE-ACETAMINOPHEN 5-325 MG PO TABS: 1.0000 | ORAL_TABLET | Freq: Four times a day (QID) | ORAL | 0 refills | Status: DC | PRN

## 2019-03-09 NOTE — Telephone Encounter (Signed)
Sent.  Please have her f/u with the pain clinic when possible in 04/2019.  Thanks.

## 2019-03-10 NOTE — Telephone Encounter (Signed)
[facility] Night - Client TELEPHONE ADVICE RECORD [facility] Patient Name: Julia [name] Gender: Female DOB: 10/30/1965 Age: 53 Y 52 M 27 D Return Phone Number: [id number] (Primary) Address: City/State/Zip: [place] [address] Client [facility] Night - Client Client Site [place] Physician Renford Dills - MD Contact Type Call Who Is Calling Patient / Member / Family / Caregiver Call Type Triage / Clinical Relationship To Patient Self Return Phone Number [phone] (Primary) Chief Complaint Back Pain - General Reason for Call Medication Question / Request Initial Comment [name] is wanting to know why the doctor never called her back about her prescriptions. She is out of her hydrocodone, ibuprofen, and gabapentin. Her back hurts Translation No Nurse Assessment Guidelines Guideline Title Affirmed Question Affirmed Notes Nurse Date/Time (Eastern Time) Disp. Time Eilene Ghazi Time) Disposition Final User 03/09/2019 5:20:29 PM Attempt made - message left Sarajane Marek 03/09/2019 5:32:55 PM Attempt made - message left Sarajane Marek 03/09/2019 5:49:06 PM FINAL ATTEMPT MADE - message left Yes Hassell Done, RN, Mateo Flow Comments User: Katy Apo, RN Date/Time Eilene Ghazi Time): 03/09/2019 5:20:08 PM Got disconnected from patient and left voicemail.

## 2019-03-24 ENCOUNTER — Telehealth: Payer: Self-pay | Admitting: Family Medicine

## 2019-03-24 NOTE — Telephone Encounter (Signed)
Electronic refill request. Macrobid Last office visit:   08/26/2018 Last Filled:   #14 on  01/17/2018 Please advise.

## 2019-03-25 NOTE — Telephone Encounter (Signed)
I need an update from the patient.  If she is having recurrent/new issues then she needs a visit/evaluation.  Thanks.

## 2019-03-25 NOTE — Telephone Encounter (Signed)
Patient received Lugene's message and asking for Lugene to call her back at [phone].

## 2019-03-25 NOTE — Telephone Encounter (Signed)
Patient advised.

## 2019-03-25 NOTE — Telephone Encounter (Signed)
Patient says she has been noticing a bad odor to her urine but no other symptoms, no fever, no back pain or abdominal pain, no urgency, frequency, dysuria.  Patient encouraged to drink lots of water and says she is doing that.  Patient says she cannot come in for an appointment because she has no insurance and has only been working 2 days per week.

## 2019-03-25 NOTE — Telephone Encounter (Signed)
I wouldn't treat if she doesn't have dysuria, even if UA were abnormal for LE/nitrite.  I agree with inc water intake.  If she has new symptoms in spite of drinking enough to keep urine clear/light colored, then let us know.  Thanks.

## 2019-03-25 NOTE — Telephone Encounter (Signed)
Left detailed message on voicemail.  

## 2019-04-12 ENCOUNTER — Telehealth: Payer: Self-pay | Admitting: Family Medicine

## 2019-04-12 NOTE — Telephone Encounter (Signed)
Name of Medication: Hydrocodone apap 5-325 mg Name of [facility]acy[facility] [place] or Written Date and Quantity: #40 on 03/09/2019 Last Office Visit and Type: 08/26/18 FU Next Office Visit and Type: none scheduled Last Controlled Substance Agreement Date: none seen Last [id number] seen  Per Courtney's note pt is completely out of med.

## 2019-04-12 NOTE — Telephone Encounter (Signed)
Patient is requesting a refill  HYDROcodone  She is completely out and took her last dose today  [facility] [address]

## 2019-04-13 MED ORDER — HYDROCODONE-ACETAMINOPHEN 5-325 MG PO TABS: 1.0000 | ORAL_TABLET | Freq: Four times a day (QID) | ORAL | 0 refills | Status: DC | PRN

## 2019-04-13 NOTE — Telephone Encounter (Signed)
Sent.  She is going to need f/u here about her back pain.   She was going to see the pain clinic.  What is her status on that?   Please let me know.  Thanks.

## 2019-04-14 NOTE — Telephone Encounter (Signed)
I had to leave a message on her VM but the last time that I spoke with her about this, she stated she did not have the money to come to an office visit.  I'm assuming that will be the case for the pain clinic also but will await her return call with information.

## 2019-04-15 NOTE — Telephone Encounter (Signed)
Noted. Thanks.

## 2019-06-08 ENCOUNTER — Telehealth: Payer: Self-pay | Admitting: Family Medicine

## 2019-06-08 NOTE — Telephone Encounter (Signed)
Patient called requesting a refill   HYDROcodone    Patient is completely out of medication, took the last tablet last night     [facility] [place]

## 2019-06-08 NOTE — Telephone Encounter (Signed)
I am sympathetic to her situation but we need more lead time on these refills.  I did not refill yet.  Is what is her situation with the back/pain clinic?  She is still going to have to be able to schedule follow-up here at some point in the near future.  Otherwise it would be unreasonable to keep filling her prescriptions.

## 2019-06-08 NOTE — Telephone Encounter (Signed)
Name of Medication: hydrocodone apap 5-325 mg [facility] [place] or Written Date and Quantity#40 on1/6/21  Last Office Visit and Type: 08/26/18 FU Next Office Visit and Type: none schedued Last Controlled Substance Agreement Date   none:  Last UDS: none  Sorry was sent to my destop and I was in class most of the day.

## 2019-06-09 NOTE — Telephone Encounter (Signed)
Left detailed message on voicemail.  

## 2019-06-15 MED ORDER — HYDROCODONE-ACETAMINOPHEN 5-325 MG PO TABS: 1.0000 | ORAL_TABLET | Freq: Four times a day (QID) | ORAL | 0 refills | Status: DC | PRN

## 2019-06-15 NOTE — Telephone Encounter (Signed)
Sent. Reasonable for follow-up in about 1 month.  Thanks.

## 2019-06-15 NOTE — Telephone Encounter (Signed)
Please disregard previous message sent back on patient. It was attached to wrong encounter.    Spoke with patient.   Advised of message below from Dr Damita Dunnings. She stated that she spoke with the pain clinic and because she has no insurance it will cost her $57. And she said she can not afford that.  I asked if she was able to have a follow up here and she stated she will be able to in about a month because she will be starting a new job and hopes she can then afford a visit here.

## 2019-06-15 NOTE — Telephone Encounter (Signed)
Called and left voicemail for patient to call back and get scheduled for a follow up.

## 2019-06-15 NOTE — Telephone Encounter (Signed)
Please call patient and schedule follow up visit in one month as instructed.

## 2019-06-15 NOTE — Telephone Encounter (Signed)
error 

## 2019-06-15 NOTE — Telephone Encounter (Signed)
Noted.  I think follow-up here in about 1 month makes sense.  Thanks.

## 2019-06-15 NOTE — Telephone Encounter (Signed)
Spoke with patient.  She stated that the pharmacy did not receive the prescription at [facility].  Advised of message below from Dr Damita Dunnings. She stated that she spoke with the pain clinic and because she has no insurance it will cost her $60. And she said she can not afford that.  I asked if she was able to have a follow up here and she stated she will be able to in about a month because she will be starting a new job and hopes she can then afford a visit here.

## 2019-06-20 NOTE — Telephone Encounter (Signed)
Patient advised.

## 2019-07-07 ENCOUNTER — Other Ambulatory Visit: Payer: Self-pay | Admitting: Family Medicine

## 2019-07-07 NOTE — Telephone Encounter (Signed)
Electronic refill request. Ibuprofen Last office visit:   08/26/2018 Last Filled:    90 tablet 2 03/09/2019  Please advise.

## 2019-07-08 NOTE — Telephone Encounter (Signed)
Sent. Thanks.   

## 2019-07-18 ENCOUNTER — Ambulatory Visit: Payer: Self-pay | Admitting: Family Medicine

## 2019-07-18 ENCOUNTER — Other Ambulatory Visit: Payer: Self-pay

## 2019-07-18 ENCOUNTER — Encounter: Payer: Self-pay | Admitting: Family Medicine

## 2019-07-18 VITALS — BP 128/72 | HR 70 | Temp 97.1°F | Ht 63.0 in | Wt 154.5 lb

## 2019-07-18 DIAGNOSIS — M5432 Sciatica, left side: Secondary | ICD-10-CM

## 2019-07-18 DIAGNOSIS — G8929 Other chronic pain: Secondary | ICD-10-CM

## 2019-07-18 LAB — BASIC METABOLIC PANEL
BUN: 7 mg/dL (ref 6–23)
CO2: 26 mEq/L (ref 19–32)
Calcium: 9.6 mg/dL (ref 8.4–10.5)
Chloride: 104 mEq/L (ref 96–112)
Creatinine, Ser: 0.64 mg/dL (ref 0.40–1.20)
GFR: 96.34 mL/min (ref >60.00)
Glucose, Bld: 115 mg/dL — ABNORMAL HIGH (ref 70–99)
Potassium: 4.5 mEq/L (ref 3.5–5.1)
Sodium: 137 mEq/L (ref 135–145)

## 2019-07-18 MED ORDER — GABAPENTIN 300 MG PO CAPS: ORAL_CAPSULE | ORAL | Status: DC

## 2019-07-18 NOTE — Patient Instructions (Signed)
Try taking an extra tab of gabapentin.  Sedation caution.  Go to the lab on the way out.   If you have mychart we'll likely use that to update you.    We'll go from there.  Take care.  Glad to see you.

## 2019-07-18 NOTE — Progress Notes (Signed)
This visit occurred during the SARS-CoV-2 public health emergency.  Safety protocols were in place, including screening questions prior to the visit, additional usage of staff PPE, and extensive cleaning of exam room while observing appropriate contact time as indicated for disinfecting solutions.  covid cautions d/w pt.    Indication for chronic opioid: sciatica Medication and dose: hydrocodone 5/325mg  # pills per month: 40.   Date narcotic database last reviewed (include red flags): 07/18/19   She has been taking 2400mg  ibuprofen daily for pain.   We talked about recheck Cr today given NSAID use.    Still on gabapentin at baseline.  Not drowsy with use.    She has occ MJ use but not other illicits, d/w pt.    Needle sensation in the posterior left leg, similar to prev  Pain inventory (1-10) Average pain: clearly better sitting, 4/10.   Pain now: 4/10 My pain is constant, clearly worse standing.   Pain is worse with standing.  Relief from meds: yes  In the last 24 hours, how much has pain interfered with the following (1-10 greatest interference)? General activity- some limitation, couldn't ride motorcycle.   Relationships with others- none Enjoyment of life- "I push through it."   What time of the day is the pain the worst- midmorning, when medicine wears off.  Sleep is described by patient as "I don't, with insomnia at baseline."   Mobility/function Assistance device: no How many minutes can you walk: as needed, "a ways" with pain.   Able to climb steps:no Driving:no Disabled:no  Bowel or bladder symptoms:no Mood: no SI/HI.    Physicians involved in care:  Prev injection done at spine clinic, with relief, but with effect wearing off after a few months.     Any changes since last visit? no  Meds, vitals, and allergies reviewed.  ROS: Per HPI unless specifically indicated in ROS section   GEN: nad, alert and oriented HEENT: ncat NECK: supple w/o LA CV: rrr PULM:  ctab, no inc wob ABD: soft, +bs EXT: no edema SKIN: Well-perfused. Left lower back tender to palpation with limp on gait from left leg pain.

## 2019-07-21 DIAGNOSIS — G8929 Other chronic pain: Secondary | ICD-10-CM | POA: Insufficient documentation

## 2019-07-21 MED ORDER — IBUPROFEN 600 MG PO TABS: 600.0000 mg | ORAL_TABLET | Freq: Three times a day (TID) | ORAL | 2 refills | Status: DC | PRN

## 2019-07-21 MED ORDER — HYDROCODONE-ACETAMINOPHEN 5-325 MG PO TABS: 1.0000 | ORAL_TABLET | Freq: Four times a day (QID) | ORAL | 0 refills | Status: DC | PRN

## 2019-07-21 NOTE — Assessment & Plan Note (Signed)
Indication for chronic opioid: sciatica Medication and dose: hydrocodone 5/325mg  # pills per month: 40.   Date narcotic database last reviewed (include red flags): 07/18/19   Recheck labs today.  Creatinine is still normal.  Ibuprofen refill sent.  Reasonable to try increasing gabapentin by 300 mg/day and she will update me with that.  Continue as needed hydrocodone in the meantime with routine cautions.  She agrees.

## 2019-08-04 ENCOUNTER — Other Ambulatory Visit: Payer: Self-pay

## 2019-08-04 ENCOUNTER — Telehealth: Payer: Self-pay

## 2019-08-04 ENCOUNTER — Encounter: Payer: Self-pay | Admitting: Internal Medicine

## 2019-08-04 ENCOUNTER — Ambulatory Visit: Payer: Self-pay | Admitting: Internal Medicine

## 2019-08-04 VITALS — BP 124/76 | HR 83 | Temp 97.0°F | Wt 155.0 lb

## 2019-08-04 DIAGNOSIS — N6320 Unspecified lump in the left breast, unspecified quadrant: Secondary | ICD-10-CM

## 2019-08-04 NOTE — Progress Notes (Signed)
Subjective:    Patient ID: Julia [name], female    DOB: [birth year], 54 y.o.   MRN: [id number]  HPI  Pt presents to the clinic today with c/o a lump in her left breast. She noticed it this morning. It is slightly tender. She does not normally do self breast exams. She has not had a mammogram since 2013. She reports her aunt just had a double mastectomy secondary to breast cancer.  Review of Systems  Past Medical History:  Diagnosis Date  . GERD (gastroesophageal reflux disease)   . Smoking     Current Outpatient Medications  Medication Sig Dispense Refill  . albuterol (PROVENTIL HFA;VENTOLIN HFA) 108 (90 Base) MCG/ACT inhaler Inhale 1-2 puffs into the lungs every 6 (six) hours as needed for wheezing or shortness of breath (or for cough). 1 Inhaler 2  . atorvastatin (LIPITOR) 10 MG tablet TAKE 1 TABLET BY MOUTH EVERY DAY (Patient not taking: Reported on 08/26/2018) 90 tablet 0  . cyclobenzaprine (FLEXERIL) 10 MG tablet TAKE 1/2 TO 1 TABLET 3 TIMES A DAY AS NEEDED FOR MUSCLE SPASM OR FOR HEADACHE 30 tablet 1  . diphenhydrAMINE (BENADRYL) 25 mg capsule Take 1 capsule (25 mg total) by mouth every 8 (eight) hours as needed for allergies.    Marland Kitchen gabapentin (NEURONTIN) 300 MG capsule Take up to 4 tabs a day (2 tabs twice a day)    . HYDROcodone-acetaminophen (NORCO) 5-325 MG tablet Take 1-2 tablets by mouth every 6 (six) hours as needed for severe pain. Sedation caution 40 tablet 0  . ibuprofen (ADVIL) 600 MG tablet Take 1 tablet (600 mg total) by mouth every 8 (eight) hours as needed (For pain.  Take with food.). 90 tablet 2   No current facility-administered medications for this visit.    Allergies  Allergen Reactions  . Propoxyphene N-Acetaminophen Hives  . Nabumetone Rash    She can tolerate ibuprofen w/o difficulty  . Rofecoxib Rash    nightmares    Family History  Problem Relation Age of Onset  . Hypertension Mother   . Diabetes Mother        insulin dependent  . Heart  disease Mother   . Diabetes Father   . Heart disease Father        CAD  . Hypertension Brother   . Kidney disease Brother        kidney failure/resolved  . Colon cancer Maternal Aunt        dx'd at ~62  . Breast cancer Neg Hx     Social History   Socioeconomic History  . Marital status: Married    Spouse name: Not on file  . Number of children: 1  . Years of education: Not on file  . Highest education level: Not on file  Occupational History  . Occupation: Scientist, water quality at [employer]  . Smoking status: Current Every Day Smoker    Packs/day: 1.50    Years: 31.00    Pack years: 46.50    Types: Cigarettes  . Smokeless tobacco: Never Used  Substance and Sexual Activity  . Alcohol use: No    Alcohol/week: 0.0 standard drinks  . Drug use: No  . Sexual activity: Not on file  Other Topics Concern  . Not on file  Social History Narrative   One son   Married 2000 (had been together since 51)   Social Determinants of Radio broadcast assistant Strain:   . Difficulty of Paying Living  Expenses:   Food Insecurity:   . Worried About Charity fundraiser in the Last Year:   . Arboriculturist in the Last Year:   Transportation Needs:   . Film/video editor (Medical):   Marland Kitchen Lack of Transportation (Non-Medical):   Physical Activity:   . Days of Exercise per Week:   . Minutes of Exercise per Session:   Stress:   . Feeling of Stress :   Social Connections:   . Frequency of Communication with Friends and Family:   . Frequency of Social Gatherings with Friends and Family:   . Attends Religious Services:   . Active Member of Clubs or Organizations:   . Attends Archivist Meetings:   Marland Kitchen Marital Status:   Intimate Partner Violence:   . Fear of Current or Ex-Partner:   . Emotionally Abused:   Marland Kitchen Physically Abused:   . Sexually Abused:      Constitutional: Denies fever, malaise, fatigue, headache or abrupt weight changes.  Respiratory: Denies  difficulty breathing, shortness of breath, cough or sputum production.   Cardiovascular: Denies chest pain, chest tightness, palpitations or swelling in the hands or feet.  Skin: Pt reports left breast mass. Denies redness, rashes, lesions or ulcercations.   No other specific complaints in a complete review of systems (except as listed in HPI above).     Objective:   Physical Exam   BP 124/76   Pulse 83   Temp (!) 97 F (36.1 C) (Temporal)   Wt 155 lb (70.3 kg)   SpO2 97%   BMI 27.46 kg/m   Wt Readings from Last 3 Encounters:  07/18/19 154 lb 8 oz (70.1 kg)  02/01/18 157 lb (71.2 kg)  01/14/18 160 lb (72.6 kg)    General: Appears herstated age, well developed, well nourished in NAD. Skin: Breast symmetrical. Nipples slightly recessed bilaterally. 3 x 3 cm irregularly shaped hard mass noted at 3 oclock. No axillary lymphadenopathy. Unable to express discharge from the nipple.  Cardiovascular: Normal rate and rhythm.  Pulmonary/Chest: Normal effort and positive vesicular breath sounds. No respiratory distress. No wheezes, rales or ronchi noted.  Neurological: Alert and oriented.     BMET    Component Value Date/Time   NA 137 07/18/2019 0954   K 4.5 07/18/2019 0954   CL 104 07/18/2019 0954   CO2 26 07/18/2019 0954   GLUCOSE 115 (H) 07/18/2019 0954   BUN 7 07/18/2019 0954   CREATININE 0.64 07/18/2019 0954   CALCIUM 9.6 07/18/2019 0954   GFRNONAA >60 07/03/2016 2226   GFRAA >60 07/03/2016 2226    Lipid Panel     Component Value Date/Time   CHOL 228 (H) 12/29/2014 0856   TRIG (H) 12/29/2014 0856    557.0 Triglyceride is over 400; calculations on Lipids are invalid.   HDL 29.80 (L) 12/29/2014 0856   CHOLHDL 8 12/29/2014 0856   VLDL 49.8 (H) 05/07/2011 0939    CBC    Component Value Date/Time   WBC 9.5 04/13/2017 1540   RBC 4.31 04/13/2017 1540   HGB 14.4 04/13/2017 1540   HCT 40.4 04/13/2017 1540   PLT 234.0 04/13/2017 1540   MCV 93.8 04/13/2017 1540   MCH  32.9 07/03/2016 2226   MCHC 35.5 04/13/2017 1540   RDW 12.9 04/13/2017 1540   LYMPHSABS 3.3 04/13/2017 1540   MONOABS 0.7 04/13/2017 1540   EOSABS 0.4 04/13/2017 1540   BASOSABS 0.1 04/13/2017 1540    Hgb A1C Lab Results  Component Value Date   HGBA1C 5.5 12/15/2007           Assessment & Plan:   Left Breast Mass:  Will obtain diagnostic mammogram and ultrasound of the left breast- concerning for malingnancy  Will call you to set up imaging, return precautions discussed  This visit occurred during the SARS-CoV-2 public health emergency.  Safety protocols were in place, including screening questions prior to the visit, additional usage of staff PPE, and extensive cleaning of exam room while observing appropriate contact time as indicated for disinfecting solutions.   Webb Silversmith, NP

## 2019-08-04 NOTE — Patient Instructions (Signed)
Ductal Carcinoma In Situ  Ductal carcinoma in situ is the presence of abnormal cells in the breast. It is the earliest form of breast cancer. The abnormal cells are located only in the tubes that carry milk to the nipple (milk ducts) and have not spread to other areas. What are the causes? The exact cause of ductal carcinoma in situ is not known. What increases the risk? The following factors increase the risk of developing ductal carcinoma in situ:  Being older than 55 years of age.  Being female.  Having a family history of breast cancer.  Current or past hormone use, such as: ? Using birth control. ? Taking hormone therapy after menopause.  Starting menopause after age 42.  A personal history of: ? Breast cancer. ? Dense breasts. ? Radiation treatments to the breasts or chest area. ? Having the BRCA1 and BRCA2 genes.  Drinking more than 1 alcoholic beverage a day.  Starting your menstrual periods before age 66.  Having never been pregnant or having your first child after age 3.  Having never breastfed.  Having an inactive (sedentary) lifestyle.  Exposure to the drug DES, which was given to pregnant women from the 1940s to the 1970s. What are the signs or symptoms? Ductal carcinoma in situ does not cause any symptoms. How is this diagnosed? Ductal carcinoma in situ is usually discovered during a routine X-ray of the breasts to check for abnormal changes (mammogram). To diagnose the condition, your health care provider may do an ultrasound and remove a tissue sample from your breast so it can be examined under a microscope (breast biopsy). Your health care provider may also remove one or more lymph nodes from under your arm to check if the abnormal cells have spread to your lymph nodes (sentinel lymph node biopsy). Lymph nodes are part of the body's disease-fighting (immune) system. They are located throughout the body. The lymph nodes under the arms are usually the first  place where abnormal cells spread. How is this treated? Ductal carcinoma in situ treatment may include:  A lumpectomy. This is surgery to remove the area of abnormal cells, along with a ring of normal tissue. This may also be called breast-conserving surgery.  Simple mastectomy. This is surgery to remove breast tissue, the nipple, and the circle of colored tissue around the nipple (areola). Sometimes, one or more lymph nodes from under the arm are also removed and tested for cancer cells.  Preventive mastectomy. This is the removal of both breasts. This is usually done only if you have a very high risk of developing breast cancer.  Radiation. This is the use of high-energy rays to kill cancer cells.  Medicines (hormone therapy) to keep the abnormal cells from spreading. Follow these instructions at home:  Take over-the-counter and prescription medicines only as told by your health care provider.  Eat a healthy diet. A healthy diet includes lots of fruits and vegetables, low-fat dairy products, lean meats, and fiber. ? Make sure half your plate is filled with fruits or vegetables. ? Choose high-fiber foods such as whole-grain breads and cereals.  Limit alcohol intake to no more than 1 drink a day for women (no drinks if you are pregnant) and 2 drinks a day for men. One drink equals 12 oz of beer, 5 oz of wine, or 1 oz of hard liquor.  Do not use any products that contain nicotine or tobacco, such as cigarettes and e-cigarettes. If you need help quitting, ask your health  care provider.  Keep all follow-up visits as told by your health care provider. This is important. Where to find more information  American Cancer Society: www.cancer.[place]: www.cancer.gov Contact a health care provider if:  You have a fever.  You notice a new lump in either breast or under your arm.  You have any symptoms or changes that concern you. Get help right away if:  You have  chest pain or trouble breathing. Summary  Ductal carcinoma in situ is the presence of abnormal cells in the breast. It is the earliest form of breast cancer.  The exact cause of ductal carcinoma in situ is not known. The risk increases with age and with current or past hormone use.  To diagnose the condition, your health care provider will remove a tissue sample from your breast so it can be examined under a microscope (breast biopsy). This information is not intended to replace advice given to you by your health care provider. Make sure you discuss any questions you have with your health care provider. Document Revised: 03/06/2017 Document Reviewed: 12/30/2016 Elsevier Patient Education  2020 [employer].

## 2019-08-04 NOTE — Telephone Encounter (Signed)
Pt was being seen by Webb Silversmith today and she asked me about whether she would be able to get her Norco refilled while Dr Damita Dunnings was out of the office.... after viewing chart I saw that there was a refill sent 07/21/2019... I asked pt if she had picked up that Rx and she stated she did not pick it up and was never notified by the pharmacy that it was available...  I called [facility] [address] and was told that pt picked up the Rx for Norco on 07/22/2019 and did not want to pick up the ibuprofen Rx  Just FYI

## 2019-08-04 NOTE — Telephone Encounter (Signed)
Noted.  This appears early to refill.  Thanks.

## 2019-08-05 ENCOUNTER — Encounter: Payer: Self-pay | Admitting: Family Medicine

## 2019-08-05 ENCOUNTER — Telehealth: Payer: Self-pay

## 2019-08-05 ENCOUNTER — Other Ambulatory Visit: Payer: Self-pay | Admitting: Family Medicine

## 2019-08-05 NOTE — Telephone Encounter (Signed)
FYI:  Pt w/no insurance.  She has appt w/[facility] 08-23-19.

## 2019-08-05 NOTE — Telephone Encounter (Signed)
Patient saw [facility] yesterday and stated that she did not pick up the last Hydrocodone Rx that you sent.  Melanie called the pharmacy and she did pick it up but did not get the IBP.

## 2019-08-05 NOTE — Telephone Encounter (Signed)
noted 

## 2019-08-05 NOTE — Telephone Encounter (Signed)
Electronic refill request. Norco Last office visit:   08/04/2019 Garnette Gunner Last Filled:     40 tablet 0 07/21/2019  Please advise.  Please see phone note and note on prior patient request.

## 2019-08-07 NOTE — Telephone Encounter (Signed)
Noted. I didn't refill now.  She should check her rx at home.  See other notes on refill request.  Thanks.

## 2019-08-07 NOTE — Telephone Encounter (Signed)
This is early to refill.  I didn't refill it yet.  She had prev gotten ~one rx per month.  Per PDMP, she had rx filled 07/21/19.  See below.    If her situation is dramatically different re: pain and pain med use, then this needs to go through triage.  Thanks.   Fill Date ID   Written Drug Qty Days Prescriber Rx # Pharmacy Refill   Daily Dose* Pymt Type PMP    07/21/2019  1   07/21/2019  Hydrocodone-Acetamin 5-325 MG  40.00  5 Gr Dun   [id number]   Nor ([id number])   0  40.00 MME  Private Pay   Julia [name]

## 2019-08-10 ENCOUNTER — Telehealth: Payer: Self-pay | Admitting: Internal Medicine

## 2019-08-10 ENCOUNTER — Other Ambulatory Visit: Payer: Self-pay | Admitting: Internal Medicine

## 2019-08-10 DIAGNOSIS — N6320 Unspecified lump in the left breast, unspecified quadrant: Secondary | ICD-10-CM

## 2019-08-10 NOTE — Telephone Encounter (Signed)
Charmaine- can you take care of this. Let me know if I need to cancel and put in orders.

## 2019-08-10 NOTE — Telephone Encounter (Signed)
Patient called.  Patient said she saw Rollene Fare for a lump in her breast.  Rollene Fare referred her to a clinic to have it checked.  Patient said the clinic can't see her until 08/23/19.  Patient said she's not sleeping or eating because she's so upset about the lump.  Patient said her husband told her he would pay for her to have it checked at [place].  Patient's requesting a referral to [place]. Patient can go anytime.

## 2019-08-11 NOTE — Telephone Encounter (Signed)
Pt's appts have been moved up.  Pt [facility] appt moved to 08-17-19 and [email or web address] moved to 08-18-19.  Pt aware.

## 2019-08-12 ENCOUNTER — Inpatient Hospital Stay
Admission: RE | Admit: 2019-08-12 | Discharge: 2019-08-12 | Disposition: A | Discharge status: Home / Self Care | Payer: Self-pay | Source: Ambulatory Visit | Attending: *Deleted | Admitting: *Deleted

## 2019-08-12 ENCOUNTER — Other Ambulatory Visit: Payer: Self-pay | Admitting: *Deleted

## 2019-08-12 DIAGNOSIS — Z1231 Encounter for screening mammogram for malignant neoplasm of breast: Secondary | ICD-10-CM

## 2019-08-12 NOTE — Telephone Encounter (Signed)
Thanks

## 2019-08-12 NOTE — Telephone Encounter (Signed)
Patient called back about refill on Hydrocodone   She stated she has 1 tablet left .     [facility] [address] [address]

## 2019-08-15 MED ORDER — HYDROCODONE-ACETAMINOPHEN 5-325 MG PO TABS: 1.0000 | ORAL_TABLET | Freq: Four times a day (QID) | ORAL | 0 refills | Status: DC | PRN

## 2019-08-15 NOTE — Telephone Encounter (Signed)
Patient states that the place in her breast has been hurting a lot and she doubled up on her IBP but it didn't help.  The situation that she was originally taking the Hydrocodone for is the same, no worse.

## 2019-08-15 NOTE — Addendum Note (Signed)
Addended by: Tonia Ghent on: 08/15/2019 05:55 PM   Modules accepted: Orders

## 2019-08-15 NOTE — Telephone Encounter (Signed)
This asked for an early refill.  I didn't refill it yet.  She had prev gotten ~one rx per month.  Per PDMP, she had rx filled 07/21/19.  See below.    If her situation is dramatically different re: pain and pain med use, then this needs to go through triage.  Thanks.   Fill Date ID   Written Drug Qty Days Prescriber Rx # Pharmacy Refill   Daily Dose* Pymt Type PMP    07/21/2019  1   07/21/2019  Hydrocodone-Acetamin 5-325 MG  40.00  5 Gr Dun   [id number]   Nor ([id number])   0  40.00 MME  Private Pay   [place]   ===== Please update me about her situation.  Thanks.

## 2019-08-15 NOTE — Telephone Encounter (Addendum)
Noted.  Sent.  Thanks.  Will await mammogram report.

## 2019-08-17 ENCOUNTER — Encounter: Payer: Self-pay | Admitting: *Deleted

## 2019-08-17 ENCOUNTER — Other Ambulatory Visit: Payer: Self-pay

## 2019-08-17 ENCOUNTER — Ambulatory Visit: Payer: Self-pay | Attending: Oncology | Admitting: *Deleted

## 2019-08-17 VITALS — BP 112/62 | HR 70 | Temp 96.2°F | Ht 60.0 in | Wt 159.0 lb

## 2019-08-17 DIAGNOSIS — N63 Unspecified lump in unspecified breast: Secondary | ICD-10-CM

## 2019-08-17 NOTE — Progress Notes (Signed)
  Subjective:     Patient ID: Julia [name], female   DOB: 10/21/1965, 54 y.o.   MRN: [id number]  HPI   Review of Systems     Objective:   Physical Exam Chest:     Breasts: Breasts are symmetrical.        Right: No swelling, bleeding, inverted nipple, mass, nipple discharge, skin change or tenderness.        Left: Mass and tenderness present. No swelling, bleeding, inverted nipple, nipple discharge or skin change.    Lymphadenopathy:     Upper Body:     Right upper body: No supraclavicular or axillary adenopathy.     Left upper body: No supraclavicular or axillary adenopathy.        Assessment:     54 year old White female presents to [facility] for complaints of a left breast mass.  States she found the mass on August 03, 2019.  States it is very painful to touch or when she moves her arm at work.  Rates pain a 10 on a scale of 0/10.  On clinical breast exam I can palpate a very large 4 cm mass at 2-3:00 left breast.  It is very tender to palpation.  I can also palpate a small 1 cm mass at 6:00 left breast that is nontender.  Taught self breast awareness.  Patient would like to get her pap smear at a later time.  I will have Christy schedule her pap smear for a later date.  Patient has been screened for eligibility.  She does not have any insurance, Medicare or Medicaid.  She also meets financial eligibility.   Risk Assessment    Risk Scores      08/17/2019   Last edited by: Theodore Demark, RN   5-year risk: 0.8 %   Lifetime risk: 6 %            Plan:       Bilateral mammogram and ultrasound ordered.  Will follow up per [facility] protocol.

## 2019-08-17 NOTE — Progress Notes (Signed)
  Subjective:     Patient ID: Julia [name], female   DOB: 1965/10/27, 54 y.o.   MRN: [id number]  HPI   Review of Systems     Objective:   Physical Exam     Assessment:     See note    Plan:     See notes

## 2019-08-17 NOTE — Patient Instructions (Signed)
Gave patient hand-out, Women Staying Healthy, Active and Well from [facility], with education on breast health, pap smears, heart and colon health. 

## 2019-08-18 ENCOUNTER — Ambulatory Visit
Admission: RE | Admit: 2019-08-18 | Discharge: 2019-08-18 | Disposition: A | Discharge status: Home / Self Care | Payer: Medicaid Other | Source: Ambulatory Visit | Attending: Oncology | Admitting: Oncology

## 2019-08-18 DIAGNOSIS — N6321 Unspecified lump in the left breast, upper outer quadrant: Secondary | ICD-10-CM | POA: Insufficient documentation

## 2019-08-18 DIAGNOSIS — N63 Unspecified lump in unspecified breast: Secondary | ICD-10-CM | POA: Insufficient documentation

## 2019-08-18 DIAGNOSIS — N6320 Unspecified lump in the left breast, unspecified quadrant: Secondary | ICD-10-CM | POA: Diagnosis present

## 2019-08-19 ENCOUNTER — Other Ambulatory Visit: Payer: Self-pay | Admitting: *Deleted

## 2019-08-19 DIAGNOSIS — N63 Unspecified lump in unspecified breast: Secondary | ICD-10-CM

## 2019-08-23 ENCOUNTER — Ambulatory Visit: Payer: Self-pay

## 2019-08-25 ENCOUNTER — Ambulatory Visit
Admission: RE | Admit: 2019-08-25 | Discharge: 2019-08-25 | Disposition: A | Discharge status: Home / Self Care | Payer: Self-pay | Source: Ambulatory Visit | Attending: Oncology | Admitting: Oncology

## 2019-08-25 DIAGNOSIS — C50412 Malignant neoplasm of upper-outer quadrant of left female breast: Secondary | ICD-10-CM | POA: Diagnosis not present

## 2019-08-25 DIAGNOSIS — N63 Unspecified lump in unspecified breast: Secondary | ICD-10-CM

## 2019-08-29 ENCOUNTER — Encounter: Payer: Self-pay | Admitting: *Deleted

## 2019-08-29 ENCOUNTER — Telehealth: Payer: Self-pay

## 2019-08-29 ENCOUNTER — Other Ambulatory Visit: Payer: Self-pay | Admitting: *Deleted

## 2019-08-29 DIAGNOSIS — C50412 Malignant neoplasm of upper-outer quadrant of left female breast: Secondary | ICD-10-CM

## 2019-08-29 NOTE — Telephone Encounter (Signed)
Told Julia [name] that Dr. Denman George is a surgical gyn oncology specialist.  She does not do breast surgry. Pt verbalized understanding.

## 2019-08-30 ENCOUNTER — Encounter: Payer: Self-pay | Admitting: *Deleted

## 2019-08-30 NOTE — Progress Notes (Signed)
Patient came in today to complete her [facility] Medicaid forms.  We discussed her new diagnosis of breast cancer yesterday, and she wishes to be treated in [place].  I have her scheduled to see Dr. Lindi Adie on 4/27, and Dr. [name] for surgical consult on 08/03/19.  Patient was given both appointment dates.  Gave patient breast cancer educational literature, "My Breast Cancer Treatment Handbook" by Josephine Igo, RN.  She is to call with any questions or needs.

## 2019-08-31 NOTE — Progress Notes (Signed)
[facility] CONSULT NOTE  Patient Care Team: Tonia Ghent, MD as PCP - General (Family Medicine) [name], RN as Registered Nurse [name], RN as Registered Nurse  CHIEF COMPLAINTS/PURPOSE OF CONSULTATION:  Newly diagnosed breast cancer  HISTORY OF PRESENTING ILLNESS:  Julia [name] 54 y.o. female is here because of recent diagnosis of invasive mammary carcinoma of the left breast. Patient palpated a left breast mass x1 month. Diagnostic mammogram and [place] on 08/18/19 showed a 2.8cm mass at the 2 o'clock position in the upper-outer left breast, and one lymph node with mild cortical thickening in the left axilla. Biopsy on 08/25/19 showed invasive mammary carcinoma in the breast, grade 3, HER-2 equivocal by IHC (2+), ER+ >90%, PR+ >90%, left axilla negative. She presents to the clinic today for initial evaluation and discussion of treatment options.   I reviewed her records extensively and collaborated the history with the patient.  SUMMARY OF ONCOLOGIC HISTORY: Oncology History  Malignant neoplasm of upper-outer quadrant of left breast in female, estrogen receptor positive ([place])  08/25/2019 Initial Diagnosis   Palpable left breast mass x1 month. Diagnostic mammogram and [place] on 08/18/19 showed a 2.8cm mass at the 2 o'clock position, and one lymph node with mild cortical thickening in the left axilla. Biopsy on 08/25/19 showed invasive mammary carcinoma in the breast, grade 3, HER-2 equivocal by IHC (2+), ER+ >90%, PR+ >90%, left axilla negative     MEDICAL HISTORY:  Past Medical History:  Diagnosis Date  . GERD (gastroesophageal reflux disease)   . Smoking     SURGICAL HISTORY: Past Surgical History:  Procedure Laterality Date  . CHOLECYSTECTOMY    . VULVECTOMY N/A 01/30/2015   Procedure: WIDE LOCAL EXCISION VULVA;  Surgeon: Everitt Amber, MD;  Location: [facility];  Service: Gynecology;  Laterality: N/A;    SOCIAL HISTORY: Social History    Socioeconomic History  . Marital status: Married    Spouse name: Not on file  . Number of children: 1  . Years of education: Not on file  . Highest education level: Not on file  Occupational History  . Occupation: Scientist, water quality at [employer]  . Smoking status: Current Every Day Smoker    Packs/day: 1.50    Years: 31.00    Pack years: 46.50    Types: Cigarettes  . Smokeless tobacco: Never Used  Substance and Sexual Activity  . Alcohol use: No    Alcohol/week: 0.0 standard drinks  . Drug use: No  . Sexual activity: Not on file  Other Topics Concern  . Not on file  Social History Narrative   One son   Married 2000 (had been together since 22)   Social Determinants of Radio broadcast assistant Strain:   . Difficulty of Paying Living Expenses:   Food Insecurity:   . Worried About Charity fundraiser in the Last Year:   . Arboriculturist in the Last Year:   Transportation Needs:   . Film/video editor (Medical):   Marland Kitchen Lack of Transportation (Non-Medical):   Physical Activity:   . Days of Exercise per Week:   . Minutes of Exercise per Session:   Stress:   . Feeling of Stress :   Social Connections:   . Frequency of Communication with Friends and Family:   . Frequency of Social Gatherings with Friends and Family:   . Attends Religious Services:   . Active Member of Clubs or Organizations:   .  Attends Archivist Meetings:   Marland Kitchen Marital Status:   Intimate Partner Violence:   . Fear of Current or Ex-Partner:   . Emotionally Abused:   Marland Kitchen Physically Abused:   . Sexually Abused:     FAMILY HISTORY: Family History  Problem Relation Age of Onset  . Hypertension Mother   . Diabetes Mother        insulin dependent  . Heart disease Mother   . Diabetes Father   . Heart disease Father        CAD  . Hypertension Brother   . Kidney disease Brother        kidney failure/resolved  . Colon cancer Maternal Aunt        dx'd at ~62  . Breast  cancer Maternal Aunt 65    ALLERGIES:  is allergic to propoxyphene n-acetaminophen; nabumetone; and rofecoxib.  MEDICATIONS:  Current Outpatient Medications  Medication Sig Dispense Refill  . albuterol (PROVENTIL HFA;VENTOLIN HFA) 108 (90 Base) MCG/ACT inhaler Inhale 1-2 puffs into the lungs every 6 (six) hours as needed for wheezing or shortness of breath (or for cough). 1 Inhaler 2  . atorvastatin (LIPITOR) 10 MG tablet TAKE 1 TABLET BY MOUTH EVERY DAY 90 tablet 0  . cyclobenzaprine (FLEXERIL) 10 MG tablet TAKE 1/2 TO 1 TABLET 3 TIMES A DAY AS NEEDED FOR MUSCLE SPASM OR FOR HEADACHE 30 tablet 1  . diphenhydrAMINE (BENADRYL) 25 mg capsule Take 1 capsule (25 mg total) by mouth every 8 (eight) hours as needed for allergies.    Marland Kitchen gabapentin (NEURONTIN) 300 MG capsule Take up to 4 tabs a day (2 tabs twice a day)    . HYDROcodone-acetaminophen (NORCO) 5-325 MG tablet Take 1-2 tablets by mouth every 6 (six) hours as needed for severe pain. Sedation caution 40 tablet 0  . ibuprofen (ADVIL) 600 MG tablet Take 1 tablet (600 mg total) by mouth every 8 (eight) hours as needed (For pain.  Take with food.). 90 tablet 2   No current facility-administered medications for this visit.    REVIEW OF SYSTEMS:   Constitutional: Denies fevers, chills or abnormal night sweats Eyes: Denies blurriness of vision, double vision or watery eyes Ears, nose, mouth, throat, and face: Denies mucositis or sore throat Respiratory: Denies cough, dyspnea or wheezes Cardiovascular: Denies palpitation, chest discomfort or lower extremity swelling Gastrointestinal:  Denies nausea, heartburn or change in bowel habits Skin: Denies abnormal skin rashes Lymphatics: Denies new lymphadenopathy or easy bruising Neurological:Denies numbness, tingling or new weaknesses Behavioral/Psych: Mood is stable, no new changes  Breast: Palpable left breast mass All other systems were reviewed with the patient and are negative.  PHYSICAL  EXAMINATION: ECOG PERFORMANCE STATUS: 2 - Symptomatic, <50% confined to bed  Vitals:   09/01/19 1554  BP: 126/73  Pulse: 93  Resp: 17  Temp: 99.1 F (37.3 C)  SpO2: 98%   Filed Weights   09/01/19 1554  Weight: 157 lb (71.2 kg)    GENERAL:alert, no distress and comfortable SKIN: skin color, texture, turgor are normal, no rashes or significant lesions EYES: normal, conjunctiva are pink and non-injected, sclera clear OROPHARYNX:no exudate, no erythema and lips, buccal mucosa, and tongue normal  NECK: supple, thyroid normal size, non-tender, without nodularity LYMPH:  no palpable lymphadenopathy in the cervical, axillary or inguinal LUNGS: clear to auscultation and percussion with normal breathing effort HEART: regular rate & rhythm and no murmurs and no lower extremity edema ABDOMEN:abdomen soft, non-tender and normal bowel sounds Musculoskeletal:no cyanosis of  digits and no clubbing  PSYCH: alert & oriented x 3 with fluent speech NEURO: no focal motor/sensory deficits BREAST: Palpable lump in the left breast.. No palpable axillary or supraclavicular lymphadenopathy (exam performed in the presence of a chaperone)   LABORATORY DATA:  I have reviewed the data as listed Lab Results  Component Value Date   WBC 9.5 04/13/2017   HGB 14.4 04/13/2017   HCT 40.4 04/13/2017   MCV 93.8 04/13/2017   PLT 234.0 04/13/2017   Lab Results  Component Value Date   NA 137 07/18/2019   K 4.5 07/18/2019   CL 104 07/18/2019   CO2 26 07/18/2019    RADIOGRAPHIC STUDIES: I have personally reviewed the radiological reports and agreed with the findings in the report.  ASSESSMENT AND PLAN:  Malignant neoplasm of upper-outer quadrant of left breast in female, estrogen receptor positive ([place]) 08/25/2019:Palpable left breast mass x1 month. Diagnostic mammogram and [place] on 08/18/19 showed a 2.8cm mass at the 2 o'clock position, and one lymph node with mild cortical thickening in the left axilla. Biopsy  on 08/25/19 showed invasive mammary carcinoma in the breast, grade 3, HER-2 equivocal by IHC (2+), ER+ >90%, PR+ >90%, left axilla negative T2N0 stage Ia clinical stage  Pathology and radiology counseling:Discussed with the patient, the details of pathology including the type of breast cancer,the clinical staging, the significance of ER, PR and HER-2/neu receptors and the implications for treatment. After reviewing the pathology in detail, we proceeded to discuss the different treatment options between surgery, radiation, chemotherapy, antiestrogen therapies.  Recommendations: 1. Breast conserving surgery with sentinel lymph node biopsy followed by 2. Oncotype DX testing (if HER-2 is negative), if HER-2 is positive then she will need adjuvant chemo and port can be placed during surgery. 3. Adjuvant radiation therapy followed by 4. Adjuvant antiestrogen therapy  Oncotype counseling: I discussed Oncotype DX test. I explained to the patient that this is a 21 gene panel to evaluate patient tumors DNA to calculate recurrence score. This would help determine whether patient has high risk or intermediate risk or low risk breast cancer. She understands that if her tumor was found to be high risk, she would benefit from systemic chemotherapy. If low risk, no need of chemotherapy. If she was found to be intermediate risk, we would need to evaluate the score as well as other risk factors and determine if an abbreviated chemotherapy may be of benefit. We will call her with the results of HER-2 testing. Patient works at [employer]	[place].  She is very emotional and tearful today.  Return to clinic after surgery to discuss final pathology report and then determine if Oncotype DX testing will need to be sent.  All questions were answered. The patient knows to call the clinic with any problems, questions or concerns.   Rulon Eisenmenger, MD, MPH 09/01/2019    I, Molly Dorshimer, am acting as scribe for Nicholas Lose,  MD.  I have reviewed the above documentation for accuracy and completeness, and I agree with the above.

## 2019-09-01 ENCOUNTER — Other Ambulatory Visit: Payer: Self-pay

## 2019-09-01 ENCOUNTER — Inpatient Hospital Stay: Payer: Medicaid Other | Attending: Hematology and Oncology | Admitting: Hematology and Oncology

## 2019-09-01 DIAGNOSIS — Z170 Estrogen receptor positive status [ER+]: Secondary | ICD-10-CM | POA: Diagnosis present

## 2019-09-01 DIAGNOSIS — C50412 Malignant neoplasm of upper-outer quadrant of left female breast: Secondary | ICD-10-CM | POA: Diagnosis present

## 2019-09-01 DIAGNOSIS — K219 Gastro-esophageal reflux disease without esophagitis: Secondary | ICD-10-CM | POA: Diagnosis not present

## 2019-09-01 DIAGNOSIS — Z79899 Other long term (current) drug therapy: Secondary | ICD-10-CM | POA: Insufficient documentation

## 2019-09-01 DIAGNOSIS — Z803 Family history of malignant neoplasm of breast: Secondary | ICD-10-CM | POA: Diagnosis not present

## 2019-09-01 DIAGNOSIS — R599 Enlarged lymph nodes, unspecified: Secondary | ICD-10-CM | POA: Diagnosis present

## 2019-09-01 DIAGNOSIS — F17210 Nicotine dependence, cigarettes, uncomplicated: Secondary | ICD-10-CM | POA: Diagnosis not present

## 2019-09-01 NOTE — Assessment & Plan Note (Signed)
08/25/2019:Palpable left breast mass x1 month. Diagnostic mammogram and [place] on 08/18/19 showed a 2.8cm mass at the 2 o'clock position, and one lymph node with mild cortical thickening in the left axilla. Biopsy on 08/25/19 showed invasive mammary carcinoma in the breast, grade 3, HER-2 equivocal by IHC (2+), ER+ >90%, PR+ >90%, left axilla negative T2N0 stage Ia clinical stage  Pathology and radiology counseling:Discussed with the patient, the details of pathology including the type of breast cancer,the clinical staging, the significance of ER, PR and HER-2/neu receptors and the implications for treatment. After reviewing the pathology in detail, we proceeded to discuss the different treatment options between surgery, radiation, chemotherapy, antiestrogen therapies.  Recommendations: 1. Breast conserving surgery followed by 2. Oncotype DX testing to determine if chemotherapy would be of any benefit followed by 3. Adjuvant radiation therapy followed by 4. Adjuvant antiestrogen therapy  Oncotype counseling: I discussed Oncotype DX test. I explained to the patient that this is a 21 gene panel to evaluate patient tumors DNA to calculate recurrence score. This would help determine whether patient has high risk or intermediate risk or low risk breast cancer. She understands that if her tumor was found to be high risk, she would benefit from systemic chemotherapy. If low risk, no need of chemotherapy. If she was found to be intermediate risk, we would need to evaluate the score as well as other risk factors and determine if an abbreviated chemotherapy may be of benefit.  Return to clinic after surgery to discuss final pathology report and then determine if Oncotype DX testing will need to be sent.

## 2019-09-02 ENCOUNTER — Ambulatory Visit: Payer: Self-pay | Admitting: General Surgery

## 2019-09-02 ENCOUNTER — Encounter: Payer: Self-pay | Admitting: *Deleted

## 2019-09-02 DIAGNOSIS — C50412 Malignant neoplasm of upper-outer quadrant of left female breast: Secondary | ICD-10-CM

## 2019-09-02 DIAGNOSIS — Z170 Estrogen receptor positive status [ER+]: Secondary | ICD-10-CM

## 2019-09-06 ENCOUNTER — Encounter: Payer: Self-pay | Admitting: *Deleted

## 2019-09-06 ENCOUNTER — Encounter: Payer: Self-pay | Admitting: Hematology and Oncology

## 2019-09-06 ENCOUNTER — Telehealth: Payer: Self-pay | Admitting: *Deleted

## 2019-09-06 DIAGNOSIS — C801 Malignant (primary) neoplasm, unspecified: Secondary | ICD-10-CM

## 2019-09-06 HISTORY — PX: BREAST BIOPSY: SHX20

## 2019-09-06 HISTORY — DX: Malignant (primary) neoplasm, unspecified: C80.1

## 2019-09-06 LAB — SURGICAL PATHOLOGY

## 2019-09-06 NOTE — Telephone Encounter (Signed)
Spoke to pt regarding Her2 results and next steps. Informed pt she will receive a call from Dr. Ethlyn Gallery office with sx date and some instructions. Discussed once sx date has been scheduled we will schedule pt to see Dr. Lindi Adie approximately 1 wk after sx and order oncotype testing. Received verbal understanding. Denies further needs at this time.

## 2019-09-08 ENCOUNTER — Encounter: Payer: Self-pay | Admitting: *Deleted

## 2019-09-08 NOTE — Progress Notes (Signed)
Patient called yesterday wanting to know if her Medicaid had been approved.  Her surgery date is pending approval.  I emailed Tax inspector at [employer] in [place] yesterday.  Jada sent me an email today with patients approval.  Called and informed patient.  She is to notify her surgeons office.

## 2019-09-12 ENCOUNTER — Other Ambulatory Visit: Payer: Self-pay | Admitting: Family Medicine

## 2019-09-13 NOTE — Telephone Encounter (Signed)
Duplicate request

## 2019-09-13 NOTE — Telephone Encounter (Signed)
Last office visit 08/04/2019 with R. Baity for breast mass.  Last refilled 08/15/2019 for #40 with no refills.  No UDS/Contract on file.  No future appointments.

## 2019-09-14 MED ORDER — HYDROCODONE-ACETAMINOPHEN 5-325 MG PO TABS: 1.0000 | ORAL_TABLET | Freq: Four times a day (QID) | ORAL | 0 refills | Status: DC | PRN

## 2019-09-14 NOTE — Telephone Encounter (Signed)
Sent. Thanks.  Okay to continue as needed use for now. Okay to do UDS/contract at f/u.

## 2019-09-14 NOTE — Telephone Encounter (Signed)
Pt called in and was asking about refill. She said myChart told her it didn't go through. I let her know it was awaiting approval from pcp.

## 2019-09-14 NOTE — Telephone Encounter (Signed)
See other note.  Thanks.  Addressed.

## 2019-09-15 ENCOUNTER — Encounter: Payer: Self-pay | Admitting: *Deleted

## 2019-09-15 ENCOUNTER — Telehealth: Payer: Self-pay

## 2019-09-15 NOTE — Progress Notes (Signed)
Patient with concerns regarding her bills.  She does not have her Medicaid card yet.  She is going to send me her biopsy bills to be paid through [facility].

## 2019-09-15 NOTE — Telephone Encounter (Signed)
Abigail Butts from [facility] called to get medicaid #. Looked and so did [facility] and could not find [employer]. Abigail Butts said pt has not gotten her medicaid card yet and hoped we had #. Sorry. Nothing further needed.

## 2019-09-16 ENCOUNTER — Encounter: Payer: Self-pay | Admitting: *Deleted

## 2019-09-16 ENCOUNTER — Telehealth: Payer: Self-pay | Admitting: Hematology and Oncology

## 2019-09-16 NOTE — Telephone Encounter (Signed)
Scheduled appt per 6/11 sch message - pt is aware of appt date and time

## 2019-09-21 ENCOUNTER — Encounter: Payer: Self-pay | Admitting: *Deleted

## 2019-09-24 ENCOUNTER — Other Ambulatory Visit (HOSPITAL_COMMUNITY): Payer: Self-pay

## 2019-09-26 ENCOUNTER — Other Ambulatory Visit: Payer: Self-pay | Admitting: Family Medicine

## 2019-09-26 DIAGNOSIS — M5432 Sciatica, left side: Secondary | ICD-10-CM

## 2019-09-26 NOTE — Telephone Encounter (Signed)
Patient called requesting a refill  HYDROcodone-acetaminophen (NORCO) 5-325 MG tablet  She stated she knows she is early on the refill  but wanted to see if she can get it sooner because he medicaid will inactive in 3 days.   [facility] [place]

## 2019-09-26 NOTE — Telephone Encounter (Signed)
Name of Medication:hydrocodone apap 5-325 mg  Name of [facility] [place] or Written Date and Quantity: # 40 on 09/14/19 Last Office Visit and Type:acute 08/04/19 &  med refill on 07/18/19  Next Office Visit and Type: none scheduled Last Controlled Substance Agreement Date:none noted  Last MDE:KIYJ noted  Please see other note with this phone note from [facility] at front desk.

## 2019-09-27 MED ORDER — HYDROCODONE-ACETAMINOPHEN 5-325 MG PO TABS: 1.0000 | ORAL_TABLET | Freq: Four times a day (QID) | ORAL | 0 refills | Status: DC | PRN

## 2019-09-27 NOTE — Telephone Encounter (Signed)
Sent.  Given the ongoing pain med use, she needs to come by for UDS and pain med contract.  Please set up a lab visit.  Thanks.

## 2019-09-27 NOTE — Pre-Procedure Instructions (Signed)
Your procedure is scheduled on Friday, July 2nd from 10:00 AM- 11:30 AM.  Report to [facility] "[facility]" at 08:00 A.M., and check in at the Admitting office.  Call this number if you have problems the morning of surgery:  [phone]  Call [phone] if you have any questions prior to your surgery date Monday-Friday 8am-4pm.    Remember:  Do not eat after midnight the night before your surgery.  You may drink clear liquids until 07:00 AM the morning of your surgery.    Clear liquids allowed are: Water, Non-Citrus Juices (without pulp), Carbonated Beverages, Clear Tea, Black Coffee Only, and Gatorade.    Take these medicines the morning of surgery with A SIP OF WATER: gabapentin (NEURONTIN)  IF NEEDED: HYDROcodone-acetaminophen (NORCO) EPINEPHrine (PRIMATENE MIST IN)  As of today, STOP taking any Aspirin (unless otherwise instructed by your surgeon) and Aspirin containing products, Aleve, Naproxen, Ibuprofen, Motrin, Advil, Goody's, BC's, all herbal medications, fish oil, and all vitamins.          The Morning of Surgery:            Do not wear jewelry, make up, or nail polish.            Do not wear lotions, powders, perfumes, or deodorant.            Do not shave 48 hours prior to surgery.              Do not bring valuables to the hospital.            [facility] is not responsible for any belongings or valuables.  Do NOT Smoke (Tobacco/Vapping) or drink Alcohol 24 hours prior to your procedure.  If you use a CPAP at night, you may bring all equipment for your overnight stay.   Contacts, glasses, dentures or bridgework may not be worn into surgery.      For patients admitted to the hospital, discharge time will be determined by your treatment team.   Patients discharged the day of surgery will not be allowed to drive home, and someone needs to stay with them for 24 hours.    Special instructions:   [facility]- Preparing For Surgery  Before surgery, you  can play an important role. Because skin is not sterile, your skin needs to be as free of germs as possible. You can reduce the number of germs on your skin by washing with CHG (chlorahexidine gluconate) Soap before surgery.  CHG is an antiseptic cleaner which kills germs and bonds with the skin to continue killing germs even after washing.    Oral Hygiene is also important to reduce your risk of infection.  Remember - BRUSH YOUR TEETH THE MORNING OF SURGERY WITH YOUR REGULAR TOOTHPASTE  Please do not use if you have an allergy to CHG or antibacterial soaps. If your skin becomes reddened/irritated stop using the CHG.  Do not shave (including legs and underarms) for at least 48 hours prior to first CHG shower. It is OK to shave your face.  Please follow these instructions carefully.   1. Shower the NIGHT BEFORE SURGERY and the MORNING OF SURGERY with CHG Soap.   2. If you chose to wash your hair, wash your hair first as usual with your normal shampoo.  3. After you shampoo, rinse your hair and body thoroughly to remove the shampoo.  4. Use CHG as you would any other liquid soap. You can apply CHG directly  to the skin and wash gently with a scrungie or a clean washcloth.   5. Apply the CHG Soap to your body ONLY FROM THE NECK DOWN.  Do not use on open wounds or open sores. Avoid contact with your eyes, ears, mouth and genitals (private parts). Wash Face and genitals (private parts)  with your normal soap.   6. Wash thoroughly, paying special attention to the area where your surgery will be performed.  7. Thoroughly rinse your body with warm water from the neck down.  8. DO NOT shower/wash with your normal soap after using and rinsing off the CHG Soap.  9. Pat yourself dry with a CLEAN TOWEL.  10. Wear CLEAN PAJAMAS to bed the night before surgery, wear comfortable clothes the morning of surgery  11. Place CLEAN SHEETS on your bed the night of your first shower and DO NOT SLEEP WITH  PETS.   Day of Surgery: Shower with CHG Soap.   Do not apply any deodorants/lotions.  Please wear clean clothes to the hospital/surgery center.   Remember to brush your teeth WITH YOUR REGULAR TOOTHPASTE.   Please read over the following fact sheets that you were given.

## 2019-09-28 ENCOUNTER — Telehealth: Payer: Self-pay | Admitting: Family Medicine

## 2019-09-28 ENCOUNTER — Encounter (HOSPITAL_BASED_OUTPATIENT_CLINIC_OR_DEPARTMENT_OTHER): Payer: Self-pay | Admitting: General Surgery

## 2019-09-28 ENCOUNTER — Encounter (HOSPITAL_COMMUNITY)
Admission: RE | Admit: 2019-09-28 | Discharge: 2019-09-28 | Disposition: A | Discharge status: Home / Self Care | Payer: Medicaid Other | Source: Ambulatory Visit | Attending: General Surgery | Admitting: General Surgery

## 2019-09-28 ENCOUNTER — Other Ambulatory Visit: Payer: Self-pay

## 2019-09-28 MED ORDER — ALBUTEROL SULFATE HFA 108 (90 BASE) MCG/ACT IN AERS: 2.0000 | INHALATION_SPRAY | Freq: Four times a day (QID) | RESPIRATORY_TRACT | 3 refills | Status: DC | PRN

## 2019-09-28 NOTE — Telephone Encounter (Signed)
[facility]. plz ensure breathing is stable, no fever, without ongoing dyspnea wheezing or productive cough

## 2019-09-28 NOTE — Telephone Encounter (Signed)
Spoke with pt relaying Dr. Synthia Innocent message.  Pt verbalizes understanding.  Says she used Primatene Mist inhaler OTC and her breathing is stable.  But she knows it will be better once she gets the albuterol.  Denies any fever and was able to hold conversation without coughing.  Pt expresses her thanks.

## 2019-09-28 NOTE — Telephone Encounter (Addendum)
I thank all involved. Please check on patient tomorrow.  If needing albuterol frequently then we need to consider long-term controller medications.  Thanks.

## 2019-09-28 NOTE — Telephone Encounter (Signed)
Noted. Thanks.

## 2019-09-28 NOTE — Progress Notes (Signed)

## 2019-09-28 NOTE — Telephone Encounter (Signed)
Patient called today requesting a refill on albuterol (VENTOLIN HFA) 108 (90 Base) MCG/ACT inhaler  She stated that she was in the surgery center today and had an asthma attack.  They advised her to call the office to have this sent today .     [facility] ([place]), [place] - [address]

## 2019-09-29 NOTE — Telephone Encounter (Signed)
Left detailed message on voicemail. DPR 

## 2019-10-04 ENCOUNTER — Other Ambulatory Visit (HOSPITAL_COMMUNITY)
Admission: RE | Admit: 2019-10-04 | Discharge: 2019-10-04 | Disposition: A | Discharge status: Home / Self Care | Payer: Medicaid Other | Source: Ambulatory Visit | Attending: General Surgery | Admitting: General Surgery

## 2019-10-04 DIAGNOSIS — Z01812 Encounter for preprocedural laboratory examination: Secondary | ICD-10-CM | POA: Insufficient documentation

## 2019-10-04 DIAGNOSIS — Z20822 Contact with and (suspected) exposure to covid-19: Secondary | ICD-10-CM | POA: Diagnosis not present

## 2019-10-04 LAB — SARS CORONAVIRUS 2 (TAT 6-24 HRS): SARS Coronavirus 2: NEGATIVE

## 2019-10-06 HISTORY — PX: BREAST LUMPECTOMY: SHX2

## 2019-10-07 ENCOUNTER — Encounter (HOSPITAL_BASED_OUTPATIENT_CLINIC_OR_DEPARTMENT_OTHER)
Admission: RE | Disposition: A | Discharge status: Home / Self Care | Payer: Self-pay | Source: Home / Self Care | Attending: General Surgery

## 2019-10-07 ENCOUNTER — Other Ambulatory Visit: Payer: Self-pay

## 2019-10-07 ENCOUNTER — Encounter (HOSPITAL_COMMUNITY)
Admission: RE | Admit: 2019-10-07 | Discharge: 2019-10-07 | Disposition: A | Discharge status: Home / Self Care | Payer: Medicaid Other | Source: Ambulatory Visit | Attending: General Surgery | Admitting: General Surgery

## 2019-10-07 ENCOUNTER — Ambulatory Visit (HOSPITAL_BASED_OUTPATIENT_CLINIC_OR_DEPARTMENT_OTHER): Payer: Medicaid Other | Admitting: Anesthesiology

## 2019-10-07 ENCOUNTER — Ambulatory Visit (HOSPITAL_COMMUNITY)
Admission: RE | Admit: 2019-10-07 | Discharge: 2019-10-07 | Disposition: A | Discharge status: Home / Self Care | Payer: Medicaid Other | Attending: General Surgery | Admitting: General Surgery

## 2019-10-07 ENCOUNTER — Encounter (HOSPITAL_BASED_OUTPATIENT_CLINIC_OR_DEPARTMENT_OTHER): Payer: Self-pay | Admitting: General Surgery

## 2019-10-07 DIAGNOSIS — Z170 Estrogen receptor positive status [ER+]: Secondary | ICD-10-CM | POA: Insufficient documentation

## 2019-10-07 DIAGNOSIS — G8918 Other acute postprocedural pain: Secondary | ICD-10-CM | POA: Diagnosis not present

## 2019-10-07 DIAGNOSIS — J449 Chronic obstructive pulmonary disease, unspecified: Secondary | ICD-10-CM | POA: Diagnosis not present

## 2019-10-07 DIAGNOSIS — C50412 Malignant neoplasm of upper-outer quadrant of left female breast: Secondary | ICD-10-CM

## 2019-10-07 DIAGNOSIS — Z79899 Other long term (current) drug therapy: Secondary | ICD-10-CM | POA: Diagnosis not present

## 2019-10-07 DIAGNOSIS — F17200 Nicotine dependence, unspecified, uncomplicated: Secondary | ICD-10-CM | POA: Diagnosis not present

## 2019-10-07 DIAGNOSIS — Z803 Family history of malignant neoplasm of breast: Secondary | ICD-10-CM | POA: Insufficient documentation

## 2019-10-07 HISTORY — DX: Anxiety disorder, unspecified: F41.9

## 2019-10-07 HISTORY — DX: Chronic obstructive pulmonary disease, unspecified: J44.9

## 2019-10-07 HISTORY — PX: BREAST LUMPECTOMY WITH SENTINEL LYMPH NODE BIOPSY: SHX5597

## 2019-10-07 HISTORY — DX: Other chronic pain: G89.29

## 2019-10-07 SURGERY — BREAST LUMPECTOMY WITH SENTINEL LYMPH NODE BX
Anesthesia: General | Site: Breast | Laterality: Left

## 2019-10-07 MED ORDER — OXYCODONE HCL 5 MG PO TABS
ORAL_TABLET | ORAL | Status: AC
  Filled 2019-10-07: qty 1

## 2019-10-07 MED ORDER — OXYCODONE HCL 5 MG PO TABS
5.0000 mg | ORAL_TABLET | Freq: Once | ORAL | Status: AC | PRN
  Administered 2019-10-07: 5 mg via ORAL

## 2019-10-07 MED ORDER — ALBUTEROL SULFATE (2.5 MG/3ML) 0.083% IN NEBU
INHALATION_SOLUTION | RESPIRATORY_TRACT | Status: AC
  Filled 2019-10-07: qty 3

## 2019-10-07 MED ORDER — ALBUTEROL SULFATE (2.5 MG/3ML) 0.083% IN NEBU
2.5000 mg | INHALATION_SOLUTION | Freq: Once | RESPIRATORY_TRACT | Status: AC
  Administered 2019-10-07: 2.5 mg via RESPIRATORY_TRACT

## 2019-10-07 MED ORDER — MIDAZOLAM HCL 2 MG/2ML IJ SOLN
1.0000 mg | Freq: Once | INTRAMUSCULAR | Status: AC
  Administered 2019-10-07: 1 mg via INTRAVENOUS

## 2019-10-07 MED ORDER — FENTANYL CITRATE (PF) 100 MCG/2ML IJ SOLN
INTRAMUSCULAR | Status: AC
  Filled 2019-10-07: qty 2

## 2019-10-07 MED ORDER — BUPIVACAINE HCL 0.25 % IJ SOLN
INTRAMUSCULAR | Status: DC | PRN
  Administered 2019-10-07: 10 mL

## 2019-10-07 MED ORDER — MIDAZOLAM HCL 2 MG/2ML IJ SOLN
INTRAMUSCULAR | Status: AC
  Filled 2019-10-07: qty 2

## 2019-10-07 MED ORDER — CEFAZOLIN SODIUM-DEXTROSE 2-4 GM/100ML-% IV SOLN
INTRAVENOUS | Status: AC
  Filled 2019-10-07: qty 100

## 2019-10-07 MED ORDER — KETOROLAC TROMETHAMINE 30 MG/ML IJ SOLN: 30.0000 mg | Freq: Once | INTRAMUSCULAR | Status: DC | PRN

## 2019-10-07 MED ORDER — GABAPENTIN 300 MG PO CAPS
300.0000 mg | ORAL_CAPSULE | ORAL | Status: AC
  Administered 2019-10-07: 300 mg via ORAL

## 2019-10-07 MED ORDER — LIDOCAINE 2% (20 MG/ML) 5 ML SYRINGE
INTRAMUSCULAR | Status: AC
  Filled 2019-10-07: qty 5

## 2019-10-07 MED ORDER — EPHEDRINE SULFATE 50 MG/ML IJ SOLN
INTRAMUSCULAR | Status: DC | PRN
  Administered 2019-10-07 (×2): 10 mg via INTRAVENOUS

## 2019-10-07 MED ORDER — CEFAZOLIN SODIUM-DEXTROSE 2-4 GM/100ML-% IV SOLN
2.0000 g | INTRAVENOUS | Status: AC
  Administered 2019-10-07: 2 g via INTRAVENOUS

## 2019-10-07 MED ORDER — ONDANSETRON HCL 4 MG/2ML IJ SOLN
INTRAMUSCULAR | Status: DC | PRN
  Administered 2019-10-07: 4 mg via INTRAVENOUS

## 2019-10-07 MED ORDER — FENTANYL CITRATE (PF) 100 MCG/2ML IJ SOLN
25.0000 ug | INTRAMUSCULAR | Status: DC | PRN
  Administered 2019-10-07: 25 ug via INTRAVENOUS
  Administered 2019-10-07: 50 ug via INTRAVENOUS

## 2019-10-07 MED ORDER — FENTANYL CITRATE (PF) 100 MCG/2ML IJ SOLN
INTRAMUSCULAR | Status: DC | PRN
  Administered 2019-10-07: 50 ug via INTRAVENOUS

## 2019-10-07 MED ORDER — ROPIVACAINE HCL 5 MG/ML IJ SOLN
INTRAMUSCULAR | Status: DC | PRN
  Administered 2019-10-07: 30 mL

## 2019-10-07 MED ORDER — FENTANYL CITRATE (PF) 100 MCG/2ML IJ SOLN
50.0000 ug | Freq: Once | INTRAMUSCULAR | Status: AC
  Administered 2019-10-07: 50 ug via INTRAVENOUS

## 2019-10-07 MED ORDER — OXYCODONE HCL 5 MG PO TABS
5.0000 mg | ORAL_TABLET | Freq: Four times a day (QID) | ORAL | 0 refills | Status: DC | PRN
Start: 2019-10-07 — End: 2020-01-09

## 2019-10-07 MED ORDER — OXYCODONE HCL 5 MG/5ML PO SOLN: 5.0000 mg | Freq: Once | ORAL | Status: AC | PRN

## 2019-10-07 MED ORDER — ONDANSETRON HCL 4 MG/2ML IJ SOLN
INTRAMUSCULAR | Status: AC
  Filled 2019-10-07: qty 2

## 2019-10-07 MED ORDER — METHYLENE BLUE 0.5 % INJ SOLN
INTRAVENOUS | Status: AC
  Filled 2019-10-07: qty 10

## 2019-10-07 MED ORDER — EPHEDRINE 5 MG/ML INJ
INTRAVENOUS | Status: AC
  Filled 2019-10-07: qty 10

## 2019-10-07 MED ORDER — LACTATED RINGERS IV SOLN: INTRAVENOUS | Status: DC

## 2019-10-07 MED ORDER — SODIUM CHLORIDE (PF) 0.9 % IJ SOLN
INTRAMUSCULAR | Status: AC
  Filled 2019-10-07: qty 10

## 2019-10-07 MED ORDER — TECHNETIUM TC 99M SULFUR COLLOID FILTERED
1.0000 | Freq: Once | INTRAVENOUS | Status: AC | PRN
  Administered 2019-10-07: 1 via INTRADERMAL

## 2019-10-07 MED ORDER — GABAPENTIN 300 MG PO CAPS
ORAL_CAPSULE | ORAL | Status: AC
  Filled 2019-10-07: qty 1

## 2019-10-07 MED ORDER — CHLORHEXIDINE GLUCONATE CLOTH 2 % EX PADS: 6.0000 | MEDICATED_PAD | Freq: Once | CUTANEOUS | Status: DC

## 2019-10-07 MED ORDER — DEXAMETHASONE SODIUM PHOSPHATE 10 MG/ML IJ SOLN
INTRAMUSCULAR | Status: AC
  Filled 2019-10-07: qty 1

## 2019-10-07 MED ORDER — LIDOCAINE HCL (CARDIAC) PF 100 MG/5ML IV SOSY
PREFILLED_SYRINGE | INTRAVENOUS | Status: DC | PRN
  Administered 2019-10-07: 50 mg via INTRAVENOUS

## 2019-10-07 MED ORDER — DEXAMETHASONE SODIUM PHOSPHATE 4 MG/ML IJ SOLN
INTRAMUSCULAR | Status: DC | PRN
  Administered 2019-10-07: 8 mg via INTRAVENOUS

## 2019-10-07 MED ORDER — PROPOFOL 10 MG/ML IV BOLUS
INTRAVENOUS | Status: DC | PRN
  Administered 2019-10-07: 150 mg via INTRAVENOUS

## 2019-10-07 MED ORDER — BUPIVACAINE-EPINEPHRINE 0.25% -1:200000 IJ SOLN: INTRAMUSCULAR | Status: DC | PRN

## 2019-10-07 SURGICAL SUPPLY — 48 items
ADH SKN CLS APL DERMABOND .7 (GAUZE/BANDAGES/DRESSINGS) ×1
APL PRP STRL LF DISP 70% ISPRP (MISCELLANEOUS) ×1
APPLIER CLIP 11 MED OPEN (CLIP) ×6
APR CLP MED 11 20 MLT OPN (CLIP) ×2
BLADE SURG 15 STRL LF DISP TIS (BLADE) ×1 IMPLANT
BLADE SURG 15 STRL SS (BLADE) ×3
CANISTER SUCT 1200ML W/VALVE (MISCELLANEOUS) ×3 IMPLANT
CHLORAPREP W/TINT 26 (MISCELLANEOUS) ×3 IMPLANT
CLIP APPLIE 11 MED OPEN (CLIP) ×1 IMPLANT
COVER BACK TABLE 60X90IN (DRAPES) ×3 IMPLANT
COVER MAYO STAND STRL (DRAPES) ×3 IMPLANT
COVER PROBE W GEL 5X96 (DRAPES) ×3 IMPLANT
COVER WAND RF STERILE (DRAPES) IMPLANT
DECANTER SPIKE VIAL GLASS SM (MISCELLANEOUS) IMPLANT
DERMABOND ADVANCED (GAUZE/BANDAGES/DRESSINGS) ×2
DERMABOND ADVANCED .7 DNX12 (GAUZE/BANDAGES/DRESSINGS) ×1 IMPLANT
DRAPE LAPAROSCOPIC ABDOMINAL (DRAPES) ×3 IMPLANT
DRAPE UTILITY XL STRL (DRAPES) ×3 IMPLANT
ELECT COATED BLADE 2.86 ST (ELECTRODE) ×3 IMPLANT
ELECT REM PT RETURN 9FT ADLT (ELECTROSURGICAL) ×3
ELECTRODE REM PT RTRN 9FT ADLT (ELECTROSURGICAL) ×1 IMPLANT
GLOVE BIO SURGEON STRL SZ7.5 (GLOVE) ×3 IMPLANT
GLOVE BIOGEL M 6.5 STRL (GLOVE) ×2 IMPLANT
GLOVE BIOGEL PI IND STRL 6.5 (GLOVE) IMPLANT
GLOVE BIOGEL PI INDICATOR 6.5 (GLOVE) ×2
GOWN STRL REUS W/ TWL LRG LVL3 (GOWN DISPOSABLE) ×2 IMPLANT
GOWN STRL REUS W/TWL LRG LVL3 (GOWN DISPOSABLE) ×6
ILLUMINATOR WAVEGUIDE N/F (MISCELLANEOUS) IMPLANT
KIT MARKER MARGIN INK (KITS) ×2 IMPLANT
LIGHT WAVEGUIDE WIDE FLAT (MISCELLANEOUS) IMPLANT
NDL HYPO 25X1 1.5 SAFETY (NEEDLE) ×1 IMPLANT
NDL SAFETY ECLIPSE 18X1.5 (NEEDLE) IMPLANT
NEEDLE HYPO 18GX1.5 SHARP (NEEDLE)
NEEDLE HYPO 25X1 1.5 SAFETY (NEEDLE) ×3 IMPLANT
NS IRRIG 1000ML POUR BTL (IV SOLUTION) ×3 IMPLANT
PACK BASIN DAY SURGERY FS (CUSTOM PROCEDURE TRAY) ×3 IMPLANT
PENCIL SMOKE EVACUATOR (MISCELLANEOUS) ×3 IMPLANT
SLEEVE SCD COMPRESS KNEE MED (MISCELLANEOUS) ×3 IMPLANT
SPONGE LAP 18X18 RF (DISPOSABLE) ×3 IMPLANT
SUT MON AB 4-0 PC3 18 (SUTURE) ×3 IMPLANT
SUT SILK 3 0 PS 1 (SUTURE) IMPLANT
SUT VICRYL 3-0 CR8 SH (SUTURE) ×3 IMPLANT
SYR CONTROL 10ML LL (SYRINGE) ×3 IMPLANT
TOWEL GREEN STERILE FF (TOWEL DISPOSABLE) ×3 IMPLANT
TRAY FAXITRON CT DISP (TRAY / TRAY PROCEDURE) ×2 IMPLANT
TUBE CONNECTING 20'X1/4 (TUBING) ×1
TUBE CONNECTING 20X1/4 (TUBING) ×2 IMPLANT
YANKAUER SUCT BULB TIP NO VENT (SUCTIONS) ×3 IMPLANT

## 2019-10-07 NOTE — H&P (Signed)
Julia [name]  Location: [place] [facility] Patient #: [id number] DOB: 01/31/1966 Married / Language: Undefined / Race: Refused to Report/Unreported Female   History of Present Illness  The patient is a 54 year old female who presents with breast cancer. we are asked to see the patient in consultation by Dr. Lindi Adie to evaluate her for a new left breast cancer. The patient is a 54 year old white female who recently felt a mass in the upper outer quadrant of the left breast. She was evaluated with mammogram and ultrasound and found to have a 2.8 cm mass in the upper outer quadrant. There was one abnormal looking lymph node but the biopsy on this was benign. 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 This is being restudied. She otherwise has very few medical problems but she does smoke about 2 packs of cigarettes a day. She has a history of breast cancer in a maternal aunt and colon cancer in an uncle.   Past Surgical History Breast Biopsy  Left. Breast Mass; Local Excision  Left.  Diagnostic Studies History  Colonoscopy  never Pap Smear  never  Allergies  No Known Drug Allergies   Medication History  Ibuprofen (600MG Tablet, Oral) Active. Gabapentin (300MG Capsule, Oral) Active. Medications Reconciled  Social History  Alcohol use  Remotely quit alcohol use. Caffeine use  Coffee, Tea. No drug use  Tobacco use  Current some day smoker.  Family History  Alcohol Abuse  Father, Sister. Breast Cancer  Family Members In General. Heart Disease  Father, Mother. Hypertension  Father, Mother.  Pregnancy / Birth History  Age at menarche  71 years. Age of menopause  <44 Gravida  1 Irregular periods  Maternal age  55-20 Para  1  Other Problems No pertinent past medical history     Review of Systems  General Not Present- Appetite Loss, Chills, Fatigue, Fever, Night  Sweats, Weight Gain and Weight Loss. Skin Not Present- Change in Wart/Mole, Dryness, Hives, Jaundice, New Lesions, Non-Healing Wounds, Rash and Ulcer. HEENT Not Present- Earache, Hearing Loss, Hoarseness, Nose Bleed, Oral Ulcers, Ringing in the Ears, Seasonal Allergies, Sinus Pain, Sore Throat, Visual Disturbances, Wears glasses/contact lenses and Yellow Eyes. Respiratory Not Present- Bloody sputum, Chronic Cough, Difficulty Breathing, Snoring and Wheezing. Breast Present- Breast Mass. Not Present- Breast Pain, Nipple Discharge and Skin Changes. Cardiovascular Not Present- Chest Pain, Difficulty Breathing Lying Down, Leg Cramps, Palpitations, Rapid Heart Rate, Shortness of Breath and Swelling of Extremities. Gastrointestinal Not Present- Abdominal Pain, Bloating, Bloody Stool, Change in Bowel Habits, Chronic diarrhea, Constipation, Difficulty Swallowing, Excessive gas, Gets full quickly at meals, Hemorrhoids, Indigestion, Nausea, Rectal Pain and Vomiting. Musculoskeletal Present- Back Pain. Not Present- Joint Pain, Joint Stiffness, Muscle Pain, Muscle Weakness and Swelling of Extremities. Neurological Not Present- Decreased Memory, Fainting, Headaches, Numbness, Seizures, Tingling, Tremor, Trouble walking and Weakness. Psychiatric Not Present- Anxiety, Bipolar, Change in Sleep Pattern, Depression, Fearful and Frequent crying. Endocrine Not Present- Cold Intolerance, Excessive Hunger, Hair Changes, Heat Intolerance, Hot flashes and New Diabetes. Hematology Not Present- Blood Thinners, Easy Bruising, Excessive bleeding, Gland problems, HIV and Persistent Infections.  Vitals  Weight: 158 lb Height: 64in Body Surface Area: 1.77 m Body Mass Index: 27.12 kg/m  Pulse: 83 (Regular)        Physical Exam  General Mental Status-Alert. General Appearance-Consistent with stated age. Hydration-Well hydrated. Voice-Normal.  Head and Neck Head-normocephalic, atraumatic with no  lesions  or palpable masses. Trachea-midline. Thyroid Gland Characteristics - normal size and consistency.  Eye Eyeball - Bilateral-Extraocular movements intact. Sclera/Conjunctiva - Bilateral-No scleral icterus.  Chest and Lung Exam Chest and lung exam reveals -quiet, even and easy respiratory effort with no use of accessory muscles and on auscultation, normal breath sounds, no adventitious sounds and normal vocal resonance. Inspection Chest Wall - Normal. Back - normal.  Breast Note: there is a 3-4 cm palpable mass in the upper outer quadrant of the left breast. This seems mobile and not tethered to the chest wall. There are no overlying skin changes. There is no other palpable mass in either breast. There is no palpable axillary, supraclavicular, or cervical lymphadenopathy.   Cardiovascular Cardiovascular examination reveals -normal heart sounds, regular rate and rhythm with no murmurs and normal pedal pulses bilaterally.  Abdomen Inspection Inspection of the abdomen reveals - No Hernias. Skin - Scar - no surgical scars. Palpation/Percussion Palpation and Percussion of the abdomen reveal - Soft, Non Tender, No Rebound tenderness, No Rigidity (guarding) and No hepatosplenomegaly. Auscultation Auscultation of the abdomen reveals - Bowel sounds normal.  Neurologic Neurologic evaluation reveals -alert and oriented x 3 with no impairment of recent or remote memory. Mental Status-Normal.  Musculoskeletal Normal Exam - Left-Upper Extremity Strength Normal and Lower Extremity Strength Normal. Normal Exam - Right-Upper Extremity Strength Normal and Lower Extremity Strength Normal.  Lymphatic Head & Neck  General Head & Neck Lymphatics: Bilateral - Description - Normal. Axillary  General Axillary Region: Bilateral - Description - Normal. Tenderness - Non Tender. Femoral & Inguinal  Generalized Femoral & Inguinal Lymphatics: Bilateral - Description - Normal.  Tenderness - Non Tender.    Assessment & Plan  MALIGNANT NEOPLASM OF UPPER-OUTER QUADRANT OF LEFT BREAST IN FEMALE, ESTROGEN RECEPTOR POSITIVE (C50.412) Impression: the patient appears to have a 2.8 cm cancer in the upper outer quadrant of the left breast. She had 1 abnormal looking lymph node but the biopsy was benign. I have discussed with her in detail the different options for treatment and at this point she favors breast conservation. I think this is a reasonable way of approaching or cancer. I have discussed with her in detail the risks and benefits of the operation as well as some of the technical aspects including sentinel node biopsy and she understands and wishes to proceed. Since the cancer is palpable she will not need any localization. She will also follow up with medical and radiation oncology to discuss adjuvant therapy. This patient encounter took 60 minutes today to perform the following: take history, perform exam, review outside records, interpret imaging, counsel the patient on their diagnosis and document encounter, findings & plan in the EHR Current Plans Referred to Oncology, for evaluation and follow up (Oncology). Routine.

## 2019-10-07 NOTE — Anesthesia Procedure Notes (Signed)
Anesthesia Procedure Image    

## 2019-10-07 NOTE — Anesthesia Postprocedure Evaluation (Signed)
Anesthesia Post Note  Patient: Julia [name]  Procedure(s) Performed: LEFT BREAST LUMPECTOMY WITH SENTINEL LYMPH NODE BX (Left Breast)     Patient location during evaluation: PACU Anesthesia Type: General Level of consciousness: awake and alert Pain management: pain level controlled Vital Signs Assessment: post-procedure vital signs reviewed and stable Respiratory status: spontaneous breathing, nonlabored ventilation, respiratory function stable and patient connected to nasal cannula oxygen Cardiovascular status: blood pressure returned to baseline and stable Postop Assessment: no apparent nausea or vomiting Anesthetic complications: no   No complications documented.  Last Vitals:  Vitals:   10/07/19 1410 10/07/19 1415  BP:  136/86  Pulse: (!) 108 (!) 106  Resp: 16 18  Temp:    SpO2: 94% 94%    Last Pain:  Vitals:   10/07/19 1410  TempSrc:   PainSc: 10-Worst pain ever                 Mollyann Halbert S

## 2019-10-07 NOTE — Discharge Instructions (Signed)

## 2019-10-07 NOTE — Anesthesia Procedure Notes (Signed)
Anesthesia Regional Block: Pectoralis block   Pre-Anesthetic Checklist: ,, timeout performed, Correct Patient, Correct Site, Correct Laterality, Correct Procedure, Correct Position, site marked, Risks and benefits discussed,  Surgical consent,  Pre-op evaluation,  At surgeon's request and post-op pain management  Laterality: Left  Prep: chloraprep       Needles:  Injection technique: Single-shot  Needle Type: Echogenic Needle     Needle Length: 9cm      Additional Needles:   Procedures:,,,, ultrasound used (permanent image in chart),,,,  Narrative:  Start time: 10/07/2019 11:17 AM End time: 10/07/2019 11:25 AM Injection made incrementally with aspirations every 5 mL.  Performed by: Personally  Anesthesiologist: Myrtie Soman, MD  Additional Notes: Patient tolerated the procedure well without complications

## 2019-10-07 NOTE — Anesthesia Procedure Notes (Signed)
Procedure Name: LMA Insertion Performed by: Jakiah Bienaime, Oliver, CRNA Pre-anesthesia Checklist: Patient identified, Emergency Drugs available, Suction available and Patient being monitored Patient Re-evaluated:Patient Re-evaluated prior to induction Oxygen Delivery Method: Circle system utilized Preoxygenation: Pre-oxygenation with 100% oxygen Induction Type: IV induction Ventilation: Mask ventilation without difficulty LMA: LMA inserted LMA Size: 4.0 Number of attempts: 1 Airway Equipment and Method: Bite block Placement Confirmation: positive ETCO2 Tube secured with: Tape Dental Injury: Teeth and Oropharynx as per pre-operative assessment        

## 2019-10-07 NOTE — Interval H&P Note (Signed)
History and Physical Interval Note:  10/07/2019 11:33 AM  Julia [name]  has presented today for surgery, with the diagnosis of LEFT BREAST CANCER.  The various methods of treatment have been discussed with the patient and family. After consideration of risks, benefits and other options for treatment, the patient has consented to  Procedure(s) with comments: LEFT BREAST LUMPECTOMY WITH SENTINEL LYMPH NODE BX (Left) - PEC BLOCK as a surgical intervention.  The patient's history has been reviewed, patient examined, no change in status, stable for surgery.  I have reviewed the patient's chart and labs.  Questions were answered to the patient's satisfaction.     Autumn Messing III

## 2019-10-07 NOTE — Progress Notes (Signed)
Assisted Dr. Rose with left, ultrasound guided, pectoralis block. Side rails up, monitors on throughout procedure. See vital signs in flow sheet. Tolerated Procedure well. 

## 2019-10-07 NOTE — Transfer of Care (Signed)
Immediate Anesthesia Transfer of Care Note  Patient: Julia [name]  Procedure(s) Performed: LEFT BREAST LUMPECTOMY WITH SENTINEL LYMPH NODE BX (Left Breast)  Patient Location: PACU  Anesthesia Type:GA combined with regional for post-op pain  Level of Consciousness: sedated  Airway & Oxygen Therapy: Patient Spontanous Breathing and Patient connected to nasal cannula oxygen  Post-op Assessment: Report given to RN and Post -op Vital signs reviewed and stable  Post vital signs: Reviewed and stable  Last Vitals:  Vitals Value Taken Time  BP 111/65 10/07/19 1338  Temp    Pulse 91 10/07/19 1341  Resp 7 10/07/19 1341  SpO2 100 % 10/07/19 1341  Vitals shown include unvalidated device data.  Last Pain:  Vitals:   10/07/19 1023  TempSrc: Oral  PainSc: 0-No pain      Patients Stated Pain Goal: 6 (16/24/46 [id number])  Complications: No complications documented.

## 2019-10-07 NOTE — Telephone Encounter (Signed)
Patient advised and says she is having surgery today for cancer and after she gets through this, she will make an appointment with the lab for the UDS and pain mgmt contract.

## 2019-10-07 NOTE — Anesthesia Preprocedure Evaluation (Addendum)
Anesthesia Evaluation  Patient identified by MRN, date of birth, ID band Patient awake    Reviewed: Allergy & Precautions, NPO status , Patient's Chart, lab work & pertinent test results  Airway Mallampati: II  TM Distance: >3 FB Neck ROM: Full    Dental no notable dental hx.    Pulmonary COPD, Current Smoker,    Pulmonary exam normal breath sounds clear to auscultation       Cardiovascular negative cardio ROS Normal cardiovascular exam Rhythm:Regular Rate:Normal     Neuro/Psych Anxiety negative neurological ROS     GI/Hepatic Neg liver ROS, GERD  ,  Endo/Other  negative endocrine ROS  Renal/GU negative Renal ROS  negative genitourinary   Musculoskeletal negative musculoskeletal ROS (+)   Abdominal   Peds negative pediatric ROS (+)  Hematology negative hematology ROS (+)   Anesthesia Other Findings   Reproductive/Obstetrics negative OB ROS                             Anesthesia Physical Anesthesia Plan  ASA: III  Anesthesia Plan: General   Post-op Pain Management:  Regional for Post-op pain   Induction: Intravenous  PONV Risk Score and Plan: 2 and Ondansetron, Dexamethasone and Treatment may vary due to age or medical condition  Airway Management Planned: LMA  Additional Equipment:   Intra-op Plan:   Post-operative Plan: Extubation in OR  Informed Consent: I have reviewed the patients History and Physical, chart, labs and discussed the procedure including the risks, benefits and alternatives for the proposed anesthesia with the patient or authorized representative who has indicated his/her understanding and acceptance.     Dental advisory given  Plan Discussed with: CRNA and Surgeon  Anesthesia Plan Comments:         Anesthesia Quick Evaluation

## 2019-10-07 NOTE — Op Note (Signed)
10/07/2019  1:33 PM  PATIENT:  Julia [name]  54 y.o. female  PRE-OPERATIVE DIAGNOSIS:  LEFT BREAST CANCER  POST-OPERATIVE DIAGNOSIS:  Left Breast Cancer  PROCEDURE:  Procedure(s) with comments: LEFT BREAST LUMPECTOMY WITH SENTINEL LYMPH NODE BX (Left) - PEC BLOCK  SURGEON:  Surgeon(s) and Role:    * Jovita Kussmaul, MD - Primary  PHYSICIAN ASSISTANT:   ASSISTANTS: none   ANESTHESIA:   local and general  EBL:  10 mL   BLOOD ADMINISTERED:none  DRAINS: none   LOCAL MEDICATIONS USED:  MARCAINE     SPECIMEN:  Source of Specimen:  left breast tissue and sentinel nodes x 2  DISPOSITION OF SPECIMEN:  PATHOLOGY  COUNTS:  YES  TOURNIQUET:  * No tourniquets in log *  DICTATION: .Dragon Dictation   After informed consent was obtained the patient was brought to the operating room and placed in the supine position on the operating table.  After adequate induction of general anesthesia the patient's left chest, breast, and axillary area were prepped with ChloraPrep, allowed to dry, and draped in usual sterile manner.  An appropriate timeout was performed.  Earlier in the day the patient underwent injection 1 mCi of technetium sulfur colloid in the subareolar position on the left.  The neoprobe was set to technetium and an area of radioactivity was readily identified.  The cancer was in the very lateral portion of the left breast and was large and palpable.  Because of its location to the axilla I decided to do everything through 1 incision.  A crescent shaped incision was made in the skin overlying the palpable mass with a 15 blade knife.  The incision was carried through the skin and subcutaneous tissue sharply with the electrocautery.  Dissection was carried sharply around the palpable mass sharply with the electrocautery until the dissection reached the chest wall.  Once this was accomplished the specimen was removed from the patient.  It was oriented with the appropriate paint colors.   On palpating the specimen it felt as though we were on the cancer on the lateral edge.  Because of this I elected to remove an additional lateral edge and this was marked with the appropriate paint color.  All of this tissue was sent to pathology for further evaluation.  At this point the dissection was carried into the deep left axillary space under the direction of the neoprobe.  I was able to identify a hot lymph node.  This was excised sharply with the electrocautery and the surrounding small vessels and lymphatics were controlled with clips.  Ex vivo counts on this node were approximately 1500.  I was also able to identify a large palpable lymph node just superior to this which was also excised sharply with the electrocautery and the surrounding small vessels and lymphatics were controlled with clips.  This lymph node had no radioactive signal.  These were sent as sentinel nodes numbers 1 and 2.  No other hot or palpable nodes were identified in the left axilla.  Hemostasis was achieved using the Bovie electrocautery.  The wound was irrigated with saline.  The axilla was then closed with interrupted 3-0 Vicryl stitches.  The breast tissue was then reapproximated in layers with interrupted 3-0 Vicryl stitches.  The skin was then closed with a running 4-0 Monocryl subcuticular stitch.  Dermabond dressings were applied.  The patient tolerated the procedure well.  At the end of the case all needle sponge and instrument counts were correct.  The patient was then awakened and taken to recovery in stable condition.  PLAN OF CARE: Discharge to home after PACU  PATIENT DISPOSITION:  PACU - hemodynamically stable.   Delay start of Pharmacological VTE agent (>24hrs) due to surgical blood loss or risk of bleeding: not applicable

## 2019-10-10 NOTE — Telephone Encounter (Signed)
Noted. Thanks. Agreed.  

## 2019-10-11 ENCOUNTER — Encounter (HOSPITAL_BASED_OUTPATIENT_CLINIC_OR_DEPARTMENT_OTHER): Payer: Self-pay | Admitting: General Surgery

## 2019-10-12 ENCOUNTER — Telehealth: Payer: Self-pay | Admitting: *Deleted

## 2019-10-12 DIAGNOSIS — R05 Cough: Secondary | ICD-10-CM

## 2019-10-12 DIAGNOSIS — R059 Cough, unspecified: Secondary | ICD-10-CM

## 2019-10-12 NOTE — Telephone Encounter (Signed)
Patient left a voicemail stating that when she had her cancer surgery she was given a nebulizer treatment that helped a lot. Patient wants to know if this is something Dr. Damita Dunnings can prescribe for her?

## 2019-10-13 LAB — SURGICAL PATHOLOGY

## 2019-10-13 MED ORDER — ALBUTEROL SULFATE (2.5 MG/3ML) 0.083% IN NEBU
2.5000 mg | INHALATION_SOLUTION | Freq: Four times a day (QID) | RESPIRATORY_TRACT | 1 refills | Status: DC | PRN
Start: 2019-10-13 — End: 2022-05-19

## 2019-10-13 NOTE — Telephone Encounter (Signed)
I sent the nebulized albuterol prescription.  If she is needing it frequently (more than a few times a week) then we need to change her medications, meaning add on a preventive inhaler.  If that is the case, then I need her to update me.  Thanks.

## 2019-10-14 ENCOUNTER — Telehealth: Payer: Self-pay | Admitting: *Deleted

## 2019-10-14 ENCOUNTER — Encounter: Payer: Self-pay | Admitting: *Deleted

## 2019-10-14 MED ORDER — HYDROCODONE-ACETAMINOPHEN 5-325 MG PO TABS: 1.0000 | ORAL_TABLET | Freq: Four times a day (QID) | ORAL | 0 refills | Status: DC | PRN

## 2019-10-14 NOTE — Telephone Encounter (Signed)
Nebulizer can go anywhere.   Pt stated that she picked up the oxycodone on 10/07/2019 and it was filled for 15 tablets. Pt states that she 2 pills left.   Pt stated that the pain is from the surgical site and from her back pain.  Pt states that she has 4 pills of the hydrocodone left. Hydrocodone was last filled on 09/27/2019.  Pt has an appointment with Dr. Gwendolyn Fill office on 10/27/2019 at 11am.  Pt stated that she has contacted Dr. Gwendolyn Fill office but has not heard back from them yet.

## 2019-10-14 NOTE — Telephone Encounter (Signed)
Community message sent to [facility] to fill prescription for nebulizer. Skeet Latch has responded and will start on order.

## 2019-10-14 NOTE — Telephone Encounter (Signed)
Patient states she needs a presciption/order for a nebulizer machine - so she can use her nebulizer solution.   Patient also states she needs a refill on her Hydrocodone. This was last refilled 09/27/19 for #40 with 0 refills.  Dr. Damita Dunnings, please advise.

## 2019-10-14 NOTE — Telephone Encounter (Signed)
Short term rx sent and if pain isn't improving in the meantime then needs f/u.

## 2019-10-14 NOTE — Telephone Encounter (Signed)
Pt contacted and informed rx for hydrocodone #20 has been sent in. Pt also informed that [facility] has the order for the nebulizer.

## 2019-10-14 NOTE — Telephone Encounter (Signed)
Please send an order for a neb machine.    I need extra details from patient.  This is early to fill the hydrocodone, esp with recent oxycodone from Dr. [name].  If pain is uncontrolled with her current meds, then she needs to get rechecked vs update Dr. Ethlyn Gallery clinic.  Please see what details you can get from patient.

## 2019-10-14 NOTE — Telephone Encounter (Signed)
Received order for oncotype testing. Requisition faxed to pathology and [facility] °

## 2019-10-17 DIAGNOSIS — R05 Cough: Secondary | ICD-10-CM | POA: Diagnosis not present

## 2019-10-17 DIAGNOSIS — Z4889 Encounter for other specified surgical aftercare: Secondary | ICD-10-CM | POA: Diagnosis not present

## 2019-10-17 DIAGNOSIS — Z853 Personal history of malignant neoplasm of breast: Secondary | ICD-10-CM | POA: Diagnosis not present

## 2019-10-17 NOTE — Progress Notes (Signed)
Patient Care Team: Julia Ghent, MD as PCP - General (Family Medicine) [name], RN as Registered Nurse [name], RN as Registered Nurse  DIAGNOSIS:    ICD-10-CM   1. Malignant neoplasm of upper-outer quadrant of left breast in female, estrogen receptor positive ([place])  C50.412    Z17.0     SUMMARY OF ONCOLOGIC HISTORY: Oncology History  Malignant neoplasm of upper-outer quadrant of left breast in female, estrogen receptor positive ([place])  08/25/2019 Initial Diagnosis   Palpable left breast mass x1 month. Diagnostic mammogram and [place] on 08/18/19 showed a 2.8cm mass at the 2 o'clock position, and one lymph node with mild cortical thickening in the left axilla. 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   10/18/2019 Cancer Staging   Staging form: Breast, AJCC 8th Edition - Pathologic stage from 10/18/2019: Stage IB (pT2, pN0(sn), cM0, G3, ER+, PR+, HER2-) - Signed by Nicholas Lose, MD on 10/18/2019     CHIEF COMPLIANT: Follow-up s/p lumpectomy to review pathology   INTERVAL HISTORY: Julia [name] is a 54 y.o. with above-mentioned history of left breast cancer. She underwent a left lumpectomy and sentinel lymph node biopsy on 10/07/19 with Dr. 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 She presents to the clinic today to discuss the pathology report and further treatment.   ALLERGIES:  is allergic to propoxyphene n-acetaminophen, nabumetone, and rofecoxib.  MEDICATIONS:  Current Outpatient Medications  Medication Sig Dispense Refill  . albuterol (PROVENTIL) (2.5 MG/3ML) 0.083% nebulizer  solution Take 3 mLs (2.5 mg total) by nebulization every 6 (six) hours as needed for wheezing or shortness of breath. 75 mL 1  . albuterol (VENTOLIN HFA) 108 (90 Base) MCG/ACT inhaler Inhale 2 puffs into the lungs every 6 (six) hours as needed for wheezing or shortness of breath. 18 g 3  . diphenhydrAMINE HCl (ALLERGY MEDICATION PO) Take by mouth.    . EPINEPHrine (PRIMATENE MIST IN) Inhale 1 puff into the lungs every 6 (six) hours as needed (wheezing/shortness of breath.).    Marland Kitchen gabapentin (NEURONTIN) 300 MG capsule Take up to 4 tabs a day (2 tabs twice a day) (Patient taking differently: Take 300 mg by mouth in the morning, at noon, and at bedtime. )    . HYDROcodone-acetaminophen (NORCO) 5-325 MG tablet Take 1-2 tablets by mouth every 6 (six) hours as needed for severe pain. Sedation caution 20 tablet 0  . ibuprofen (ADVIL) 600 MG tablet Take 1 tablet (600 mg total) by mouth every 8 (eight) hours as needed (For pain.  Take with food.). 90 tablet 2  . omeprazole (PRILOSEC OTC) 20 MG tablet Take 20 mg by mouth daily.    Marland Kitchen oxyCODONE (OXY IR/ROXICODONE) 5 MG immediate release tablet Take 1-2 tablets (5-10 mg total) by mouth every 6 (six) hours as needed for moderate pain, severe pain or breakthrough pain. 15 tablet 0   No current facility-administered medications for this visit.    PHYSICAL EXAMINATION: ECOG PERFORMANCE STATUS: 1 - Symptomatic but completely ambulatory  There were no vitals filed for this visit. There were no vitals filed for this visit.  LABORATORY DATA:  I have reviewed the data as listed CMP Latest Ref Rng & Units 07/18/2019 04/13/2017 07/03/2016  Glucose 70 - 99 mg/dL 115(H) 127(H) 131(H)  BUN 6 - 23 mg/dL _0 Creatinine 0.40 - 1.20 mg/dL 0.64 0.83 0.82  Sodium 135 - 145 mEq/L 137 139 139  Potassium 3.5 - 5.1 mEq/L 4.5 4.3 3.8  Chloride 96 - 112 mEq/L 104 106 103  CO2 19 - 32 mEq/L _1 Calcium 8.4 - 10.5 mg/dL 9.6 9.5 9.9  Total Protein 6.0 - 8.3 g/dL - 6.6 -    Total Bilirubin 0.2 - 1.2 mg/dL - 0.3 -  Alkaline Phos 39 - 117 U/L - 80 -  AST 0 - 37 U/L - 16 -  ALT 0 - 35 U/L - 23 -    Lab Results  Component Value Date   WBC 9.5 04/13/2017   HGB 14.4 04/13/2017   HCT 40.4 04/13/2017   MCV 93.8 04/13/2017   PLT 234.0 04/13/2017   NEUTROABS 5.0 04/13/2017    ASSESSMENT & PLAN:  Malignant neoplasm of upper-outer quadrant of left breast in female, estrogen receptor positive ([place]) 10/07/2019:Left lumpectomy and sentinel lymph node biopsy [name]): IDC with DCIS, 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  ER 90%, PR 90%, HER-2 negative by FISH T2N0 stage Ib  Pathology counseling: I discussed the final pathology report of the patient provided  a copy of this report. I discussed the margins as well as lymph node surgeries. We also discussed the final staging along with previously performed ER/PR and HER-2/neu testing. The lateral margin still had residual cancer which was then cleared out by additional excision.  The posterior margin was focally positive but Dr. [name] told her yesterday that there is no further surgery needed.  I suspect that the posterior margin is a muscle. We will discuss this tomorrow in the tumor board conference.  Treatment plan: 1.  Oncotype DX to determine if she needs chemo. 2. adjuvant radiation therapy 3.  Follow-up adjuvant antiestrogen therapy  Patient is very emotional about the topic of chemotherapy. She works for [employer] and needs to know when she can return to work.  Return to clinic based upon Oncotype DX test report    No orders of the defined types were placed in this encounter.  The patient has a good understanding of the overall plan. she agrees with it. she will call with any problems that may develop before the next visit here.  Total time spent: 30 mins including face to face time and time spent for planning, charting and coordination of care  Nicholas Lose,  MD 10/18/2019  I, Cloyde Reams Dorshimer, am acting as scribe for Dr. Nicholas Lose.  I have reviewed the above documentation for accuracy and completeness, and I agree with the above.

## 2019-10-18 ENCOUNTER — Other Ambulatory Visit: Payer: Self-pay

## 2019-10-18 ENCOUNTER — Inpatient Hospital Stay: Payer: Medicaid Other | Attending: Hematology and Oncology | Admitting: Hematology and Oncology

## 2019-10-18 DIAGNOSIS — C50412 Malignant neoplasm of upper-outer quadrant of left female breast: Secondary | ICD-10-CM | POA: Diagnosis not present

## 2019-10-18 DIAGNOSIS — R599 Enlarged lymph nodes, unspecified: Secondary | ICD-10-CM | POA: Diagnosis not present

## 2019-10-18 DIAGNOSIS — Z170 Estrogen receptor positive status [ER+]: Secondary | ICD-10-CM

## 2019-10-18 DIAGNOSIS — Z79899 Other long term (current) drug therapy: Secondary | ICD-10-CM | POA: Diagnosis not present

## 2019-10-18 NOTE — Assessment & Plan Note (Addendum)
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  ER 90%, PR 90%, HER-2 negative by FISH T2N0 stage Ib  Pathology counseling: I discussed the final pathology report of the patient provided  a copy of this report. I discussed the margins as well as lymph node surgeries. We also discussed the final staging along with previously performed ER/PR and HER-2/neu testing.  Treatment plan: 1.  Oncotype DX 2. adjuvant radiation therapy 3.  Follow-up adjuvant antiestrogen therapy  Return to clinic based upon Oncotype DX test report

## 2019-10-19 ENCOUNTER — Telehealth: Payer: Self-pay | Admitting: Hematology and Oncology

## 2019-10-19 ENCOUNTER — Telehealth: Payer: Self-pay | Admitting: *Deleted

## 2019-10-19 NOTE — Telephone Encounter (Signed)
No 7/13 los, no changes made to pt schedule 

## 2019-10-19 NOTE — Telephone Encounter (Signed)
Received a request to call patient about some questions she has.  Left voicemail with contact info for her to call back.

## 2019-10-20 ENCOUNTER — Encounter: Payer: Self-pay | Admitting: *Deleted

## 2019-10-20 DIAGNOSIS — Z170 Estrogen receptor positive status [ER+]: Secondary | ICD-10-CM | POA: Diagnosis not present

## 2019-10-20 DIAGNOSIS — C50412 Malignant neoplasm of upper-outer quadrant of left female breast: Secondary | ICD-10-CM | POA: Diagnosis not present

## 2019-10-21 ENCOUNTER — Encounter: Payer: Self-pay | Admitting: *Deleted

## 2019-10-21 ENCOUNTER — Telehealth: Payer: Self-pay | Admitting: *Deleted

## 2019-10-21 NOTE — Telephone Encounter (Signed)
Received oncotype results of 32/20%.  Patient is aware and confirmed appointment for 10/25/19 with dr. Lindi Adie to discuss results.

## 2019-10-24 NOTE — Progress Notes (Signed)
Patient Care Team: Tonia Ghent, MD as PCP - General (Family Medicine) [name], RN as Registered Nurse [name], RN as Registered Nurse [name], RN as Oncology Nurse Navigator [name], RN as Oncology Nurse Navigator  DIAGNOSIS:    ICD-10-CM   1. Malignant neoplasm of upper-outer quadrant of left breast in female, estrogen receptor positive ([place])  C50.412    Z17.0     SUMMARY OF ONCOLOGIC HISTORY: Oncology History  Malignant neoplasm of upper-outer quadrant of left breast in female, estrogen receptor positive ([place])  08/25/2019 Initial Diagnosis   Palpable left breast mass x1 month. Diagnostic mammogram and [place] on 08/18/19 showed a 2.8cm mass at the 2 o'clock position, and one lymph node with mild cortical thickening in the left axilla. 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   10/18/2019 Cancer Staging   Staging form: Breast, AJCC 8th Edition - Pathologic stage from 10/18/2019: Stage IB (pT2, pN0(sn), cM0, G3, ER+, PR+, HER2-) - Signed by Nicholas Lose, MD on 10/18/2019   10/21/2019 Oncotype testing   32/20%     CHIEF COMPLIANT: Follow-up to discuss Oncotype results  INTERVAL HISTORY: Julia [name] is a 54 y.o. with above-mentioned history of left breast cancer who underwent a left lumpectomy. Oncotype testing showed a score of 32, with a 20% chance of distant recurrence in 9 years without systemic treatment. She presents to the clinic today to discuss the Oncotype report and further treatment.   ALLERGIES:  is allergic to propoxyphene n-acetaminophen, nabumetone, and rofecoxib.  MEDICATIONS:  Current Outpatient Medications  Medication Sig Dispense Refill  . albuterol  (PROVENTIL) (2.5 MG/3ML) 0.083% nebulizer solution Take 3 mLs (2.5 mg total) by nebulization every 6 (six) hours as needed for wheezing or shortness of breath. 75 mL 1  . albuterol (VENTOLIN HFA) 108 (90 Base) MCG/ACT inhaler Inhale 2 puffs into the lungs every 6 (six) hours as needed for wheezing or shortness of breath. 18 g 3  . diphenhydrAMINE HCl (ALLERGY MEDICATION PO) Take by mouth.    . EPINEPHrine (PRIMATENE MIST IN) Inhale 1 puff into the lungs every 6 (six) hours as needed (wheezing/shortness of breath.).    Marland Kitchen gabapentin (NEURONTIN) 300 MG capsule Take up to 4 tabs a day (2 tabs twice a day) (Patient taking differently: Take 300 mg by mouth in the morning, at noon, and at bedtime. )    . HYDROcodone-acetaminophen (NORCO) 5-325 MG tablet Take 1-2 tablets by mouth every 6 (six) hours as needed for severe pain. Sedation caution 20 tablet 0  . ibuprofen (ADVIL) 600 MG tablet Take 1 tablet (600 mg total) by mouth every 8 (eight) hours as needed (For pain.  Take with food.). 90 tablet 2  . omeprazole (PRILOSEC OTC) 20 MG tablet Take 20 mg by mouth daily.    Marland Kitchen oxyCODONE (OXY IR/ROXICODONE) 5 MG immediate release tablet Take 1-2 tablets (5-10 mg total) by mouth every 6 (six) hours as needed for moderate pain, severe pain or breakthrough pain. 15 tablet 0   No current facility-administered medications for this visit.    PHYSICAL EXAMINATION: ECOG PERFORMANCE STATUS: 1 - Symptomatic but completely ambulatory  There were no vitals filed for this  visit. There were no vitals filed for this visit.  LABORATORY DATA:  I have reviewed the data as listed CMP Latest Ref Rng & Units 07/18/2019 04/13/2017 07/03/2016  Glucose 70 - 99 mg/dL 115(H) 127(H) 131(H)  BUN 6 - 23 mg/dL '7 13 13  ' Creatinine 0.40 - 1.20 mg/dL 0.64 0.83 0.82  Sodium 135 - 145 mEq/L 137 139 139  Potassium 3.5 - 5.1 mEq/L 4.5 4.3 3.8  Chloride 96 - 112 mEq/L 104 106 103  CO2 19 - 32 mEq/L '26 28 24  ' Calcium 8.4 - 10.5 mg/dL 9.6 9.5  9.9  Total Protein 6.0 - 8.3 g/dL - 6.6 -  Total Bilirubin 0.2 - 1.2 mg/dL - 0.3 -  Alkaline Phos 39 - 117 U/L - 80 -  AST 0 - 37 U/L - 16 -  ALT 0 - 35 U/L - 23 -    Lab Results  Component Value Date   WBC 9.5 04/13/2017   HGB 14.4 04/13/2017   HCT 40.4 04/13/2017   MCV 93.8 04/13/2017   PLT 234.0 04/13/2017   NEUTROABS 5.0 04/13/2017    ASSESSMENT & PLAN:  Malignant neoplasm of upper-outer quadrant of left breast in female, estrogen receptor positive ([place]) 10/07/2019:Left lumpectomy and sentinel lymph node biopsy [name]): IDC with DCIS, 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  ER 90%, PR 90%, HER-2 negative by FISH Posterior margin positive but no further surgery done because it is muscle on the back. T2N0 stage Ib Oncotype DX score 32: 20% risk of distant recurrence in 9 years  Recommendation: Adjuvant chemotherapy with dose dense Adriamycin and Cytoxan x4 followed by Taxol weekly x12 I discussed with her the risks and benefits of chemotherapy including the risk of nausea vomiting hair loss lowering of blood counts risk of infection, neuropathy, cardiomyopathy from Adriamycin and even leukemia from chemo.  Patient is very sad to hear that she needs chemotherapy and will think about it and inform us of her decision.  Patient is very emotional about the topic of chemotherapy. She works for [employer] and needs to know when she can return to work.  Return to clinic based upon her decision for chemo.    No orders of the defined types were placed in this encounter.  The patient has a good understanding of the overall plan. she agrees with it. she will call with any problems that may develop before the next visit here.  Total time spent: 30 mins including face to face time and time spent for planning, charting and coordination of care  Nicholas Lose, MD 10/25/2019  I, Cloyde Reams Dorshimer, am acting as scribe for Dr. Nicholas Lose.  I have reviewed  the above documentation for accuracy and completeness, and I agree with the above.

## 2019-10-25 ENCOUNTER — Inpatient Hospital Stay (HOSPITAL_BASED_OUTPATIENT_CLINIC_OR_DEPARTMENT_OTHER): Payer: Medicaid Other | Admitting: Hematology and Oncology

## 2019-10-25 ENCOUNTER — Encounter: Payer: Self-pay | Admitting: *Deleted

## 2019-10-25 ENCOUNTER — Encounter: Payer: Self-pay | Admitting: Hematology and Oncology

## 2019-10-25 ENCOUNTER — Encounter (HOSPITAL_COMMUNITY): Payer: Self-pay | Admitting: Hematology and Oncology

## 2019-10-25 ENCOUNTER — Other Ambulatory Visit: Payer: Self-pay

## 2019-10-25 DIAGNOSIS — Z170 Estrogen receptor positive status [ER+]: Secondary | ICD-10-CM | POA: Diagnosis not present

## 2019-10-25 DIAGNOSIS — Z79899 Other long term (current) drug therapy: Secondary | ICD-10-CM | POA: Diagnosis not present

## 2019-10-25 DIAGNOSIS — C50412 Malignant neoplasm of upper-outer quadrant of left female breast: Secondary | ICD-10-CM

## 2019-10-25 DIAGNOSIS — R599 Enlarged lymph nodes, unspecified: Secondary | ICD-10-CM | POA: Diagnosis not present

## 2019-10-25 NOTE — Assessment & Plan Note (Signed)
10/07/2019:Left lumpectomy and sentinel lymph node biopsy [name]): IDC with DCIS, 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  ER 90%, PR 90%, HER-2 negative by FISH Posterior margin positive but no further surgery done because it is muscle on the back. T2N0 stage Ib   Patient is very emotional about the topic of chemotherapy. She works for [employer] and needs to know when she can return to work.

## 2019-10-26 ENCOUNTER — Telehealth: Payer: Self-pay | Admitting: Hematology and Oncology

## 2019-10-26 NOTE — Telephone Encounter (Signed)
No 7/20 los, no changes made to pt schedule  

## 2019-10-27 ENCOUNTER — Telehealth: Payer: Self-pay | Admitting: Family Medicine

## 2019-10-27 NOTE — Telephone Encounter (Signed)
[facility] sent a fax on 10/19/19 requesting rx for Ventolin be replaced with Proair because Medicaid requires Proair.

## 2019-10-27 NOTE — Telephone Encounter (Signed)
Spoke with [employer] and given authorization for [employer].

## 2019-10-31 ENCOUNTER — Telehealth: Payer: Self-pay | Admitting: *Deleted

## 2019-10-31 ENCOUNTER — Encounter: Payer: Self-pay | Admitting: *Deleted

## 2019-10-31 ENCOUNTER — Other Ambulatory Visit: Payer: Self-pay | Admitting: *Deleted

## 2019-10-31 ENCOUNTER — Other Ambulatory Visit: Payer: Self-pay | Admitting: Hematology and Oncology

## 2019-10-31 DIAGNOSIS — C50412 Malignant neoplasm of upper-outer quadrant of left female breast: Secondary | ICD-10-CM

## 2019-10-31 DIAGNOSIS — Z170 Estrogen receptor positive status [ER+]: Secondary | ICD-10-CM

## 2019-10-31 MED ORDER — LIDOCAINE-PRILOCAINE 2.5-2.5 % EX CREA: TOPICAL_CREAM | CUTANEOUS | 3 refills | Status: DC

## 2019-10-31 MED ORDER — PROCHLORPERAZINE MALEATE 10 MG PO TABS: 10.0000 mg | ORAL_TABLET | Freq: Four times a day (QID) | ORAL | 1 refills | Status: DC | PRN

## 2019-10-31 MED ORDER — ONDANSETRON HCL 8 MG PO TABS: 8.0000 mg | ORAL_TABLET | Freq: Two times a day (BID) | ORAL | 1 refills | Status: DC | PRN

## 2019-10-31 MED ORDER — LORAZEPAM 0.5 MG PO TABS: 0.5000 mg | ORAL_TABLET | Freq: Every evening | ORAL | 0 refills | Status: DC | PRN

## 2019-10-31 MED ORDER — DEXAMETHASONE 4 MG PO TABS: 4.0000 mg | ORAL_TABLET | Freq: Every day | ORAL | 0 refills | Status: DC

## 2019-10-31 NOTE — Progress Notes (Signed)
START ON PATHWAY REGIMEN - Breast     Cycles 1 through 4: A cycle is every 14 days:     Doxorubicin      Cyclophosphamide      Pegfilgrastim-xxxx    Cycles 5 through 16: A cycle is every 7 days:     Paclitaxel   **Always confirm dose/schedule in your pharmacy ordering system**  Patient Characteristics: Postoperative without Neoadjuvant Therapy (Pathologic Staging), Invasive Disease, Adjuvant Therapy, HER2 Negative/Unknown/Equivocal, ER Positive, Node Negative, pT1a-c, pN0/N84m or pT2 or Higher, pN0, Oncotype High Risk (? 26) Therapeutic Status: Postoperative without Neoadjuvant Therapy (Pathologic Staging) AJCC Grade: G3 AJCC N Category: pN0 AJCC M Category: cM0 ER Status: Positive (+) AJCC 8 Stage Grouping: IB HER2 Status: Negative (-) Oncotype Dx Recurrence Score: 33 AJCC T Category: pT2 PR Status: Positive (+) Has this patient completed genomic testing<= Yes - Oncotype DX(R) Intent of Therapy: Curative Intent, Discussed with Patient

## 2019-10-31 NOTE — Telephone Encounter (Signed)
Spoke with patient to see if she had made a decision regarding treatment.  She states she is going to move forward with chemo.  Echo and chemo education scheduled for 8/2 and patient is aware of date and time.  Patient will be out of town for the rest of this week and would like to start chemo 8/9 or 8/10.  Schedule message sent. Msg sent for port placement.  Informed her that if Dr.Toth was unavailable to place port we will do by IR. Patient verbalized understanding.

## 2019-11-01 ENCOUNTER — Ambulatory Visit: Payer: Self-pay | Admitting: General Surgery

## 2019-11-01 ENCOUNTER — Telehealth: Payer: Self-pay | Admitting: Hematology and Oncology

## 2019-11-01 NOTE — Telephone Encounter (Signed)
Scheduled appt per 7/26 sch msg - pt to get an updated schedule at chemo edu

## 2019-11-02 ENCOUNTER — Encounter: Payer: Self-pay | Admitting: *Deleted

## 2019-11-03 ENCOUNTER — Encounter (HOSPITAL_COMMUNITY): Payer: Self-pay | Admitting: General Surgery

## 2019-11-03 ENCOUNTER — Other Ambulatory Visit: Payer: Self-pay

## 2019-11-07 ENCOUNTER — Other Ambulatory Visit (HOSPITAL_COMMUNITY)
Admission: RE | Admit: 2019-11-07 | Discharge: 2019-11-07 | Disposition: A | Discharge status: Home / Self Care | Payer: Medicaid Other | Source: Ambulatory Visit | Attending: General Surgery | Admitting: General Surgery

## 2019-11-07 ENCOUNTER — Ambulatory Visit (HOSPITAL_COMMUNITY)
Admission: RE | Admit: 2019-11-07 | Discharge: 2019-11-07 | Disposition: A | Discharge status: Home / Self Care | Payer: Medicaid Other | Source: Ambulatory Visit | Attending: Hematology and Oncology | Admitting: Hematology and Oncology

## 2019-11-07 ENCOUNTER — Other Ambulatory Visit: Payer: Self-pay

## 2019-11-07 ENCOUNTER — Inpatient Hospital Stay: Payer: Medicaid Other | Attending: Hematology and Oncology

## 2019-11-07 DIAGNOSIS — C50412 Malignant neoplasm of upper-outer quadrant of left female breast: Secondary | ICD-10-CM | POA: Insufficient documentation

## 2019-11-07 DIAGNOSIS — Z5111 Encounter for antineoplastic chemotherapy: Secondary | ICD-10-CM | POA: Insufficient documentation

## 2019-11-07 DIAGNOSIS — Z170 Estrogen receptor positive status [ER+]: Secondary | ICD-10-CM | POA: Insufficient documentation

## 2019-11-07 DIAGNOSIS — Z5189 Encounter for other specified aftercare: Secondary | ICD-10-CM | POA: Insufficient documentation

## 2019-11-07 DIAGNOSIS — Z01818 Encounter for other preprocedural examination: Secondary | ICD-10-CM | POA: Insufficient documentation

## 2019-11-07 DIAGNOSIS — E785 Hyperlipidemia, unspecified: Secondary | ICD-10-CM | POA: Insufficient documentation

## 2019-11-07 DIAGNOSIS — J449 Chronic obstructive pulmonary disease, unspecified: Secondary | ICD-10-CM | POA: Diagnosis not present

## 2019-11-07 DIAGNOSIS — Z20822 Contact with and (suspected) exposure to covid-19: Secondary | ICD-10-CM | POA: Insufficient documentation

## 2019-11-07 DIAGNOSIS — Z79899 Other long term (current) drug therapy: Secondary | ICD-10-CM | POA: Insufficient documentation

## 2019-11-07 DIAGNOSIS — Z923 Personal history of irradiation: Secondary | ICD-10-CM | POA: Insufficient documentation

## 2019-11-07 LAB — ECHOCARDIOGRAM COMPLETE
Area-P 1/2: 3.85 cm2
S' Lateral: 3.4 cm

## 2019-11-07 LAB — SARS CORONAVIRUS 2 (TAT 6-24 HRS): SARS Coronavirus 2: NEGATIVE

## 2019-11-07 NOTE — Progress Notes (Signed)
Echocardiogram 2D Echocardiogram has been performed.  Oneal Deputy Jaimey Franchini 11/07/2019, 11:41 AM

## 2019-11-09 NOTE — Anesthesia Preprocedure Evaluation (Addendum)
Anesthesia Evaluation  Patient identified by MRN, date of birth, ID band Patient awake    Reviewed: Allergy & Precautions, NPO status , Patient's Chart, lab work & pertinent test results  Airway Mallampati: II  TM Distance: >3 FB Neck ROM: Full    Dental  (+) Poor Dentition, Dental Advisory Given,    Pulmonary asthma , COPD,  COPD inhaler, Current Smoker,  Still smoking, 1.5ppd x 40 years  Albuterol- needs it daily Does not have pulmonologist    Pulmonary exam normal breath sounds clear to auscultation       Cardiovascular negative cardio ROS Normal cardiovascular exam Rhythm:Regular Rate:Normal     Neuro/Psych  Headaches, PSYCHIATRIC DISORDERS Anxiety    GI/Hepatic Neg liver ROS, GERD  Medicated and Controlled,  Endo/Other  negative endocrine ROS  Renal/GU negative Renal ROS  negative genitourinary   Musculoskeletal Chronic LBP   Abdominal Normal abdominal exam  (+)   Peds  Hematology negative hematology ROS (+)   Anesthesia Other Findings Left breast ca  Reproductive/Obstetrics negative OB ROS                            Anesthesia Physical Anesthesia Plan  ASA: III  Anesthesia Plan: General   Post-op Pain Management:    Induction: Intravenous  PONV Risk Score and Plan: 2 and Ondansetron, Dexamethasone, Midazolam and Treatment may vary due to age or medical condition  Airway Management Planned: LMA  Additional Equipment: None  Intra-op Plan:   Post-operative Plan: Extubation in OR  Informed Consent: I have reviewed the patients History and Physical, chart, labs and discussed the procedure including the risks, benefits and alternatives for the proposed anesthesia with the patient or authorized representative who has indicated his/her understanding and acceptance.     Dental advisory given  Plan Discussed with: CRNA  Anesthesia Plan Comments:        Anesthesia  Quick Evaluation

## 2019-11-09 NOTE — Progress Notes (Signed)
Pharmacist Chemotherapy Monitoring - Initial Assessment    Anticipated start date: 11/15/19  Regimen:  . Are orders appropriate based on the patient's diagnosis, regimen, and cycle? Yes . Does the plan date match the patient's scheduled date? Yes . Is the sequencing of drugs appropriate? Yes . Are the premedications appropriate for the patient's regimen? Yes . Prior Authorization for treatment is: Approved o If applicable, is the correct biosimilar selected based on the patient's insurance? yes  Organ Function and Labs: Marland Kitchen Are dose adjustments needed based on the patient's renal function, hepatic function, or hematologic function? Yes . Are appropriate labs ordered prior to the start of patient's treatment? Yes . Other organ system assessment, if indicated: anthracyclines: Echo/ MUGA . The following baseline labs, if indicated, have been ordered: N/A  Dose Assessment: . Are the drug doses appropriate? Yes . Are the following correct: o Drug concentrations Yes o IV fluid compatible with drug Yes o Administration routes Yes o Timing of therapy Yes . If applicable, does the patient have documented access for treatment and/or plans for port-a-cath placement? yes . If applicable, have lifetime cumulative doses been properly documented and assessed? yes Lifetime Dose Tracking  No doses have been documented on this patient for the following tracked chemicals: Doxorubicin, Epirubicin, Idarubicin, Daunorubicin, Mitoxantrone, Bleomycin, Oxaliplatin, Carboplatin, Liposomal Doxorubicin  o   Toxicity Monitoring/Prevention: . The patient has the following take home antiemetics prescribed: Prochlorperazine . The patient has the following take home medications prescribed: N/A . Medication allergies and previous infusion related reactions, if applicable, have been reviewed and addressed. Yes . The patient's current medication list has been assessed for drug-drug interactions with their chemotherapy  regimen. no significant drug-drug interactions were identified on review.  Order Review: . Are the treatment plan orders signed? Yes . Is the patient scheduled to see a provider prior to their treatment? Yes  I verify that I have reviewed each item in the above checklist and answered each question accordingly.  Philomena Course 11/09/2019 3:16 PM

## 2019-11-10 ENCOUNTER — Ambulatory Visit (HOSPITAL_COMMUNITY): Payer: Medicaid Other | Admitting: Anesthesiology

## 2019-11-10 ENCOUNTER — Ambulatory Visit (HOSPITAL_COMMUNITY): Payer: Medicaid Other

## 2019-11-10 ENCOUNTER — Encounter (HOSPITAL_COMMUNITY): Payer: Self-pay | Admitting: General Surgery

## 2019-11-10 ENCOUNTER — Encounter (HOSPITAL_COMMUNITY)
Admission: RE | Disposition: A | Discharge status: Home / Self Care | Payer: Self-pay | Source: Home / Self Care | Attending: General Surgery

## 2019-11-10 ENCOUNTER — Ambulatory Visit (HOSPITAL_COMMUNITY)
Admission: RE | Admit: 2019-11-10 | Discharge: 2019-11-10 | Disposition: A | Discharge status: Home / Self Care | Payer: Medicaid Other | Attending: General Surgery | Admitting: General Surgery

## 2019-11-10 DIAGNOSIS — Z79899 Other long term (current) drug therapy: Secondary | ICD-10-CM | POA: Diagnosis not present

## 2019-11-10 DIAGNOSIS — K219 Gastro-esophageal reflux disease without esophagitis: Secondary | ICD-10-CM | POA: Diagnosis not present

## 2019-11-10 DIAGNOSIS — Z803 Family history of malignant neoplasm of breast: Secondary | ICD-10-CM | POA: Insufficient documentation

## 2019-11-10 DIAGNOSIS — Z170 Estrogen receptor positive status [ER+]: Secondary | ICD-10-CM | POA: Insufficient documentation

## 2019-11-10 DIAGNOSIS — F17210 Nicotine dependence, cigarettes, uncomplicated: Secondary | ICD-10-CM | POA: Diagnosis not present

## 2019-11-10 DIAGNOSIS — C50412 Malignant neoplasm of upper-outer quadrant of left female breast: Secondary | ICD-10-CM | POA: Insufficient documentation

## 2019-11-10 DIAGNOSIS — I517 Cardiomegaly: Secondary | ICD-10-CM | POA: Diagnosis not present

## 2019-11-10 DIAGNOSIS — Z95828 Presence of other vascular implants and grafts: Secondary | ICD-10-CM

## 2019-11-10 DIAGNOSIS — Z800 Family history of malignant neoplasm of digestive organs: Secondary | ICD-10-CM | POA: Insufficient documentation

## 2019-11-10 DIAGNOSIS — J449 Chronic obstructive pulmonary disease, unspecified: Secondary | ICD-10-CM | POA: Diagnosis not present

## 2019-11-10 HISTORY — PX: PORTACATH PLACEMENT: SHX2246

## 2019-11-10 HISTORY — DX: Pneumonia, unspecified organism: J18.9

## 2019-11-10 HISTORY — DX: Unspecified asthma, uncomplicated: J45.909

## 2019-11-10 LAB — CBC
HCT: 42.7 % (ref 36.0–46.0)
Hemoglobin: 15 g/dL (ref 12.0–15.0)
MCH: 32.8 pg (ref 26.0–34.0)
MCHC: 35.1 g/dL (ref 30.0–36.0)
MCV: 93.4 fL (ref 80.0–100.0)
Platelets: 240 10*3/uL (ref 150–400)
RBC: 4.57 MIL/uL (ref 3.87–5.11)
RDW: 12.5 % (ref 11.5–15.5)
WBC: 11.9 10*3/uL — ABNORMAL HIGH (ref 4.0–10.5)
nRBC: 0 % (ref 0.0–0.2)

## 2019-11-10 SURGERY — INSERTION, TUNNELED CENTRAL VENOUS DEVICE, WITH PORT
Anesthesia: General | Site: Chest | Laterality: Right

## 2019-11-10 MED ORDER — BUPIVACAINE-EPINEPHRINE (PF) 0.25% -1:200000 IJ SOLN
INTRAMUSCULAR | Status: AC
  Filled 2019-11-10: qty 30

## 2019-11-10 MED ORDER — DEXAMETHASONE SODIUM PHOSPHATE 10 MG/ML IJ SOLN
INTRAMUSCULAR | Status: DC | PRN
  Administered 2019-11-10: 5 mg via INTRAVENOUS

## 2019-11-10 MED ORDER — BUPIVACAINE-EPINEPHRINE (PF) 0.25% -1:200000 IJ SOLN
INTRAMUSCULAR | Status: DC | PRN
  Administered 2019-11-10: 10 mL

## 2019-11-10 MED ORDER — HEPARIN SOD (PORK) LOCK FLUSH 100 UNIT/ML IV SOLN
INTRAVENOUS | Status: DC | PRN
  Administered 2019-11-10: 500 [IU]

## 2019-11-10 MED ORDER — FENTANYL CITRATE (PF) 100 MCG/2ML IJ SOLN
INTRAMUSCULAR | Status: AC
  Filled 2019-11-10: qty 2

## 2019-11-10 MED ORDER — OXYCODONE HCL 5 MG PO TABS: 5.0000 mg | ORAL_TABLET | Freq: Once | ORAL | Status: DC | PRN

## 2019-11-10 MED ORDER — GABAPENTIN 300 MG PO CAPS
300.0000 mg | ORAL_CAPSULE | ORAL | Status: DC
  Filled 2019-11-10: qty 1

## 2019-11-10 MED ORDER — CEFAZOLIN SODIUM-DEXTROSE 2-4 GM/100ML-% IV SOLN
2.0000 g | INTRAVENOUS | Status: AC
  Administered 2019-11-10: 2 g via INTRAVENOUS
  Filled 2019-11-10: qty 100

## 2019-11-10 MED ORDER — PROPOFOL 10 MG/ML IV BOLUS
INTRAVENOUS | Status: DC | PRN
  Administered 2019-11-10 (×2): 150 mg via INTRAVENOUS

## 2019-11-10 MED ORDER — ROCURONIUM BROMIDE 10 MG/ML (PF) SYRINGE
PREFILLED_SYRINGE | INTRAVENOUS | Status: AC
  Filled 2019-11-10: qty 10

## 2019-11-10 MED ORDER — MIDAZOLAM HCL 2 MG/2ML IJ SOLN
INTRAMUSCULAR | Status: AC
  Filled 2019-11-10: qty 2

## 2019-11-10 MED ORDER — CHLORHEXIDINE GLUCONATE 0.12 % MT SOLN
15.0000 mL | Freq: Once | OROMUCOSAL | Status: AC
  Administered 2019-11-10: 15 mL via OROMUCOSAL

## 2019-11-10 MED ORDER — HYDROCODONE-ACETAMINOPHEN 5-325 MG PO TABS: 1.0000 | ORAL_TABLET | Freq: Four times a day (QID) | ORAL | 0 refills | Status: DC | PRN

## 2019-11-10 MED ORDER — DEXAMETHASONE SODIUM PHOSPHATE 10 MG/ML IJ SOLN
INTRAMUSCULAR | Status: AC
  Filled 2019-11-10: qty 1

## 2019-11-10 MED ORDER — LACTATED RINGERS IV SOLN: INTRAVENOUS | Status: DC

## 2019-11-10 MED ORDER — SODIUM CHLORIDE 0.9 % IV SOLN
Freq: Once | INTRAVENOUS | Status: AC
  Administered 2019-11-10: 10:00:00 20 mL
  Filled 2019-11-10: qty 1.2

## 2019-11-10 MED ORDER — LIDOCAINE 2% (20 MG/ML) 5 ML SYRINGE
INTRAMUSCULAR | Status: AC
  Filled 2019-11-10: qty 5

## 2019-11-10 MED ORDER — CHLORHEXIDINE GLUCONATE CLOTH 2 % EX PADS: 6.0000 | MEDICATED_PAD | Freq: Once | CUTANEOUS | Status: DC

## 2019-11-10 MED ORDER — FENTANYL CITRATE (PF) 100 MCG/2ML IJ SOLN
25.0000 ug | INTRAMUSCULAR | Status: DC | PRN
  Administered 2019-11-10 (×2): 50 ug via INTRAVENOUS

## 2019-11-10 MED ORDER — ORAL CARE MOUTH RINSE: 15.0000 mL | Freq: Once | OROMUCOSAL | Status: AC

## 2019-11-10 MED ORDER — SODIUM CHLORIDE 0.9 % IR SOLN
Status: DC | PRN
  Administered 2019-11-10: 1000 mL

## 2019-11-10 MED ORDER — MIDAZOLAM HCL 2 MG/2ML IJ SOLN
INTRAMUSCULAR | Status: DC | PRN
  Administered 2019-11-10: 2 mg via INTRAVENOUS

## 2019-11-10 MED ORDER — PHENYLEPHRINE HCL (PRESSORS) 10 MG/ML IV SOLN
INTRAVENOUS | Status: DC | PRN
Start: 2019-11-10 — End: 2019-11-10
  Administered 2019-11-10: 120 ug via INTRAVENOUS
  Administered 2019-11-10 (×3): 80 ug via INTRAVENOUS

## 2019-11-10 MED ORDER — LIDOCAINE 2% (20 MG/ML) 5 ML SYRINGE
INTRAMUSCULAR | Status: DC | PRN
  Administered 2019-11-10 (×2): 60 mg via INTRAVENOUS

## 2019-11-10 MED ORDER — ACETAMINOPHEN 500 MG PO TABS
1000.0000 mg | ORAL_TABLET | Freq: Once | ORAL | Status: AC
  Administered 2019-11-10: 1000 mg via ORAL
  Filled 2019-11-10: qty 2

## 2019-11-10 MED ORDER — ONDANSETRON HCL 4 MG/2ML IJ SOLN
INTRAMUSCULAR | Status: AC
  Filled 2019-11-10: qty 2

## 2019-11-10 MED ORDER — ONDANSETRON HCL 4 MG/2ML IJ SOLN
INTRAMUSCULAR | Status: DC | PRN
  Administered 2019-11-10: 4 mg via INTRAVENOUS

## 2019-11-10 MED ORDER — ONDANSETRON HCL 4 MG/2ML IJ SOLN: 4.0000 mg | Freq: Once | INTRAMUSCULAR | Status: DC | PRN

## 2019-11-10 MED ORDER — OXYCODONE HCL 5 MG/5ML PO SOLN: 5.0000 mg | Freq: Once | ORAL | Status: DC | PRN

## 2019-11-10 SURGICAL SUPPLY — 43 items
ADH SKN CLS APL DERMABOND .7 (GAUZE/BANDAGES/DRESSINGS) ×1
APL SKNCLS STERI-STRIP NONHPOA (GAUZE/BANDAGES/DRESSINGS)
BAG DECANTER FOR FLEXI CONT (MISCELLANEOUS) ×3 IMPLANT
BENZOIN TINCTURE PRP APPL 2/3 (GAUZE/BANDAGES/DRESSINGS) IMPLANT
BLADE HEX COATED 2.75 (ELECTRODE) ×3 IMPLANT
BLADE SURG 15 STRL LF DISP TIS (BLADE) ×1 IMPLANT
BLADE SURG 15 STRL SS (BLADE) ×3
CLOSURE WOUND 1/2 X4 (GAUZE/BANDAGES/DRESSINGS)
COVER WAND RF STERILE (DRAPES) IMPLANT
DECANTER SPIKE VIAL GLASS SM (MISCELLANEOUS) ×3 IMPLANT
DERMABOND ADVANCED (GAUZE/BANDAGES/DRESSINGS) ×2
DERMABOND ADVANCED .7 DNX12 (GAUZE/BANDAGES/DRESSINGS) IMPLANT
DRAPE C-ARM 42X120 X-RAY (DRAPES) ×3 IMPLANT
DRAPE LAPAROSCOPIC ABDOMINAL (DRAPES) ×3 IMPLANT
DRSG TEGADERM 2-3/8X2-3/4 SM (GAUZE/BANDAGES/DRESSINGS) IMPLANT
DRSG TEGADERM 4X4.75 (GAUZE/BANDAGES/DRESSINGS) IMPLANT
ELECT REM PT RETURN 15FT ADLT (MISCELLANEOUS) ×3 IMPLANT
GAUZE 4X4 16PLY RFD (DISPOSABLE) ×3 IMPLANT
GAUZE SPONGE 4X4 12PLY STRL (GAUZE/BANDAGES/DRESSINGS) IMPLANT
GLOVE BIO SURGEON STRL SZ7.5 (GLOVE) ×6 IMPLANT
GLOVE BIOGEL PI IND STRL 7.0 (GLOVE) ×1 IMPLANT
GLOVE BIOGEL PI INDICATOR 7.0 (GLOVE) ×2
GOWN STRL REUS W/ TWL XL LVL3 (GOWN DISPOSABLE) ×1 IMPLANT
GOWN STRL REUS W/TWL LRG LVL3 (GOWN DISPOSABLE) ×3 IMPLANT
GOWN STRL REUS W/TWL XL LVL3 (GOWN DISPOSABLE) ×6 IMPLANT
KIT BASIN OR (CUSTOM PROCEDURE TRAY) ×3 IMPLANT
KIT PORT POWER 8FR ISP CVUE (Port) ×2 IMPLANT
KIT TURNOVER KIT A (KITS) IMPLANT
NDL HYPO 25X1 1.5 SAFETY (NEEDLE) ×1 IMPLANT
NEEDLE HYPO 25X1 1.5 SAFETY (NEEDLE) ×3 IMPLANT
NS IRRIG 1000ML POUR BTL (IV SOLUTION) ×3 IMPLANT
PACK BASIC VI WITH GOWN DISP (CUSTOM PROCEDURE TRAY) ×3 IMPLANT
PENCIL SMOKE EVACUATOR (MISCELLANEOUS) IMPLANT
STRIP CLOSURE SKIN 1/2X4 (GAUZE/BANDAGES/DRESSINGS) IMPLANT
SUT MNCRL AB 4-0 PS2 18 (SUTURE) ×3 IMPLANT
SUT PROLENE 2 0 SH DA (SUTURE) ×3 IMPLANT
SUT SILK 2 0 (SUTURE)
SUT SILK 2-0 30XBRD TIE 12 (SUTURE) IMPLANT
SUT VIC AB 3-0 SH 27 (SUTURE) ×3
SUT VIC AB 3-0 SH 27XBRD (SUTURE) ×1 IMPLANT
SYR 10ML LL (SYRINGE) ×3 IMPLANT
SYR CONTROL 10ML LL (SYRINGE) ×3 IMPLANT
TOWEL OR 17X26 10 PK STRL BLUE (TOWEL DISPOSABLE) ×3 IMPLANT

## 2019-11-10 NOTE — Op Note (Signed)
11/10/2019  10:12 AM  PATIENT:  Julia [name]  54 y.o. female  PRE-OPERATIVE DIAGNOSIS:  LEFT BREAST CANCER  POST-OPERATIVE DIAGNOSIS:  LEFT BREAST CANCER  PROCEDURE:  Procedure(s): INSERTION PORT-A-CATH (Right Subclavian Vein)  SURGEON:  Surgeon(s) and Role:    * Jovita Kussmaul, MD - Primary  PHYSICIAN ASSISTANT:   ASSISTANTS: none   ANESTHESIA:   local and general  EBL:  minimal   BLOOD ADMINISTERED:none  DRAINS: none   LOCAL MEDICATIONS USED:  MARCAINE     SPECIMEN:  No Specimen  DISPOSITION OF SPECIMEN:  N/A  COUNTS:  YES  TOURNIQUET:  * No tourniquets in log *  DICTATION: .Dragon Dictation   After informed consent was obtained the patient was brought to the operating room and placed in the supine position on the operating table.  After adequate induction of general anesthesia a roll was placed between the patient's shoulder blades to extend the shoulder slightly.  The right chest and neck area were then prepped with ChloraPrep, allowed to dry, and draped in usual sterile manner.  An appropriate timeout was performed.  The patient was placed in Trendelenburg position.  The area lateral to the bend of the clavicle on the right chest wall was infiltrated with quarter percent Marcaine.  A large bore needle from the Port-A-Cath kit was used to slide beneath the bend of the clavicle heading towards the sternal notch and in doing so I was able to access the right subclavian vein without difficulty.  The wire was fed through the needle using the Seldinger technique without difficulty.  The wire was confirmed in the central venous system using real-time fluoroscopy.  Next a small incision was made at the wire entry site with a 15 blade knife.  The incision was carried through the skin and subcutaneous tissue sharply with the electrocautery.  A subcutaneous pocket was created inferior to the incision by blunt finger dissection.  Next the tubing was placed on the reservoir.  The  reservoir was placed in the pocket and the length of the tubing was estimated using real-time fluoroscopy.  The tubing was cut to the appropriate length.  Next a sheath and dilator were fed over the wire using the Seldinger technique without difficulty.  The dilator and wire were removed from the patient.  The tubing was fed through the sheath as far as it would go and then held in place while the sheath was gently cracked and separated.  Another real-time fluoroscopy image showed the tip of the catheter to be in the distal superior vena cava.  The tubing was then permanently anchored to the reservoir.  The reservoir was anchored in the pocket with two 2-0 Prolene stitches.  The port was then aspirated and it aspirated blood easily.  The port was then flushed initially with a dilute heparin solution and then with a more concentrated heparin solution.  The subcutaneous tissue was then closed over the port with interrupted 3-0 Vicryl stitches.  The skin was closed with a running 4-0 Monocryl subcuticular stitch.  Dermabond dressings were applied.  The patient tolerated the procedure well.  At the end of the case all needle sponge and instrument counts were correct.  The patient was then awakened and taken to recovery in stable condition.  PLAN OF CARE: Discharge to home after PACU  PATIENT DISPOSITION:  PACU - hemodynamically stable.   Delay start of Pharmacological VTE agent (>24hrs) due to surgical blood loss or risk of bleeding: not applicable

## 2019-11-10 NOTE — H&P (Signed)
Julia [name]  Location: [facility] Patient #: [id number] DOB: 1966/02/27 Married / Language: Undefined / Race: Refused to Report/Unreported Female   History of Present Illness  The patient is a 54 year old female who presents with breast cancer. we are asked to see the patient in consultation by Dr. Lindi Adie to evaluate her for a new left breast cancer. The patient is a 54 year old white female who recently felt a mass in the upper outer quadrant of the left breast. She was evaluated with mammogram and ultrasound and found to have a 2.8 cm mass in the upper outer quadrant. There was one abnormal looking lymph node but the biopsy on this was benign. 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 This is being restudied. She otherwise has very few medical problems but she does smoke about 2 packs of cigarettes a day. She has a history of breast cancer in a maternal aunt and colon cancer in an uncle.   Past Surgical History Breast Biopsy  Left. Breast Mass; Local Excision  Left.  Diagnostic Studies History  Colonoscopy  never Pap Smear  never  Allergies  No Known Drug Allergies    Medication History  Ibuprofen (600MG Tablet, Oral) Active. Gabapentin (300MG Capsule, Oral) Active. Medications Reconciled  Social History  Alcohol use  Remotely quit alcohol use. Caffeine use  Coffee, Tea. No drug use  Tobacco use  Current some day smoker.  Family History  Alcohol Abuse  Father, Sister. Breast Cancer  Family Members In General. Heart Disease  Father, Mother. Hypertension  Father, Mother.  Pregnancy / Birth History  Age at menarche  89 years. Age of menopause  <44 Gravida  1 Irregular periods  Maternal age  79-20 Para  1  Other Problems  No pertinent past medical history     Review of Systems General Not Present- Appetite Loss, Chills, Fatigue, Fever, Night  Sweats, Weight Gain and Weight Loss. Skin Not Present- Change in Wart/Mole, Dryness, Hives, Jaundice, New Lesions, Non-Healing Wounds, Rash and Ulcer. HEENT Not Present- Earache, Hearing Loss, Hoarseness, Nose Bleed, Oral Ulcers, Ringing in the Ears, Seasonal Allergies, Sinus Pain, Sore Throat, Visual Disturbances, Wears glasses/contact lenses and Yellow Eyes. Respiratory Not Present- Bloody sputum, Chronic Cough, Difficulty Breathing, Snoring and Wheezing. Breast Present- Breast Mass. Not Present- Breast Pain, Nipple Discharge and Skin Changes. Cardiovascular Not Present- Chest Pain, Difficulty Breathing Lying Down, Leg Cramps, Palpitations, Rapid Heart Rate, Shortness of Breath and Swelling of Extremities. Gastrointestinal Not Present- Abdominal Pain, Bloating, Bloody Stool, Change in Bowel Habits, Chronic diarrhea, Constipation, Difficulty Swallowing, Excessive gas, Gets full quickly at meals, Hemorrhoids, Indigestion, Nausea, Rectal Pain and Vomiting. Musculoskeletal Present- Back Pain. Not Present- Joint Pain, Joint Stiffness, Muscle Pain, Muscle Weakness and Swelling of Extremities. Neurological Not Present- Decreased Memory, Fainting, Headaches, Numbness, Seizures, Tingling, Tremor, Trouble walking and Weakness. Psychiatric Not Present- Anxiety, Bipolar, Change in Sleep Pattern, Depression, Fearful and Frequent crying. Endocrine Not Present- Cold Intolerance, Excessive Hunger, Hair Changes, Heat Intolerance, Hot flashes and New Diabetes. Hematology Not Present- Blood Thinners, Easy Bruising, Excessive bleeding, Gland problems, HIV and Persistent Infections.  Vitals  Weight: 158 lb Height: 64in Body Surface Area: 1.77 m Body Mass Index: 27.12 kg/m  Pulse: 83 (Regular)        Physical Exam  General Mental Status-Alert. General Appearance-Consistent with stated age. Hydration-Well hydrated. Voice-Normal.  Head and Neck Head-normocephalic, atraumatic with no  lesions or palpable masses. Trachea-midline. Thyroid Gland Characteristics - normal size and consistency.  Eye Eyeball - Bilateral-Extraocular movements intact. Sclera/Conjunctiva - Bilateral-No scleral icterus.  Chest and Lung Exam Chest and lung exam reveals -quiet, even and easy respiratory effort with no use of accessory muscles and on auscultation, normal breath sounds, no adventitious sounds and normal vocal resonance. Inspection Chest Wall - Normal. Back - normal.  Breast Note: there is a 3-4 cm palpable mass in the upper outer quadrant of the left breast. This seems mobile and not tethered to the chest wall. There are no overlying skin changes. There is no other palpable mass in either breast. There is no palpable axillary, supraclavicular, or cervical lymphadenopathy.   Cardiovascular Cardiovascular examination reveals -normal heart sounds, regular rate and rhythm with no murmurs and normal pedal pulses bilaterally.  Abdomen Inspection Inspection of the abdomen reveals - No Hernias. Skin - Scar - no surgical scars. Palpation/Percussion Palpation and Percussion of the abdomen reveal - Soft, Non Tender, No Rebound tenderness, No Rigidity (guarding) and No hepatosplenomegaly. Auscultation Auscultation of the abdomen reveals - Bowel sounds normal.  Neurologic Neurologic evaluation reveals -alert and oriented x 3 with no impairment of recent or remote memory. Mental Status-Normal.  Musculoskeletal Normal Exam - Left-Upper Extremity Strength Normal and Lower Extremity Strength Normal. Normal Exam - Right-Upper Extremity Strength Normal and Lower Extremity Strength Normal.  Lymphatic Head & Neck  General Head & Neck Lymphatics: Bilateral - Description - Normal. Axillary  General Axillary Region: Bilateral - Description - Normal. Tenderness - Non Tender. Femoral & Inguinal  Generalized Femoral & Inguinal Lymphatics: Bilateral - Description - Normal.  Tenderness - Non Tender.    Assessment & Plan  MALIGNANT NEOPLASM OF UPPER-OUTER QUADRANT OF LEFT BREAST IN FEMALE, ESTROGEN RECEPTOR POSITIVE (C50.412) Impression: the patient appears to have a 2.8 cm cancer in the upper outer quadrant of the left breast. She had 1 abnormal looking lymph node but the biopsy was benign. I have discussed with her in detail the different options for treatment and at this point she favors breast conservation. I think this is a reasonable way of approaching or cancer. I have discussed with her in detail the risks and benefits of the operation as well as some of the technical aspects including sentinel node biopsy and she understands and wishes to proceed. Since the cancer is palpable she will not need any localization. She will also follow up with medical and radiation oncology to discuss adjuvant therapy. This patient encounter took 60 minutes today to perform the following: take history, perform exam, review outside records, interpret imaging, counsel the patient on their diagnosis and document encounter, findings & plan in the EHR Current Plans Referred to Oncology, for evaluation and follow up (Oncology). Routine.  For port now for chemo. Risks and benefits discussed with the patient and she understands and wishes to proceed

## 2019-11-10 NOTE — Interval H&P Note (Signed)
History and Physical Interval Note:  11/10/2019 8:56 AM  Julia [name]  has presented today for surgery, with the diagnosis of LEFT BREAST CANCER.  The various methods of treatment have been discussed with the patient and family. After consideration of risks, benefits and other options for treatment, the patient has consented to  Procedure(s): INSERTION PORT-A-CATH WITH ULTRASOUND GUIDANCE (N/A) as a surgical intervention.  The patient's history has been reviewed, patient examined, no change in status, stable for surgery.  I have reviewed the patient's chart and labs.  Questions were answered to the patient's satisfaction.     Autumn Messing III

## 2019-11-10 NOTE — Anesthesia Procedure Notes (Signed)
Procedure Name: LMA Insertion Date/Time: 11/10/2019 9:20 AM Performed by: [place] Bridger Pizzi C, CRNA Pre-anesthesia Checklist: Patient identified, Emergency Drugs available, Suction available and Patient being monitored Patient Re-evaluated:Patient Re-evaluated prior to induction Oxygen Delivery Method: Circle system utilized Preoxygenation: Pre-oxygenation with 100% oxygen Induction Type: IV induction Ventilation: Mask ventilation without difficulty LMA: LMA inserted LMA Size: 4.0 Number of attempts: 1 Airway Equipment and Method: Bite block Placement Confirmation: positive ETCO2 Tube secured with: Tape Dental Injury: Teeth and Oropharynx as per pre-operative assessment

## 2019-11-10 NOTE — Transfer of Care (Signed)
Immediate Anesthesia Transfer of Care Note  Patient: Julia [name]  Procedure(s) Performed: INSERTION PORT-A-CATH (Right Chest)  Patient Location: PACU  Anesthesia Type:General  Level of Consciousness: awake, alert  and oriented  Airway & Oxygen Therapy: Patient Spontanous Breathing and Patient connected to face mask oxygen  Post-op Assessment: Report given to RN and Post -op Vital signs reviewed and stable  Post vital signs: Reviewed and stable  Last Vitals:  Vitals Value Taken Time  BP 145/77 11/10/19 1018  Temp    Pulse 87 11/10/19 1022  Resp 11 11/10/19 1022  SpO2 100 % 11/10/19 1022  Vitals shown include unvalidated device data.  Last Pain:  Vitals:   11/10/19 0716  TempSrc: Oral         Complications: No complications documented.

## 2019-11-10 NOTE — Anesthesia Postprocedure Evaluation (Signed)
Anesthesia Post Note  Patient: Julia [name]  Procedure(s) Performed: INSERTION PORT-A-CATH (Right Chest)     Patient location during evaluation: PACU Anesthesia Type: General Level of consciousness: awake and alert, oriented and patient cooperative Pain management: pain level controlled Vital Signs Assessment: post-procedure vital signs reviewed and stable Respiratory status: spontaneous breathing, nonlabored ventilation and respiratory function stable Cardiovascular status: blood pressure returned to baseline and stable Postop Assessment: no apparent nausea or vomiting Anesthetic complications: no   No complications documented.  Last Vitals:  Vitals:   11/10/19 0716 11/10/19 1019  BP: 140/80 (!) 145/77  Pulse: 83 87  Resp: 18 12  Temp: 36.8 C (!) 36.2 C  SpO2: 98% 100%    Last Pain:  Vitals:   11/10/19 0716  TempSrc: Oral                 Pervis Hocking

## 2019-11-11 ENCOUNTER — Encounter (HOSPITAL_COMMUNITY): Payer: Self-pay | Admitting: General Surgery

## 2019-11-14 ENCOUNTER — Encounter: Payer: Self-pay | Admitting: Hematology and Oncology

## 2019-11-14 NOTE — Progress Notes (Signed)
Called pt to introduce myself as her Arboriculturist.  Pt's ins should pay for her treatment at 100% so copay assistance shouldn't be needed.  I informed her of the [employer], went over what it covers and gave her the income requirement.  Pt would like to apply so she will bring proof of income on 11/15/19.  If approved I will give her an expense sheet and my card for any questions or concerns she may have in the future.

## 2019-11-14 NOTE — Progress Notes (Signed)
Patient Care Team: Tonia Ghent, MD as PCP - General (Family Medicine) [name], RN as Registered Nurse [name], RN as Registered Nurse [name], RN as Oncology Nurse Navigator [name], RN as Oncology Nurse Navigator  DIAGNOSIS:    ICD-10-CM   1. Malignant neoplasm of upper-outer quadrant of left breast in female, estrogen receptor positive ([place])  C50.412    Z17.0     SUMMARY OF ONCOLOGIC HISTORY: Oncology History  Malignant neoplasm of upper-outer quadrant of left breast in female, estrogen receptor positive ([place])  08/25/2019 Initial Diagnosis   Palpable left breast mass x1 month. Diagnostic mammogram and [place] on 08/18/19 showed a 2.8cm mass at the 2 o'clock position, and one lymph node with mild cortical thickening in the left axilla. 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   10/18/2019 Cancer Staging   Staging form: Breast, AJCC 8th Edition - Pathologic stage from 10/18/2019: Stage IB (pT2, pN0(sn), cM0, G3, ER+, PR+, HER2-) - Signed by Nicholas Lose, MD on 10/18/2019   10/21/2019 Oncotype testing   32/20%   11/15/2019 -  Chemotherapy   The patient had dexamethasone (DECADRON) 4 MG tablet, 4 mg (100 % of original dose 4 mg), Oral, Daily, 1 of 1 cycle, Start date: 10/31/2019, End date: -- Dose modification: 4 mg (original dose 4 mg, Cycle 0) DOXOrubicin (ADRIAMYCIN) chemo injection 90 mg, 50 mg/m2 = 90 mg (83.3 % of original dose 60 mg/m2), Intravenous,  Once, 1 of 4 cycles Dose modification: 50 mg/m2 (original dose 60 mg/m2, Cycle 1, Reason: Provider Judgment) palonosetron (ALOXI) injection 0.25 mg, 0.25 mg, Intravenous,  Once, 1 of 4 cycles pegfilgrastim-jmdb (FULPHILA)  injection 6 mg, 6 mg, Subcutaneous,  Once, 1 of 4 cycles cyclophosphamide (CYTOXAN) 900 mg in sodium chloride 0.9 % 250 mL chemo infusion, 500 mg/m2 = 900 mg (83.3 % of original dose 600 mg/m2), Intravenous,  Once, 1 of 4 cycles Dose modification: 500 mg/m2 (original dose 600 mg/m2, Cycle 1, Reason: Provider Judgment) PACLitaxel (TAXOL) 144 mg in sodium chloride 0.9 % 250 mL chemo infusion (</= 39m/m2), 80 mg/m2 = 144 mg, Intravenous,  Once, 0 of 12 cycles fosaprepitant (EMEND) 150 mg in sodium chloride 0.9 % 145 mL IVPB, 150 mg, Intravenous,  Once, 1 of 4 cycles  for chemotherapy treatment.      CHIEF COMPLIANT: Cycle 1 Adriamycin and Cytoxan  INTERVAL HISTORY: Julia ADDISis a 54y.o. with above-mentioned history of left breast cancer who underwent a left lumpectomy and is currently on adjuvant chemotherapy with dose dense Adriamycin and Cytoxan. Her port was placed by Dr. TMarlou Starkson 11/10/19. Echo on 11/07/19 showed an ejection fraction of 55-60%. She presents to the clinic todayfor cycle 1.   ALLERGIES:  is allergic to propoxyphene n-acetaminophen, nabumetone, and rofecoxib.  MEDICATIONS:  Current Outpatient Medications  Medication Sig Dispense Refill  . albuterol (PROVENTIL) (2.5 MG/3ML) 0.083% nebulizer solution Take 3 mLs (2.5 mg total) by nebulization every 6 (six) hours as needed for wheezing or shortness of breath. 75 mL 1  . albuterol (VENTOLIN HFA) 108 (90 Base) MCG/ACT inhaler Inhale 2 puffs into the lungs every 6 (six) hours as needed for wheezing or shortness of  breath. 18 g 3  . dexamethasone (DECADRON) 4 MG tablet Take 1 tablet (4 mg total) by mouth daily. Take 1 tablet day after chemo and 1 tablet second day after chemo with food 8 tablet 0  . EPINEPHrine (PRIMATENE MIST IN) Inhale 1 puff into the lungs every 6 (six) hours as needed (wheezing/shortness of breath.).    Marland Kitchen gabapentin (NEURONTIN) 300 MG capsule Take up to 4 tabs a day (2 tabs twice a day) (Patient taking  differently: Take 300 mg by mouth in the morning, at noon, and at bedtime. )    . HYDROcodone-acetaminophen (NORCO) 5-325 MG tablet Take 1-2 tablets by mouth every 6 (six) hours as needed for severe pain. Sedation caution 20 tablet 0  . HYDROcodone-acetaminophen (NORCO/VICODIN) 5-325 MG tablet Take 1-2 tablets by mouth every 6 (six) hours as needed for moderate pain or severe pain. 10 tablet 0  . ibuprofen (ADVIL) 600 MG tablet Take 1 tablet (600 mg total) by mouth every 8 (eight) hours as needed (For pain.  Take with food.). 90 tablet 2  . lidocaine-prilocaine (EMLA) cream Apply to affected area once 30 g 3  . omeprazole (PRILOSEC OTC) 20 MG tablet Take 20 mg by mouth daily.    . ondansetron (ZOFRAN) 8 MG tablet Take 1 tablet (8 mg total) by mouth 2 (two) times daily as needed. Start on the third day after chemotherapy. 30 tablet 1  . oxyCODONE (OXY IR/ROXICODONE) 5 MG immediate release tablet Take 1-2 tablets (5-10 mg total) by mouth every 6 (six) hours as needed for moderate pain, severe pain or breakthrough pain. 15 tablet 0  . prochlorperazine (COMPAZINE) 10 MG tablet Take 1 tablet (10 mg total) by mouth every 6 (six) hours as needed (Nausea or vomiting). 30 tablet 1  . traMADol (ULTRAM) 50 MG tablet Take 50 mg by mouth every 6 (six) hours as needed for pain.     No current facility-administered medications for this visit.   Facility-Administered Medications Ordered in Other Visits  Medication Dose Route Frequency Provider Last Rate Last Admin  . cyclophosphamide (CYTOXAN) 900 mg in sodium chloride 0.9 % 250 mL chemo infusion  500 mg/m2 (Treatment Plan Recorded) Intravenous Once Nicholas Lose, MD      . DOXOrubicin (ADRIAMYCIN) chemo injection 90 mg  50 mg/m2 (Treatment Plan Recorded) Intravenous Once Nicholas Lose, MD      . heparin lock flush 100 unit/mL  500 Units Intracatheter Once PRN Nicholas Lose, MD      . sodium chloride flush (NS) 0.9 % injection 10 mL  10 mL Intracatheter PRN  Nicholas Lose, MD        PHYSICAL EXAMINATION: ECOG PERFORMANCE STATUS: 1 - Symptomatic but completely ambulatory  There were no vitals filed for this visit. There were no vitals filed for this visit.  LABORATORY DATA:  I have reviewed the data as listed CMP Latest Ref Rng & Units 11/15/2019 07/18/2019 04/13/2017  Glucose 70 - 99 mg/dL 135(H) 115(H) 127(H)  BUN 6 - 20 mg/dL '12 7 13  ' Creatinine 0.44 - 1.00 mg/dL 0.74 0.64 0.83  Sodium 135 - 145 mmol/L 139 137 139  Potassium 3.5 - 5.1 mmol/L 3.9 4.5 4.3  Chloride 98 - 111 mmol/L 106 104 106  CO2 22 - 32 mmol/L '22 26 28  ' Calcium 8.9 - 10.3 mg/dL 10.0 9.6 9.5  Total Protein 6.5 - 8.1 g/dL 7.5 - 6.6  Total Bilirubin 0.3 - 1.2 mg/dL 0.7 - 0.3  Alkaline Phos 38 - 126  U/L 122 - 80  AST 15 - 41 U/L 27 - 16  ALT 0 - 44 U/L 48(H) - 23    Lab Results  Component Value Date   WBC 10.6 (H) 11/15/2019   HGB 15.2 (H) 11/15/2019   HCT 43.3 11/15/2019   MCV 91.7 11/15/2019   PLT 250 11/15/2019   NEUTROABS 6.8 11/15/2019    ASSESSMENT & PLAN:  Malignant neoplasm of upper-outer quadrant of left breast in female, estrogen receptor positive ([place]) 10/07/2019:Left lumpectomy and sentinel lymph node biopsy [name]): IDC with DCIS, 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 T2N0 stage Ib Oncotype DX score 32: 20% risk of distant recurrence in 9 years  Treatment plan:  1. Adjuvant chemotherapy with dose dense Adriamycin and Cytoxan x4 followed by Taxol weekly x12 2. followed by radiation therapy  3.  Followed by adjuvant antiestrogen therapy. ------------------------------------------------------------------------------------------------------------------------------------------------- Echocardiogram August 2021: EF 55 to 60% Labs reviewed Chemo class completed, chemo consent obtained Monitoring closely for  toxicities  Return to clinic in 1 week for toxicity check and follow-up    No orders of the defined types were placed in this encounter.  The patient has a good understanding of the overall plan. she agrees with it. she will call with any problems that may develop before the next visit here.  Total time spent: 30 mins including face to face time and time spent for planning, charting and coordination of care  Nicholas Lose, MD 11/15/2019  I, Cloyde Reams Dorshimer, am acting as scribe for Dr. Nicholas Lose.  I have reviewed the above documentation for accuracy and completeness, and I agree with the above.

## 2019-11-15 ENCOUNTER — Inpatient Hospital Stay: Payer: Medicaid Other

## 2019-11-15 ENCOUNTER — Encounter: Payer: Self-pay | Admitting: *Deleted

## 2019-11-15 ENCOUNTER — Other Ambulatory Visit: Payer: Self-pay

## 2019-11-15 ENCOUNTER — Inpatient Hospital Stay: Payer: Medicaid Other | Admitting: Hematology and Oncology

## 2019-11-15 ENCOUNTER — Inpatient Hospital Stay (HOSPITAL_BASED_OUTPATIENT_CLINIC_OR_DEPARTMENT_OTHER): Payer: Medicaid Other | Admitting: Hematology and Oncology

## 2019-11-15 DIAGNOSIS — Z170 Estrogen receptor positive status [ER+]: Secondary | ICD-10-CM

## 2019-11-15 DIAGNOSIS — C50412 Malignant neoplasm of upper-outer quadrant of left female breast: Secondary | ICD-10-CM

## 2019-11-15 DIAGNOSIS — Z5111 Encounter for antineoplastic chemotherapy: Secondary | ICD-10-CM | POA: Diagnosis present

## 2019-11-15 DIAGNOSIS — Z923 Personal history of irradiation: Secondary | ICD-10-CM | POA: Diagnosis not present

## 2019-11-15 DIAGNOSIS — Z79899 Other long term (current) drug therapy: Secondary | ICD-10-CM | POA: Diagnosis not present

## 2019-11-15 DIAGNOSIS — Z5189 Encounter for other specified aftercare: Secondary | ICD-10-CM | POA: Diagnosis not present

## 2019-11-15 LAB — CMP (CANCER CENTER ONLY)
ALT: 48 U/L — ABNORMAL HIGH (ref 0–44)
AST: 27 U/L (ref 15–41)
Albumin: 3.9 g/dL (ref 3.5–5.0)
Alkaline Phosphatase: 122 U/L (ref 38–126)
Anion gap: 11 (ref 5–15)
BUN: 12 mg/dL (ref 6–20)
CO2: 22 mmol/L (ref 22–32)
Calcium: 10 mg/dL (ref 8.9–10.3)
Chloride: 106 mmol/L (ref 98–111)
Creatinine: 0.74 mg/dL (ref 0.44–1.00)
GFR, Est AFR Am: >60 mL/min (ref >60)
GFR, Estimated: >60 mL/min (ref >60)
Glucose, Bld: 135 mg/dL — ABNORMAL HIGH (ref 70–99)
Potassium: 3.9 mmol/L (ref 3.5–5.1)
Sodium: 139 mmol/L (ref 135–145)
Total Bilirubin: 0.7 mg/dL (ref 0.3–1.2)
Total Protein: 7.5 g/dL (ref 6.5–8.1)

## 2019-11-15 LAB — CBC WITH DIFFERENTIAL (CANCER CENTER ONLY)
Abs Immature Granulocytes: 0.06 10*3/uL (ref 0.00–0.07)
Basophils Absolute: 0 10*3/uL (ref 0.0–0.1)
Basophils Relative: 0 %
Eosinophils Absolute: 0.3 10*3/uL (ref 0.0–0.5)
Eosinophils Relative: 3 %
HCT: 43.3 % (ref 36.0–46.0)
Hemoglobin: 15.2 g/dL — ABNORMAL HIGH (ref 12.0–15.0)
Immature Granulocytes: 1 %
Lymphocytes Relative: 26 %
Lymphs Abs: 2.7 10*3/uL (ref 0.7–4.0)
MCH: 32.2 pg (ref 26.0–34.0)
MCHC: 35.1 g/dL (ref 30.0–36.0)
MCV: 91.7 fL (ref 80.0–100.0)
Monocytes Absolute: 0.7 10*3/uL (ref 0.1–1.0)
Monocytes Relative: 7 %
Neutro Abs: 6.8 10*3/uL (ref 1.7–7.7)
Neutrophils Relative %: 63 %
Platelet Count: 250 10*3/uL (ref 150–400)
RBC: 4.72 MIL/uL (ref 3.87–5.11)
RDW: 12.5 % (ref 11.5–15.5)
WBC Count: 10.6 10*3/uL — ABNORMAL HIGH (ref 4.0–10.5)
nRBC: 0 % (ref 0.0–0.2)

## 2019-11-15 MED ORDER — SODIUM CHLORIDE 0.9 % IV SOLN
Freq: Once | INTRAVENOUS | Status: AC
  Filled 2019-11-15: qty 250

## 2019-11-15 MED ORDER — SODIUM CHLORIDE 0.9 % IV SOLN
150.0000 mg | Freq: Once | INTRAVENOUS | Status: AC
  Administered 2019-11-15: 150 mg via INTRAVENOUS
  Filled 2019-11-15: qty 150

## 2019-11-15 MED ORDER — SODIUM CHLORIDE 0.9 % IV SOLN
500.0000 mg/m2 | Freq: Once | INTRAVENOUS | Status: AC
  Administered 2019-11-15: 900 mg via INTRAVENOUS
  Filled 2019-11-15: qty 45

## 2019-11-15 MED ORDER — HEPARIN SOD (PORK) LOCK FLUSH 100 UNIT/ML IV SOLN
500.0000 [IU] | Freq: Once | INTRAVENOUS | Status: AC | PRN
  Administered 2019-11-15: 500 [IU]
  Filled 2019-11-15: qty 5

## 2019-11-15 MED ORDER — DOXORUBICIN HCL CHEMO IV INJECTION 2 MG/ML
50.0000 mg/m2 | Freq: Once | INTRAVENOUS | Status: AC
  Administered 2019-11-15: 90 mg via INTRAVENOUS
  Filled 2019-11-15: qty 45

## 2019-11-15 MED ORDER — SODIUM CHLORIDE 0.9% FLUSH
10.0000 mL | INTRAVENOUS | Status: DC | PRN
  Administered 2019-11-15: 10 mL
  Filled 2019-11-15: qty 10

## 2019-11-15 MED ORDER — PALONOSETRON HCL INJECTION 0.25 MG/5ML
0.2500 mg | Freq: Once | INTRAVENOUS | Status: AC
  Administered 2019-11-15: 0.25 mg via INTRAVENOUS

## 2019-11-15 MED ORDER — PALONOSETRON HCL INJECTION 0.25 MG/5ML
INTRAVENOUS | Status: AC
  Filled 2019-11-15: qty 5

## 2019-11-15 MED ORDER — SODIUM CHLORIDE 0.9 % IV SOLN
10.0000 mg | Freq: Once | INTRAVENOUS | Status: AC
  Administered 2019-11-15: 10 mg via INTRAVENOUS
  Filled 2019-11-15: qty 10

## 2019-11-15 NOTE — Assessment & Plan Note (Signed)
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 T2N0 stage Ib Oncotype DX score 32: 20% risk of distant recurrence in 9 years  Treatment plan:  1. Adjuvant chemotherapy with dose dense Adriamycin and Cytoxan x4 followed by Taxol weekly x12 2. followed by radiation therapy  3.  Followed by adjuvant antiestrogen therapy. ------------------------------------------------------------------------------------------------------------------------------------------------- Echocardiogram August 2021: EF 55 to 60% Labs reviewed Chemo class completed, chemo consent obtained Monitoring closely for toxicities  Return to clinic in 1 week for toxicity check and follow-up

## 2019-11-15 NOTE — Patient Instructions (Signed)
[facility] Discharge Instructions for Patients Receiving Chemotherapy  Today you received the following chemotherapy agents: Doxorubicin and Cytoxan  To help prevent nausea and vomiting after your treatment, we encourage you to take your nausea medication as directed by your MD.   If you develop nausea and vomiting that is not controlled by your nausea medication, call the clinic.   BELOW ARE SYMPTOMS THAT SHOULD BE REPORTED IMMEDIATELY:  *FEVER GREATER THAN 100.5 F  *CHILLS WITH OR WITHOUT FEVER  NAUSEA AND VOMITING THAT IS NOT CONTROLLED WITH YOUR NAUSEA MEDICATION  *UNUSUAL SHORTNESS OF BREATH  *UNUSUAL BRUISING OR BLEEDING  TENDERNESS IN MOUTH AND THROAT WITH OR WITHOUT PRESENCE OF ULCERS  *URINARY PROBLEMS  *BOWEL PROBLEMS  UNUSUAL RASH Items with * indicate a potential emergency and should be followed up as soon as possible.  Feel free to call the clinic should you have any questions or concerns. The clinic phone number is [phone].  Please show the [facility] at check-in to the Emergency Department and triage nurse.  Doxorubicin injection What is this medicine? DOXORUBICIN (dox oh ROO bi sin) is a chemotherapy drug. It is used to treat many kinds of cancer like leukemia, lymphoma, neuroblastoma, sarcoma, and Wilms' tumor. It is also used to treat bladder cancer, breast cancer, lung cancer, ovarian cancer, stomach cancer, and thyroid cancer. This medicine may be used for other purposes; ask your health care provider or pharmacist if you have questions. COMMON BRAND NAME(S): Adriamycin, Adriamycin PFS, Adriamycin RDF, Rubex What should I tell my health care provider before I take this medicine? They need to know if you have any of these conditions:  heart disease  history of low blood counts caused by a medicine  liver disease  recent or ongoing radiation therapy  an unusual or allergic reaction to doxorubicin, other chemotherapy  agents, other medicines, foods, dyes, or preservatives  pregnant or trying to get pregnant  breast-feeding How should I use this medicine? This drug is given as an infusion into a vein. It is administered in a hospital or clinic by a specially trained health care professional. If you have pain, swelling, burning or any unusual feeling around the site of your injection, tell your health care professional right away. Talk to your pediatrician regarding the use of this medicine in children. Special care may be needed. Overdosage: If you think you have taken too much of this medicine contact a poison control center or emergency room at once. NOTE: This medicine is only for you. Do not share this medicine with others. What if I miss a dose? It is important not to miss your dose. Call your doctor or health care professional if you are unable to keep an appointment. What may interact with this medicine? This medicine may interact with the following medications:  6-mercaptopurine  paclitaxel  phenytoin  St. John's Wort  trastuzumab  verapamil This list may not describe all possible interactions. Give your health care provider a list of all the medicines, herbs, non-prescription drugs, or dietary supplements you use. Also tell them if you smoke, drink alcohol, or use illegal drugs. Some items may interact with your medicine. What should I watch for while using this medicine? This drug may make you feel generally unwell. This is not uncommon, as chemotherapy can affect healthy cells as well as cancer cells. Report any side effects. Continue your course of treatment even though you feel ill unless your doctor tells you to stop. There is  a maximum amount of this medicine you should receive throughout your life. The amount depends on the medical condition being treated and your overall health. Your doctor will watch how much of this medicine you receive in your lifetime. Tell your doctor if you have  taken this medicine before. You may need blood work done while you are taking this medicine. Your urine may turn red for a few days after your dose. This is not blood. If your urine is dark or brown, call your doctor. In some cases, you may be given additional medicines to help with side effects. Follow all directions for their use. Call your doctor or health care professional for advice if you get a fever, chills or sore throat, or other symptoms of a cold or flu. Do not treat yourself. This drug decreases your body's ability to fight infections. Try to avoid being around people who are sick. This medicine may increase your risk to bruise or bleed. Call your doctor or health care professional if you notice any unusual bleeding. Talk to your doctor about your risk of cancer. You may be more at risk for certain types of cancers if you take this medicine. Do not become pregnant while taking this medicine or for 6 months after stopping it. Women should inform their doctor if they wish to become pregnant or think they might be pregnant. Men should not father a child while taking this medicine and for 6 months after stopping it. There is a potential for serious side effects to an unborn child. Talk to your health care professional or pharmacist for more information. Do not breast-feed an infant while taking this medicine. This medicine has caused ovarian failure in some women and reduced sperm counts in some men This medicine may interfere with the ability to have a child. Talk with your doctor or health care professional if you are concerned about your fertility. This medicine may cause a decrease in Co-Enzyme Q-10. You should make sure that you get enough Co-Enzyme Q-10 while you are taking this medicine. Discuss the foods you eat and the vitamins you take with your health care professional. What side effects may I notice from receiving this medicine? Side effects that you should report to your doctor or  health care professional as soon as possible:  allergic reactions like skin rash, itching or hives, swelling of the face, lips, or tongue  breathing problems  chest pain  fast or irregular heartbeat  low blood counts - this medicine may decrease the number of white blood cells, red blood cells and platelets. You may be at increased risk for infections and bleeding.  pain, redness, or irritation at site where injected  signs of infection - fever or chills, cough, sore throat, pain or difficulty passing urine  signs of decreased platelets or bleeding - bruising, pinpoint red spots on the skin, black, tarry stools, blood in the urine  swelling of the ankles, feet, hands  tiredness  weakness Side effects that usually do not require medical attention (report to your doctor or health care professional if they continue or are bothersome):  diarrhea  hair loss  mouth sores  nail discoloration or damage  nausea  red colored urine  vomiting This list may not describe all possible side effects. Call your doctor for medical advice about side effects. You may report side effects to FDA at 1-800-FDA-1088. Where should I keep my medicine? This drug is given in a hospital or clinic and will not be  stored at home. NOTE: This sheet is a summary. It may not cover all possible information. If you have questions about this medicine, talk to your doctor, pharmacist, or health care provider.  2020 Elsevier/Gold Standard (2016-11-05 11:01:26)  Cyclophosphamide Injection What is this medicine? CYCLOPHOSPHAMIDE (sye kloe FOSS fa mide) is a chemotherapy drug. It slows the growth of cancer cells. This medicine is used to treat many types of cancer like lymphoma, myeloma, leukemia, breast cancer, and ovarian cancer, to name a few. This medicine may be used for other purposes; ask your health care provider or pharmacist if you have questions. COMMON BRAND NAME(S): Cytoxan, Neosar What should I  tell my health care provider before I take this medicine? They need to know if you have any of these conditions:  heart disease  history of irregular heartbeat  infection  kidney disease  liver disease  low blood counts, like white cells, platelets, or red blood cells  on hemodialysis  recent or ongoing radiation therapy  scarring or thickening of the lungs  trouble passing urine  an unusual or allergic reaction to cyclophosphamide, other medicines, foods, dyes, or preservatives  pregnant or trying to get pregnant  breast-feeding How should I use this medicine? This drug is usually given as an injection into a vein or muscle or by infusion into a vein. It is administered in a hospital or clinic by a specially trained health care professional. Talk to your pediatrician regarding the use of this medicine in children. Special care may be needed. Overdosage: If you think you have taken too much of this medicine contact a poison control center or emergency room at once. NOTE: This medicine is only for you. Do not share this medicine with others. What if I miss a dose? It is important not to miss your dose. Call your doctor or health care professional if you are unable to keep an appointment. What may interact with this medicine?  amphotericin B  azathioprine  certain antivirals for HIV or hepatitis  certain medicines for blood pressure, heart disease, irregular heart beat  certain medicines that treat or prevent blood clots like warfarin  certain other medicines for cancer  cyclosporine  etanercept  indomethacin  medicines that relax muscles for surgery  medicines to increase blood counts  metronidazole This list may not describe all possible interactions. Give your health care provider a list of all the medicines, herbs, non-prescription drugs, or dietary supplements you use. Also tell them if you smoke, drink alcohol, or use illegal drugs. Some items may  interact with your medicine. What should I watch for while using this medicine? Your condition will be monitored carefully while you are receiving this medicine. You may need blood work done while you are taking this medicine. Drink water or other fluids as directed. Urinate often, even at night. Some products may contain alcohol. Ask your health care professional if this medicine contains alcohol. Be sure to tell all health care professionals you are taking this medicine. Certain medicines, like metronidazole and disulfiram, can cause an unpleasant reaction when taken with alcohol. The reaction includes flushing, headache, nausea, vomiting, sweating, and increased thirst. The reaction can last from 30 minutes to several hours. Do not become pregnant while taking this medicine or for 1 year after stopping it. Women should inform their health care professional if they wish to become pregnant or think they might be pregnant. Men should not father a child while taking this medicine and for 4 months after stopping  it. There is potential for serious side effects to an unborn child. Talk to your health care professional for more information. Do not breast-feed an infant while taking this medicine or for 1 week after stopping it. This medicine has caused ovarian failure in some women. This medicine may make it more difficult to get pregnant. Talk to your health care professional if you are concerned about your fertility. This medicine has caused decreased sperm counts in some men. This may make it more difficult to father a child. Talk to your health care professional if you are concerned about your fertility. Call your health care professional for advice if you get a fever, chills, or sore throat, or other symptoms of a cold or flu. Do not treat yourself. This medicine decreases your body's ability to fight infections. Try to avoid being around people who are sick. Avoid taking medicines that contain aspirin,  acetaminophen, ibuprofen, naproxen, or ketoprofen unless instructed by your health care professional. These medicines may hide a fever. Talk to your health care professional about your risk of cancer. You may be more at risk for certain types of cancer if you take this medicine. If you are going to need surgery or other procedure, tell your health care professional that you are using this medicine. Be careful brushing or flossing your teeth or using a toothpick because you may get an infection or bleed more easily. If you have any dental work done, tell your dentist you are receiving this medicine. What side effects may I notice from receiving this medicine? Side effects that you should report to your doctor or health care professional as soon as possible:  allergic reactions like skin rash, itching or hives, swelling of the face, lips, or tongue  breathing problems  nausea, vomiting  signs and symptoms of bleeding such as bloody or black, tarry stools; red or dark brown urine; spitting up blood or brown material that looks like coffee grounds; red spots on the skin; unusual bruising or bleeding from the eyes, gums, or nose  signs and symptoms of heart failure like fast, irregular heartbeat, sudden weight gain; swelling of the ankles, feet, hands  signs and symptoms of infection like fever; chills; cough; sore throat; pain or trouble passing urine  signs and symptoms of kidney injury like trouble passing urine or change in the amount of urine  signs and symptoms of liver injury like dark yellow or brown urine; general ill feeling or flu-like symptoms; light-colored stools; loss of appetite; nausea; right upper belly pain; unusually weak or tired; yellowing of the eyes or skin Side effects that usually do not require medical attention (report to your doctor or health care professional if they continue or are bothersome):  confusion  decreased hearing  diarrhea  facial flushing  hair  loss  headache  loss of appetite  missed menstrual periods  signs and symptoms of low red blood cells or anemia such as unusually weak or tired; feeling faint or lightheaded; falls  skin discoloration This list may not describe all possible side effects. Call your doctor for medical advice about side effects. You may report side effects to FDA at 1-800-FDA-1088. Where should I keep my medicine? This drug is given in a hospital or clinic and will not be stored at home. NOTE: This sheet is a summary. It may not cover all possible information. If you have questions about this medicine, talk to your doctor, pharmacist, or health care provider.  2020 Elsevier/Gold Standard (2018-12-27 09:53:29)

## 2019-11-15 NOTE — Progress Notes (Signed)
Blood return noted before, during, and after Doxorubicin Injection.

## 2019-11-15 NOTE — Assessment & Plan Note (Signed)
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 T2N0 stage Ib Oncotype DX score 32: 20% risk of distant recurrence in 9 years  Treatment plan:  1. Adjuvant chemotherapy with dose dense Adriamycin and Cytoxan x4 followed by Taxol weekly x12 2. followed by radiation therapy  3.  Followed by adjuvant antiestrogen therapy. ------------------------------------------------------------------------------------------------------------------------------------------------- Echocardiogram August 2021: EF 55 to 60% Labs reviewed Chemo class completed, chemo consent obtained Monitoring closely for toxicities  Return to clinic in 1 week for toxicity check and follow-up 

## 2019-11-15 NOTE — Progress Notes (Signed)
Patient Care Team: Tonia Ghent, MD as PCP - General (Family Medicine) [name], RN as Registered Nurse [name], RN as Registered Nurse [name], RN as Oncology Nurse Navigator [name], RN as Oncology Nurse Navigator  DIAGNOSIS:  Encounter Diagnosis  Name Primary?   Malignant neoplasm of upper-outer quadrant of left breast in female, estrogen receptor positive ([place])     SUMMARY OF ONCOLOGIC HISTORY: Oncology History  Malignant neoplasm of upper-outer quadrant of left breast in female, estrogen receptor positive ([place])  08/25/2019 Initial Diagnosis   Palpable left breast mass x1 month. Diagnostic mammogram and [place] on 08/18/19 showed a 2.8cm mass at the 2 o'clock position, and one lymph node with mild cortical thickening in the left axilla. 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   10/18/2019 Cancer Staging   Staging form: Breast, AJCC 8th Edition - Pathologic stage from 10/18/2019: Stage IB (pT2, pN0(sn), cM0, G3, ER+, PR+, HER2-) - Signed by Nicholas Lose, MD on 10/18/2019   10/21/2019 Oncotype testing   32/20%   11/15/2019 -  Chemotherapy   The patient had dexamethasone (DECADRON) 4 MG tablet, 4 mg (100 % of original dose 4 mg), Oral, Daily, 1 of 1 cycle, Start date: 10/31/2019, End date: -- Dose modification: 4 mg (original dose 4 mg, Cycle 0) DOXOrubicin (ADRIAMYCIN) chemo injection 90 mg, 50 mg/m2 = 90 mg (83.3 % of original dose 60 mg/m2), Intravenous,  Once, 0 of 4 cycles Dose modification: 50 mg/m2 (original dose 60 mg/m2, Cycle 1, Reason: Provider Judgment) palonosetron (ALOXI) injection 0.25 mg, 0.25 mg, Intravenous,  Once, 0 of 4 cycles pegfilgrastim-jmdb (FULPHILA)  injection 6 mg, 6 mg, Subcutaneous,  Once, 0 of 4 cycles cyclophosphamide (CYTOXAN) 900 mg in sodium chloride 0.9 % 250 mL chemo infusion, 500 mg/m2 = 900 mg (83.3 % of original dose 600 mg/m2), Intravenous,  Once, 0 of 4 cycles Dose modification: 500 mg/m2 (original dose 600 mg/m2, Cycle 1, Reason: Provider Judgment) PACLitaxel (TAXOL) 144 mg in sodium chloride 0.9 % 250 mL chemo infusion (</= 62m/m2), 80 mg/m2 = 144 mg, Intravenous,  Once, 0 of 12 cycles fosaprepitant (EMEND) 150 mg in sodium chloride 0.9 % 145 mL IVPB, 150 mg, Intravenous,  Once, 0 of 4 cycles  for chemotherapy treatment.      CHIEF COMPLIANT: Cycle 1 day 1 dose dense Adriamycin and Cytoxan  INTERVAL HISTORY: Julia BOHANNONis a 54year old with above-mentioned history of left breast cancer treated with lumpectomy and had high Oncotype score and is here today to start her first cycle of chemotherapy with dose dense Adriamycin and Cytoxan.  Her echocardiogram was perfect.  She was able to complete chemo education.  She does not have any further questions today.   ALLERGIES:  is allergic to propoxyphene n-acetaminophen, nabumetone, and rofecoxib.  MEDICATIONS:  Current Outpatient Medications  Medication Sig Dispense Refill   albuterol (PROVENTIL) (2.5 MG/3ML) 0.083% nebulizer solution Take 3 mLs (2.5 mg total) by nebulization every 6 (six) hours as needed for wheezing or shortness of breath. 75 mL 1   albuterol (VENTOLIN HFA) 108 (90 Base) MCG/ACT inhaler Inhale 2 puffs into the lungs every 6 (six) hours as needed for wheezing or  shortness of breath. 18 g 3   dexamethasone (DECADRON) 4 MG tablet Take 1 tablet (4 mg total) by mouth daily. Take 1 tablet day after chemo and 1 tablet second day after chemo with food 8 tablet 0   EPINEPHrine (PRIMATENE MIST IN) Inhale 1 puff into the lungs every 6 (six) hours as needed (wheezing/shortness of breath.).     gabapentin (NEURONTIN) 300 MG capsule Take up to 4 tabs a day (2 tabs  twice a day) (Patient taking differently: Take 300 mg by mouth in the morning, at noon, and at bedtime. )     HYDROcodone-acetaminophen (NORCO) 5-325 MG tablet Take 1-2 tablets by mouth every 6 (six) hours as needed for severe pain. Sedation caution 20 tablet 0   HYDROcodone-acetaminophen (NORCO/VICODIN) 5-325 MG tablet Take 1-2 tablets by mouth every 6 (six) hours as needed for moderate pain or severe pain. 10 tablet 0   ibuprofen (ADVIL) 600 MG tablet Take 1 tablet (600 mg total) by mouth every 8 (eight) hours as needed (For pain.  Take with food.). 90 tablet 2   lidocaine-prilocaine (EMLA) cream Apply to affected area once 30 g 3   omeprazole (PRILOSEC OTC) 20 MG tablet Take 20 mg by mouth daily.     ondansetron (ZOFRAN) 8 MG tablet Take 1 tablet (8 mg total) by mouth 2 (two) times daily as needed. Start on the third day after chemotherapy. 30 tablet 1   oxyCODONE (OXY IR/ROXICODONE) 5 MG immediate release tablet Take 1-2 tablets (5-10 mg total) by mouth every 6 (six) hours as needed for moderate pain, severe pain or breakthrough pain. 15 tablet 0   prochlorperazine (COMPAZINE) 10 MG tablet Take 1 tablet (10 mg total) by mouth every 6 (six) hours as needed (Nausea or vomiting). 30 tablet 1   traMADol (ULTRAM) 50 MG tablet Take 50 mg by mouth every 6 (six) hours as needed for pain.     No current facility-administered medications for this visit.    PHYSICAL EXAMINATION: ECOG PERFORMANCE STATUS: 1 - Symptomatic but completely ambulatory  Vitals:   11/15/19 1204  BP: 128/61  Pulse: 75  Resp: 18  Temp: 98.4 F (36.9 C)  SpO2: 98%   Filed Weights   11/15/19 1204  Weight: 155 lb 11.2 oz (70.6 kg)      LABORATORY DATA:  I have reviewed the data as listed CMP Latest Ref Rng & Units 07/18/2019 04/13/2017 07/03/2016  Glucose 70 - 99 mg/dL 115(H) 127(H) 131(H)  BUN 6 - 23 mg/dL '7 13 13  ' Creatinine 0.40 - 1.20 mg/dL 0.64 0.83 0.82  Sodium 135 - 145 mEq/L 137 139 139  Potassium 3.5  - 5.1 mEq/L 4.5 4.3 3.8  Chloride 96 - 112 mEq/L 104 106 103  CO2 19 - 32 mEq/L '26 28 24  ' Calcium 8.4 - 10.5 mg/dL 9.6 9.5 9.9  Total Protein 6.0 - 8.3 g/dL - 6.6 -  Total Bilirubin 0.2 - 1.2 mg/dL - 0.3 -  Alkaline Phos 39 - 117 U/L - 80 -  AST 0 - 37 U/L - 16 -  ALT 0 - 35 U/L - 23 -    Lab Results  Component Value Date   WBC 10.6 (H) 11/15/2019   HGB 15.2 (H) 11/15/2019   HCT 43.3 11/15/2019   MCV 91.7 11/15/2019   PLT 250 11/15/2019   NEUTROABS 6.8 11/15/2019    ASSESSMENT & PLAN:  Malignant neoplasm of upper-outer quadrant of left breast in female, estrogen receptor positive ([place]) 10/07/2019:Left  lumpectomy and sentinel lymph node biopsy [name]): IDC with DCIS, 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 T2N0 stage Ib Oncotype DX score 32: 20% risk of distant recurrence in 9 years  Treatment plan:  1. Adjuvant chemotherapy with dose dense Adriamycin and Cytoxan x4 followed by Taxol weekly x12 2. followed by radiation therapy  3.  Followed by adjuvant antiestrogen therapy. ------------------------------------------------------------------------------------------------------------------------------------------------- Echocardiogram August 2021: EF 55 to 60% Labs reviewed Chemo class completed, chemo consent obtained Monitoring closely for toxicities  Return to clinic in 1 week for toxicity check and follow-up    No orders of the defined types were placed in this encounter.  The patient has a good understanding of the overall plan. she agrees with it. she will call with any problems that may develop before the next visit here. Total time spent: 30 mins including face to face time and time spent for planning, charting and co-ordination of care   Harriette Ohara, MD 11/15/19

## 2019-11-16 ENCOUNTER — Telehealth: Payer: Self-pay | Admitting: *Deleted

## 2019-11-16 ENCOUNTER — Telehealth: Payer: Self-pay | Admitting: Hematology and Oncology

## 2019-11-16 NOTE — Telephone Encounter (Signed)
No 8/10 los, no changes made to pt appts

## 2019-11-17 ENCOUNTER — Inpatient Hospital Stay: Payer: Medicaid Other

## 2019-11-17 ENCOUNTER — Other Ambulatory Visit: Payer: Self-pay

## 2019-11-17 VITALS — BP 105/71 | HR 62 | Temp 98.3°F | Resp 16

## 2019-11-17 DIAGNOSIS — C50412 Malignant neoplasm of upper-outer quadrant of left female breast: Secondary | ICD-10-CM

## 2019-11-17 DIAGNOSIS — Z5111 Encounter for antineoplastic chemotherapy: Secondary | ICD-10-CM | POA: Diagnosis not present

## 2019-11-17 DIAGNOSIS — Z170 Estrogen receptor positive status [ER+]: Secondary | ICD-10-CM

## 2019-11-17 MED ORDER — PEGFILGRASTIM-JMDB 6 MG/0.6ML ~~LOC~~ SOSY
6.0000 mg | PREFILLED_SYRINGE | Freq: Once | SUBCUTANEOUS | Status: AC
  Administered 2019-11-17: 6 mg via SUBCUTANEOUS

## 2019-11-17 MED ORDER — PEGFILGRASTIM-JMDB 6 MG/0.6ML ~~LOC~~ SOSY
PREFILLED_SYRINGE | SUBCUTANEOUS | Status: AC
  Filled 2019-11-17: qty 0.6

## 2019-11-17 NOTE — Patient Instructions (Signed)

## 2019-11-22 ENCOUNTER — Other Ambulatory Visit: Payer: Medicaid Other

## 2019-11-22 ENCOUNTER — Ambulatory Visit: Payer: Medicaid Other | Admitting: Hematology and Oncology

## 2019-11-22 NOTE — Progress Notes (Signed)
Patient Care Team: Tonia Ghent, MD as PCP - General (Family Medicine) [name], RN as Registered Nurse [name], RN as Registered Nurse [name], RN as Oncology Nurse Navigator [name], RN as Oncology Nurse Navigator  DIAGNOSIS:    ICD-10-CM   1. Malignant neoplasm of upper-outer quadrant of left breast in female, estrogen receptor positive ([place])  C50.412    Z17.0     SUMMARY OF ONCOLOGIC HISTORY: Oncology History  Malignant neoplasm of upper-outer quadrant of left breast in female, estrogen receptor positive ([place])  08/25/2019 Initial Diagnosis   Palpable left breast mass x1 month. Diagnostic mammogram and [place] on 08/18/19 showed a 2.8cm mass at the 2 o'clock position, and one lymph node with mild cortical thickening in the left axilla. 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   10/18/2019 Cancer Staging   Staging form: Breast, AJCC 8th Edition - Pathologic stage from 10/18/2019: Stage IB (pT2, pN0(sn), cM0, G3, ER+, PR+, HER2-) - Signed by Nicholas Lose, MD on 10/18/2019   10/21/2019 Oncotype testing   32/20%   11/15/2019 -  Chemotherapy   The patient had dexamethasone (DECADRON) 4 MG tablet, 4 mg (100 % of original dose 4 mg), Oral, Daily, 1 of 1 cycle, Start date: 10/31/2019, End date: -- Dose modification: 4 mg (original dose 4 mg, Cycle 0) DOXOrubicin (ADRIAMYCIN) chemo injection 90 mg, 50 mg/m2 = 90 mg (83.3 % of original dose 60 mg/m2), Intravenous,  Once, 1 of 4 cycles Dose modification: 50 mg/m2 (original dose 60 mg/m2, Cycle 1, Reason: Provider Judgment) Administration: 90 mg (11/15/2019) palonosetron (ALOXI) injection 0.25 mg, 0.25 mg, Intravenous,  Once, 1 of 4  cycles Administration: 0.25 mg (11/15/2019) pegfilgrastim-jmdb (FULPHILA) injection 6 mg, 6 mg, Subcutaneous,  Once, 1 of 4 cycles Administration: 6 mg (11/17/2019) cyclophosphamide (CYTOXAN) 900 mg in sodium chloride 0.9 % 250 mL chemo infusion, 500 mg/m2 = 900 mg (83.3 % of original dose 600 mg/m2), Intravenous,  Once, 1 of 4 cycles Dose modification: 500 mg/m2 (original dose 600 mg/m2, Cycle 1, Reason: Provider Judgment) Administration: 900 mg (11/15/2019) PACLitaxel (TAXOL) 144 mg in sodium chloride 0.9 % 250 mL chemo infusion (</= 52m/m2), 80 mg/m2 = 144 mg, Intravenous,  Once, 0 of 12 cycles fosaprepitant (EMEND) 150 mg in sodium chloride 0.9 % 145 mL IVPB, 150 mg, Intravenous,  Once, 1 of 4 cycles Administration: 150 mg (11/15/2019)  for chemotherapy treatment.      CHIEF COMPLIANT: Cycle 1 Day 8 Adriamycin and Cytoxan  INTERVAL HISTORY: KSHARDAE KLEINMANis a 54y.o. with above-mentioned history of left breast cancer treated with lumpectomy and who is currently on adjuvant chemotherapy with dose dense Adriamycin and Cytoxan. She presents to the clinic today for a toxicity check following cycle 1.   ALLERGIES:  is allergic to propoxyphene n-acetaminophen, nabumetone, and rofecoxib.  MEDICATIONS:  Current Outpatient Medications  Medication Sig Dispense Refill  . albuterol (PROVENTIL) (2.5 MG/3ML) 0.083% nebulizer solution Take 3 mLs (2.5 mg total) by nebulization every 6 (six) hours as needed for wheezing or shortness of breath. 75 mL 1  . albuterol (VENTOLIN HFA) 108 (90 Base) MCG/ACT inhaler Inhale 2 puffs into the lungs every 6 (six)  hours as needed for wheezing or shortness of breath. 18 g 3  . dexamethasone (DECADRON) 4 MG tablet Take 1 tablet (4 mg total) by mouth daily. Take 1 tablet day after chemo and 1 tablet second day after chemo with food 8 tablet 0  . EPINEPHrine (PRIMATENE MIST IN) Inhale 1 puff into the lungs every 6 (six) hours as needed (wheezing/shortness of breath.).     Marland Kitchen gabapentin (NEURONTIN) 300 MG capsule Take up to 4 tabs a day (2 tabs twice a day) (Patient taking differently: Take 300 mg by mouth in the morning, at noon, and at bedtime. )    . HYDROcodone-acetaminophen (NORCO) 5-325 MG tablet Take 1-2 tablets by mouth every 6 (six) hours as needed for severe pain. Sedation caution 20 tablet 0  . HYDROcodone-acetaminophen (NORCO/VICODIN) 5-325 MG tablet Take 1-2 tablets by mouth every 6 (six) hours as needed for moderate pain or severe pain. 10 tablet 0  . ibuprofen (ADVIL) 600 MG tablet Take 1 tablet (600 mg total) by mouth every 8 (eight) hours as needed (For pain.  Take with food.). 90 tablet 2  . lidocaine-prilocaine (EMLA) cream Apply to affected area once 30 g 3  . omeprazole (PRILOSEC OTC) 20 MG tablet Take 20 mg by mouth daily.    . ondansetron (ZOFRAN) 8 MG tablet Take 1 tablet (8 mg total) by mouth 2 (two) times daily as needed. Start on the third day after chemotherapy. 30 tablet 1  . oxyCODONE (OXY IR/ROXICODONE) 5 MG immediate release tablet Take 1-2 tablets (5-10 mg total) by mouth every 6 (six) hours as needed for moderate pain, severe pain or breakthrough pain. 15 tablet 0  . prochlorperazine (COMPAZINE) 10 MG tablet Take 1 tablet (10 mg total) by mouth every 6 (six) hours as needed (Nausea or vomiting). 30 tablet 1  . traMADol (ULTRAM) 50 MG tablet Take 50 mg by mouth every 6 (six) hours as needed for pain.     No current facility-administered medications for this visit.    PHYSICAL EXAMINATION: ECOG PERFORMANCE STATUS: 1 - Symptomatic but completely ambulatory  There were no vitals filed for this visit. There were no vitals filed for this visit.  LABORATORY DATA:  I have reviewed the data as listed CMP Latest Ref Rng & Units 11/15/2019 07/18/2019 04/13/2017  Glucose 70 - 99 mg/dL 135(H) 115(H) 127(H)  BUN 6 - 20 mg/dL _0 Creatinine 0.44 - 1.00 mg/dL 0.74 0.64 0.83  Sodium 135 - 145 mmol/L 139 137 139  Potassium 3.5 - 5.1 mmol/L  3.9 4.5 4.3  Chloride 98 - 111 mmol/L 106 104 106  CO2 22 - 32 mmol/L _1 Calcium 8.9 - 10.3 mg/dL 10.0 9.6 9.5  Total Protein 6.5 - 8.1 g/dL 7.5 - 6.6  Total Bilirubin 0.3 - 1.2 mg/dL 0.7 - 0.3  Alkaline Phos 38 - 126 U/L 122 - 80  AST 15 - 41 U/L 27 - 16  ALT 0 - 44 U/L 48(H) - 23    Lab Results  Component Value Date   WBC 10.6 (H) 11/15/2019   HGB 15.2 (H) 11/15/2019   HCT 43.3 11/15/2019   MCV 91.7 11/15/2019   PLT 250 11/15/2019   NEUTROABS 6.8 11/15/2019    ASSESSMENT & PLAN:  Malignant neoplasm of upper-outer quadrant of left breast in female, estrogen receptor positive ([place]) 10/07/2019:Left lumpectomy and sentinel lymph node biopsy [name]): IDC with DCIS, 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ER 90%, PR  90%, HER-2 negative by FISH Posterior margin positive but no further surgery done because it is muscle on the back. T2N0 stage Ib Oncotype DX score 32: 20% risk of distant recurrence in 9 years  Treatment plan:  1. Adjuvant chemotherapy with dose dense Adriamycin and Cytoxan x4 followed by Taxol weekly x12 2. followed by radiation therapy  3.  Followed by adjuvant antiestrogen therapy. ------------------------------------------------------------------------------------------------------------------------------------------------- Echocardiogram August 2021: EF 55 to 60% Current treatment: Cycle 1 day 8 dose dense Adriamycin and Cytoxan Chemo toxicities: 1.  Mild nausea: She took antinausea medications and she is feeling better. Reviewed her blood work and she appears to have had a nadir count a few days ago.  Her white count is actually elevated today.  Return to clinic in 1 week for cycle 2    No orders of the defined types were placed in this encounter.  The patient has a good understanding of the overall plan. she agrees with it. she will call with any problems that may develop before the next visit here.  Total time spent: 30 mins  including face to face time and time spent for planning, charting and coordination of care  Nicholas Lose, MD 11/23/2019  I, Cloyde Reams Dorshimer, am acting as scribe for Dr. Nicholas Lose.  I have reviewed the above documentation for accuracy and completeness, and I agree with the above.

## 2019-11-23 ENCOUNTER — Inpatient Hospital Stay (HOSPITAL_BASED_OUTPATIENT_CLINIC_OR_DEPARTMENT_OTHER): Payer: Medicaid Other | Admitting: Hematology and Oncology

## 2019-11-23 ENCOUNTER — Encounter: Payer: Self-pay | Admitting: *Deleted

## 2019-11-23 ENCOUNTER — Other Ambulatory Visit: Payer: Self-pay

## 2019-11-23 ENCOUNTER — Inpatient Hospital Stay: Payer: Medicaid Other

## 2019-11-23 DIAGNOSIS — C50412 Malignant neoplasm of upper-outer quadrant of left female breast: Secondary | ICD-10-CM

## 2019-11-23 DIAGNOSIS — Z5111 Encounter for antineoplastic chemotherapy: Secondary | ICD-10-CM | POA: Diagnosis not present

## 2019-11-23 DIAGNOSIS — Z170 Estrogen receptor positive status [ER+]: Secondary | ICD-10-CM | POA: Diagnosis not present

## 2019-11-23 LAB — CMP (CANCER CENTER ONLY)
ALT: 27 U/L (ref 0–44)
AST: 13 U/L — ABNORMAL LOW (ref 15–41)
Albumin: 3.7 g/dL (ref 3.5–5.0)
Alkaline Phosphatase: 177 U/L — ABNORMAL HIGH (ref 38–126)
Anion gap: 11 (ref 5–15)
BUN: 9 mg/dL (ref 6–20)
CO2: 18 mmol/L — ABNORMAL LOW (ref 22–32)
Calcium: 9.9 mg/dL (ref 8.9–10.3)
Chloride: 106 mmol/L (ref 98–111)
Creatinine: 0.75 mg/dL (ref 0.44–1.00)
GFR, Est AFR Am: >60 mL/min (ref >60)
GFR, Estimated: >60 mL/min (ref >60)
Glucose, Bld: 259 mg/dL — ABNORMAL HIGH (ref 70–99)
Potassium: 3.8 mmol/L (ref 3.5–5.1)
Sodium: 135 mmol/L (ref 135–145)
Total Bilirubin: 0.2 mg/dL — ABNORMAL LOW (ref 0.3–1.2)
Total Protein: 6.9 g/dL (ref 6.5–8.1)

## 2019-11-23 LAB — CBC WITH DIFFERENTIAL (CANCER CENTER ONLY)
Abs Immature Granulocytes: 0.67 10*3/uL — ABNORMAL HIGH (ref 0.00–0.07)
Basophils Absolute: 0.1 10*3/uL (ref 0.0–0.1)
Basophils Relative: 0 %
Eosinophils Absolute: 0.3 10*3/uL (ref 0.0–0.5)
Eosinophils Relative: 2 %
HCT: 41.4 % (ref 36.0–46.0)
Hemoglobin: 14.7 g/dL (ref 12.0–15.0)
Immature Granulocytes: 5 %
Lymphocytes Relative: 23 %
Lymphs Abs: 3 10*3/uL (ref 0.7–4.0)
MCH: 32.7 pg (ref 26.0–34.0)
MCHC: 35.5 g/dL (ref 30.0–36.0)
MCV: 92 fL (ref 80.0–100.0)
Monocytes Absolute: 1.2 10*3/uL — ABNORMAL HIGH (ref 0.1–1.0)
Monocytes Relative: 9 %
Neutro Abs: 8.2 10*3/uL — ABNORMAL HIGH (ref 1.7–7.7)
Neutrophils Relative %: 61 %
Platelet Count: 205 10*3/uL (ref 150–400)
RBC: 4.5 MIL/uL (ref 3.87–5.11)
RDW: 12.3 % (ref 11.5–15.5)
WBC Count: 13.5 10*3/uL — ABNORMAL HIGH (ref 4.0–10.5)
nRBC: 0 % (ref 0.0–0.2)

## 2019-11-23 NOTE — Assessment & Plan Note (Signed)
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 T2N0 stage Ib Oncotype DX score 32: 20% risk of distant recurrence in 9 years  Treatment plan:  1. Adjuvant chemotherapy with dose dense Adriamycin and Cytoxan x4 followed by Taxol weekly x12 2. followed by radiation therapy  3.  Followed by adjuvant antiestrogen therapy. ------------------------------------------------------------------------------------------------------------------------------------------------- Echocardiogram August 2021: EF 55 to 60% Current treatment: Cycle 1 day 8 dose dense Adriamycin and Cytoxan Chemo toxicities:  Return to clinic in 1 week for cycle 2

## 2019-11-24 ENCOUNTER — Telehealth: Payer: Self-pay | Admitting: Hematology and Oncology

## 2019-11-24 NOTE — Telephone Encounter (Signed)
No 8/18 los, no changes made to pt schedule   

## 2019-11-28 NOTE — Progress Notes (Signed)
Patient Care Team: Tonia Ghent, MD as PCP - General (Family Medicine) [name], RN as Registered Nurse [name], RN as Registered Nurse [name], RN as Oncology Nurse Navigator [name], RN as Oncology Nurse Navigator  DIAGNOSIS:    ICD-10-CM   1. Malignant neoplasm of upper-outer quadrant of left breast in female, estrogen receptor positive ([place])  C50.412    Z17.0     SUMMARY OF ONCOLOGIC HISTORY: Oncology History  Malignant neoplasm of upper-outer quadrant of left breast in female, estrogen receptor positive ([place])  08/25/2019 Initial Diagnosis   Palpable left breast mass x1 month. Diagnostic mammogram and [place] on 08/18/19 showed a 2.8cm mass at the 2 o'clock position, and one lymph node with mild cortical thickening in the left axilla. 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   10/18/2019 Cancer Staging   Staging form: Breast, AJCC 8th Edition - Pathologic stage from 10/18/2019: Stage IB (pT2, pN0(sn), cM0, G3, ER+, PR+, HER2-) - Signed by Nicholas Lose, MD on 10/18/2019   10/21/2019 Oncotype testing   32/20%   11/15/2019 -  Chemotherapy   The patient had dexamethasone (DECADRON) 4 MG tablet, 4 mg (100 % of original dose 4 mg), Oral, Daily, 1 of 1 cycle, Start date: 10/31/2019, End date: -- Dose modification: 4 mg (original dose 4 mg, Cycle 0) DOXOrubicin (ADRIAMYCIN) chemo injection 90 mg, 50 mg/m2 = 90 mg (83.3 % of original dose 60 mg/m2), Intravenous,  Once, 1 of 4 cycles Dose modification: 50 mg/m2 (original dose 60 mg/m2, Cycle 1, Reason: Provider Judgment) Administration: 90 mg (11/15/2019) palonosetron (ALOXI) injection 0.25 mg, 0.25 mg, Intravenous,  Once, 1 of 4  cycles Administration: 0.25 mg (11/15/2019) pegfilgrastim-jmdb (FULPHILA) injection 6 mg, 6 mg, Subcutaneous,  Once, 1 of 4 cycles Administration: 6 mg (11/17/2019) cyclophosphamide (CYTOXAN) 900 mg in sodium chloride 0.9 % 250 mL chemo infusion, 500 mg/m2 = 900 mg (83.3 % of original dose 600 mg/m2), Intravenous,  Once, 1 of 4 cycles Dose modification: 500 mg/m2 (original dose 600 mg/m2, Cycle 1, Reason: Provider Judgment) Administration: 900 mg (11/15/2019) PACLitaxel (TAXOL) 144 mg in sodium chloride 0.9 % 250 mL chemo infusion (</= 37m/m2), 80 mg/m2 = 144 mg, Intravenous,  Once, 0 of 12 cycles fosaprepitant (EMEND) 150 mg in sodium chloride 0.9 % 145 mL IVPB, 150 mg, Intravenous,  Once, 1 of 4 cycles Administration: 150 mg (11/15/2019)  for chemotherapy treatment.      CHIEF COMPLIANT: Cycle 2 Adriamycin and Cytoxan  INTERVAL HISTORY: Julia KINGDONis a 54y.o. with above-mentioned history of left breast cancer treated with lumpectomy and who is currently on adjuvant chemotherapy with dose dense Adriamycin and Cytoxan. She presents to the clinic today for a toxicity check and cycle 2.  she has noticed significant hair fall.  Denies any nausea vomiting.  ALLERGIES:  is allergic to propoxyphene n-acetaminophen, nabumetone, and rofecoxib.  MEDICATIONS:  Current Outpatient Medications  Medication Sig Dispense Refill  . albuterol (PROVENTIL) (2.5 MG/3ML) 0.083% nebulizer solution Take 3 mLs (2.5 mg total) by nebulization every 6 (six) hours as needed for wheezing or shortness of breath. 75 mL 1  . albuterol (VENTOLIN HFA) 108 (90 Base) MCG/ACT inhaler  Inhale 2 puffs into the lungs every 6 (six) hours as needed for wheezing or shortness of breath. 18 g 3  . dexamethasone (DECADRON) 4 MG tablet Take 1 tablet (4 mg total) by mouth daily. Take 1 tablet day after chemo and 1 tablet second day after chemo with food 8 tablet 0  . EPINEPHrine (PRIMATENE MIST IN) Inhale 1 puff into the lungs every 6  (six) hours as needed (wheezing/shortness of breath.).    Marland Kitchen gabapentin (NEURONTIN) 300 MG capsule Take up to 4 tabs a day (2 tabs twice a day) (Patient taking differently: Take 300 mg by mouth in the morning, at noon, and at bedtime. )    . HYDROcodone-acetaminophen (NORCO) 5-325 MG tablet Take 1-2 tablets by mouth every 6 (six) hours as needed for severe pain. Sedation caution 20 tablet 0  . HYDROcodone-acetaminophen (NORCO/VICODIN) 5-325 MG tablet Take 1-2 tablets by mouth every 6 (six) hours as needed for moderate pain or severe pain. 10 tablet 0  . ibuprofen (ADVIL) 600 MG tablet Take 1 tablet (600 mg total) by mouth every 8 (eight) hours as needed (For pain.  Take with food.). 90 tablet 2  . lidocaine-prilocaine (EMLA) cream Apply to affected area once 30 g 3  . omeprazole (PRILOSEC OTC) 20 MG tablet Take 20 mg by mouth daily.    . ondansetron (ZOFRAN) 8 MG tablet Take 1 tablet (8 mg total) by mouth 2 (two) times daily as needed. Start on the third day after chemotherapy. 30 tablet 1  . oxyCODONE (OXY IR/ROXICODONE) 5 MG immediate release tablet Take 1-2 tablets (5-10 mg total) by mouth every 6 (six) hours as needed for moderate pain, severe pain or breakthrough pain. 15 tablet 0  . prochlorperazine (COMPAZINE) 10 MG tablet Take 1 tablet (10 mg total) by mouth every 6 (six) hours as needed (Nausea or vomiting). 30 tablet 1  . traMADol (ULTRAM) 50 MG tablet Take 50 mg by mouth every 6 (six) hours as needed for pain.     No current facility-administered medications for this visit.    PHYSICAL EXAMINATION: ECOG PERFORMANCE STATUS: 1 - Symptomatic but completely ambulatory  Vitals:   11/29/19 1348  BP: 121/67  Pulse: 96  Resp: 19  Temp: (!) 97.3 F (36.3 C)  SpO2: 98%   Filed Weights   11/29/19 1348  Weight: 156 lb 4.8 oz (70.9 kg)    LABORATORY DATA:  I have reviewed the data as listed CMP Latest Ref Rng & Units 11/23/2019 11/15/2019 07/18/2019  Glucose 70 - 99 mg/dL 259(H) 135(H)  115(H)  BUN 6 - 20 mg/dL '9 12 7  ' Creatinine 0.44 - 1.00 mg/dL 0.75 0.74 0.64  Sodium 135 - 145 mmol/L 135 139 137  Potassium 3.5 - 5.1 mmol/L 3.8 3.9 4.5  Chloride 98 - 111 mmol/L 106 106 104  CO2 22 - 32 mmol/L 18(L) 22 26  Calcium 8.9 - 10.3 mg/dL 9.9 10.0 9.6  Total Protein 6.5 - 8.1 g/dL 6.9 7.5 -  Total Bilirubin 0.3 - 1.2 mg/dL 0.2(L) 0.7 -  Alkaline Phos 38 - 126 U/L 177(H) 122 -  AST 15 - 41 U/L 13(L) 27 -  ALT 0 - 44 U/L 27 48(H) -    Lab Results  Component Value Date   WBC 15.6 (H) 11/29/2019   HGB 15.0 11/29/2019   HCT 41.7 11/29/2019   MCV 92.3 11/29/2019   PLT 137 (L) 11/29/2019   NEUTROABS 10.6 (H) 11/29/2019    ASSESSMENT & PLAN:  Malignant neoplasm of upper-outer quadrant of left breast in female, estrogen receptor positive ([place]) 10/07/2019:Left lumpectomy and sentinel lymph node biopsy [name]): IDC with DCIS, 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 T2N0 stage Ib Oncotype DX score 32: 20% risk of distant recurrence in 9 years  Treatment plan:  1. Adjuvant chemotherapy with dose dense Adriamycin and Cytoxan x4 followed by Taxol weekly x12 2. followed by radiation therapy  3. Followed by adjuvant antiestrogen therapy. ------------------------------------------------------------------------------------------------------------------------------------------------- Echocardiogram August 2021: EF 55 to 60% Current treatment: Cycle 2 dose dense Adriamycin and Cytoxan Chemo toxicities: 1.  Mild nausea: Responded to antinausea medication 2. hair loss: I gave her prescription for wig  Return to clinic every 2 weeks for Adriamycin and Cytoxan    No orders of the defined types were placed in this encounter.  The patient has a good understanding of the overall plan. she agrees with it. she will call with any problems that may  develop before the next visit here.  Total time spent: 30 mins including face to face time and time spent for planning, charting and coordination of care  Nicholas Lose, MD 11/29/2019  I, Cloyde Reams Dorshimer, am acting as scribe for Dr. Nicholas Lose.  I have reviewed the above documentation for accuracy and completeness, and I agree with the above.

## 2019-11-29 ENCOUNTER — Other Ambulatory Visit: Payer: Self-pay

## 2019-11-29 ENCOUNTER — Inpatient Hospital Stay (HOSPITAL_BASED_OUTPATIENT_CLINIC_OR_DEPARTMENT_OTHER): Payer: Medicaid Other | Admitting: Hematology and Oncology

## 2019-11-29 ENCOUNTER — Other Ambulatory Visit: Payer: Self-pay | Admitting: Hematology and Oncology

## 2019-11-29 ENCOUNTER — Encounter: Payer: Self-pay | Admitting: *Deleted

## 2019-11-29 ENCOUNTER — Inpatient Hospital Stay: Payer: Medicaid Other

## 2019-11-29 DIAGNOSIS — C50412 Malignant neoplasm of upper-outer quadrant of left female breast: Secondary | ICD-10-CM

## 2019-11-29 DIAGNOSIS — Z170 Estrogen receptor positive status [ER+]: Secondary | ICD-10-CM

## 2019-11-29 DIAGNOSIS — Z95828 Presence of other vascular implants and grafts: Secondary | ICD-10-CM

## 2019-11-29 DIAGNOSIS — Z5111 Encounter for antineoplastic chemotherapy: Secondary | ICD-10-CM | POA: Diagnosis not present

## 2019-11-29 LAB — CBC WITH DIFFERENTIAL (CANCER CENTER ONLY)
Abs Immature Granulocytes: 1.09 10*3/uL — ABNORMAL HIGH (ref 0.00–0.07)
Basophils Absolute: 0.1 10*3/uL (ref 0.0–0.1)
Basophils Relative: 1 %
Eosinophils Absolute: 0.1 10*3/uL (ref 0.0–0.5)
Eosinophils Relative: 1 %
HCT: 41.7 % (ref 36.0–46.0)
Hemoglobin: 15 g/dL (ref 12.0–15.0)
Immature Granulocytes: 7 %
Lymphocytes Relative: 20 %
Lymphs Abs: 3.1 10*3/uL (ref 0.7–4.0)
MCH: 33.2 pg (ref 26.0–34.0)
MCHC: 36 g/dL (ref 30.0–36.0)
MCV: 92.3 fL (ref 80.0–100.0)
Monocytes Absolute: 0.7 10*3/uL (ref 0.1–1.0)
Monocytes Relative: 4 %
Neutro Abs: 10.6 10*3/uL — ABNORMAL HIGH (ref 1.7–7.7)
Neutrophils Relative %: 67 %
Platelet Count: 137 10*3/uL — ABNORMAL LOW (ref 150–400)
RBC: 4.52 MIL/uL (ref 3.87–5.11)
RDW: 12.7 % (ref 11.5–15.5)
WBC Count: 15.6 10*3/uL — ABNORMAL HIGH (ref 4.0–10.5)
nRBC: 0 % (ref 0.0–0.2)

## 2019-11-29 LAB — CMP (CANCER CENTER ONLY)
ALT: 70 U/L — ABNORMAL HIGH (ref 0–44)
AST: 36 U/L (ref 15–41)
Albumin: 3.9 g/dL (ref 3.5–5.0)
Alkaline Phosphatase: 169 U/L — ABNORMAL HIGH (ref 38–126)
Anion gap: 22 — ABNORMAL HIGH (ref 5–15)
BUN: 10 mg/dL (ref 6–20)
CO2: 14 mmol/L — ABNORMAL LOW (ref 22–32)
Calcium: 10.3 mg/dL (ref 8.9–10.3)
Chloride: 101 mmol/L (ref 98–111)
Creatinine: 0.68 mg/dL (ref 0.44–1.00)
GFR, Est AFR Am: >60 mL/min (ref >60)
GFR, Estimated: >60 mL/min (ref >60)
Glucose, Bld: 127 mg/dL — ABNORMAL HIGH (ref 70–99)
Potassium: 4.1 mmol/L (ref 3.5–5.1)
Sodium: 137 mmol/L (ref 135–145)
Total Bilirubin: 0.3 mg/dL (ref 0.3–1.2)
Total Protein: 7.9 g/dL (ref 6.5–8.1)

## 2019-11-29 MED ORDER — SODIUM CHLORIDE 0.9% FLUSH
10.0000 mL | INTRAVENOUS | Status: DC | PRN
  Administered 2019-11-29: 10 mL
  Filled 2019-11-29: qty 10

## 2019-11-29 MED ORDER — SODIUM CHLORIDE 0.9% FLUSH
10.0000 mL | Freq: Once | INTRAVENOUS | Status: AC
  Administered 2019-11-29: 10 mL
  Filled 2019-11-29: qty 10

## 2019-11-29 MED ORDER — PALONOSETRON HCL INJECTION 0.25 MG/5ML
0.2500 mg | Freq: Once | INTRAVENOUS | Status: AC
  Administered 2019-11-29: 0.25 mg via INTRAVENOUS

## 2019-11-29 MED ORDER — SODIUM CHLORIDE 0.9 % IV SOLN
10.0000 mg | Freq: Once | INTRAVENOUS | Status: AC
  Administered 2019-11-29: 10 mg via INTRAVENOUS
  Filled 2019-11-29: qty 10

## 2019-11-29 MED ORDER — SODIUM CHLORIDE 0.9 % IV SOLN
500.0000 mg/m2 | Freq: Once | INTRAVENOUS | Status: AC
  Administered 2019-11-29: 900 mg via INTRAVENOUS
  Filled 2019-11-29: qty 45

## 2019-11-29 MED ORDER — DOXORUBICIN HCL CHEMO IV INJECTION 2 MG/ML
50.0000 mg/m2 | Freq: Once | INTRAVENOUS | Status: AC
  Administered 2019-11-29: 90 mg via INTRAVENOUS
  Filled 2019-11-29: qty 45

## 2019-11-29 MED ORDER — TRAMADOL HCL 50 MG PO TABS
50.0000 mg | ORAL_TABLET | Freq: Four times a day (QID) | ORAL | 0 refills | Status: DC | PRN
Start: 2019-11-29 — End: 2020-01-09

## 2019-11-29 MED ORDER — HEPARIN SOD (PORK) LOCK FLUSH 100 UNIT/ML IV SOLN
500.0000 [IU] | Freq: Once | INTRAVENOUS | Status: AC | PRN
  Administered 2019-11-29: 500 [IU]
  Filled 2019-11-29: qty 5

## 2019-11-29 MED ORDER — PALONOSETRON HCL INJECTION 0.25 MG/5ML
INTRAVENOUS | Status: AC
  Filled 2019-11-29: qty 5

## 2019-11-29 MED ORDER — SODIUM CHLORIDE 0.9 % IV SOLN
Freq: Once | INTRAVENOUS | Status: AC
  Filled 2019-11-29: qty 250

## 2019-11-29 MED ORDER — SODIUM CHLORIDE 0.9 % IV SOLN
150.0000 mg | Freq: Once | INTRAVENOUS | Status: AC
  Administered 2019-11-29: 150 mg via INTRAVENOUS
  Filled 2019-11-29: qty 150

## 2019-11-29 NOTE — Patient Instructions (Signed)
[facility] Discharge Instructions for Patients Receiving Chemotherapy  Today you received the following chemotherapy agents: Doxorubicin (Adriamycin) and Cytoxan  To help prevent nausea and vomiting after your treatment, we encourage you to take your nausea medication as directed by your MD.   If you develop nausea and vomiting that is not controlled by your nausea medication, call the clinic.   BELOW ARE SYMPTOMS THAT SHOULD BE REPORTED IMMEDIATELY:  *FEVER GREATER THAN 100.5 F  *CHILLS WITH OR WITHOUT FEVER  NAUSEA AND VOMITING THAT IS NOT CONTROLLED WITH YOUR NAUSEA MEDICATION  *UNUSUAL SHORTNESS OF BREATH  *UNUSUAL BRUISING OR BLEEDING  TENDERNESS IN MOUTH AND THROAT WITH OR WITHOUT PRESENCE OF ULCERS  *URINARY PROBLEMS  *BOWEL PROBLEMS  UNUSUAL RASH Items with * indicate a potential emergency and should be followed up as soon as possible.  Feel free to call the clinic should you have any questions or concerns. The clinic phone number is [phone].  Please show the CHEMO ALERT CARD at check-in to the Emergency Department and triage nurse.   

## 2019-11-29 NOTE — Assessment & Plan Note (Signed)
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 T2N0 stage Ib Oncotype DX score 32: 20% risk of distant recurrence in 9 years  Treatment plan:  1. Adjuvant chemotherapy with dose dense Adriamycin and Cytoxan x4 followed by Taxol weekly x12 2. followed by radiation therapy  3. Followed by adjuvant antiestrogen therapy. ------------------------------------------------------------------------------------------------------------------------------------------------- Echocardiogram August 2021: EF 55 to 60% Current treatment: Cycle 2 dose dense Adriamycin and Cytoxan Chemo toxicities: 1.  Mild nausea: Responded to antinausea medication   Return to clinic every 2 weeks for Adriamycin and Cytoxan

## 2019-11-29 NOTE — Progress Notes (Signed)
Blood return noted before, during, and after Doxorubicin IV Injection. 

## 2019-11-30 ENCOUNTER — Telehealth: Payer: Self-pay | Admitting: Hematology and Oncology

## 2019-11-30 NOTE — Telephone Encounter (Signed)
No 8/24 los. No changes made to pt's schedule.  

## 2019-12-01 ENCOUNTER — Other Ambulatory Visit: Payer: Self-pay | Admitting: *Deleted

## 2019-12-01 ENCOUNTER — Other Ambulatory Visit: Payer: Self-pay

## 2019-12-01 ENCOUNTER — Inpatient Hospital Stay: Payer: Medicaid Other

## 2019-12-01 VITALS — BP 115/62 | HR 77 | Temp 98.7°F | Resp 18

## 2019-12-01 DIAGNOSIS — Z170 Estrogen receptor positive status [ER+]: Secondary | ICD-10-CM

## 2019-12-01 DIAGNOSIS — Z5111 Encounter for antineoplastic chemotherapy: Secondary | ICD-10-CM | POA: Diagnosis not present

## 2019-12-01 DIAGNOSIS — C50412 Malignant neoplasm of upper-outer quadrant of left female breast: Secondary | ICD-10-CM

## 2019-12-01 MED ORDER — CIPROFLOXACIN HCL 500 MG PO TABS: 500.0000 mg | ORAL_TABLET | Freq: Two times a day (BID) | ORAL | 0 refills | Status: DC

## 2019-12-01 MED ORDER — PEGFILGRASTIM-JMDB 6 MG/0.6ML ~~LOC~~ SOSY
PREFILLED_SYRINGE | SUBCUTANEOUS | Status: AC
  Filled 2019-12-01: qty 0.6

## 2019-12-01 MED ORDER — PEGFILGRASTIM-JMDB 6 MG/0.6ML ~~LOC~~ SOSY
6.0000 mg | PREFILLED_SYRINGE | Freq: Once | SUBCUTANEOUS | Status: AC
  Administered 2019-12-01: 6 mg via SUBCUTANEOUS

## 2019-12-01 NOTE — Progress Notes (Signed)
Received call from pt with complaint of burning with urination and fullness in the bladder x3 days. Pt denies fever.  Per MD pt to be prescribed Cipro 500 mg BID x 7 days and to take OTC pyridium as directed on the box.  Pt notified and educated to call the office if symptoms do not improve.   Pt verbalized understanding.

## 2019-12-02 ENCOUNTER — Encounter: Payer: Self-pay | Admitting: Hematology and Oncology

## 2019-12-02 NOTE — Progress Notes (Signed)
Pt is approved for the $1000 [employer] grant.  

## 2019-12-12 NOTE — Progress Notes (Signed)
Patient Care Team: Tonia Ghent, MD as PCP - General (Family Medicine) [name], RN as Registered Nurse [name], RN as Registered Nurse [name], RN as Oncology Nurse Navigator [name], RN as Oncology Nurse Navigator  DIAGNOSIS:    ICD-10-CM   1. Malignant neoplasm of upper-outer quadrant of left breast in female, estrogen receptor positive ([place])  C50.412    Z17.0     SUMMARY OF ONCOLOGIC HISTORY: Oncology History  Malignant neoplasm of upper-outer quadrant of left breast in female, estrogen receptor positive ([place])  08/25/2019 Initial Diagnosis   Palpable left breast mass x1 month. Diagnostic mammogram and [place] on 08/18/19 showed a 2.8cm mass at the 2 o'clock position, and one lymph node with mild cortical thickening in the left axilla. 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   10/18/2019 Cancer Staging   Staging form: Breast, AJCC 8th Edition - Pathologic stage from 10/18/2019: Stage IB (pT2, pN0(sn), cM0, G3, ER+, PR+, HER2-) - Signed by Nicholas Lose, MD on 10/18/2019   10/21/2019 Oncotype testing   32/20%   11/15/2019 -  Chemotherapy   The patient had dexamethasone (DECADRON) 4 MG tablet, 4 mg (100 % of original dose 4 mg), Oral, Daily, 1 of 1 cycle, Start date: 10/31/2019, End date: -- Dose modification: 4 mg (original dose 4 mg, Cycle 0) DOXOrubicin (ADRIAMYCIN) chemo injection 90 mg, 50 mg/m2 = 90 mg (83.3 % of original dose 60 mg/m2), Intravenous,  Once, 2 of 4 cycles Dose modification: 50 mg/m2 (original dose 60 mg/m2, Cycle 1, Reason: Provider Judgment) Administration: 90 mg (11/15/2019), 90 mg (11/29/2019) palonosetron (ALOXI) injection 0.25 mg, 0.25 mg, Intravenous,   Once, 2 of 4 cycles Administration: 0.25 mg (11/15/2019), 0.25 mg (11/29/2019) pegfilgrastim-jmdb (FULPHILA) injection 6 mg, 6 mg, Subcutaneous,  Once, 2 of 4 cycles Administration: 6 mg (11/17/2019), 6 mg (12/01/2019) cyclophosphamide (CYTOXAN) 900 mg in sodium chloride 0.9 % 250 mL chemo infusion, 500 mg/m2 = 900 mg (83.3 % of original dose 600 mg/m2), Intravenous,  Once, 2 of 4 cycles Dose modification: 500 mg/m2 (original dose 600 mg/m2, Cycle 1, Reason: Provider Judgment) Administration: 900 mg (11/15/2019), 900 mg (11/29/2019) PACLitaxel (TAXOL) 144 mg in sodium chloride 0.9 % 250 mL chemo infusion (</= 91m/m2), 80 mg/m2 = 144 mg, Intravenous,  Once, 0 of 12 cycles fosaprepitant (EMEND) 150 mg in sodium chloride 0.9 % 145 mL IVPB, 150 mg, Intravenous,  Once, 2 of 4 cycles Administration: 150 mg (11/15/2019), 150 mg (11/29/2019)  for chemotherapy treatment.      CHIEF COMPLIANT: Cycle 3Adriamycin and Cytoxan  INTERVAL HISTORY: KDAUNA ZISKAis a 54y.o. with above-mentioned history of left breast cancer treated with lumpectomy andwho is currently on adjuvantchemotherapy with dose dense Adriamycin and Cytoxan. She presents to the clinic today for a toxicity check and cycle 3.  She had mild nausea issues which are well controlled with antinausea medications.  She lost her hair.  ALLERGIES:  is allergic to propoxyphene n-acetaminophen, nabumetone, and rofecoxib.  MEDICATIONS:  Current Outpatient Medications  Medication Sig Dispense Refill  . albuterol (PROVENTIL) (2.5 MG/3ML) 0.083% nebulizer solution Take 3 mLs (2.5 mg total) by nebulization every 6 (six) hours as needed for  wheezing or shortness of breath. 75 mL 1  . albuterol (VENTOLIN HFA) 108 (90 Base) MCG/ACT inhaler Inhale 2 puffs into the lungs every 6 (six) hours as needed for wheezing or shortness of breath. 18 g 3  . ciprofloxacin (CIPRO) 500 MG tablet Take 1 tablet (500 mg total) by mouth 2 (two) times daily. 14 tablet 0  .  dexamethasone (DECADRON) 4 MG tablet Take 1 tablet (4 mg total) by mouth daily. Take 1 tablet day after chemo and 1 tablet second day after chemo with food 8 tablet 0  . EPINEPHrine (PRIMATENE MIST IN) Inhale 1 puff into the lungs every 6 (six) hours as needed (wheezing/shortness of breath.).    Marland Kitchen gabapentin (NEURONTIN) 300 MG capsule Take up to 4 tabs a day (2 tabs twice a day) (Patient taking differently: Take 300 mg by mouth in the morning, at noon, and at bedtime. )    . HYDROcodone-acetaminophen (NORCO) 5-325 MG tablet Take 1-2 tablets by mouth every 6 (six) hours as needed for severe pain. Sedation caution 20 tablet 0  . HYDROcodone-acetaminophen (NORCO/VICODIN) 5-325 MG tablet Take 1-2 tablets by mouth every 6 (six) hours as needed for moderate pain or severe pain. 10 tablet 0  . ibuprofen (ADVIL) 600 MG tablet Take 1 tablet (600 mg total) by mouth every 8 (eight) hours as needed (For pain.  Take with food.). 90 tablet 2  . lidocaine-prilocaine (EMLA) cream Apply to affected area once 30 g 3  . omeprazole (PRILOSEC OTC) 20 MG tablet Take 20 mg by mouth daily.    . ondansetron (ZOFRAN) 8 MG tablet Take 1 tablet (8 mg total) by mouth 2 (two) times daily as needed. Start on the third day after chemotherapy. 30 tablet 1  . oxyCODONE (OXY IR/ROXICODONE) 5 MG immediate release tablet Take 1-2 tablets (5-10 mg total) by mouth every 6 (six) hours as needed for moderate pain, severe pain or breakthrough pain. 15 tablet 0  . prochlorperazine (COMPAZINE) 10 MG tablet Take 1 tablet (10 mg total) by mouth every 6 (six) hours as needed (Nausea or vomiting). 30 tablet 1  . traMADol (ULTRAM) 50 MG tablet Take 1 tablet (50 mg total) by mouth every 6 (six) hours as needed. 60 tablet 0   No current facility-administered medications for this visit.    PHYSICAL EXAMINATION: ECOG PERFORMANCE STATUS: 1 - Symptomatic but completely ambulatory  Vitals:   12/13/19 1347  BP: 112/68  Pulse: 88  Resp: 18  Temp:  (!) 96.1 F (35.6 C)  SpO2: 98%   Filed Weights   12/13/19 1347  Weight: 157 lb 6.4 oz (71.4 kg)     LABORATORY DATA:  I have reviewed the data as listed CMP Latest Ref Rng & Units 12/13/2019 11/29/2019 11/23/2019  Glucose 70 - 99 mg/dL 238(H) 127(H) 259(H)  BUN 6 - 20 mg/dL 5(L) 10 9  Creatinine 0.44 - 1.00 mg/dL 0.71 0.68 0.75  Sodium 135 - 145 mmol/L 133(L) 137 135  Potassium 3.5 - 5.1 mmol/L 4.1 4.1 3.8  Chloride 98 - 111 mmol/L 99 101 106  CO2 22 - 32 mmol/L 18(L) 14(L) 18(L)  Calcium 8.9 - 10.3 mg/dL 9.8 10.3 9.9  Total Protein 6.5 - 8.1 g/dL 8.1 7.9 6.9  Total Bilirubin 0.3 - 1.2 mg/dL 0.3 0.3 0.2(L)  Alkaline Phos 38 - 126 U/L 195(H) 169(H) 177(H)  AST 15 - 41 U/L 33 36 13(L)  ALT 0 - 44 U/L 67(H) 70(H) 27    Lab Results  Component Value Date   WBC 17.9 (H) 12/13/2019   HGB 14.2 12/13/2019   HCT 39.5 12/13/2019   MCV 93.2 12/13/2019   PLT 156 12/13/2019   NEUTROABS 13.1 (H) 12/13/2019    ASSESSMENT & PLAN:  Malignant neoplasm of upper-outer quadrant of left breast in female, estrogen receptor positive ([place]) 10/07/2019:Left lumpectomy and sentinel lymph node biopsy [name]): IDC with DCIS, 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 T2N0 stage Ib Oncotype DX score 32: 20% risk of distant recurrence in 9 years  Treatment plan:  1. Adjuvant chemotherapy with dose dense Adriamycin and Cytoxan x4 followed by Taxol weekly x12 2. followed by radiation therapy  3. Followed by adjuvant antiestrogen therapy. ------------------------------------------------------------------------------------------------------------------------------------------------- Echocardiogram August 2021: EF 55 to 60% Current treatment:Cycle 3 dose dense Adriamycin and Cytoxan Chemo toxicities: 1.Mild nausea: Responded to antinausea medication 2. hair loss:  Secondary to chemo  Return to clinic every 2 weeks for cycle 4 Adriamycin and Cytoxan    No orders of the defined types were placed in this encounter.  The patient has a good understanding of the overall plan. she agrees with it. she will call with any problems that may develop before the next visit here.  Total time spent: 30 mins including face to face time and time spent for planning, charting and coordination of care  Nicholas Lose, MD 12/13/2019  I, Cloyde Reams Dorshimer, am acting as scribe for Dr. Nicholas Lose.  I have reviewed the above documentation for accuracy and completeness, and I agree with the above.

## 2019-12-13 ENCOUNTER — Inpatient Hospital Stay: Payer: Medicaid Other | Attending: Hematology and Oncology

## 2019-12-13 ENCOUNTER — Inpatient Hospital Stay (HOSPITAL_BASED_OUTPATIENT_CLINIC_OR_DEPARTMENT_OTHER): Payer: Medicaid Other | Admitting: Hematology and Oncology

## 2019-12-13 ENCOUNTER — Other Ambulatory Visit: Payer: Self-pay

## 2019-12-13 ENCOUNTER — Inpatient Hospital Stay: Payer: Medicaid Other

## 2019-12-13 DIAGNOSIS — Z5111 Encounter for antineoplastic chemotherapy: Secondary | ICD-10-CM | POA: Insufficient documentation

## 2019-12-13 DIAGNOSIS — C50412 Malignant neoplasm of upper-outer quadrant of left female breast: Secondary | ICD-10-CM | POA: Insufficient documentation

## 2019-12-13 DIAGNOSIS — Z170 Estrogen receptor positive status [ER+]: Secondary | ICD-10-CM

## 2019-12-13 DIAGNOSIS — R110 Nausea: Secondary | ICD-10-CM | POA: Insufficient documentation

## 2019-12-13 DIAGNOSIS — Z9221 Personal history of antineoplastic chemotherapy: Secondary | ICD-10-CM | POA: Insufficient documentation

## 2019-12-13 DIAGNOSIS — Z95828 Presence of other vascular implants and grafts: Secondary | ICD-10-CM

## 2019-12-13 DIAGNOSIS — Z79899 Other long term (current) drug therapy: Secondary | ICD-10-CM | POA: Diagnosis not present

## 2019-12-13 DIAGNOSIS — Z5189 Encounter for other specified aftercare: Secondary | ICD-10-CM | POA: Diagnosis not present

## 2019-12-13 LAB — CMP (CANCER CENTER ONLY)
ALT: 67 U/L — ABNORMAL HIGH (ref 0–44)
AST: 33 U/L (ref 15–41)
Albumin: 3.8 g/dL (ref 3.5–5.0)
Alkaline Phosphatase: 195 U/L — ABNORMAL HIGH (ref 38–126)
Anion gap: 16 — ABNORMAL HIGH (ref 5–15)
BUN: 5 mg/dL — ABNORMAL LOW (ref 6–20)
CO2: 18 mmol/L — ABNORMAL LOW (ref 22–32)
Calcium: 9.8 mg/dL (ref 8.9–10.3)
Chloride: 99 mmol/L (ref 98–111)
Creatinine: 0.71 mg/dL (ref 0.44–1.00)
GFR, Est AFR Am: >60 mL/min (ref >60)
GFR, Estimated: >60 mL/min (ref >60)
Glucose, Bld: 238 mg/dL — ABNORMAL HIGH (ref 70–99)
Potassium: 4.1 mmol/L (ref 3.5–5.1)
Sodium: 133 mmol/L — ABNORMAL LOW (ref 135–145)
Total Bilirubin: 0.3 mg/dL (ref 0.3–1.2)
Total Protein: 8.1 g/dL (ref 6.5–8.1)

## 2019-12-13 LAB — CBC WITH DIFFERENTIAL (CANCER CENTER ONLY)
Abs Immature Granulocytes: 1.4 10*3/uL — ABNORMAL HIGH (ref 0.00–0.07)
Basophils Absolute: 0.2 10*3/uL — ABNORMAL HIGH (ref 0.0–0.1)
Basophils Relative: 1 %
Eosinophils Absolute: 0.1 10*3/uL (ref 0.0–0.5)
Eosinophils Relative: 0 %
HCT: 39.5 % (ref 36.0–46.0)
Hemoglobin: 14.2 g/dL (ref 12.0–15.0)
Immature Granulocytes: 8 %
Lymphocytes Relative: 13 %
Lymphs Abs: 2.3 10*3/uL (ref 0.7–4.0)
MCH: 33.5 pg (ref 26.0–34.0)
MCHC: 35.9 g/dL (ref 30.0–36.0)
MCV: 93.2 fL (ref 80.0–100.0)
Monocytes Absolute: 0.9 10*3/uL (ref 0.1–1.0)
Monocytes Relative: 5 %
Neutro Abs: 13.1 10*3/uL — ABNORMAL HIGH (ref 1.7–7.7)
Neutrophils Relative %: 73 %
Platelet Count: 156 10*3/uL (ref 150–400)
RBC: 4.24 MIL/uL (ref 3.87–5.11)
RDW: 13.8 % (ref 11.5–15.5)
WBC Count: 17.9 10*3/uL — ABNORMAL HIGH (ref 4.0–10.5)
nRBC: 0.1 % (ref 0.0–0.2)

## 2019-12-13 MED ORDER — DOXORUBICIN HCL CHEMO IV INJECTION 2 MG/ML
50.0000 mg/m2 | Freq: Once | INTRAVENOUS | Status: AC
  Administered 2019-12-13: 90 mg via INTRAVENOUS
  Filled 2019-12-13: qty 45

## 2019-12-13 MED ORDER — SODIUM CHLORIDE 0.9 % IV SOLN
150.0000 mg | Freq: Once | INTRAVENOUS | Status: AC
  Administered 2019-12-13: 150 mg via INTRAVENOUS
  Filled 2019-12-13: qty 150

## 2019-12-13 MED ORDER — PALONOSETRON HCL INJECTION 0.25 MG/5ML
0.2500 mg | Freq: Once | INTRAVENOUS | Status: AC
  Administered 2019-12-13: 0.25 mg via INTRAVENOUS

## 2019-12-13 MED ORDER — SODIUM CHLORIDE 0.9 % IV SOLN
Freq: Once | INTRAVENOUS | Status: AC
  Filled 2019-12-13: qty 250

## 2019-12-13 MED ORDER — HEPARIN SOD (PORK) LOCK FLUSH 100 UNIT/ML IV SOLN
500.0000 [IU] | Freq: Once | INTRAVENOUS | Status: AC | PRN
  Administered 2019-12-13: 500 [IU]
  Filled 2019-12-13: qty 5

## 2019-12-13 MED ORDER — PALONOSETRON HCL INJECTION 0.25 MG/5ML
INTRAVENOUS | Status: AC
  Filled 2019-12-13: qty 5

## 2019-12-13 MED ORDER — SODIUM CHLORIDE 0.9 % IV SOLN
10.0000 mg | Freq: Once | INTRAVENOUS | Status: AC
  Administered 2019-12-13: 10 mg via INTRAVENOUS
  Filled 2019-12-13: qty 10

## 2019-12-13 MED ORDER — SODIUM CHLORIDE 0.9% FLUSH
10.0000 mL | Freq: Once | INTRAVENOUS | Status: AC
  Administered 2019-12-13: 10 mL
  Filled 2019-12-13: qty 10

## 2019-12-13 MED ORDER — SODIUM CHLORIDE 0.9 % IV SOLN
500.0000 mg/m2 | Freq: Once | INTRAVENOUS | Status: AC
  Administered 2019-12-13: 900 mg via INTRAVENOUS
  Filled 2019-12-13: qty 45

## 2019-12-13 MED ORDER — SODIUM CHLORIDE 0.9% FLUSH
10.0000 mL | INTRAVENOUS | Status: DC | PRN
  Administered 2019-12-13: 10 mL
  Filled 2019-12-13: qty 10

## 2019-12-13 NOTE — Progress Notes (Signed)
Blood return noted before, during, and after adriamycin administration.

## 2019-12-13 NOTE — Patient Instructions (Signed)
[facility] Discharge Instructions for Patients Receiving Chemotherapy  Today you received the following chemotherapy agents Adriamycin; Cytoxan  To help prevent nausea and vomiting after your treatment, we encourage you to take your nausea medication as directed   If you develop nausea and vomiting that is not controlled by your nausea medication, call the clinic.   BELOW ARE SYMPTOMS THAT SHOULD BE REPORTED IMMEDIATELY:  *FEVER GREATER THAN 100.5 F  *CHILLS WITH OR WITHOUT FEVER  NAUSEA AND VOMITING THAT IS NOT CONTROLLED WITH YOUR NAUSEA MEDICATION  *UNUSUAL SHORTNESS OF BREATH  *UNUSUAL BRUISING OR BLEEDING  TENDERNESS IN MOUTH AND THROAT WITH OR WITHOUT PRESENCE OF ULCERS  *URINARY PROBLEMS  *BOWEL PROBLEMS  UNUSUAL RASH Items with * indicate a potential emergency and should be followed up as soon as possible.  Feel free to call the clinic should you have any questions or concerns. The clinic phone number is [phone].  Please show the CHEMO ALERT CARD at check-in to the Emergency Department and triage nurse.   

## 2019-12-13 NOTE — Assessment & Plan Note (Signed)
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 T2N0 stage Ib Oncotype DX score 32: 20% risk of distant recurrence in 9 years  Treatment plan:  1. Adjuvant chemotherapy with dose dense Adriamycin and Cytoxan x4 followed by Taxol weekly x12 2. followed by radiation therapy  3. Followed by adjuvant antiestrogen therapy. ------------------------------------------------------------------------------------------------------------------------------------------------- Echocardiogram August 2021: EF 55 to 60% Current treatment:Cycle 3 dose dense Adriamycin and Cytoxan Chemo toxicities: 1.Mild nausea: Responded to antinausea medication 2. hair loss: I gave her prescription for wig  Return to clinic every 2 weeks for cycle 4 Adriamycin and Cytoxan

## 2019-12-14 ENCOUNTER — Telehealth: Payer: Self-pay | Admitting: Hematology and Oncology

## 2019-12-14 NOTE — Telephone Encounter (Signed)
No 9/7 los, no changes made to pt schedule

## 2019-12-15 ENCOUNTER — Other Ambulatory Visit: Payer: Self-pay

## 2019-12-15 ENCOUNTER — Inpatient Hospital Stay: Payer: Medicaid Other

## 2019-12-15 VITALS — BP 116/68 | HR 65 | Resp 18

## 2019-12-15 DIAGNOSIS — Z5111 Encounter for antineoplastic chemotherapy: Secondary | ICD-10-CM | POA: Diagnosis not present

## 2019-12-15 DIAGNOSIS — Z170 Estrogen receptor positive status [ER+]: Secondary | ICD-10-CM

## 2019-12-15 MED ORDER — PEGFILGRASTIM-JMDB 6 MG/0.6ML ~~LOC~~ SOSY
6.0000 mg | PREFILLED_SYRINGE | Freq: Once | SUBCUTANEOUS | Status: AC
  Administered 2019-12-15: 6 mg via SUBCUTANEOUS

## 2019-12-15 MED ORDER — PEGFILGRASTIM-JMDB 6 MG/0.6ML ~~LOC~~ SOSY
PREFILLED_SYRINGE | SUBCUTANEOUS | Status: AC
  Filled 2019-12-15: qty 0.6

## 2019-12-15 NOTE — Patient Instructions (Signed)

## 2019-12-26 NOTE — Progress Notes (Signed)
Patient Care Team: Tonia Ghent, MD as PCP - General (Family Medicine) [name], RN as Registered Nurse [name], RN as Registered Nurse [name], RN as Oncology Nurse Navigator [name], RN as Oncology Nurse Navigator  DIAGNOSIS:    ICD-10-CM   1. Malignant neoplasm of upper-outer quadrant of left breast in female, estrogen receptor positive ([place])  C50.412    Z17.0     SUMMARY OF ONCOLOGIC HISTORY: Oncology History  Malignant neoplasm of upper-outer quadrant of left breast in female, estrogen receptor positive ([place])  08/25/2019 Initial Diagnosis   Palpable left breast mass x1 month. Diagnostic mammogram and [place] on 08/18/19 showed a 2.8cm mass at the 2 o'clock position, and one lymph node with mild cortical thickening in the left axilla. 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   10/18/2019 Cancer Staging   Staging form: Breast, AJCC 8th Edition - Pathologic stage from 10/18/2019: Stage IB (pT2, pN0(sn), cM0, G3, ER+, PR+, HER2-) - Signed by Nicholas Lose, MD on 10/18/2019   10/21/2019 Oncotype testing   32/20%   11/15/2019 -  Chemotherapy   The patient had dexamethasone (DECADRON) 4 MG tablet, 4 mg (100 % of original dose 4 mg), Oral, Daily, 1 of 1 cycle, Start date: 10/31/2019, End date: -- Dose modification: 4 mg (original dose 4 mg, Cycle 0) DOXOrubicin (ADRIAMYCIN) chemo injection 90 mg, 50 mg/m2 = 90 mg (83.3 % of original dose 60 mg/m2), Intravenous,  Once, 3 of 4 cycles Dose modification: 50 mg/m2 (original dose 60 mg/m2, Cycle 1, Reason: Provider Judgment) Administration: 90 mg (11/15/2019), 90 mg (11/29/2019), 90 mg (12/13/2019) palonosetron (ALOXI) injection 0.25 mg, 0.25  mg, Intravenous,  Once, 3 of 4 cycles Administration: 0.25 mg (11/15/2019), 0.25 mg (11/29/2019), 0.25 mg (12/13/2019) pegfilgrastim-jmdb (FULPHILA) injection 6 mg, 6 mg, Subcutaneous,  Once, 3 of 4 cycles Administration: 6 mg (11/17/2019), 6 mg (12/01/2019) cyclophosphamide (CYTOXAN) 900 mg in sodium chloride 0.9 % 250 mL chemo infusion, 500 mg/m2 = 900 mg (83.3 % of original dose 600 mg/m2), Intravenous,  Once, 3 of 4 cycles Dose modification: 500 mg/m2 (original dose 600 mg/m2, Cycle 1, Reason: Provider Judgment) Administration: 900 mg (11/15/2019), 900 mg (11/29/2019), 900 mg (12/13/2019) PACLitaxel (TAXOL) 144 mg in sodium chloride 0.9 % 250 mL chemo infusion (</= 43m/m2), 80 mg/m2 = 144 mg, Intravenous,  Once, 0 of 12 cycles fosaprepitant (EMEND) 150 mg in sodium chloride 0.9 % 145 mL IVPB, 150 mg, Intravenous,  Once, 3 of 4 cycles Administration: 150 mg (11/15/2019), 150 mg (11/29/2019), 150 mg (12/13/2019)  for chemotherapy treatment.      CHIEF COMPLIANT: Cycle4Adriamycin and Cytoxan  INTERVAL HISTORY: Julia REINHEIMERis a 54y.o. with above-mentioned history of left breast cancer treated with lumpectomy andwho is currently on adjuvantchemotherapy with dose dense Adriamycin and Cytoxan. She presents to the clinic today for a toxicity checkandcycle4.  She is tolerating chemo extremely well.  She had one episode of nausea after her injection appointment.  Taste is not great but she is able to keep up her oral intake.  ALLERGIES:  is allergic to propoxyphene n-acetaminophen, nabumetone, and rofecoxib.  MEDICATIONS:  Current Outpatient Medications  Medication Sig Dispense Refill  .  albuterol (PROVENTIL) (2.5 MG/3ML) 0.083% nebulizer solution Take 3 mLs (2.5 mg total) by nebulization every 6 (six) hours as needed for wheezing or shortness of breath. 75 mL 1  . albuterol (VENTOLIN HFA) 108 (90 Base) MCG/ACT inhaler Inhale 2 puffs into the lungs every 6 (six) hours as needed for wheezing or  shortness of breath. 18 g 3  . ciprofloxacin (CIPRO) 500 MG tablet Take 1 tablet (500 mg total) by mouth 2 (two) times daily. 14 tablet 0  . dexamethasone (DECADRON) 4 MG tablet Take 1 tablet (4 mg total) by mouth daily. Take 1 tablet day after chemo and 1 tablet second day after chemo with food 8 tablet 0  . EPINEPHrine (PRIMATENE MIST IN) Inhale 1 puff into the lungs every 6 (six) hours as needed (wheezing/shortness of breath.).    Marland Kitchen gabapentin (NEURONTIN) 300 MG capsule Take up to 4 tabs a day (2 tabs twice a day) (Patient taking differently: Take 300 mg by mouth in the morning, at noon, and at bedtime. )    . HYDROcodone-acetaminophen (NORCO) 5-325 MG tablet Take 1-2 tablets by mouth every 6 (six) hours as needed for severe pain. Sedation caution 20 tablet 0  . HYDROcodone-acetaminophen (NORCO/VICODIN) 5-325 MG tablet Take 1-2 tablets by mouth every 6 (six) hours as needed for moderate pain or severe pain. 10 tablet 0  . ibuprofen (ADVIL) 600 MG tablet Take 1 tablet (600 mg total) by mouth every 8 (eight) hours as needed (For pain.  Take with food.). 90 tablet 2  . lidocaine-prilocaine (EMLA) cream Apply to affected area once 30 g 3  . omeprazole (PRILOSEC OTC) 20 MG tablet Take 20 mg by mouth daily.    . ondansetron (ZOFRAN) 8 MG tablet Take 1 tablet (8 mg total) by mouth 2 (two) times daily as needed. Start on the third day after chemotherapy. 30 tablet 1  . oxyCODONE (OXY IR/ROXICODONE) 5 MG immediate release tablet Take 1-2 tablets (5-10 mg total) by mouth every 6 (six) hours as needed for moderate pain, severe pain or breakthrough pain. 15 tablet 0  . prochlorperazine (COMPAZINE) 10 MG tablet Take 1 tablet (10 mg total) by mouth every 6 (six) hours as needed (Nausea or vomiting). 30 tablet 1  . traMADol (ULTRAM) 50 MG tablet Take 1 tablet (50 mg total) by mouth every 6 (six) hours as needed. 60 tablet 0   No current facility-administered medications for this visit.    PHYSICAL  EXAMINATION: ECOG PERFORMANCE STATUS: 1 - Symptomatic but completely ambulatory  Vitals:   12/27/19 1155  BP: 109/66  Pulse: 92  Resp: 18  Temp: (!) 97.3 F (36.3 C)  SpO2: 99%   Filed Weights   12/27/19 1155  Weight: 154 lb 9.6 oz (70.1 kg)    LABORATORY DATA:  I have reviewed the data as listed CMP Latest Ref Rng & Units 12/13/2019 11/29/2019 11/23/2019  Glucose 70 - 99 mg/dL 238(H) 127(H) 259(H)  BUN 6 - 20 mg/dL 5(L) 10 9  Creatinine 0.44 - 1.00 mg/dL 0.71 0.68 0.75  Sodium 135 - 145 mmol/L 133(L) 137 135  Potassium 3.5 - 5.1 mmol/L 4.1 4.1 3.8  Chloride 98 - 111 mmol/L 99 101 106  CO2 22 - 32 mmol/L 18(L) 14(L) 18(L)  Calcium 8.9 - 10.3 mg/dL 9.8 10.3 9.9  Total Protein 6.5 - 8.1 g/dL 8.1 7.9 6.9  Total Bilirubin 0.3 - 1.2 mg/dL 0.3 0.3 0.2(L)  Alkaline Phos 38 - 126 U/L 195(H) 169(H) 177(H)  AST 15 -  41 U/L 33 36 13(L)  ALT 0 - 44 U/L 67(H) 70(H) 27    Lab Results  Component Value Date   WBC 22.3 (H) 12/27/2019   HGB 13.6 12/27/2019   HCT 37.4 12/27/2019   MCV 94.0 12/27/2019   PLT 154 12/27/2019   NEUTROABS PENDING 12/27/2019    ASSESSMENT & PLAN:  Malignant neoplasm of upper-outer quadrant of left breast in female, estrogen receptor positive ([place]) 10/07/2019:Left lumpectomy and sentinel lymph node biopsy [name]): IDC with DCIS, 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 T2N0 stage Ib Oncotype DX score 32: 20% risk of distant recurrence in 9 years  Treatment plan:  1. Adjuvant chemotherapy with dose dense Adriamycin and Cytoxan x4 followed by Taxol weekly x12 2. followed by radiation therapy  3. Followed by adjuvant antiestrogen therapy. ------------------------------------------------------------------------------------------------------------------------------------------------- Echocardiogram August 2021: EF 55 to  60% Current treatment:Cycle4dose dense Adriamycin and Cytoxan Chemo toxicities: 1.Mild nausea:Responded to antinausea medication 2.hair loss: Secondary to chemo  Return to clinicevery 2 weeks for cycle 1 Taxol    No orders of the defined types were placed in this encounter.  The patient has a good understanding of the overall plan. she agrees with it. she will call with any problems that may develop before the next visit here.  Total time spent: 30 mins including face to face time and time spent for planning, charting and coordination of care  Nicholas Lose, MD 12/27/2019  I, Cloyde Reams Dorshimer, am acting as scribe for Dr. Nicholas Lose.  I have reviewed the above documentation for accuracy and completeness, and I agree with the above.

## 2019-12-27 ENCOUNTER — Inpatient Hospital Stay: Payer: Medicaid Other

## 2019-12-27 ENCOUNTER — Inpatient Hospital Stay (HOSPITAL_BASED_OUTPATIENT_CLINIC_OR_DEPARTMENT_OTHER): Payer: Medicaid Other | Admitting: Hematology and Oncology

## 2019-12-27 ENCOUNTER — Other Ambulatory Visit: Payer: Self-pay

## 2019-12-27 DIAGNOSIS — Z95828 Presence of other vascular implants and grafts: Secondary | ICD-10-CM

## 2019-12-27 DIAGNOSIS — Z170 Estrogen receptor positive status [ER+]: Secondary | ICD-10-CM

## 2019-12-27 DIAGNOSIS — C50412 Malignant neoplasm of upper-outer quadrant of left female breast: Secondary | ICD-10-CM | POA: Diagnosis not present

## 2019-12-27 DIAGNOSIS — Z5111 Encounter for antineoplastic chemotherapy: Secondary | ICD-10-CM | POA: Diagnosis not present

## 2019-12-27 LAB — CBC WITH DIFFERENTIAL (CANCER CENTER ONLY)
Abs Immature Granulocytes: 2.42 10*3/uL — ABNORMAL HIGH (ref 0.00–0.07)
Basophils Absolute: 0 10*3/uL (ref 0.0–0.1)
Basophils Relative: 0 %
Eosinophils Absolute: 0.1 10*3/uL (ref 0.0–0.5)
Eosinophils Relative: 0 %
HCT: 37.4 % (ref 36.0–46.0)
Hemoglobin: 13.6 g/dL (ref 12.0–15.0)
Immature Granulocytes: 11 %
Lymphocytes Relative: 10 %
Lymphs Abs: 2.1 10*3/uL (ref 0.7–4.0)
MCH: 34.2 pg — ABNORMAL HIGH (ref 26.0–34.0)
MCHC: 36.4 g/dL — ABNORMAL HIGH (ref 30.0–36.0)
MCV: 94 fL (ref 80.0–100.0)
Monocytes Absolute: 1 10*3/uL (ref 0.1–1.0)
Monocytes Relative: 4 %
Neutro Abs: 16.7 10*3/uL — ABNORMAL HIGH (ref 1.7–7.7)
Neutrophils Relative %: 75 %
Platelet Count: 154 10*3/uL (ref 150–400)
RBC: 3.98 MIL/uL (ref 3.87–5.11)
RDW: 14.9 % (ref 11.5–15.5)
WBC Count: 22.3 10*3/uL — ABNORMAL HIGH (ref 4.0–10.5)
nRBC: 0.1 % (ref 0.0–0.2)

## 2019-12-27 LAB — CMP (CANCER CENTER ONLY)
ALT: 100 U/L — ABNORMAL HIGH (ref 10–47)
AST: 63 U/L — ABNORMAL HIGH (ref 11–38)
Albumin: 4 g/dL (ref 3.5–5.0)
Alkaline Phosphatase: 178 U/L — ABNORMAL HIGH (ref 38–126)
Anion gap: 15 (ref 5–15)
BUN: 11 mg/dL (ref 6–20)
CO2: 20 mmol/L — ABNORMAL LOW (ref 22–32)
Calcium: 9.2 mg/dL (ref 8.9–10.3)
Chloride: 101 mmol/L (ref 98–111)
Creatinine: 0.69 mg/dL (ref 0.60–1.20)
Glucose, Bld: 243 mg/dL — ABNORMAL HIGH (ref 70–99)
Potassium: 4.4 mmol/L (ref 3.5–5.1)
Sodium: 136 mmol/L (ref 135–145)
Total Bilirubin: 1 mg/dL (ref 0.2–1.6)
Total Protein: 6.8 g/dL (ref 6.5–8.1)

## 2019-12-27 MED ORDER — DOXORUBICIN HCL CHEMO IV INJECTION 2 MG/ML
50.0000 mg/m2 | Freq: Once | INTRAVENOUS | Status: AC
  Administered 2019-12-27: 90 mg via INTRAVENOUS
  Filled 2019-12-27: qty 45

## 2019-12-27 MED ORDER — HEPARIN SOD (PORK) LOCK FLUSH 100 UNIT/ML IV SOLN
500.0000 [IU] | Freq: Once | INTRAVENOUS | Status: AC | PRN
  Administered 2019-12-27: 500 [IU]
  Filled 2019-12-27: qty 5

## 2019-12-27 MED ORDER — SODIUM CHLORIDE 0.9 % IV SOLN
150.0000 mg | Freq: Once | INTRAVENOUS | Status: AC
  Administered 2019-12-27: 150 mg via INTRAVENOUS
  Filled 2019-12-27: qty 150

## 2019-12-27 MED ORDER — PALONOSETRON HCL INJECTION 0.25 MG/5ML
0.2500 mg | Freq: Once | INTRAVENOUS | Status: AC
  Administered 2019-12-27: 0.25 mg via INTRAVENOUS

## 2019-12-27 MED ORDER — SODIUM CHLORIDE 0.9% FLUSH
10.0000 mL | INTRAVENOUS | Status: DC | PRN
  Administered 2019-12-27: 10 mL
  Filled 2019-12-27: qty 10

## 2019-12-27 MED ORDER — PALONOSETRON HCL INJECTION 0.25 MG/5ML
INTRAVENOUS | Status: AC
  Filled 2019-12-27: qty 5

## 2019-12-27 MED ORDER — SODIUM CHLORIDE 0.9% FLUSH
10.0000 mL | Freq: Once | INTRAVENOUS | Status: AC
  Administered 2019-12-27: 10 mL
  Filled 2019-12-27: qty 10

## 2019-12-27 MED ORDER — SODIUM CHLORIDE 0.9 % IV SOLN
500.0000 mg/m2 | Freq: Once | INTRAVENOUS | Status: AC
  Administered 2019-12-27: 900 mg via INTRAVENOUS
  Filled 2019-12-27: qty 45

## 2019-12-27 MED ORDER — HEPARIN SOD (PORK) LOCK FLUSH 100 UNIT/ML IV SOLN
250.0000 [IU] | Freq: Once | INTRAVENOUS | Status: DC
  Filled 2019-12-27: qty 5

## 2019-12-27 MED ORDER — SODIUM CHLORIDE 0.9 % IV SOLN
10.0000 mg | Freq: Once | INTRAVENOUS | Status: AC
  Administered 2019-12-27: 10 mg via INTRAVENOUS
  Filled 2019-12-27: qty 10

## 2019-12-27 MED ORDER — SODIUM CHLORIDE 0.9 % IV SOLN
Freq: Once | INTRAVENOUS | Status: AC
  Filled 2019-12-27: qty 250

## 2019-12-27 NOTE — Assessment & Plan Note (Signed)
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 T2N0 stage Ib Oncotype DX score 32: 20% risk of distant recurrence in 9 years  Treatment plan:  1. Adjuvant chemotherapy with dose dense Adriamycin and Cytoxan x4 followed by Taxol weekly x12 2. followed by radiation therapy  3. Followed by adjuvant antiestrogen therapy. ------------------------------------------------------------------------------------------------------------------------------------------------- Echocardiogram August 2021: EF 55 to 60% Current treatment:Cycle4dose dense Adriamycin and Cytoxan Chemo toxicities: 1.Mild nausea:Responded to antinausea medication 2.hair loss: Secondary to chemo  Return to clinicevery 2 weeks for cycle 1 Taxol

## 2019-12-27 NOTE — Patient Instructions (Signed)

## 2019-12-27 NOTE — Progress Notes (Signed)
Per lab, CMET was "lypemic" and was sent to [facility] to be read. Called and spoke to [facility] lab to see about results. Per [facility], their analyzer is down and they will be sending the sample to [place].  Dr. Lindi Adie made aware. Per Dr. Lindi Adie, ok to treat today with pending CMET.

## 2019-12-27 NOTE — Patient Instructions (Signed)
[facility] Discharge Instructions for Patients Receiving Chemotherapy  Today you received the following chemotherapy agents Adriamycin; Cytoxan  To help prevent nausea and vomiting after your treatment, we encourage you to take your nausea medication as directed   If you develop nausea and vomiting that is not controlled by your nausea medication, call the clinic.   BELOW ARE SYMPTOMS THAT SHOULD BE REPORTED IMMEDIATELY:  *FEVER GREATER THAN 100.5 F  *CHILLS WITH OR WITHOUT FEVER  NAUSEA AND VOMITING THAT IS NOT CONTROLLED WITH YOUR NAUSEA MEDICATION  *UNUSUAL SHORTNESS OF BREATH  *UNUSUAL BRUISING OR BLEEDING  TENDERNESS IN MOUTH AND THROAT WITH OR WITHOUT PRESENCE OF ULCERS  *URINARY PROBLEMS  *BOWEL PROBLEMS  UNUSUAL RASH Items with * indicate a potential emergency and should be followed up as soon as possible.  Feel free to call the clinic should you have any questions or concerns. The clinic phone number is [phone].  Please show the CHEMO ALERT CARD at check-in to the Emergency Department and triage nurse.   

## 2019-12-29 ENCOUNTER — Encounter: Payer: Self-pay | Admitting: *Deleted

## 2019-12-29 ENCOUNTER — Other Ambulatory Visit: Payer: Self-pay

## 2019-12-29 ENCOUNTER — Inpatient Hospital Stay: Payer: Medicaid Other

## 2019-12-29 VITALS — BP 107/64 | HR 75 | Temp 98.2°F | Resp 18

## 2019-12-29 DIAGNOSIS — Z5111 Encounter for antineoplastic chemotherapy: Secondary | ICD-10-CM | POA: Diagnosis not present

## 2019-12-29 DIAGNOSIS — C50412 Malignant neoplasm of upper-outer quadrant of left female breast: Secondary | ICD-10-CM

## 2019-12-29 DIAGNOSIS — Z170 Estrogen receptor positive status [ER+]: Secondary | ICD-10-CM

## 2019-12-29 MED ORDER — PEGFILGRASTIM-JMDB 6 MG/0.6ML ~~LOC~~ SOSY
6.0000 mg | PREFILLED_SYRINGE | Freq: Once | SUBCUTANEOUS | Status: AC
  Administered 2019-12-29: 6 mg via SUBCUTANEOUS

## 2019-12-29 MED ORDER — PEGFILGRASTIM-JMDB 6 MG/0.6ML ~~LOC~~ SOSY
PREFILLED_SYRINGE | SUBCUTANEOUS | Status: AC
  Filled 2019-12-29: qty 0.6

## 2019-12-29 NOTE — Patient Instructions (Signed)

## 2019-12-30 ENCOUNTER — Other Ambulatory Visit: Payer: Self-pay | Admitting: Hematology and Oncology

## 2019-12-30 DIAGNOSIS — Z170 Estrogen receptor positive status [ER+]: Secondary | ICD-10-CM

## 2020-01-02 ENCOUNTER — Telehealth: Payer: Self-pay | Admitting: Hematology and Oncology

## 2020-01-02 NOTE — Telephone Encounter (Signed)
Scheduled per 9/21 los. Called and left a msg for pt, mailing appt letter and calendar printout

## 2020-01-03 ENCOUNTER — Encounter: Payer: Self-pay | Admitting: *Deleted

## 2020-01-03 ENCOUNTER — Telehealth: Payer: Self-pay | Admitting: Family Medicine

## 2020-01-03 MED ORDER — HYDROCODONE-ACETAMINOPHEN 5-325 MG PO TABS: 1.0000 | ORAL_TABLET | Freq: Four times a day (QID) | ORAL | 0 refills | Status: DC | PRN

## 2020-01-03 NOTE — Telephone Encounter (Signed)
rx sent.  She needs f/u visit scheduled here.

## 2020-01-03 NOTE — Telephone Encounter (Signed)
Pt needs refill on hydrocodone-acetaminophen 5-325mg   [facility] [address] [place]  Pt is completely out of medication Thank you!

## 2020-01-03 NOTE — Telephone Encounter (Signed)
Patient advised.

## 2020-01-06 MED FILL — Dexamethasone Sodium Phosphate Inj 100 MG/10ML: INTRAMUSCULAR | Qty: 1 | Status: AC

## 2020-01-08 NOTE — Progress Notes (Signed)
Patient Care Team: Tonia Ghent, MD as PCP - General (Family Medicine) [name], RN as Registered Nurse [name], RN as Registered Nurse [name], RN as Oncology Nurse Navigator [name], RN as Oncology Nurse Navigator  DIAGNOSIS:    ICD-10-CM   1. Malignant neoplasm of upper-outer quadrant of left breast in female, estrogen receptor positive ([place])  C50.412    Z17.0     SUMMARY OF ONCOLOGIC HISTORY: Oncology History  Malignant neoplasm of upper-outer quadrant of left breast in female, estrogen receptor positive ([place])  08/25/2019 Initial Diagnosis   Palpable left breast mass x1 month. Diagnostic mammogram and [place] on 08/18/19 showed a 2.8cm mass at the 2 o'clock position, and one lymph node with mild cortical thickening in the left axilla. 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   10/18/2019 Cancer Staging   Staging form: Breast, AJCC 8th Edition - Pathologic stage from 10/18/2019: Stage IB (pT2, pN0(sn), cM0, G3, ER+, PR+, HER2-) - Signed by Nicholas Lose, MD on 10/18/2019   10/21/2019 Oncotype testing   32/20%   11/15/2019 -  Chemotherapy   The patient had dexamethasone (DECADRON) 4 MG tablet, 4 mg (100 % of original dose 4 mg), , , 1 of 1 cycle, Start date: 12/30/2019, End date: -- Dose modification: 4 mg (original dose 4 mg, Cycle 0) DOXOrubicin (ADRIAMYCIN) chemo injection 90 mg, 50 mg/m2 = 90 mg (83.3 % of original dose 60 mg/m2), Intravenous,  Once, 4 of 4 cycles Dose modification: 50 mg/m2 (original dose 60 mg/m2, Cycle 1, Reason: Provider Judgment) Administration: 90 mg (11/15/2019), 90 mg (11/29/2019), 90 mg (12/13/2019), 90 mg (12/27/2019) palonosetron (ALOXI) injection 0.25  mg, 0.25 mg, Intravenous,  Once, 4 of 4 cycles Administration: 0.25 mg (11/15/2019), 0.25 mg (11/29/2019), 0.25 mg (12/13/2019), 0.25 mg (12/27/2019) pegfilgrastim-jmdb (FULPHILA) injection 6 mg, 6 mg, Subcutaneous,  Once, 4 of 4 cycles Administration: 6 mg (11/17/2019), 6 mg (12/01/2019), 6 mg (12/15/2019), 6 mg (12/29/2019) cyclophosphamide (CYTOXAN) 900 mg in sodium chloride 0.9 % 250 mL chemo infusion, 500 mg/m2 = 900 mg (83.3 % of original dose 600 mg/m2), Intravenous,  Once, 4 of 4 cycles Dose modification: 500 mg/m2 (original dose 600 mg/m2, Cycle 1, Reason: Provider Judgment) Administration: 900 mg (11/15/2019), 900 mg (11/29/2019), 900 mg (12/13/2019), 900 mg (12/27/2019) PACLitaxel (TAXOL) 144 mg in sodium chloride 0.9 % 250 mL chemo infusion (</= 15m/m2), 80 mg/m2 = 144 mg, Intravenous,  Once, 0 of 12 cycles fosaprepitant (EMEND) 150 mg in sodium chloride 0.9 % 145 mL IVPB, 150 mg, Intravenous,  Once, 4 of 4 cycles Administration: 150 mg (11/15/2019), 150 mg (11/29/2019), 150 mg (12/13/2019), 150 mg (12/27/2019)  for chemotherapy treatment.      CHIEF COMPLIANT: Cycle 1 Taxol  INTERVAL HISTORY: Julia LANTERis a 54y.o. with above-mentioned history of left breast cancer treated with lumpectomy andwho is currently on adjuvantchemotherapy with weekly Taxol after completing 4 cycles of dose dense Adriamycin and Cytoxan. She presents to the clinic today for a toxicity checkandcycle1.  She has noticed nausea after received a Neulasta injection and the fatigue that lasted 3 to 4 days.  She is also very concerned about her rising blood sugars.  She is not a diabetic.  We also noticed increase in liver function tests as well.  Today she feels fine.  She is waiting to start Taxol.  ALLERGIES:  is allergic to propoxyphene n-acetaminophen, nabumetone, and rofecoxib.  MEDICATIONS:  Current Outpatient Medications  Medication Sig Dispense Refill  . albuterol (PROVENTIL) (2.5 MG/3ML) 0.083% nebulizer  solution Take 3 mLs (2.5 mg total) by nebulization every 6 (six) hours as needed for wheezing or shortness of breath. 75 mL 1  . albuterol (VENTOLIN HFA) 108 (90 Base) MCG/ACT inhaler Inhale 2 puffs into the lungs every 6 (six) hours as needed for wheezing or shortness of breath. 18 g 3  . ciprofloxacin (CIPRO) 500 MG tablet Take 1 tablet (500 mg total) by mouth 2 (two) times daily. 14 tablet 0  . dexamethasone (DECADRON) 4 MG tablet TAKE 1 TABLET BY MOUTH THE DAY AFTER CHEMO AND 1 TABLET SECOND DAY AFTER CHEMO WITH FOOD 8 tablet 0  . EPINEPHrine (PRIMATENE MIST IN) Inhale 1 puff into the lungs every 6 (six) hours as needed (wheezing/shortness of breath.).    Marland Kitchen gabapentin (NEURONTIN) 300 MG capsule Take up to 4 tabs a day (2 tabs twice a day) (Patient taking differently: Take 300 mg by mouth in the morning, at noon, and at bedtime. )    . HYDROcodone-acetaminophen (NORCO) 5-325 MG tablet Take 1-2 tablets by mouth every 6 (six) hours as needed for severe pain. Sedation caution 20 tablet 0  . ibuprofen (ADVIL) 600 MG tablet Take 1 tablet (600 mg total) by mouth every 8 (eight) hours as needed (For pain.  Take with food.). 90 tablet 2  . lidocaine-prilocaine (EMLA) cream Apply to affected area once 30 g 3  . omeprazole (PRILOSEC OTC) 20 MG tablet Take 20 mg by mouth daily.    . ondansetron (ZOFRAN) 8 MG tablet DISSOLVE 1TAB ON THE TONGUE UP TO TWICE A DAY AS NEEDED**START ON THE THIRD DAY AFTER CHEMOTHERAPY 30 tablet 1  . oxyCODONE (OXY IR/ROXICODONE) 5 MG immediate release tablet Take 1-2 tablets (5-10 mg total) by mouth every 6 (six) hours as needed for moderate pain, severe pain or breakthrough pain. 15 tablet 0  . prochlorperazine (COMPAZINE) 10 MG tablet Take 1 tablet (10 mg total) by mouth every 6 (six) hours as needed (Nausea or vomiting). 30 tablet 1  . traMADol (ULTRAM) 50 MG tablet Take 1 tablet (50 mg total) by mouth every 6 (six) hours as needed. 60 tablet 0   No current facility-administered  medications for this visit.    PHYSICAL EXAMINATION: ECOG PERFORMANCE STATUS: 1 - Symptomatic but completely ambulatory  Vitals:   01/09/20 0801  BP: 118/63  Pulse: (!) 18  Resp: (!) 98  Temp: 97.6 F (36.4 C)  SpO2: 95%   Filed Weights   01/09/20 0801  Weight: 153 lb 3.2 oz (69.5 kg)    LABORATORY DATA:  I have reviewed the data as listed CMP Latest Ref Rng & Units 12/27/2019 12/13/2019 11/29/2019  Glucose 70 - 99 mg/dL 243(H) 238(H) 127(H)  BUN 6 - 20 mg/dL 11 5(L) 10  Creatinine 0.60 - 1.20 mg/dL 0.69 0.71 0.68  Sodium 135 - 145 mmol/L 136 133(L) 137  Potassium 3.5 - 5.1 mmol/L 4.4 4.1 4.1  Chloride 98 - 111 mmol/L 101 99 101  CO2 22 - 32 mmol/L 20(L) 18(L) 14(L)  Calcium 8.9 - 10.3 mg/dL 9.2 9.8 10.3  Total Protein 6.5 - 8.1 g/dL 6.8 8.1 7.9  Total Bilirubin 0.2 - 1.6  mg/dL 1.0 0.3 0.3  Alkaline Phos 38 - 126 U/L 178(H) 195(H) 169(H)  AST 11 - 38 U/L 63(H) 33 36  ALT 10 - 47 U/L 100(H) 67(H) 70(H)    Lab Results  Component Value Date   WBC 18.1 (H) 01/09/2020   HGB 13.3 01/09/2020   HCT 37.8 01/09/2020   MCV 95.0 01/09/2020   PLT 121 (L) 01/09/2020   NEUTROABS PENDING 01/09/2020    ASSESSMENT & PLAN:  Malignant neoplasm of upper-outer quadrant of left breast in female, estrogen receptor positive ([place]) 10/07/2019:Left lumpectomy and sentinel lymph node biopsy [name]): IDC with DCIS, 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 T2N0 stage Ib Oncotype DX score 32: 20% risk of distant recurrence in 9 years  Treatment plan:  1. Adjuvant chemotherapy with dose dense Adriamycin and Cytoxan x4 followed by Taxol weekly x12 2. followed by radiation therapy  3. Followed by adjuvant antiestrogen  therapy. ------------------------------------------------------------------------------------------------------------------------------------------------- Echocardiogram August 2021: EF 55 to 60% Current treatment:Completed 4 cycles ofdose dense Adriamycin and Cytoxan, today is cycle 1 Taxol Chemo toxicities: 1. Elevated blood sugars: Blood sugars were in the 200s.  I am worried about treatment induced diabetes.  We will remove all steroids.  She will not take any further dexamethasone. 2.hair loss:Secondary to chemo 3.  Elevated LFTs: I discussed with her that we will need to monitor this closely.  It is okay to treat as long as the AST and ALT are below 150.  Instructed her to walk and watch her diet very closely. 4.  Mild thrombocytopenia: Monitoring   Return to clinicin 1 week for cycle 2 of Taxol    No orders of the defined types were placed in this encounter.  The patient has a good understanding of the overall plan. she agrees with it. she will call with any problems that may develop before the next visit here.  Total time spent: 30 mins including face to face time and time spent for planning, charting and coordination of care  Nicholas Lose, MD 01/09/2020  I, Cloyde Reams Dorshimer, am acting as scribe for Dr. Nicholas Lose.  I have reviewed the above documentation for accuracy and completeness, and I agree with the above.

## 2020-01-09 ENCOUNTER — Inpatient Hospital Stay: Payer: Medicaid Other

## 2020-01-09 ENCOUNTER — Inpatient Hospital Stay: Payer: Medicaid Other | Attending: Hematology and Oncology

## 2020-01-09 ENCOUNTER — Other Ambulatory Visit: Payer: Self-pay | Admitting: Hematology and Oncology

## 2020-01-09 ENCOUNTER — Inpatient Hospital Stay (HOSPITAL_BASED_OUTPATIENT_CLINIC_OR_DEPARTMENT_OTHER): Payer: Medicaid Other | Admitting: Hematology and Oncology

## 2020-01-09 ENCOUNTER — Other Ambulatory Visit: Payer: Self-pay

## 2020-01-09 VITALS — BP 126/65 | HR 76 | Temp 98.4°F | Resp 16

## 2020-01-09 DIAGNOSIS — L658 Other specified nonscarring hair loss: Secondary | ICD-10-CM | POA: Insufficient documentation

## 2020-01-09 DIAGNOSIS — Z79899 Other long term (current) drug therapy: Secondary | ICD-10-CM | POA: Diagnosis not present

## 2020-01-09 DIAGNOSIS — C50412 Malignant neoplasm of upper-outer quadrant of left female breast: Secondary | ICD-10-CM

## 2020-01-09 DIAGNOSIS — Z170 Estrogen receptor positive status [ER+]: Secondary | ICD-10-CM | POA: Diagnosis not present

## 2020-01-09 DIAGNOSIS — T451X5A Adverse effect of antineoplastic and immunosuppressive drugs, initial encounter: Secondary | ICD-10-CM

## 2020-01-09 DIAGNOSIS — E099 Drug or chemical induced diabetes mellitus without complications: Secondary | ICD-10-CM

## 2020-01-09 DIAGNOSIS — R110 Nausea: Secondary | ICD-10-CM | POA: Diagnosis not present

## 2020-01-09 DIAGNOSIS — R7989 Other specified abnormal findings of blood chemistry: Secondary | ICD-10-CM | POA: Diagnosis not present

## 2020-01-09 DIAGNOSIS — Z5111 Encounter for antineoplastic chemotherapy: Secondary | ICD-10-CM | POA: Insufficient documentation

## 2020-01-09 DIAGNOSIS — D696 Thrombocytopenia, unspecified: Secondary | ICD-10-CM | POA: Insufficient documentation

## 2020-01-09 DIAGNOSIS — R739 Hyperglycemia, unspecified: Secondary | ICD-10-CM | POA: Insufficient documentation

## 2020-01-09 DIAGNOSIS — Z95828 Presence of other vascular implants and grafts: Secondary | ICD-10-CM

## 2020-01-09 DIAGNOSIS — R630 Anorexia: Secondary | ICD-10-CM | POA: Diagnosis not present

## 2020-01-09 DIAGNOSIS — R5383 Other fatigue: Secondary | ICD-10-CM | POA: Diagnosis not present

## 2020-01-09 LAB — CMP (CANCER CENTER ONLY)
ALT: 72 U/L — ABNORMAL HIGH (ref 0–44)
AST: 35 U/L (ref 15–41)
Albumin: 3.7 g/dL (ref 3.5–5.0)
Alkaline Phosphatase: 207 U/L — ABNORMAL HIGH (ref 38–126)
Anion gap: 20 — ABNORMAL HIGH (ref 5–15)
BUN: 8 mg/dL (ref 6–20)
CO2: 13 mmol/L — ABNORMAL LOW (ref 22–32)
Calcium: 9.4 mg/dL (ref 8.9–10.3)
Chloride: 99 mmol/L (ref 98–111)
Creatinine: 0.76 mg/dL (ref 0.44–1.00)
GFR, Est AFR Am: >60 mL/min (ref >60)
GFR, Estimated: >60 mL/min (ref >60)
Glucose, Bld: 353 mg/dL — ABNORMAL HIGH (ref 70–99)
Potassium: 4.1 mmol/L (ref 3.5–5.1)
Sodium: 132 mmol/L — ABNORMAL LOW (ref 135–145)
Total Bilirubin: 0.3 mg/dL (ref 0.3–1.2)
Total Protein: 7.4 g/dL (ref 6.5–8.1)

## 2020-01-09 LAB — CBC WITH DIFFERENTIAL (CANCER CENTER ONLY)
Abs Immature Granulocytes: 1.3 10*3/uL — ABNORMAL HIGH (ref 0.00–0.07)
Band Neutrophils: 4 %
Basophils Absolute: 0.2 10*3/uL — ABNORMAL HIGH (ref 0.0–0.1)
Basophils Relative: 1 %
Eosinophils Absolute: 0 10*3/uL (ref 0.0–0.5)
Eosinophils Relative: 0 %
HCT: 37.8 % (ref 36.0–46.0)
Hemoglobin: 13.3 g/dL (ref 12.0–15.0)
Lymphocytes Relative: 4 %
Lymphs Abs: 0.7 10*3/uL (ref 0.7–4.0)
MCH: 33.4 pg (ref 26.0–34.0)
MCHC: 35.2 g/dL (ref 30.0–36.0)
MCV: 95 fL (ref 80.0–100.0)
Metamyelocytes Relative: 6 %
Monocytes Absolute: 1.3 10*3/uL — ABNORMAL HIGH (ref 0.1–1.0)
Monocytes Relative: 7 %
Myelocytes: 1 %
Neutro Abs: 14.7 10*3/uL — ABNORMAL HIGH (ref 1.7–7.7)
Neutrophils Relative %: 77 %
Platelet Count: 121 10*3/uL — ABNORMAL LOW (ref 150–400)
RBC: 3.98 MIL/uL (ref 3.87–5.11)
RDW: 15.8 % — ABNORMAL HIGH (ref 11.5–15.5)
WBC Count: 18.1 10*3/uL — ABNORMAL HIGH (ref 4.0–10.5)
nRBC: 0.2 % (ref 0.0–0.2)

## 2020-01-09 LAB — HEMOGLOBIN A1C
Hgb A1c MFr Bld: 8.7 % — ABNORMAL HIGH (ref 4.8–5.6)
Mean Plasma Glucose: 202.99 mg/dL

## 2020-01-09 MED ORDER — INSULIN ASPART 100 UNIT/ML ~~LOC~~ SOLN
4.0000 [IU] | Freq: Once | SUBCUTANEOUS | Status: AC
  Administered 2020-01-09: 4 [IU] via SUBCUTANEOUS

## 2020-01-09 MED ORDER — FAMOTIDINE IN NACL 20-0.9 MG/50ML-% IV SOLN
20.0000 mg | Freq: Once | INTRAVENOUS | Status: AC
  Administered 2020-01-09: 20 mg via INTRAVENOUS

## 2020-01-09 MED ORDER — SODIUM CHLORIDE 0.9 % IV SOLN
Freq: Once | INTRAVENOUS | Status: AC
  Filled 2020-01-09: qty 250

## 2020-01-09 MED ORDER — DIPHENHYDRAMINE HCL 50 MG/ML IJ SOLN
25.0000 mg | Freq: Once | INTRAMUSCULAR | Status: AC
  Administered 2020-01-09: 25 mg via INTRAVENOUS

## 2020-01-09 MED ORDER — FAMOTIDINE IN NACL 20-0.9 MG/50ML-% IV SOLN
INTRAVENOUS | Status: AC
  Filled 2020-01-09: qty 50

## 2020-01-09 MED ORDER — METFORMIN HCL 500 MG PO TABS: 500.0000 mg | ORAL_TABLET | Freq: Two times a day (BID) | ORAL | 3 refills | Status: DC

## 2020-01-09 MED ORDER — SODIUM CHLORIDE 0.9% FLUSH
10.0000 mL | INTRAVENOUS | Status: DC | PRN
  Administered 2020-01-09: 10 mL
  Filled 2020-01-09: qty 10

## 2020-01-09 MED ORDER — SODIUM CHLORIDE 0.9 % IV SOLN
80.0000 mg/m2 | Freq: Once | INTRAVENOUS | Status: AC
  Administered 2020-01-09: 144 mg via INTRAVENOUS
  Filled 2020-01-09: qty 24

## 2020-01-09 MED ORDER — DIPHENHYDRAMINE HCL 50 MG/ML IJ SOLN
INTRAMUSCULAR | Status: AC
  Filled 2020-01-09: qty 1

## 2020-01-09 MED ORDER — HEPARIN SOD (PORK) LOCK FLUSH 100 UNIT/ML IV SOLN
500.0000 [IU] | Freq: Once | INTRAVENOUS | Status: AC | PRN
  Administered 2020-01-09: 500 [IU]
  Filled 2020-01-09: qty 5

## 2020-01-09 MED ORDER — SODIUM CHLORIDE 0.9% FLUSH
10.0000 mL | Freq: Once | INTRAVENOUS | Status: AC
  Administered 2020-01-09: 10 mL
  Filled 2020-01-09: qty 10

## 2020-01-09 MED ORDER — INSULIN ASPART 100 UNIT/ML ~~LOC~~ SOLN
SUBCUTANEOUS | Status: AC
  Filled 2020-01-09: qty 1

## 2020-01-09 NOTE — Progress Notes (Signed)
Patient's BG 353 on CMP. Per Dr Lindi Adie, pt to receive 4 units of insulin during infusion and start on metformin at home. Infusion RN also to draw A1C level. Informed patient of plan; she verbalized understanding.

## 2020-01-09 NOTE — Patient Instructions (Signed)
[facility] Discharge Instructions for Patients Receiving Chemotherapy  Today you received the following chemotherapy agents: paclitaxel.  To help prevent nausea and vomiting after your treatment, we encourage you to take your nausea medication as directed.   If you develop nausea and vomiting that is not controlled by your nausea medication, call the clinic.   BELOW ARE SYMPTOMS THAT SHOULD BE REPORTED IMMEDIATELY:  *FEVER GREATER THAN 100.5 F  *CHILLS WITH OR WITHOUT FEVER  NAUSEA AND VOMITING THAT IS NOT CONTROLLED WITH YOUR NAUSEA MEDICATION  *UNUSUAL SHORTNESS OF BREATH  *UNUSUAL BRUISING OR BLEEDING  TENDERNESS IN MOUTH AND THROAT WITH OR WITHOUT PRESENCE OF ULCERS  *URINARY PROBLEMS  *BOWEL PROBLEMS  UNUSUAL RASH Items with * indicate a potential emergency and should be followed up as soon as possible.  Feel free to call the clinic should you have any questions or concerns. The clinic phone number is [phone].  Please show the CHEMO ALERT CARD at check-in to the Emergency Department and triage nurse.  Paclitaxel injection What is this medicine? PACLITAXEL (PAK li TAX el) is a chemotherapy drug. It targets fast dividing cells, like cancer cells, and causes these cells to die. This medicine is used to treat ovarian cancer, breast cancer, lung cancer, Kaposi's sarcoma, and other cancers. This medicine may be used for other purposes; ask your health care provider or pharmacist if you have questions. COMMON BRAND NAME(S): Onxol, Taxol What should I tell my health care provider before I take this medicine? They need to know if you have any of these conditions:  history of irregular heartbeat  liver disease  low blood counts, like low white cell, platelet, or red cell counts  lung or breathing disease, like asthma  tingling of the fingers or toes, or other nerve disorder  an unusual or allergic reaction to paclitaxel, alcohol, polyoxyethylated  castor oil, other chemotherapy, other medicines, foods, dyes, or preservatives  pregnant or trying to get pregnant  breast-feeding How should I use this medicine? This drug is given as an infusion into a vein. It is administered in a hospital or clinic by a specially trained health care professional. Talk to your pediatrician regarding the use of this medicine in children. Special care may be needed. Overdosage: If you think you have taken too much of this medicine contact a poison control center or emergency room at once. NOTE: This medicine is only for you. Do not share this medicine with others. What if I miss a dose? It is important not to miss your dose. Call your doctor or health care professional if you are unable to keep an appointment. What may interact with this medicine? Do not take this medicine with any of the following medications:  disulfiram  metronidazole This medicine may also interact with the following medications:  antiviral medicines for hepatitis, HIV or AIDS  certain antibiotics like erythromycin and clarithromycin  certain medicines for fungal infections like ketoconazole and itraconazole  certain medicines for seizures like carbamazepine, phenobarbital, phenytoin  gemfibrozil  nefazodone  rifampin  St. John's wort This list may not describe all possible interactions. Give your health care provider a list of all the medicines, herbs, non-prescription drugs, or dietary supplements you use. Also tell them if you smoke, drink alcohol, or use illegal drugs. Some items may interact with your medicine. What should I watch for while using this medicine? Your condition will be monitored carefully while you are receiving this medicine. You will need important blood work   done while you are taking this medicine. This medicine can cause serious allergic reactions. To reduce your risk you will need to take other medicine(s) before treatment with this medicine. If you  experience allergic reactions like skin rash, itching or hives, swelling of the face, lips, or tongue, tell your doctor or health care professional right away. In some cases, you may be given additional medicines to help with side effects. Follow all directions for their use. This drug may make you feel generally unwell. This is not uncommon, as chemotherapy can affect healthy cells as well as cancer cells. Report any side effects. Continue your course of treatment even though you feel ill unless your doctor tells you to stop. Call your doctor or health care professional for advice if you get a fever, chills or sore throat, or other symptoms of a cold or flu. Do not treat yourself. This drug decreases your body's ability to fight infections. Try to avoid being around people who are sick. This medicine may increase your risk to bruise or bleed. Call your doctor or health care professional if you notice any unusual bleeding. Be careful brushing and flossing your teeth or using a toothpick because you may get an infection or bleed more easily. If you have any dental work done, tell your dentist you are receiving this medicine. Avoid taking products that contain aspirin, acetaminophen, ibuprofen, naproxen, or ketoprofen unless instructed by your doctor. These medicines may hide a fever. Do not become pregnant while taking this medicine. Women should inform their doctor if they wish to become pregnant or think they might be pregnant. There is a potential for serious side effects to an unborn child. Talk to your health care professional or pharmacist for more information. Do not breast-feed an infant while taking this medicine. Men are advised not to father a child while receiving this medicine. This product may contain alcohol. Ask your pharmacist or healthcare provider if this medicine contains alcohol. Be sure to tell all healthcare providers you are taking this medicine. Certain medicines, like metronidazole  and disulfiram, can cause an unpleasant reaction when taken with alcohol. The reaction includes flushing, headache, nausea, vomiting, sweating, and increased thirst. The reaction can last from 30 minutes to several hours. What side effects may I notice from receiving this medicine? Side effects that you should report to your doctor or health care professional as soon as possible:  allergic reactions like skin rash, itching or hives, swelling of the face, lips, or tongue  breathing problems  changes in vision  fast, irregular heartbeat  high or low blood pressure  mouth sores  pain, tingling, numbness in the hands or feet  signs of decreased platelets or bleeding - bruising, pinpoint red spots on the skin, black, tarry stools, blood in the urine  signs of decreased red blood cells - unusually weak or tired, feeling faint or lightheaded, falls  signs of infection - fever or chills, cough, sore throat, pain or difficulty passing urine  signs and symptoms of liver injury like dark yellow or brown urine; general ill feeling or flu-like symptoms; light-colored stools; loss of appetite; nausea; right upper belly pain; unusually weak or tired; yellowing of the eyes or skin  swelling of the ankles, feet, hands  unusually slow heartbeat Side effects that usually do not require medical attention (report to your doctor or health care professional if they continue or are bothersome):  diarrhea  hair loss  loss of appetite  muscle or joint pain    nausea, vomiting  pain, redness, or irritation at site where injected  tiredness This list may not describe all possible side effects. Call your doctor for medical advice about side effects. You may report side effects to FDA at 1-800-FDA-1088. Where should I keep my medicine? This drug is given in a hospital or clinic and will not be stored at home. NOTE: This sheet is a summary. It may not cover all possible information. If you have  questions about this medicine, talk to your doctor, pharmacist, or health care provider.  2020 Elsevier/Gold Standard (2016-11-25 13:14:55)    

## 2020-01-09 NOTE — Assessment & Plan Note (Signed)
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 T2N0 stage Ib Oncotype DX score 32: 20% risk of distant recurrence in 9 years  Treatment plan:  1. Adjuvant chemotherapy with dose dense Adriamycin and Cytoxan x4 followed by Taxol weekly x12 2. followed by radiation therapy  3. Followed by adjuvant antiestrogen therapy. ------------------------------------------------------------------------------------------------------------------------------------------------- Echocardiogram August 2021: EF 55 to 60% Current treatment:Completed 4 cycles ofdose dense Adriamycin and Cytoxan, today is cycle 1 Taxol Chemo toxicities: 1.Mild nausea:Responded to antinausea medication 2.hair loss:Secondary to chemo  Return to clinicin 1 week for cycle 2 of Taxol 

## 2020-01-09 NOTE — Progress Notes (Signed)
Blood sugar today is 353 We will give her 4 units of insulin with her infusion We will start her on Metformin 500 mg p.o. twice daily I will obtain hemoglobin A1c as well.

## 2020-01-09 NOTE — Patient Instructions (Signed)

## 2020-01-11 ENCOUNTER — Other Ambulatory Visit: Payer: Medicaid Other

## 2020-01-11 ENCOUNTER — Ambulatory Visit: Payer: Medicaid Other | Admitting: Hematology and Oncology

## 2020-01-11 ENCOUNTER — Telehealth: Payer: Self-pay | Admitting: Hematology and Oncology

## 2020-01-11 NOTE — Telephone Encounter (Signed)
No 10/4 los, no changes made to pt schedule  

## 2020-01-12 ENCOUNTER — Ambulatory Visit: Payer: Medicaid Other

## 2020-01-16 NOTE — Progress Notes (Signed)
Patient Care Team: Tonia Ghent, MD as PCP - General (Family Medicine) [name], RN as Registered Nurse [name], RN as Registered Nurse [name], RN as Oncology Nurse Navigator [name], RN as Oncology Nurse Navigator  DIAGNOSIS:    ICD-10-CM   1. Malignant neoplasm of upper-outer quadrant of left breast in female, estrogen receptor positive ([place])  C50.412    Z17.0     SUMMARY OF ONCOLOGIC HISTORY: Oncology History  Malignant neoplasm of upper-outer quadrant of left breast in female, estrogen receptor positive ([place])  08/25/2019 Initial Diagnosis   Palpable left breast mass x1 month. Diagnostic mammogram and [place] on 08/18/19 showed a 2.8cm mass at the 2 o'clock position, and one lymph node with mild cortical thickening in the left axilla. 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   10/18/2019 Cancer Staging   Staging form: Breast, AJCC 8th Edition - Pathologic stage from 10/18/2019: Stage IB (pT2, pN0(sn), cM0, G3, ER+, PR+, HER2-) - Signed by Nicholas Lose, MD on 10/18/2019   10/21/2019 Oncotype testing   32/20%   11/15/2019 -  Chemotherapy   The patient had dexamethasone (DECADRON) 4 MG tablet, 4 mg (100 % of original dose 4 mg), , , 1 of 1 cycle, Start date: 12/30/2019, End date: 01/09/2020 Dose modification: 4 mg (original dose 4 mg, Cycle 0) DOXOrubicin (ADRIAMYCIN) chemo injection 90 mg, 50 mg/m2 = 90 mg (83.3 % of original dose 60 mg/m2), Intravenous,  Once, 4 of 4 cycles Dose modification: 50 mg/m2 (original dose 60 mg/m2, Cycle 1, Reason: Provider Judgment) Administration: 90 mg (11/15/2019), 90 mg (11/29/2019), 90 mg (12/13/2019), 90 mg (12/27/2019) palonosetron (ALOXI)  injection 0.25 mg, 0.25 mg, Intravenous,  Once, 4 of 4 cycles Administration: 0.25 mg (11/15/2019), 0.25 mg (11/29/2019), 0.25 mg (12/13/2019), 0.25 mg (12/27/2019) pegfilgrastim-jmdb (FULPHILA) injection 6 mg, 6 mg, Subcutaneous,  Once, 4 of 4 cycles Administration: 6 mg (11/17/2019), 6 mg (12/01/2019), 6 mg (12/15/2019), 6 mg (12/29/2019) cyclophosphamide (CYTOXAN) 900 mg in sodium chloride 0.9 % 250 mL chemo infusion, 500 mg/m2 = 900 mg (83.3 % of original dose 600 mg/m2), Intravenous,  Once, 4 of 4 cycles Dose modification: 500 mg/m2 (original dose 600 mg/m2, Cycle 1, Reason: Provider Judgment) Administration: 900 mg (11/15/2019), 900 mg (11/29/2019), 900 mg (12/13/2019), 900 mg (12/27/2019) PACLitaxel (TAXOL) 144 mg in sodium chloride 0.9 % 250 mL chemo infusion (</= 79m/m2), 80 mg/m2 = 144 mg, Intravenous,  Once, 1 of 12 cycles Administration: 144 mg (01/09/2020) fosaprepitant (EMEND) 150 mg in sodium chloride 0.9 % 145 mL IVPB, 150 mg, Intravenous,  Once, 4 of 4 cycles Administration: 150 mg (11/15/2019), 150 mg (11/29/2019), 150 mg (12/13/2019), 150 mg (12/27/2019)  for chemotherapy treatment.      CHIEF COMPLIANT: Cycle 2 Taxol  INTERVAL HISTORY: Julia OSTERBERGis a 54y.o. with above-mentioned history of left breast cancer treated with lumpectomy andwho is currently on adjuvantchemotherapy with weekly Taxol after completing 4 cycles of dose dense Adriamycin and Cytoxan. She presents to the clinic today for a toxicity checkandcycle2.    ALLERGIES:  is allergic to propoxyphene n-acetaminophen, nabumetone, and rofecoxib.  MEDICATIONS:  Current Outpatient Medications  Medication Sig Dispense Refill  . albuterol (  PROVENTIL) (2.5 MG/3ML) 0.083% nebulizer solution Take 3 mLs (2.5 mg total) by nebulization every 6 (six) hours as needed for wheezing or shortness of breath. 75 mL 1  . albuterol (VENTOLIN HFA) 108 (90 Base) MCG/ACT inhaler Inhale 2 puffs into the lungs every 6 (six) hours as needed for  wheezing or shortness of breath. 18 g 3  . EPINEPHrine (PRIMATENE MIST IN) Inhale 1 puff into the lungs every 6 (six) hours as needed (wheezing/shortness of breath.).    Marland Kitchen gabapentin (NEURONTIN) 300 MG capsule Take up to 4 tabs a day (2 tabs twice a day) (Patient taking differently: Take 300 mg by mouth in the morning, at noon, and at bedtime. )    . HYDROcodone-acetaminophen (NORCO) 5-325 MG tablet Take 1-2 tablets by mouth every 6 (six) hours as needed for severe pain. Sedation caution 20 tablet 0  . ibuprofen (ADVIL) 600 MG tablet Take 1 tablet (600 mg total) by mouth every 8 (eight) hours as needed (For pain.  Take with food.). 90 tablet 2  . lidocaine-prilocaine (EMLA) cream Apply to affected area once 30 g 3  . metFORMIN (GLUCOPHAGE) 500 MG tablet Take 1 tablet (500 mg total) by mouth 2 (two) times daily with a meal. 60 tablet 3  . omeprazole (PRILOSEC OTC) 20 MG tablet Take 20 mg by mouth daily.    . ondansetron (ZOFRAN) 8 MG tablet DISSOLVE 1TAB ON THE TONGUE UP TO TWICE A DAY AS NEEDED**START ON THE THIRD DAY AFTER CHEMOTHERAPY 30 tablet 1  . prochlorperazine (COMPAZINE) 10 MG tablet Take 1 tablet (10 mg total) by mouth every 6 (six) hours as needed (Nausea or vomiting). 30 tablet 1   No current facility-administered medications for this visit.    PHYSICAL EXAMINATION: ECOG PERFORMANCE STATUS: 1 - Symptomatic but completely ambulatory  Vitals:   01/17/20 0842  BP: (!) 107/59  Pulse: 79  Resp: 18  Temp: 97.9 F (36.6 C)  SpO2: 97%   Filed Weights   01/17/20 0842  Weight: 154 lb 9.6 oz (70.1 kg)    LABORATORY DATA:  I have reviewed the data as listed CMP Latest Ref Rng & Units 01/09/2020 12/27/2019 12/13/2019  Glucose 70 - 99 mg/dL 353(H) 243(H) 238(H)  BUN 6 - 20 mg/dL 8 11 5(L)  Creatinine 0.44 - 1.00 mg/dL 0.76 0.69 0.71  Sodium 135 - 145 mmol/L 132(L) 136 133(L)  Potassium 3.5 - 5.1 mmol/L 4.1 4.4 4.1  Chloride 98 - 111 mmol/L 99 101 99  CO2 22 - 32 mmol/L 13(L) 20(L)  18(L)  Calcium 8.9 - 10.3 mg/dL 9.4 9.2 9.8  Total Protein 6.5 - 8.1 g/dL 7.4 6.8 8.1  Total Bilirubin 0.3 - 1.2 mg/dL 0.3 1.0 0.3  Alkaline Phos 38 - 126 U/L 207(H) 178(H) 195(H)  AST 15 - 41 U/L 35 63(H) 33  ALT 0 - 44 U/L 72(H) 100(H) 67(H)    Lab Results  Component Value Date   WBC 6.5 01/17/2020   HGB 12.6 01/17/2020   HCT 36.8 01/17/2020   MCV 95.6 01/17/2020   PLT 221 01/17/2020   NEUTROABS 4.8 01/17/2020    ASSESSMENT & PLAN:  Malignant neoplasm of upper-outer quadrant of left breast in female, estrogen receptor positive ([place]) 10/07/2019:Left lumpectomy and sentinel lymph node biopsy [name]): IDC with DCIS, 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 T2N0 stage Ib Oncotype  DX score 32: 20% risk of distant recurrence in 9 years  Treatment plan:  1. Adjuvant chemotherapy with dose dense Adriamycin and Cytoxan x4 followed by Taxol weekly x12 2. followed by radiation therapy  3. Followed by adjuvant antiestrogen therapy. ------------------------------------------------------------------------------------------------------------------------------------------------- Echocardiogram August 2021: EF 55 to 60% Current treatment:Completed 4 cycles ofdose dense Adriamycin and Cytoxan, today is cycle 2 Taxol Chemo toxicities: 1. Elevated blood sugars: Blood sugars were in the 200s.  She received insulin with previous treatment. 2.hair loss:Secondary to chemo 3.  Elevated LFTs: Probably secondary to fatty liver.  Monitoring closely. 4.  Mild thrombocytopenia: Improved 5.  Nausea due to Taxol: I added Aloxi today.  She had nausea for the entire week. 6.  Lack of taste and appetite: I reassured her that in a few weeks, some of her taste will improve.  Return to clinicweekly for Taxol every other week for follow-up with me.  No orders of  the defined types were placed in this encounter.  The patient has a good understanding of the overall plan. she agrees with it. she will call with any problems that may develop before the next visit here.  Total time spent: 30 mins including face to face time and time spent for planning, charting and coordination of care  Nicholas Lose, MD 01/17/2020  I, Cloyde Reams Dorshimer, am acting as scribe for Dr. Nicholas Lose.  I have reviewed the above documentation for accuracy and completeness, and I agree with the above.

## 2020-01-17 ENCOUNTER — Inpatient Hospital Stay (HOSPITAL_BASED_OUTPATIENT_CLINIC_OR_DEPARTMENT_OTHER): Payer: Medicaid Other | Admitting: Hematology and Oncology

## 2020-01-17 ENCOUNTER — Other Ambulatory Visit: Payer: Self-pay

## 2020-01-17 ENCOUNTER — Inpatient Hospital Stay: Payer: Medicaid Other

## 2020-01-17 ENCOUNTER — Encounter: Payer: Self-pay | Admitting: *Deleted

## 2020-01-17 DIAGNOSIS — Z170 Estrogen receptor positive status [ER+]: Secondary | ICD-10-CM | POA: Diagnosis not present

## 2020-01-17 DIAGNOSIS — C50412 Malignant neoplasm of upper-outer quadrant of left female breast: Secondary | ICD-10-CM

## 2020-01-17 DIAGNOSIS — Z5111 Encounter for antineoplastic chemotherapy: Secondary | ICD-10-CM | POA: Diagnosis not present

## 2020-01-17 LAB — CMP (CANCER CENTER ONLY)
ALT: 68 U/L — ABNORMAL HIGH (ref 0–44)
AST: 41 U/L (ref 15–41)
Albumin: 3.7 g/dL (ref 3.5–5.0)
Alkaline Phosphatase: 133 U/L — ABNORMAL HIGH (ref 38–126)
Anion gap: 12 (ref 5–15)
BUN: 7 mg/dL (ref 6–20)
CO2: 21 mmol/L — ABNORMAL LOW (ref 22–32)
Calcium: 9.7 mg/dL (ref 8.9–10.3)
Chloride: 105 mmol/L (ref 98–111)
Creatinine: 0.67 mg/dL (ref 0.44–1.00)
GFR, Estimated: >60 mL/min (ref >60)
Glucose, Bld: 168 mg/dL — ABNORMAL HIGH (ref 70–99)
Potassium: 3.7 mmol/L (ref 3.5–5.1)
Sodium: 138 mmol/L (ref 135–145)
Total Bilirubin: 0.5 mg/dL (ref 0.3–1.2)
Total Protein: 6.7 g/dL (ref 6.5–8.1)

## 2020-01-17 LAB — CBC WITH DIFFERENTIAL (CANCER CENTER ONLY)
Abs Immature Granulocytes: 0.04 10*3/uL (ref 0.00–0.07)
Basophils Absolute: 0.1 10*3/uL (ref 0.0–0.1)
Basophils Relative: 1 %
Eosinophils Absolute: 0 10*3/uL (ref 0.0–0.5)
Eosinophils Relative: 1 %
HCT: 36.8 % (ref 36.0–46.0)
Hemoglobin: 12.6 g/dL (ref 12.0–15.0)
Immature Granulocytes: 1 %
Lymphocytes Relative: 13 %
Lymphs Abs: 0.8 10*3/uL (ref 0.7–4.0)
MCH: 32.7 pg (ref 26.0–34.0)
MCHC: 34.2 g/dL (ref 30.0–36.0)
MCV: 95.6 fL (ref 80.0–100.0)
Monocytes Absolute: 0.7 10*3/uL (ref 0.1–1.0)
Monocytes Relative: 10 %
Neutro Abs: 4.8 10*3/uL (ref 1.7–7.7)
Neutrophils Relative %: 74 %
Platelet Count: 221 10*3/uL (ref 150–400)
RBC: 3.85 MIL/uL — ABNORMAL LOW (ref 3.87–5.11)
RDW: 15.8 % — ABNORMAL HIGH (ref 11.5–15.5)
WBC Count: 6.5 10*3/uL (ref 4.0–10.5)
nRBC: 0 % (ref 0.0–0.2)

## 2020-01-17 MED ORDER — DIPHENHYDRAMINE HCL 50 MG/ML IJ SOLN
25.0000 mg | Freq: Once | INTRAMUSCULAR | Status: AC
  Administered 2020-01-17: 25 mg via INTRAVENOUS

## 2020-01-17 MED ORDER — SODIUM CHLORIDE 0.9 % IV SOLN
Freq: Once | INTRAVENOUS | Status: AC
  Filled 2020-01-17: qty 250

## 2020-01-17 MED ORDER — BLOOD GLUCOSE MONITOR KIT: PACK | 0 refills | Status: DC

## 2020-01-17 MED ORDER — DIPHENHYDRAMINE HCL 50 MG/ML IJ SOLN
INTRAMUSCULAR | Status: AC
  Filled 2020-01-17: qty 1

## 2020-01-17 MED ORDER — PALONOSETRON HCL INJECTION 0.25 MG/5ML
INTRAVENOUS | Status: AC
  Filled 2020-01-17: qty 5

## 2020-01-17 MED ORDER — PALONOSETRON HCL INJECTION 0.25 MG/5ML
0.2500 mg | Freq: Once | INTRAVENOUS | Status: AC
  Administered 2020-01-17: 0.25 mg via INTRAVENOUS

## 2020-01-17 MED ORDER — FAMOTIDINE IN NACL 20-0.9 MG/50ML-% IV SOLN
INTRAVENOUS | Status: AC
  Filled 2020-01-17: qty 50

## 2020-01-17 MED ORDER — SODIUM CHLORIDE 0.9% FLUSH
10.0000 mL | INTRAVENOUS | Status: DC | PRN
  Administered 2020-01-17: 10 mL
  Filled 2020-01-17: qty 10

## 2020-01-17 MED ORDER — HEPARIN SOD (PORK) LOCK FLUSH 100 UNIT/ML IV SOLN
500.0000 [IU] | Freq: Once | INTRAVENOUS | Status: AC | PRN
  Administered 2020-01-17: 500 [IU]
  Filled 2020-01-17: qty 5

## 2020-01-17 MED ORDER — SODIUM CHLORIDE 0.9 % IV SOLN
80.0000 mg/m2 | Freq: Once | INTRAVENOUS | Status: AC
  Administered 2020-01-17: 144 mg via INTRAVENOUS
  Filled 2020-01-17: qty 24

## 2020-01-17 MED ORDER — FAMOTIDINE IN NACL 20-0.9 MG/50ML-% IV SOLN
20.0000 mg | Freq: Once | INTRAVENOUS | Status: AC
  Administered 2020-01-17: 20 mg via INTRAVENOUS

## 2020-01-17 NOTE — Patient Instructions (Signed)
[facility] Discharge Instructions for Patients Receiving Chemotherapy  Today you received the following chemotherapy agents: paclitaxel.  To help prevent nausea and vomiting after your treatment, we encourage you to take your nausea medication as directed.   If you develop nausea and vomiting that is not controlled by your nausea medication, call the clinic.   BELOW ARE SYMPTOMS THAT SHOULD BE REPORTED IMMEDIATELY:  *FEVER GREATER THAN 100.5 F  *CHILLS WITH OR WITHOUT FEVER  NAUSEA AND VOMITING THAT IS NOT CONTROLLED WITH YOUR NAUSEA MEDICATION  *UNUSUAL SHORTNESS OF BREATH  *UNUSUAL BRUISING OR BLEEDING  TENDERNESS IN MOUTH AND THROAT WITH OR WITHOUT PRESENCE OF ULCERS  *URINARY PROBLEMS  *BOWEL PROBLEMS  UNUSUAL RASH Items with * indicate a potential emergency and should be followed up as soon as possible.  Feel free to call the clinic should you have any questions or concerns. The clinic phone number is [phone].  Please show the CHEMO ALERT CARD at check-in to the Emergency Department and triage nurse.  Paclitaxel injection What is this medicine? PACLITAXEL (PAK li TAX el) is a chemotherapy drug. It targets fast dividing cells, like cancer cells, and causes these cells to die. This medicine is used to treat ovarian cancer, breast cancer, lung cancer, Kaposi's sarcoma, and other cancers. This medicine may be used for other purposes; ask your health care provider or pharmacist if you have questions. COMMON BRAND NAME(S): Onxol, Taxol What should I tell my health care provider before I take this medicine? They need to know if you have any of these conditions:  history of irregular heartbeat  liver disease  low blood counts, like low white cell, platelet, or red cell counts  lung or breathing disease, like asthma  tingling of the fingers or toes, or other nerve disorder  an unusual or allergic reaction to paclitaxel, alcohol, polyoxyethylated  castor oil, other chemotherapy, other medicines, foods, dyes, or preservatives  pregnant or trying to get pregnant  breast-feeding How should I use this medicine? This drug is given as an infusion into a vein. It is administered in a hospital or clinic by a specially trained health care professional. Talk to your pediatrician regarding the use of this medicine in children. Special care may be needed. Overdosage: If you think you have taken too much of this medicine contact a poison control center or emergency room at once. NOTE: This medicine is only for you. Do not share this medicine with others. What if I miss a dose? It is important not to miss your dose. Call your doctor or health care professional if you are unable to keep an appointment. What may interact with this medicine? Do not take this medicine with any of the following medications:  disulfiram  metronidazole This medicine may also interact with the following medications:  antiviral medicines for hepatitis, HIV or AIDS  certain antibiotics like erythromycin and clarithromycin  certain medicines for fungal infections like ketoconazole and itraconazole  certain medicines for seizures like carbamazepine, phenobarbital, phenytoin  gemfibrozil  nefazodone  rifampin  St. John's wort This list may not describe all possible interactions. Give your health care provider a list of all the medicines, herbs, non-prescription drugs, or dietary supplements you use. Also tell them if you smoke, drink alcohol, or use illegal drugs. Some items may interact with your medicine. What should I watch for while using this medicine? Your condition will be monitored carefully while you are receiving this medicine. You will need important blood work   done while you are taking this medicine. This medicine can cause serious allergic reactions. To reduce your risk you will need to take other medicine(s) before treatment with this medicine. If you  experience allergic reactions like skin rash, itching or hives, swelling of the face, lips, or tongue, tell your doctor or health care professional right away. In some cases, you may be given additional medicines to help with side effects. Follow all directions for their use. This drug may make you feel generally unwell. This is not uncommon, as chemotherapy can affect healthy cells as well as cancer cells. Report any side effects. Continue your course of treatment even though you feel ill unless your doctor tells you to stop. Call your doctor or health care professional for advice if you get a fever, chills or sore throat, or other symptoms of a cold or flu. Do not treat yourself. This drug decreases your body's ability to fight infections. Try to avoid being around people who are sick. This medicine may increase your risk to bruise or bleed. Call your doctor or health care professional if you notice any unusual bleeding. Be careful brushing and flossing your teeth or using a toothpick because you may get an infection or bleed more easily. If you have any dental work done, tell your dentist you are receiving this medicine. Avoid taking products that contain aspirin, acetaminophen, ibuprofen, naproxen, or ketoprofen unless instructed by your doctor. These medicines may hide a fever. Do not become pregnant while taking this medicine. Women should inform their doctor if they wish to become pregnant or think they might be pregnant. There is a potential for serious side effects to an unborn child. Talk to your health care professional or pharmacist for more information. Do not breast-feed an infant while taking this medicine. Men are advised not to father a child while receiving this medicine. This product may contain alcohol. Ask your pharmacist or healthcare provider if this medicine contains alcohol. Be sure to tell all healthcare providers you are taking this medicine. Certain medicines, like metronidazole  and disulfiram, can cause an unpleasant reaction when taken with alcohol. The reaction includes flushing, headache, nausea, vomiting, sweating, and increased thirst. The reaction can last from 30 minutes to several hours. What side effects may I notice from receiving this medicine? Side effects that you should report to your doctor or health care professional as soon as possible:  allergic reactions like skin rash, itching or hives, swelling of the face, lips, or tongue  breathing problems  changes in vision  fast, irregular heartbeat  high or low blood pressure  mouth sores  pain, tingling, numbness in the hands or feet  signs of decreased platelets or bleeding - bruising, pinpoint red spots on the skin, black, tarry stools, blood in the urine  signs of decreased red blood cells - unusually weak or tired, feeling faint or lightheaded, falls  signs of infection - fever or chills, cough, sore throat, pain or difficulty passing urine  signs and symptoms of liver injury like dark yellow or brown urine; general ill feeling or flu-like symptoms; light-colored stools; loss of appetite; nausea; right upper belly pain; unusually weak or tired; yellowing of the eyes or skin  swelling of the ankles, feet, hands  unusually slow heartbeat Side effects that usually do not require medical attention (report to your doctor or health care professional if they continue or are bothersome):  diarrhea  hair loss  loss of appetite  muscle or joint pain    nausea, vomiting  pain, redness, or irritation at site where injected  tiredness This list may not describe all possible side effects. Call your doctor for medical advice about side effects. You may report side effects to FDA at 1-800-FDA-1088. Where should I keep my medicine? This drug is given in a hospital or clinic and will not be stored at home. NOTE: This sheet is a summary. It may not cover all possible information. If you have  questions about this medicine, talk to your doctor, pharmacist, or health care provider.  2020 Elsevier/Gold Standard (2016-11-25 13:14:55)    

## 2020-01-17 NOTE — Assessment & Plan Note (Signed)
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 T2N0 stage Ib Oncotype DX score 32: 20% risk of distant recurrence in 9 years  Treatment plan:  1. Adjuvant chemotherapy with dose dense Adriamycin and Cytoxan x4 followed by Taxol weekly x12 2. followed by radiation therapy  3. Followed by adjuvant antiestrogen therapy. ------------------------------------------------------------------------------------------------------------------------------------------------- Echocardiogram August 2021: EF 55 to 60% Current treatment:Completed 4 cycles ofdose dense Adriamycin and Cytoxan, today is cycle 1 Taxol Chemo toxicities: 1. Elevated blood sugars: Blood sugars were in the 200s.  She received insulin with previous treatment. 2.hair loss:Secondary to chemo 3.  Elevated LFTs: I discussed with her that we will need to monitor this closely.  It is okay to treat as long as the AST and ALT are below 150.  Instructed her to walk and watch her diet very closely. 4.  Mild thrombocytopenia: Monitoring   Return to clinicweekly for Taxol every other week for follow-up with me.

## 2020-01-18 ENCOUNTER — Telehealth: Payer: Self-pay | Admitting: Hematology and Oncology

## 2020-01-18 NOTE — Telephone Encounter (Signed)
No 10/12 los, no changes made to pt schedule   

## 2020-01-23 ENCOUNTER — Ambulatory Visit: Payer: Medicaid Other | Admitting: Family Medicine

## 2020-01-24 ENCOUNTER — Inpatient Hospital Stay: Payer: Medicaid Other

## 2020-01-24 ENCOUNTER — Other Ambulatory Visit: Payer: Self-pay

## 2020-01-24 VITALS — BP 105/60 | HR 83 | Temp 98.4°F | Resp 18

## 2020-01-24 DIAGNOSIS — Z95828 Presence of other vascular implants and grafts: Secondary | ICD-10-CM

## 2020-01-24 DIAGNOSIS — Z170 Estrogen receptor positive status [ER+]: Secondary | ICD-10-CM

## 2020-01-24 DIAGNOSIS — Z5111 Encounter for antineoplastic chemotherapy: Secondary | ICD-10-CM | POA: Diagnosis not present

## 2020-01-24 LAB — CMP (CANCER CENTER ONLY)
ALT: 57 U/L — ABNORMAL HIGH (ref 0–44)
AST: 30 U/L (ref 15–41)
Albumin: 3.9 g/dL (ref 3.5–5.0)
Alkaline Phosphatase: 143 U/L — ABNORMAL HIGH (ref 38–126)
Anion gap: 8 (ref 5–15)
BUN: 8 mg/dL (ref 6–20)
CO2: 26 mmol/L (ref 22–32)
Calcium: 9.6 mg/dL (ref 8.9–10.3)
Chloride: 102 mmol/L (ref 98–111)
Creatinine: 0.66 mg/dL (ref 0.44–1.00)
GFR, Estimated: >60 mL/min (ref >60)
Glucose, Bld: 101 mg/dL — ABNORMAL HIGH (ref 70–99)
Potassium: 3.7 mmol/L (ref 3.5–5.1)
Sodium: 136 mmol/L (ref 135–145)
Total Bilirubin: 0.5 mg/dL (ref 0.3–1.2)
Total Protein: 6.8 g/dL (ref 6.5–8.1)

## 2020-01-24 LAB — CBC WITH DIFFERENTIAL (CANCER CENTER ONLY)
Abs Immature Granulocytes: 0.06 10*3/uL (ref 0.00–0.07)
Basophils Absolute: 0.1 10*3/uL (ref 0.0–0.1)
Basophils Relative: 1 %
Eosinophils Absolute: 0.1 10*3/uL (ref 0.0–0.5)
Eosinophils Relative: 1 %
HCT: 36.9 % (ref 36.0–46.0)
Hemoglobin: 12.9 g/dL (ref 12.0–15.0)
Immature Granulocytes: 1 %
Lymphocytes Relative: 24 %
Lymphs Abs: 1.7 10*3/uL (ref 0.7–4.0)
MCH: 33.7 pg (ref 26.0–34.0)
MCHC: 35 g/dL (ref 30.0–36.0)
MCV: 96.3 fL (ref 80.0–100.0)
Monocytes Absolute: 0.6 10*3/uL (ref 0.1–1.0)
Monocytes Relative: 8 %
Neutro Abs: 4.5 10*3/uL (ref 1.7–7.7)
Neutrophils Relative %: 65 %
Platelet Count: 197 10*3/uL (ref 150–400)
RBC: 3.83 MIL/uL — ABNORMAL LOW (ref 3.87–5.11)
RDW: 15.1 % (ref 11.5–15.5)
WBC Count: 7 10*3/uL (ref 4.0–10.5)
nRBC: 0 % (ref 0.0–0.2)

## 2020-01-24 MED ORDER — PALONOSETRON HCL INJECTION 0.25 MG/5ML
0.2500 mg | Freq: Once | INTRAVENOUS | Status: AC
  Administered 2020-01-24: 0.25 mg via INTRAVENOUS

## 2020-01-24 MED ORDER — FAMOTIDINE IN NACL 20-0.9 MG/50ML-% IV SOLN
INTRAVENOUS | Status: AC
  Filled 2020-01-24: qty 50

## 2020-01-24 MED ORDER — SODIUM CHLORIDE 0.9 % IV SOLN
Freq: Once | INTRAVENOUS | Status: AC
  Filled 2020-01-24: qty 250

## 2020-01-24 MED ORDER — FAMOTIDINE IN NACL 20-0.9 MG/50ML-% IV SOLN
20.0000 mg | Freq: Once | INTRAVENOUS | Status: AC
  Administered 2020-01-24: 20 mg via INTRAVENOUS

## 2020-01-24 MED ORDER — PALONOSETRON HCL INJECTION 0.25 MG/5ML
INTRAVENOUS | Status: AC
  Filled 2020-01-24: qty 5

## 2020-01-24 MED ORDER — HEPARIN SOD (PORK) LOCK FLUSH 100 UNIT/ML IV SOLN
500.0000 [IU] | Freq: Once | INTRAVENOUS | Status: AC | PRN
  Administered 2020-01-24: 500 [IU]
  Filled 2020-01-24: qty 5

## 2020-01-24 MED ORDER — SODIUM CHLORIDE 0.9 % IV SOLN
80.0000 mg/m2 | Freq: Once | INTRAVENOUS | Status: AC
  Administered 2020-01-24: 144 mg via INTRAVENOUS
  Filled 2020-01-24: qty 24

## 2020-01-24 MED ORDER — DIPHENHYDRAMINE HCL 50 MG/ML IJ SOLN
25.0000 mg | Freq: Once | INTRAMUSCULAR | Status: AC
  Administered 2020-01-24: 25 mg via INTRAVENOUS

## 2020-01-24 MED ORDER — SODIUM CHLORIDE 0.9% FLUSH
10.0000 mL | Freq: Once | INTRAVENOUS | Status: AC
  Administered 2020-01-24: 10 mL
  Filled 2020-01-24: qty 10

## 2020-01-24 MED ORDER — SODIUM CHLORIDE 0.9% FLUSH
10.0000 mL | INTRAVENOUS | Status: DC | PRN
  Administered 2020-01-24: 10 mL
  Filled 2020-01-24: qty 10

## 2020-01-24 MED ORDER — DIPHENHYDRAMINE HCL 50 MG/ML IJ SOLN
INTRAMUSCULAR | Status: AC
  Filled 2020-01-24: qty 1

## 2020-01-24 NOTE — Patient Instructions (Signed)
[facility] Discharge Instructions for Patients Receiving Chemotherapy  Today you received the following chemotherapy agents:  Taxol.  To help prevent nausea and vomiting after your treatment, we encourage you to take your nausea medication as directed.   If you develop nausea and vomiting that is not controlled by your nausea medication, call the clinic.   BELOW ARE SYMPTOMS THAT SHOULD BE REPORTED IMMEDIATELY:  *FEVER GREATER THAN 100.5 F  *CHILLS WITH OR WITHOUT FEVER  NAUSEA AND VOMITING THAT IS NOT CONTROLLED WITH YOUR NAUSEA MEDICATION  *UNUSUAL SHORTNESS OF BREATH  *UNUSUAL BRUISING OR BLEEDING  TENDERNESS IN MOUTH AND THROAT WITH OR WITHOUT PRESENCE OF ULCERS  *URINARY PROBLEMS  *BOWEL PROBLEMS  UNUSUAL RASH Items with * indicate a potential emergency and should be followed up as soon as possible.  Feel free to call the clinic should you have any questions or concerns. The clinic phone number is [phone].  Please show the CHEMO ALERT CARD at check-in to the Emergency Department and triage nurse.   

## 2020-01-30 ENCOUNTER — Encounter: Payer: Self-pay | Admitting: Family Medicine

## 2020-01-30 ENCOUNTER — Other Ambulatory Visit: Payer: Self-pay

## 2020-01-30 ENCOUNTER — Ambulatory Visit (INDEPENDENT_AMBULATORY_CARE_PROVIDER_SITE_OTHER): Payer: Medicaid Other | Admitting: Family Medicine

## 2020-01-30 VITALS — BP 106/70 | HR 90 | Temp 97.2°F | Ht 64.0 in | Wt 153.0 lb

## 2020-01-30 DIAGNOSIS — M5432 Sciatica, left side: Secondary | ICD-10-CM | POA: Diagnosis not present

## 2020-01-30 DIAGNOSIS — E119 Type 2 diabetes mellitus without complications: Secondary | ICD-10-CM

## 2020-01-30 MED ORDER — GABAPENTIN 300 MG PO CAPS
ORAL_CAPSULE | ORAL | 3 refills | Status: DC
Start: 2020-01-30 — End: 2020-10-19

## 2020-01-30 MED ORDER — HYDROCODONE-ACETAMINOPHEN 5-325 MG PO TABS
1.0000 | ORAL_TABLET | Freq: Four times a day (QID) | ORAL | 0 refills | Status: DC | PRN
Start: 2020-01-30 — End: 2020-02-21

## 2020-01-30 NOTE — Progress Notes (Signed)
This visit occurred during the SARS-CoV-2 public health emergency.  Safety protocols were in place, including screening questions prior to the visit, additional usage of staff PPE, and extensive cleaning of exam room while observing appropriate contact time as indicated for disinfecting solutions.  ================================ ASSESSMENT & PLAN:  Malignant neoplasm of upper-outer quadrant of left breast in female, estrogen receptor positive ([place]) 10/07/2019:Left lumpectomy and sentinel lymph node biopsy [name]): IDC with DCIS, 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 T2N0 stage Ib Oncotype DX score 32: 20% risk of distant recurrence in 9 years  Treatment plan:  1. Adjuvant chemotherapy with dose dense Adriamycin and Cytoxan x4 followed by Taxol weekly x12 2. followed by radiation therapy  3. Followed by adjuvant antiestrogen therapy. ------------------------------------------------------------------------------------------------------------------------------------------------- Echocardiogram August 2021: EF 55 to 60% Current treatment:Completed 4 cycles ofdose dense Adriamycin and Cytoxan, today is cycle 2 Taxol Chemo toxicities: 1.Elevated blood sugars: Blood sugars were in the 200s.  She received insulin with previous treatment. 2.hair loss:Secondary to chemo 3.Elevated LFTs: Probably secondary to fatty liver.  Monitoring closely. 4.Mild thrombocytopenia: Improved 5.  Nausea due to Taxol: I added Aloxi today.  She had nausea for the entire week. 6.  Lack of taste and appetite: I reassured her that in a few weeks, some of her taste will improve.  Return to clinicweekly for Taxol every other week for follow-up with me. ================================  Breast cancer.  Per onc, d/w pt.  We talked about hair loss and that is a difficult  adjustment.  She has oncology follow-up pending.  Chronic pain.  Still on gabapentin with L sciatica.  No R lower back pain.  Intermittent pain.  Gabapentin helps.  Hydrocodone used prn, for breast pain and back pain.  It helps both.  She is not using hydrocodone daily.    DM2.  Last A1c >8.  On metformin BID w/o ADE on med.  Sugar has been about ~130-170s at home, higher after chemo tx.  No lows.  Some tingling in feet, likely related to chemo.    D/w pt about routine vaccination.  Rare use of SABA.  D/w pt.    Meds, vitals, and allergies reviewed.   ROS: Per HPI unless specifically indicated in ROS section   GEN: nad, alert and oriented, balding noted from chemotherapy HEENT: ncat NECK: supple w/o LA CV: rrr PULM: ctab, no inc wob ABD: soft, +bs EXT: no edema SKIN: no acute rash  Diabetic foot exam: Normal inspection No skin breakdown No calluses  Normal DP pulses Normal sensation to light touch and monofilament Nails normal  At least 30 minutes were devoted to patient care in this encounter (this can potentially include time spent reviewing the patient's file/history, interviewing and examining the patient, counseling/reviewing plan with patient, ordering referrals, ordering tests, reviewing relevant laboratory or x-ray data, and documenting the encounter).

## 2020-01-30 NOTE — Patient Instructions (Signed)
Don't change your meds for now.  Update me as needed.  I want to recheck your A1c in about 3 months.  We'll go from there.  Take care.  Glad to see you.  Ask the cancer clinic about routine vaccination.

## 2020-01-30 NOTE — Progress Notes (Signed)
Patient Care Team: Tonia Ghent, MD as PCP - General (Family Medicine) [name], RN as Registered Nurse [name], RN as Registered Nurse [name], RN as Oncology Nurse Navigator [name], RN as Oncology Nurse Navigator  DIAGNOSIS:    ICD-10-CM   1. Malignant neoplasm of upper-outer quadrant of left breast in female, estrogen receptor positive ([place])  C50.412    Z17.0     SUMMARY OF ONCOLOGIC HISTORY: Oncology History  Malignant neoplasm of upper-outer quadrant of left breast in female, estrogen receptor positive ([place])  08/25/2019 Initial Diagnosis   Palpable left breast mass x1 month. Diagnostic mammogram and [place] on 08/18/19 showed a 2.8cm mass at the 2 o'clock position, and one lymph node with mild cortical thickening in the left axilla. 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   10/18/2019 Cancer Staging   Staging form: Breast, AJCC 8th Edition - Pathologic stage from 10/18/2019: Stage IB (pT2, pN0(sn), cM0, G3, ER+, PR+, HER2-) - Signed by Nicholas Lose, MD on 10/18/2019   10/21/2019 Oncotype testing   32/20%   11/15/2019 -  Chemotherapy   The patient had dexamethasone (DECADRON) 4 MG tablet, 4 mg (100 % of original dose 4 mg), , , 1 of 1 cycle, Start date: 12/30/2019, End date: 01/09/2020 Dose modification: 4 mg (original dose 4 mg, Cycle 0) DOXOrubicin (ADRIAMYCIN) chemo injection 90 mg, 50 mg/m2 = 90 mg (83.3 % of original dose 60 mg/m2), Intravenous,  Once, 4 of 4 cycles Dose modification: 50 mg/m2 (original dose 60 mg/m2, Cycle 1, Reason: Provider Judgment) Administration: 90 mg (11/15/2019), 90 mg (11/29/2019), 90 mg (12/13/2019), 90 mg (12/27/2019) palonosetron (ALOXI)  injection 0.25 mg, 0.25 mg, Intravenous,  Once, 6 of 15 cycles Administration: 0.25 mg (11/15/2019), 0.25 mg (11/29/2019), 0.25 mg (12/13/2019), 0.25 mg (12/27/2019), 0.25 mg (01/17/2020), 0.25 mg (01/24/2020) pegfilgrastim-jmdb (FULPHILA) injection 6 mg, 6 mg, Subcutaneous,  Once, 4 of 4 cycles Administration: 6 mg (11/17/2019), 6 mg (12/01/2019), 6 mg (12/15/2019), 6 mg (12/29/2019) cyclophosphamide (CYTOXAN) 900 mg in sodium chloride 0.9 % 250 mL chemo infusion, 500 mg/m2 = 900 mg (83.3 % of original dose 600 mg/m2), Intravenous,  Once, 4 of 4 cycles Dose modification: 500 mg/m2 (original dose 600 mg/m2, Cycle 1, Reason: Provider Judgment) Administration: 900 mg (11/15/2019), 900 mg (11/29/2019), 900 mg (12/13/2019), 900 mg (12/27/2019) PACLitaxel (TAXOL) 144 mg in sodium chloride 0.9 % 250 mL chemo infusion (</= 9m/m2), 80 mg/m2 = 144 mg, Intravenous,  Once, 3 of 12 cycles Administration: 144 mg (01/09/2020), 144 mg (01/17/2020), 144 mg (01/24/2020) fosaprepitant (EMEND) 150 mg in sodium chloride 0.9 % 145 mL IVPB, 150 mg, Intravenous,  Once, 4 of 4 cycles Administration: 150 mg (11/15/2019), 150 mg (11/29/2019), 150 mg (12/13/2019), 150 mg (12/27/2019)  for chemotherapy treatment.      CHIEF COMPLIANT: Cycle4 Taxol  INTERVAL HISTORY: Julia MUNDOis a 54y.o. with above-mentioned history of left breast cancer treated with lumpectomy andwho is currently on adjuvantchemotherapy withweekly Taxol after completing 4 cycles ofdose dense Adriamycin and Cytoxan. She presents to the clinic today for a toxicity checkandcycle4.  no neuropathy  ALLERGIES:  is allergic to propoxyphene n-acetaminophen, nabumetone, and rofecoxib.  MEDICATIONS:  Current  Outpatient Medications  Medication Sig Dispense Refill  . albuterol (PROVENTIL) (2.5 MG/3ML) 0.083% nebulizer solution Take 3 mLs (2.5 mg total) by nebulization every 6 (six) hours as needed for wheezing or shortness of breath. 75 mL 1  . albuterol (VENTOLIN  HFA) 108 (90 Base) MCG/ACT inhaler Inhale 2 puffs into the lungs every 6 (six) hours as needed for wheezing or shortness of breath. 18 g 3  . blood glucose meter kit and supplies KIT Dispense based on patient and insurance preference. Use up to four times daily as directed. (FOR ICD-9 250.00, 250.01). 1 each 0  . gabapentin (NEURONTIN) 300 MG capsule Take up to 4 tabs a day (2 tabs twice a day) 120 capsule 3  . HYDROcodone-acetaminophen (NORCO) 5-325 MG tablet Take 1-2 tablets by mouth every 6 (six) hours as needed for severe pain. Sedation caution 30 tablet 0  . ibuprofen (ADVIL) 600 MG tablet Take 1 tablet (600 mg total) by mouth every 8 (eight) hours as needed (For pain.  Take with food.). 90 tablet 2  . metFORMIN (GLUCOPHAGE) 500 MG tablet Take 1 tablet (500 mg total) by mouth 2 (two) times daily with a meal. 60 tablet 3  . omeprazole (PRILOSEC OTC) 20 MG tablet Take 20 mg by mouth daily.    . ondansetron (ZOFRAN) 8 MG tablet DISSOLVE 1TAB ON THE TONGUE UP TO TWICE A DAY AS NEEDED**START ON THE THIRD DAY AFTER CHEMOTHERAPY 30 tablet 1  . prochlorperazine (COMPAZINE) 10 MG tablet Take 1 tablet (10 mg total) by mouth every 6 (six) hours as needed (Nausea or vomiting). 30 tablet 1   No current facility-administered medications for this visit.    PHYSICAL EXAMINATION: ECOG PERFORMANCE STATUS: 1 - Symptomatic but completely ambulatory  Vitals:   01/31/20 1204  BP: 114/65  Pulse: 77  Resp: 18  Temp: (!) 96.2 F (35.7 C)  SpO2: 97%   Filed Weights   01/31/20 1204  Weight: 155 lb 1.6 oz (70.4 kg)    LABORATORY DATA:  I have reviewed the data as listed CMP Latest Ref Rng & Units 01/31/2020 01/24/2020 01/17/2020  Glucose 70 - 99 mg/dL 207(H) 101(H) 168(H)  BUN 6 - 20 mg/dL '8 8 7  ' Creatinine 0.44 - 1.00 mg/dL 0.66 0.66 0.67  Sodium 135 - 145 mmol/L 139 136 138  Potassium 3.5 - 5.1 mmol/L 3.5 3.7 3.7  Chloride 98 - 111 mmol/L 109 102 105  CO2 22 - 32 mmol/L 22 26 21(L)  Calcium 8.9 -  10.3 mg/dL 9.3 9.6 9.7  Total Protein 6.5 - 8.1 g/dL 6.0(L) 6.8 6.7  Total Bilirubin 0.3 - 1.2 mg/dL 0.4 0.5 0.5  Alkaline Phos 38 - 126 U/L 117 143(H) 133(H)  AST 15 - 41 U/L 26 30 41  ALT 0 - 44 U/L 44 57(H) 68(H)    Lab Results  Component Value Date   WBC 5.7 01/31/2020   HGB 12.4 01/31/2020   HCT 36.4 01/31/2020   MCV 98.4 01/31/2020   PLT 175 01/31/2020   NEUTROABS 3.6 01/31/2020    ASSESSMENT & PLAN:  Malignant neoplasm of upper-outer quadrant of left breast in female, estrogen receptor positive ([place]) 10/07/2019:Left lumpectomy and sentinel lymph node biopsy [name]): IDC with DCIS, 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 T2N0 stage Ib Oncotype DX score 32: 20% risk of distant recurrence in 9 years  Treatment plan:  1. Adjuvant chemotherapy with dose dense Adriamycin and Cytoxan x4 followed by Taxol weekly x12 2. followed by radiation therapy  3. Followed by adjuvant antiestrogen therapy. ------------------------------------------------------------------------------------------------------------------------------------------------- Echocardiogram August 2021: EF 55 to 60% Current treatment:Completed 4 cycles ofdose dense Adriamycin and Cytoxan, today is cycle 4 Taxol Chemo toxicities: 1.Elevated blood sugars: still elevated 2.hair loss:Secondary to chemo 3.Elevated LFTs: Probably secondary to fatty liver.  better today 4.Mild thrombocytopenia: Improved 5.  Nausea due to Taxol: I added Aloxi today.  Improved 6.  Lack of taste and appetite: I reassured her that in a few weeks, some of her taste will improve.  Return to clinicweekly for Taxol every other week for follow-up with me.    No orders of the defined types were placed in this encounter.  The patient has a good understanding of the overall plan. she agrees  with it. she will call with any problems that may develop before the next visit here.  Total time spent: 30 mins including face to face time and time spent for planning, charting and coordination of care  Nicholas Lose, MD 01/31/2020  I, Cloyde Reams Dorshimer, am acting as scribe for Dr. Nicholas Lose.  I have reviewed the above documentation for accuracy and completeness, and I agree with the above.

## 2020-01-31 ENCOUNTER — Inpatient Hospital Stay: Payer: Medicaid Other

## 2020-01-31 ENCOUNTER — Inpatient Hospital Stay (HOSPITAL_BASED_OUTPATIENT_CLINIC_OR_DEPARTMENT_OTHER): Payer: Medicaid Other | Admitting: Hematology and Oncology

## 2020-01-31 ENCOUNTER — Other Ambulatory Visit: Payer: Self-pay

## 2020-01-31 DIAGNOSIS — Z170 Estrogen receptor positive status [ER+]: Secondary | ICD-10-CM

## 2020-01-31 DIAGNOSIS — C50412 Malignant neoplasm of upper-outer quadrant of left female breast: Secondary | ICD-10-CM

## 2020-01-31 DIAGNOSIS — Z95828 Presence of other vascular implants and grafts: Secondary | ICD-10-CM

## 2020-01-31 DIAGNOSIS — Z5111 Encounter for antineoplastic chemotherapy: Secondary | ICD-10-CM | POA: Diagnosis not present

## 2020-01-31 LAB — CMP (CANCER CENTER ONLY)
ALT: 44 U/L (ref 0–44)
AST: 26 U/L (ref 15–41)
Albumin: 3.5 g/dL (ref 3.5–5.0)
Alkaline Phosphatase: 117 U/L (ref 38–126)
Anion gap: 8 (ref 5–15)
BUN: 8 mg/dL (ref 6–20)
CO2: 22 mmol/L (ref 22–32)
Calcium: 9.3 mg/dL (ref 8.9–10.3)
Chloride: 109 mmol/L (ref 98–111)
Creatinine: 0.66 mg/dL (ref 0.44–1.00)
GFR, Estimated: >60 mL/min (ref >60)
Glucose, Bld: 207 mg/dL — ABNORMAL HIGH (ref 70–99)
Potassium: 3.5 mmol/L (ref 3.5–5.1)
Sodium: 139 mmol/L (ref 135–145)
Total Bilirubin: 0.4 mg/dL (ref 0.3–1.2)
Total Protein: 6 g/dL — ABNORMAL LOW (ref 6.5–8.1)

## 2020-01-31 LAB — CBC WITH DIFFERENTIAL (CANCER CENTER ONLY)
Abs Immature Granulocytes: 0.03 10*3/uL (ref 0.00–0.07)
Basophils Absolute: 0.1 10*3/uL (ref 0.0–0.1)
Basophils Relative: 1 %
Eosinophils Absolute: 0.2 10*3/uL (ref 0.0–0.5)
Eosinophils Relative: 4 %
HCT: 36.4 % (ref 36.0–46.0)
Hemoglobin: 12.4 g/dL (ref 12.0–15.0)
Immature Granulocytes: 1 %
Lymphocytes Relative: 24 %
Lymphs Abs: 1.4 10*3/uL (ref 0.7–4.0)
MCH: 33.5 pg (ref 26.0–34.0)
MCHC: 34.1 g/dL (ref 30.0–36.0)
MCV: 98.4 fL (ref 80.0–100.0)
Monocytes Absolute: 0.5 10*3/uL (ref 0.1–1.0)
Monocytes Relative: 8 %
Neutro Abs: 3.6 10*3/uL (ref 1.7–7.7)
Neutrophils Relative %: 62 %
Platelet Count: 175 10*3/uL (ref 150–400)
RBC: 3.7 MIL/uL — ABNORMAL LOW (ref 3.87–5.11)
RDW: 14.3 % (ref 11.5–15.5)
WBC Count: 5.7 10*3/uL (ref 4.0–10.5)
nRBC: 0 % (ref 0.0–0.2)

## 2020-01-31 MED ORDER — PALONOSETRON HCL INJECTION 0.25 MG/5ML
0.2500 mg | Freq: Once | INTRAVENOUS | Status: AC
  Administered 2020-01-31: 0.25 mg via INTRAVENOUS

## 2020-01-31 MED ORDER — SODIUM CHLORIDE 0.9% FLUSH
10.0000 mL | INTRAVENOUS | Status: DC | PRN
  Administered 2020-01-31: 10 mL
  Filled 2020-01-31: qty 10

## 2020-01-31 MED ORDER — DIPHENHYDRAMINE HCL 50 MG/ML IJ SOLN
INTRAMUSCULAR | Status: AC
  Filled 2020-01-31: qty 1

## 2020-01-31 MED ORDER — SODIUM CHLORIDE 0.9 % IV SOLN
Freq: Once | INTRAVENOUS | Status: AC
  Filled 2020-01-31: qty 250

## 2020-01-31 MED ORDER — SODIUM CHLORIDE 0.9 % IV SOLN
80.0000 mg/m2 | Freq: Once | INTRAVENOUS | Status: AC
  Administered 2020-01-31: 144 mg via INTRAVENOUS
  Filled 2020-01-31: qty 24

## 2020-01-31 MED ORDER — SODIUM CHLORIDE 0.9% FLUSH
10.0000 mL | Freq: Once | INTRAVENOUS | Status: AC
  Administered 2020-01-31: 10 mL
  Filled 2020-01-31: qty 10

## 2020-01-31 MED ORDER — HEPARIN SOD (PORK) LOCK FLUSH 100 UNIT/ML IV SOLN
500.0000 [IU] | Freq: Once | INTRAVENOUS | Status: AC | PRN
  Administered 2020-01-31: 500 [IU]
  Filled 2020-01-31: qty 5

## 2020-01-31 MED ORDER — PALONOSETRON HCL INJECTION 0.25 MG/5ML
INTRAVENOUS | Status: AC
  Filled 2020-01-31: qty 5

## 2020-01-31 MED ORDER — DIPHENHYDRAMINE HCL 50 MG/ML IJ SOLN
25.0000 mg | Freq: Once | INTRAMUSCULAR | Status: AC
  Administered 2020-01-31: 25 mg via INTRAVENOUS

## 2020-01-31 MED ORDER — FAMOTIDINE IN NACL 20-0.9 MG/50ML-% IV SOLN
20.0000 mg | Freq: Once | INTRAVENOUS | Status: AC
  Administered 2020-01-31: 20 mg via INTRAVENOUS

## 2020-01-31 MED ORDER — FAMOTIDINE IN NACL 20-0.9 MG/50ML-% IV SOLN
INTRAVENOUS | Status: AC
  Filled 2020-01-31: qty 50

## 2020-01-31 NOTE — Patient Instructions (Signed)
[facility] Discharge Instructions for Patients Receiving Chemotherapy  Today you received the following chemotherapy agents paclitaxel (Taxol) To help prevent nausea and vomiting after your treatment, we encourage you to take your nausea medication as directed   If you develop nausea and vomiting that is not controlled by your nausea medication, call the clinic.   BELOW ARE SYMPTOMS THAT SHOULD BE REPORTED IMMEDIATELY:  *FEVER GREATER THAN 100.5 F  *CHILLS WITH OR WITHOUT FEVER  NAUSEA AND VOMITING THAT IS NOT CONTROLLED WITH YOUR NAUSEA MEDICATION  *UNUSUAL SHORTNESS OF BREATH  *UNUSUAL BRUISING OR BLEEDING  TENDERNESS IN MOUTH AND THROAT WITH OR WITHOUT PRESENCE OF ULCERS  *URINARY PROBLEMS  *BOWEL PROBLEMS  UNUSUAL RASH Items with * indicate a potential emergency and should be followed up as soon as possible.  Feel free to call the clinic should you have any questions or concerns. The clinic phone number is [phone].  Please show the CHEMO ALERT CARD at check-in to the Emergency Department and triage nurse.   

## 2020-01-31 NOTE — Assessment & Plan Note (Signed)
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 T2N0 stage Ib Oncotype DX score 32: 20% risk of distant recurrence in 9 years  Treatment plan:  1. Adjuvant chemotherapy with dose dense Adriamycin and Cytoxan x4 followed by Taxol weekly x12 2. followed by radiation therapy  3. Followed by adjuvant antiestrogen therapy. ------------------------------------------------------------------------------------------------------------------------------------------------- Echocardiogram August 2021: EF 55 to 60% Current treatment:Completed 4 cycles ofdose dense Adriamycin and Cytoxan, today is cycle 4 Taxol Chemo toxicities: 1.Elevated blood sugars: Blood sugars were in the 200s.  She received insulin with previous treatment. 2.hair loss:Secondary to chemo 3.Elevated LFTs: Probably secondary to fatty liver.  Monitoring closely. 4.Mild thrombocytopenia: Improved 5.  Nausea due to Taxol: I added Aloxi today.  She had nausea for the entire week. 6.  Lack of taste and appetite: I reassured her that in a few weeks, some of her taste will improve.  Return to clinicweekly for Taxol every other week for follow-up with me.

## 2020-02-01 NOTE — Assessment & Plan Note (Signed)
Chronic pain.  Still on gabapentin with L sciatica.  No R lower back pain.  Intermittent pain.  Gabapentin helps.  Hydrocodone used prn, for breast pain and back pain.  It helps both.  She is not using hydrocodone daily.  Would continue gabapentin at baseline with as needed hydrocodone.

## 2020-02-01 NOTE — Assessment & Plan Note (Signed)
Last A1c >8.  On metformin BID w/o ADE on med.  Sugar has been about ~130-170s at home, higher after chemo tx.  No lows.  Some tingling in feet, likely related to chemo.  The goal is for her to get through her chemo treatment in the next 2 months and then we can recheck her sugar after that and go from there.  We talked about gradually addressing health maintenance items related to diabetes as we go along.  It would likely be overwhelming to have her try to address all of those right now.  I did ask her to talk with the oncology clinic about routine vaccination.

## 2020-02-02 ENCOUNTER — Telehealth: Payer: Self-pay | Admitting: *Deleted

## 2020-02-02 NOTE — Telephone Encounter (Signed)
PA submitted thru CMM for Hydrocodone, favorable outcome resulted.

## 2020-02-03 ENCOUNTER — Telehealth: Payer: Self-pay | Admitting: Hematology and Oncology

## 2020-02-03 NOTE — Telephone Encounter (Signed)
Scheduled per 10/26 los. Pt will receive an updated appt calendar per next visit appt notes  

## 2020-02-07 ENCOUNTER — Inpatient Hospital Stay: Payer: Medicaid Other

## 2020-02-07 ENCOUNTER — Other Ambulatory Visit: Payer: Self-pay

## 2020-02-07 ENCOUNTER — Inpatient Hospital Stay: Payer: Medicaid Other | Attending: Hematology and Oncology

## 2020-02-07 VITALS — BP 119/64 | HR 82 | Temp 98.0°F | Resp 18 | Wt 154.4 lb

## 2020-02-07 DIAGNOSIS — C50412 Malignant neoplasm of upper-outer quadrant of left female breast: Secondary | ICD-10-CM | POA: Insufficient documentation

## 2020-02-07 DIAGNOSIS — R630 Anorexia: Secondary | ICD-10-CM | POA: Insufficient documentation

## 2020-02-07 DIAGNOSIS — D696 Thrombocytopenia, unspecified: Secondary | ICD-10-CM | POA: Insufficient documentation

## 2020-02-07 DIAGNOSIS — Z170 Estrogen receptor positive status [ER+]: Secondary | ICD-10-CM

## 2020-02-07 DIAGNOSIS — Z79899 Other long term (current) drug therapy: Secondary | ICD-10-CM | POA: Insufficient documentation

## 2020-02-07 DIAGNOSIS — R739 Hyperglycemia, unspecified: Secondary | ICD-10-CM | POA: Diagnosis not present

## 2020-02-07 DIAGNOSIS — Z95828 Presence of other vascular implants and grafts: Secondary | ICD-10-CM

## 2020-02-07 DIAGNOSIS — Z5111 Encounter for antineoplastic chemotherapy: Secondary | ICD-10-CM | POA: Insufficient documentation

## 2020-02-07 DIAGNOSIS — R948 Abnormal results of function studies of other organs and systems: Secondary | ICD-10-CM | POA: Diagnosis not present

## 2020-02-07 LAB — CMP (CANCER CENTER ONLY)
ALT: 47 U/L — ABNORMAL HIGH (ref 0–44)
AST: 29 U/L (ref 15–41)
Albumin: 3.7 g/dL (ref 3.5–5.0)
Alkaline Phosphatase: 122 U/L (ref 38–126)
Anion gap: 10 (ref 5–15)
BUN: 9 mg/dL (ref 6–20)
CO2: 23 mmol/L (ref 22–32)
Calcium: 9.3 mg/dL (ref 8.9–10.3)
Chloride: 105 mmol/L (ref 98–111)
Creatinine: 0.67 mg/dL (ref 0.44–1.00)
GFR, Estimated: >60 mL/min (ref >60)
Glucose, Bld: 95 mg/dL (ref 70–99)
Potassium: 3.8 mmol/L (ref 3.5–5.1)
Sodium: 138 mmol/L (ref 135–145)
Total Bilirubin: 0.4 mg/dL (ref 0.3–1.2)
Total Protein: 6.6 g/dL (ref 6.5–8.1)

## 2020-02-07 LAB — CBC WITH DIFFERENTIAL/PLATELET
Abs Immature Granulocytes: 0.04 10*3/uL (ref 0.00–0.07)
Basophils Absolute: 0.1 10*3/uL (ref 0.0–0.1)
Basophils Relative: 1 %
Eosinophils Absolute: 0.4 10*3/uL (ref 0.0–0.5)
Eosinophils Relative: 5 %
HCT: 37.2 % (ref 36.0–46.0)
Hemoglobin: 13 g/dL (ref 12.0–15.0)
Immature Granulocytes: 1 %
Lymphocytes Relative: 22 %
Lymphs Abs: 1.6 10*3/uL (ref 0.7–4.0)
MCH: 33.5 pg (ref 26.0–34.0)
MCHC: 34.9 g/dL (ref 30.0–36.0)
MCV: 95.9 fL (ref 80.0–100.0)
Monocytes Absolute: 0.5 10*3/uL (ref 0.1–1.0)
Monocytes Relative: 7 %
Neutro Abs: 4.6 10*3/uL (ref 1.7–7.7)
Neutrophils Relative %: 64 %
Platelets: 188 10*3/uL (ref 150–400)
RBC: 3.88 MIL/uL (ref 3.87–5.11)
RDW: 13.9 % (ref 11.5–15.5)
WBC: 7.1 10*3/uL (ref 4.0–10.5)
nRBC: 0 % (ref 0.0–0.2)

## 2020-02-07 MED ORDER — SODIUM CHLORIDE 0.9% FLUSH
10.0000 mL | INTRAVENOUS | Status: DC | PRN
  Administered 2020-02-07: 10 mL
  Filled 2020-02-07: qty 10

## 2020-02-07 MED ORDER — SODIUM CHLORIDE 0.9 % IV SOLN
Freq: Once | INTRAVENOUS | Status: AC
  Filled 2020-02-07: qty 250

## 2020-02-07 MED ORDER — SODIUM CHLORIDE 0.9 % IV SOLN
80.0000 mg/m2 | Freq: Once | INTRAVENOUS | Status: AC
  Administered 2020-02-07: 144 mg via INTRAVENOUS
  Filled 2020-02-07: qty 24

## 2020-02-07 MED ORDER — SODIUM CHLORIDE 0.9% FLUSH
10.0000 mL | Freq: Once | INTRAVENOUS | Status: AC
  Administered 2020-02-07: 10 mL
  Filled 2020-02-07: qty 10

## 2020-02-07 MED ORDER — HEPARIN SOD (PORK) LOCK FLUSH 100 UNIT/ML IV SOLN
500.0000 [IU] | Freq: Once | INTRAVENOUS | Status: AC | PRN
  Administered 2020-02-07: 500 [IU]
  Filled 2020-02-07: qty 5

## 2020-02-07 MED ORDER — FAMOTIDINE IN NACL 20-0.9 MG/50ML-% IV SOLN
20.0000 mg | Freq: Once | INTRAVENOUS | Status: AC
  Administered 2020-02-07: 20 mg via INTRAVENOUS

## 2020-02-07 MED ORDER — DIPHENHYDRAMINE HCL 50 MG/ML IJ SOLN
INTRAMUSCULAR | Status: AC
  Filled 2020-02-07: qty 1

## 2020-02-07 MED ORDER — PALONOSETRON HCL INJECTION 0.25 MG/5ML
INTRAVENOUS | Status: AC
  Filled 2020-02-07: qty 5

## 2020-02-07 MED ORDER — DIPHENHYDRAMINE HCL 50 MG/ML IJ SOLN
25.0000 mg | Freq: Once | INTRAMUSCULAR | Status: AC
  Administered 2020-02-07: 25 mg via INTRAVENOUS

## 2020-02-07 MED ORDER — PALONOSETRON HCL INJECTION 0.25 MG/5ML
0.2500 mg | Freq: Once | INTRAVENOUS | Status: AC
  Administered 2020-02-07: 0.25 mg via INTRAVENOUS

## 2020-02-07 MED ORDER — FAMOTIDINE IN NACL 20-0.9 MG/50ML-% IV SOLN
INTRAVENOUS | Status: AC
  Filled 2020-02-07: qty 50

## 2020-02-07 NOTE — Patient Instructions (Signed)

## 2020-02-07 NOTE — Patient Instructions (Signed)
[facility] Discharge Instructions for Patients Receiving Chemotherapy  Today you received the following chemotherapy agents:  Taxol.  To help prevent nausea and vomiting after your treatment, we encourage you to take your nausea medication as directed.   If you develop nausea and vomiting that is not controlled by your nausea medication, call the clinic.   BELOW ARE SYMPTOMS THAT SHOULD BE REPORTED IMMEDIATELY:  *FEVER GREATER THAN 100.5 F  *CHILLS WITH OR WITHOUT FEVER  NAUSEA AND VOMITING THAT IS NOT CONTROLLED WITH YOUR NAUSEA MEDICATION  *UNUSUAL SHORTNESS OF BREATH  *UNUSUAL BRUISING OR BLEEDING  TENDERNESS IN MOUTH AND THROAT WITH OR WITHOUT PRESENCE OF ULCERS  *URINARY PROBLEMS  *BOWEL PROBLEMS  UNUSUAL RASH Items with * indicate a potential emergency and should be followed up as soon as possible.  Feel free to call the clinic should you have any questions or concerns. The clinic phone number is [phone].  Please show the CHEMO ALERT CARD at check-in to the Emergency Department and triage nurse.   

## 2020-02-13 NOTE — Progress Notes (Signed)
Patient Care Team: Tonia Ghent, MD as PCP - General (Family Medicine) [name], RN as Registered Nurse [name], RN as Registered Nurse [name], RN as Oncology Nurse Navigator [name], RN as Oncology Nurse Navigator  DIAGNOSIS:    ICD-10-CM   1. Malignant neoplasm of upper-outer quadrant of left breast in female, estrogen receptor positive ([place])  C50.412    Z17.0     SUMMARY OF ONCOLOGIC HISTORY: Oncology History  Malignant neoplasm of upper-outer quadrant of left breast in female, estrogen receptor positive ([place])  08/25/2019 Initial Diagnosis   Palpable left breast mass x1 month. Diagnostic mammogram and [place] on 08/18/19 showed a 2.8cm mass at the 2 o'clock position, and one lymph node with mild cortical thickening in the left axilla. 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   10/18/2019 Cancer Staging   Staging form: Breast, AJCC 8th Edition - Pathologic stage from 10/18/2019: Stage IB (pT2, pN0(sn), cM0, G3, ER+, PR+, HER2-) - Signed by Nicholas Lose, MD on 10/18/2019   10/21/2019 Oncotype testing   32/20%   11/15/2019 -  Chemotherapy   The patient had dexamethasone (DECADRON) 4 MG tablet, 4 mg (100 % of original dose 4 mg), , , 1 of 1 cycle, Start date: 12/30/2019, End date: 01/09/2020 Dose modification: 4 mg (original dose 4 mg, Cycle 0) DOXOrubicin (ADRIAMYCIN) chemo injection 90 mg, 50 mg/m2 = 90 mg (83.3 % of original dose 60 mg/m2), Intravenous,  Once, 4 of 4 cycles Dose modification: 50 mg/m2 (original dose 60 mg/m2, Cycle 1, Reason: Provider Judgment) Administration: 90 mg (11/15/2019), 90 mg (11/29/2019), 90 mg (12/13/2019), 90 mg (12/27/2019) palonosetron (ALOXI)  injection 0.25 mg, 0.25 mg, Intravenous,  Once, 8 of 15 cycles Administration: 0.25 mg (11/15/2019), 0.25 mg (11/29/2019), 0.25 mg (12/13/2019), 0.25 mg (12/27/2019), 0.25 mg (01/17/2020), 0.25 mg (01/24/2020), 0.25 mg (01/31/2020), 0.25 mg (02/07/2020) pegfilgrastim-jmdb (FULPHILA) injection 6 mg, 6 mg, Subcutaneous,  Once, 4 of 4 cycles Administration: 6 mg (11/17/2019), 6 mg (12/01/2019), 6 mg (12/15/2019), 6 mg (12/29/2019) cyclophosphamide (CYTOXAN) 900 mg in sodium chloride 0.9 % 250 mL chemo infusion, 500 mg/m2 = 900 mg (83.3 % of original dose 600 mg/m2), Intravenous,  Once, 4 of 4 cycles Dose modification: 500 mg/m2 (original dose 600 mg/m2, Cycle 1, Reason: Provider Judgment) Administration: 900 mg (11/15/2019), 900 mg (11/29/2019), 900 mg (12/13/2019), 900 mg (12/27/2019) PACLitaxel (TAXOL) 144 mg in sodium chloride 0.9 % 250 mL chemo infusion (</= 26m/m2), 80 mg/m2 = 144 mg, Intravenous,  Once, 5 of 12 cycles Administration: 144 mg (01/09/2020), 144 mg (01/17/2020), 144 mg (01/24/2020), 144 mg (01/31/2020), 144 mg (02/07/2020) fosaprepitant (EMEND) 150 mg in sodium chloride 0.9 % 145 mL IVPB, 150 mg, Intravenous,  Once, 4 of 4 cycles Administration: 150 mg (11/15/2019), 150 mg (11/29/2019), 150 mg (12/13/2019), 150 mg (12/27/2019)  for chemotherapy treatment.      CHIEF COMPLIANT: Cycle6Taxol  INTERVAL HISTORY: Julia ALESHIREis a 54y.o. with above-mentioned history of left breast cancer treated with lumpectomy andwho is currently on adjuvantchemotherapy withweekly Taxol after completing 4 cycles ofdose dense Adriamycin and Cytoxan. She presents to the clinic today for a toxicity checkandcycle6. Her brother passed away last week from  unknown cause and they are investigating the cause of his death.  She is extremely sad about his loss.  There were very close.  She has not had any neuropathy symptoms.  Denies any nausea or vomiting.  She does have mild to moderate fatigue.  ALLERGIES:  is allergic  to propoxyphene n-acetaminophen, nabumetone, and rofecoxib.  MEDICATIONS:  Current Outpatient Medications  Medication Sig Dispense Refill  . albuterol (PROVENTIL) (2.5 MG/3ML) 0.083% nebulizer solution Take 3 mLs (2.5 mg total) by nebulization every 6 (six) hours as needed for wheezing or shortness of breath. 75 mL 1  . albuterol (VENTOLIN HFA) 108 (90 Base) MCG/ACT inhaler Inhale 2 puffs into the lungs every 6 (six) hours as needed for wheezing or shortness of breath. 18 g 3  . blood glucose meter kit and supplies KIT Dispense based on patient and insurance preference. Use up to four times daily as directed. (FOR ICD-9 250.00, 250.01). 1 each 0  . gabapentin (NEURONTIN) 300 MG capsule Take up to 4 tabs a day (2 tabs twice a day) 120 capsule 3  . HYDROcodone-acetaminophen (NORCO) 5-325 MG tablet Take 1-2 tablets by mouth every 6 (six) hours as needed for severe pain. Sedation caution 30 tablet 0  . ibuprofen (ADVIL) 600 MG tablet Take 1 tablet (600 mg total) by mouth every 8 (eight) hours as needed (For pain.  Take with food.). 90 tablet 2  . metFORMIN (GLUCOPHAGE) 500 MG tablet Take 1 tablet (500 mg total) by mouth 2 (two) times daily with a meal. 60 tablet 3  . omeprazole (PRILOSEC OTC) 20 MG tablet Take 20 mg by mouth daily.    . ondansetron (ZOFRAN) 8 MG tablet DISSOLVE 1TAB ON THE TONGUE UP TO TWICE A DAY AS NEEDED**START ON THE THIRD DAY AFTER CHEMOTHERAPY 30 tablet 1  . prochlorperazine (COMPAZINE) 10 MG tablet Take 1 tablet (10 mg total) by mouth every 6 (six) hours as needed (Nausea or vomiting). 30 tablet 1   No current facility-administered medications for this visit.    PHYSICAL EXAMINATION: ECOG PERFORMANCE STATUS: 1 - Symptomatic but completely ambulatory  There were no vitals filed for this visit. There were no vitals filed for this visit.  LABORATORY DATA:  I have reviewed the data as listed CMP Latest Ref Rng & Units 02/07/2020 01/31/2020 01/24/2020  Glucose 70 - 99 mg/dL  95 207(H) 101(H)  BUN 6 - 20 mg/dL '9 8 8  ' Creatinine 0.44 - 1.00 mg/dL 0.67 0.66 0.66  Sodium 135 - 145 mmol/L 138 139 136  Potassium 3.5 - 5.1 mmol/L 3.8 3.5 3.7  Chloride 98 - 111 mmol/L 105 109 102  CO2 22 - 32 mmol/L '23 22 26  ' Calcium 8.9 - 10.3 mg/dL 9.3 9.3 9.6  Total Protein 6.5 - 8.1 g/dL 6.6 6.0(L) 6.8  Total Bilirubin 0.3 - 1.2 mg/dL 0.4 0.4 0.5  Alkaline Phos 38 - 126 U/L 122 117 143(H)  AST 15 - 41 U/L '29 26 30  ' ALT 0 - 44 U/L 47(H) 44 57(H)    Lab Results  Component Value Date   WBC 7.1 02/07/2020   HGB 13.0 02/07/2020   HCT 37.2 02/07/2020   MCV 95.9 02/07/2020   PLT 188 02/07/2020   NEUTROABS 4.6 02/07/2020    ASSESSMENT & PLAN:  Malignant neoplasm of upper-outer quadrant of left breast in female, estrogen receptor positive ([place]) 10/07/2019:Left lumpectomy and sentinel lymph node biopsy [name]): IDC with DCIS, 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ER 90%, PR  90%, HER-2 negative by FISH Posterior margin positive but no further surgery done because it is muscle on the back. T2N0 stage Ib Oncotype DX score 32: 20% risk of distant recurrence in 9 years  Treatment plan:  1. Adjuvant chemotherapy with dose dense Adriamycin and Cytoxan x4 followed by Taxol weekly x12 2. followed by radiation therapy  3. Followed by adjuvant antiestrogen therapy. ------------------------------------------------------------------------------------------------------------------------------------------------- Echocardiogram August 2021: EF 55 to 60% Current treatment:Completed 4 cycles ofdose dense Adriamycin and Cytoxan, today is cycle6Taxol  Chemo toxicities: 1.Elevated blood sugars: still elevated 2.hair loss:Secondary to chemo 3.Elevated LFTs:Probably secondary to fatty liver. better today 4.Mild thrombocytopenia:Improved 5.Lack of taste and appetite: Slight improvement  Her brother passed away last week from suspicious reason.  Police were  investigating the cause of his death.  She is extremely devastated about this.  He is getting cremated tomorrow.  Return to clinicweekly for Taxol every other week for follow-up with me.    No orders of the defined types were placed in this encounter.  The patient has a good understanding of the overall plan. she agrees with it. she will call with any problems that may develop before the next visit here.  Total time spent: 30 mins including face to face time and time spent for planning, charting and coordination of care  Nicholas Lose, MD 02/14/2020  I, Cloyde Reams Dorshimer, am acting as scribe for Dr. Nicholas Lose.  I have reviewed the above documentation for accuracy and completeness, and I agree with the above.

## 2020-02-14 ENCOUNTER — Inpatient Hospital Stay: Payer: Medicaid Other

## 2020-02-14 ENCOUNTER — Inpatient Hospital Stay (HOSPITAL_BASED_OUTPATIENT_CLINIC_OR_DEPARTMENT_OTHER): Payer: Medicaid Other | Admitting: Hematology and Oncology

## 2020-02-14 ENCOUNTER — Other Ambulatory Visit: Payer: Self-pay

## 2020-02-14 ENCOUNTER — Encounter: Payer: Self-pay | Admitting: *Deleted

## 2020-02-14 DIAGNOSIS — C50412 Malignant neoplasm of upper-outer quadrant of left female breast: Secondary | ICD-10-CM

## 2020-02-14 DIAGNOSIS — R948 Abnormal results of function studies of other organs and systems: Secondary | ICD-10-CM | POA: Diagnosis not present

## 2020-02-14 DIAGNOSIS — D696 Thrombocytopenia, unspecified: Secondary | ICD-10-CM | POA: Diagnosis not present

## 2020-02-14 DIAGNOSIS — Z95828 Presence of other vascular implants and grafts: Secondary | ICD-10-CM

## 2020-02-14 DIAGNOSIS — Z170 Estrogen receptor positive status [ER+]: Secondary | ICD-10-CM

## 2020-02-14 DIAGNOSIS — Z5111 Encounter for antineoplastic chemotherapy: Secondary | ICD-10-CM | POA: Diagnosis not present

## 2020-02-14 DIAGNOSIS — Z79899 Other long term (current) drug therapy: Secondary | ICD-10-CM | POA: Diagnosis not present

## 2020-02-14 DIAGNOSIS — R630 Anorexia: Secondary | ICD-10-CM | POA: Diagnosis not present

## 2020-02-14 DIAGNOSIS — R739 Hyperglycemia, unspecified: Secondary | ICD-10-CM | POA: Diagnosis not present

## 2020-02-14 LAB — CMP (CANCER CENTER ONLY)
ALT: 52 U/L — ABNORMAL HIGH (ref 0–44)
AST: 34 U/L (ref 15–41)
Albumin: 3.9 g/dL (ref 3.5–5.0)
Alkaline Phosphatase: 124 U/L (ref 38–126)
Anion gap: 10 (ref 5–15)
BUN: 7 mg/dL (ref 6–20)
CO2: 27 mmol/L (ref 22–32)
Calcium: 9.6 mg/dL (ref 8.9–10.3)
Chloride: 102 mmol/L (ref 98–111)
Creatinine: 0.69 mg/dL (ref 0.44–1.00)
GFR, Estimated: >60 mL/min (ref >60)
Glucose, Bld: 109 mg/dL — ABNORMAL HIGH (ref 70–99)
Potassium: 4 mmol/L (ref 3.5–5.1)
Sodium: 139 mmol/L (ref 135–145)
Total Bilirubin: 0.6 mg/dL (ref 0.3–1.2)
Total Protein: 7.1 g/dL (ref 6.5–8.1)

## 2020-02-14 LAB — CBC WITH DIFFERENTIAL (CANCER CENTER ONLY)
Abs Immature Granulocytes: 0.04 10*3/uL (ref 0.00–0.07)
Basophils Absolute: 0.1 10*3/uL (ref 0.0–0.1)
Basophils Relative: 1 %
Eosinophils Absolute: 0.3 10*3/uL (ref 0.0–0.5)
Eosinophils Relative: 4 %
HCT: 40.2 % (ref 36.0–46.0)
Hemoglobin: 14 g/dL (ref 12.0–15.0)
Immature Granulocytes: 1 %
Lymphocytes Relative: 23 %
Lymphs Abs: 1.7 10*3/uL (ref 0.7–4.0)
MCH: 33.5 pg (ref 26.0–34.0)
MCHC: 34.8 g/dL (ref 30.0–36.0)
MCV: 96.2 fL (ref 80.0–100.0)
Monocytes Absolute: 0.5 10*3/uL (ref 0.1–1.0)
Monocytes Relative: 7 %
Neutro Abs: 4.8 10*3/uL (ref 1.7–7.7)
Neutrophils Relative %: 64 %
Platelet Count: 224 10*3/uL (ref 150–400)
RBC: 4.18 MIL/uL (ref 3.87–5.11)
RDW: 13.5 % (ref 11.5–15.5)
WBC Count: 7.4 10*3/uL (ref 4.0–10.5)
nRBC: 0 % (ref 0.0–0.2)

## 2020-02-14 MED ORDER — SODIUM CHLORIDE 0.9% FLUSH
10.0000 mL | Freq: Once | INTRAVENOUS | Status: AC
  Administered 2020-02-14: 10 mL
  Filled 2020-02-14: qty 10

## 2020-02-14 MED ORDER — FAMOTIDINE IN NACL 20-0.9 MG/50ML-% IV SOLN
INTRAVENOUS | Status: AC
  Filled 2020-02-14: qty 50

## 2020-02-14 MED ORDER — PALONOSETRON HCL INJECTION 0.25 MG/5ML
INTRAVENOUS | Status: AC
  Filled 2020-02-14: qty 5

## 2020-02-14 MED ORDER — FAMOTIDINE IN NACL 20-0.9 MG/50ML-% IV SOLN
20.0000 mg | Freq: Once | INTRAVENOUS | Status: AC
  Administered 2020-02-14: 20 mg via INTRAVENOUS

## 2020-02-14 MED ORDER — SODIUM CHLORIDE 0.9 % IV SOLN
80.0000 mg/m2 | Freq: Once | INTRAVENOUS | Status: AC
  Administered 2020-02-14: 144 mg via INTRAVENOUS
  Filled 2020-02-14: qty 24

## 2020-02-14 MED ORDER — DIPHENHYDRAMINE HCL 50 MG/ML IJ SOLN
25.0000 mg | Freq: Once | INTRAMUSCULAR | Status: AC
  Administered 2020-02-14: 25 mg via INTRAVENOUS

## 2020-02-14 MED ORDER — HEPARIN SOD (PORK) LOCK FLUSH 100 UNIT/ML IV SOLN
500.0000 [IU] | Freq: Once | INTRAVENOUS | Status: DC | PRN
  Filled 2020-02-14: qty 5

## 2020-02-14 MED ORDER — PALONOSETRON HCL INJECTION 0.25 MG/5ML
0.2500 mg | Freq: Once | INTRAVENOUS | Status: AC
  Administered 2020-02-14: 0.25 mg via INTRAVENOUS

## 2020-02-14 MED ORDER — SODIUM CHLORIDE 0.9% FLUSH
10.0000 mL | INTRAVENOUS | Status: DC | PRN
  Administered 2020-02-14: 10 mL
  Filled 2020-02-14: qty 10

## 2020-02-14 MED ORDER — DIPHENHYDRAMINE HCL 50 MG/ML IJ SOLN
INTRAMUSCULAR | Status: AC
  Filled 2020-02-14: qty 1

## 2020-02-14 MED ORDER — SODIUM CHLORIDE 0.9 % IV SOLN
Freq: Once | INTRAVENOUS | Status: AC
  Filled 2020-02-14: qty 250

## 2020-02-14 NOTE — Patient Instructions (Signed)

## 2020-02-14 NOTE — Assessment & Plan Note (Signed)
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 T2N0 stage Ib Oncotype DX score 32: 20% risk of distant recurrence in 9 years  Treatment plan:  1. Adjuvant chemotherapy with dose dense Adriamycin and Cytoxan x4 followed by Taxol weekly x12 2. followed by radiation therapy  3. Followed by adjuvant antiestrogen therapy. ------------------------------------------------------------------------------------------------------------------------------------------------- Echocardiogram August 2021: EF 55 to 60% Current treatment:Completed 4 cycles ofdose dense Adriamycin and Cytoxan, today is cycle6Taxol  Chemo toxicities: 1.Elevated blood sugars: still elevated 2.hair loss:Secondary to chemo 3.Elevated LFTs:Probably secondary to fatty liver. better today 4.Mild thrombocytopenia:Improved 5.Nausea due to Taxol: I added Aloxi today. Improved 6.Lack of taste and appetite: I reassured her that in a few weeks, some of her taste will improve.  Return to clinicweekly for Taxol every other week for follow-up with me.

## 2020-02-14 NOTE — Patient Instructions (Signed)
[facility] Discharge Instructions for Patients Receiving Chemotherapy  Today you received the following chemotherapy agents:  Taxol.  To help prevent nausea and vomiting after your treatment, we encourage you to take your nausea medication as directed.   If you develop nausea and vomiting that is not controlled by your nausea medication, call the clinic.   BELOW ARE SYMPTOMS THAT SHOULD BE REPORTED IMMEDIATELY:  *FEVER GREATER THAN 100.5 F  *CHILLS WITH OR WITHOUT FEVER  NAUSEA AND VOMITING THAT IS NOT CONTROLLED WITH YOUR NAUSEA MEDICATION  *UNUSUAL SHORTNESS OF BREATH  *UNUSUAL BRUISING OR BLEEDING  TENDERNESS IN MOUTH AND THROAT WITH OR WITHOUT PRESENCE OF ULCERS  *URINARY PROBLEMS  *BOWEL PROBLEMS  UNUSUAL RASH Items with * indicate a potential emergency and should be followed up as soon as possible.  Feel free to call the clinic should you have any questions or concerns. The clinic phone number is [phone].  Please show the CHEMO ALERT CARD at check-in to the Emergency Department and triage nurse.   

## 2020-02-16 ENCOUNTER — Telehealth: Payer: Self-pay | Admitting: Hematology and Oncology

## 2020-02-16 NOTE — Telephone Encounter (Signed)
Scheduled per 11/9 los. Pt will receive an updated appt calendar per next appt notes

## 2020-02-21 ENCOUNTER — Other Ambulatory Visit: Payer: Self-pay | Admitting: Family Medicine

## 2020-02-21 ENCOUNTER — Other Ambulatory Visit: Payer: Self-pay

## 2020-02-21 ENCOUNTER — Inpatient Hospital Stay: Payer: Medicaid Other

## 2020-02-21 VITALS — BP 128/68 | HR 82 | Temp 98.2°F | Resp 18

## 2020-02-21 DIAGNOSIS — Z170 Estrogen receptor positive status [ER+]: Secondary | ICD-10-CM

## 2020-02-21 DIAGNOSIS — D696 Thrombocytopenia, unspecified: Secondary | ICD-10-CM | POA: Diagnosis not present

## 2020-02-21 DIAGNOSIS — Z95828 Presence of other vascular implants and grafts: Secondary | ICD-10-CM

## 2020-02-21 DIAGNOSIS — Z79899 Other long term (current) drug therapy: Secondary | ICD-10-CM | POA: Diagnosis not present

## 2020-02-21 DIAGNOSIS — R630 Anorexia: Secondary | ICD-10-CM | POA: Diagnosis not present

## 2020-02-21 DIAGNOSIS — R948 Abnormal results of function studies of other organs and systems: Secondary | ICD-10-CM | POA: Diagnosis not present

## 2020-02-21 DIAGNOSIS — C50412 Malignant neoplasm of upper-outer quadrant of left female breast: Secondary | ICD-10-CM | POA: Diagnosis not present

## 2020-02-21 DIAGNOSIS — R739 Hyperglycemia, unspecified: Secondary | ICD-10-CM | POA: Diagnosis not present

## 2020-02-21 DIAGNOSIS — Z5111 Encounter for antineoplastic chemotherapy: Secondary | ICD-10-CM | POA: Diagnosis not present

## 2020-02-21 LAB — CBC WITH DIFFERENTIAL (CANCER CENTER ONLY)
Abs Immature Granulocytes: 0.04 10*3/uL (ref 0.00–0.07)
Basophils Absolute: 0.1 10*3/uL (ref 0.0–0.1)
Basophils Relative: 1 %
Eosinophils Absolute: 0.2 10*3/uL (ref 0.0–0.5)
Eosinophils Relative: 3 %
HCT: 39 % (ref 36.0–46.0)
Hemoglobin: 13.5 g/dL (ref 12.0–15.0)
Immature Granulocytes: 1 %
Lymphocytes Relative: 22 %
Lymphs Abs: 1.5 10*3/uL (ref 0.7–4.0)
MCH: 33.6 pg (ref 26.0–34.0)
MCHC: 34.6 g/dL (ref 30.0–36.0)
MCV: 97 fL (ref 80.0–100.0)
Monocytes Absolute: 0.5 10*3/uL (ref 0.1–1.0)
Monocytes Relative: 7 %
Neutro Abs: 4.6 10*3/uL (ref 1.7–7.7)
Neutrophils Relative %: 66 %
Platelet Count: 225 10*3/uL (ref 150–400)
RBC: 4.02 MIL/uL (ref 3.87–5.11)
RDW: 13.3 % (ref 11.5–15.5)
WBC Count: 6.9 10*3/uL (ref 4.0–10.5)
nRBC: 0 % (ref 0.0–0.2)

## 2020-02-21 LAB — CMP (CANCER CENTER ONLY)
ALT: 52 U/L — ABNORMAL HIGH (ref 0–44)
AST: 40 U/L (ref 15–41)
Albumin: 3.9 g/dL (ref 3.5–5.0)
Alkaline Phosphatase: 113 U/L (ref 38–126)
Anion gap: 9 (ref 5–15)
BUN: 7 mg/dL (ref 6–20)
CO2: 25 mmol/L (ref 22–32)
Calcium: 9.6 mg/dL (ref 8.9–10.3)
Chloride: 106 mmol/L (ref 98–111)
Creatinine: 0.68 mg/dL (ref 0.44–1.00)
GFR, Estimated: >60 mL/min (ref >60)
Glucose, Bld: 132 mg/dL — ABNORMAL HIGH (ref 70–99)
Potassium: 3.9 mmol/L (ref 3.5–5.1)
Sodium: 140 mmol/L (ref 135–145)
Total Bilirubin: 0.4 mg/dL (ref 0.3–1.2)
Total Protein: 6.9 g/dL (ref 6.5–8.1)

## 2020-02-21 MED ORDER — SODIUM CHLORIDE 0.9% FLUSH
10.0000 mL | INTRAVENOUS | Status: DC | PRN
  Filled 2020-02-21: qty 10

## 2020-02-21 MED ORDER — FAMOTIDINE IN NACL 20-0.9 MG/50ML-% IV SOLN
20.0000 mg | Freq: Once | INTRAVENOUS | Status: AC
  Administered 2020-02-21: 20 mg via INTRAVENOUS

## 2020-02-21 MED ORDER — SODIUM CHLORIDE 0.9 % IV SOLN
80.0000 mg/m2 | Freq: Once | INTRAVENOUS | Status: AC
  Administered 2020-02-21: 144 mg via INTRAVENOUS
  Filled 2020-02-21: qty 24

## 2020-02-21 MED ORDER — SODIUM CHLORIDE 0.9% FLUSH
10.0000 mL | Freq: Once | INTRAVENOUS | Status: AC
  Administered 2020-02-21: 10 mL
  Filled 2020-02-21: qty 10

## 2020-02-21 MED ORDER — HEPARIN SOD (PORK) LOCK FLUSH 100 UNIT/ML IV SOLN
500.0000 [IU] | Freq: Once | INTRAVENOUS | Status: DC | PRN
  Filled 2020-02-21: qty 5

## 2020-02-21 MED ORDER — PALONOSETRON HCL INJECTION 0.25 MG/5ML
0.2500 mg | Freq: Once | INTRAVENOUS | Status: AC
  Administered 2020-02-21: 0.25 mg via INTRAVENOUS

## 2020-02-21 MED ORDER — DIPHENHYDRAMINE HCL 50 MG/ML IJ SOLN
25.0000 mg | Freq: Once | INTRAMUSCULAR | Status: AC
  Administered 2020-02-21: 25 mg via INTRAVENOUS

## 2020-02-21 MED ORDER — FAMOTIDINE IN NACL 20-0.9 MG/50ML-% IV SOLN
INTRAVENOUS | Status: AC
  Filled 2020-02-21: qty 50

## 2020-02-21 MED ORDER — PALONOSETRON HCL INJECTION 0.25 MG/5ML
INTRAVENOUS | Status: AC
  Filled 2020-02-21: qty 5

## 2020-02-21 MED ORDER — DIPHENHYDRAMINE HCL 50 MG/ML IJ SOLN
INTRAMUSCULAR | Status: AC
  Filled 2020-02-21: qty 1

## 2020-02-21 MED ORDER — SODIUM CHLORIDE 0.9 % IV SOLN
Freq: Once | INTRAVENOUS | Status: AC
  Filled 2020-02-21: qty 250

## 2020-02-21 NOTE — Patient Instructions (Signed)
[facility] Discharge Instructions for Patients Receiving Chemotherapy  Today you received the following chemotherapy agents:  Taxol.  To help prevent nausea and vomiting after your treatment, we encourage you to take your nausea medication as directed.   If you develop nausea and vomiting that is not controlled by your nausea medication, call the clinic.   BELOW ARE SYMPTOMS THAT SHOULD BE REPORTED IMMEDIATELY:  *FEVER GREATER THAN 100.5 F  *CHILLS WITH OR WITHOUT FEVER  NAUSEA AND VOMITING THAT IS NOT CONTROLLED WITH YOUR NAUSEA MEDICATION  *UNUSUAL SHORTNESS OF BREATH  *UNUSUAL BRUISING OR BLEEDING  TENDERNESS IN MOUTH AND THROAT WITH OR WITHOUT PRESENCE OF ULCERS  *URINARY PROBLEMS  *BOWEL PROBLEMS  UNUSUAL RASH Items with * indicate a potential emergency and should be followed up as soon as possible.  Feel free to call the clinic should you have any questions or concerns. The clinic phone number is [phone].  Please show the CHEMO ALERT CARD at check-in to the Emergency Department and triage nurse.   

## 2020-02-22 MED ORDER — HYDROCODONE-ACETAMINOPHEN 5-325 MG PO TABS
1.0000 | ORAL_TABLET | Freq: Four times a day (QID) | ORAL | 0 refills | Status: DC | PRN
Start: 2020-02-22 — End: 2020-03-24

## 2020-02-22 NOTE — Telephone Encounter (Signed)
Noted. Thanks.  Sent.

## 2020-02-22 NOTE — Telephone Encounter (Signed)
Name of Medication: Hydrocodone-Acetaminophen 5-325 mg  Name of Pharmacy: [id number] [place], [place] or Written Date and Quantity: 01/30/2020; 30; R0 Last Office Visit and Type: 01/30/2020 Diabetes Mellitus without complication Next Office Visit and Type: Not Scheduled  Last Controlled Substance Agreement Date: >1 year ago. Patient has a lab appointment on 02/24/2020 at 0945 to complete contract and UDS. Last UDS: (See Above)

## 2020-02-24 ENCOUNTER — Other Ambulatory Visit: Payer: Self-pay

## 2020-02-24 ENCOUNTER — Other Ambulatory Visit (INDEPENDENT_AMBULATORY_CARE_PROVIDER_SITE_OTHER): Payer: Medicaid Other

## 2020-02-24 DIAGNOSIS — M5432 Sciatica, left side: Secondary | ICD-10-CM | POA: Diagnosis not present

## 2020-02-25 LAB — DRUG MONITORING, PANEL 8 WITH CONFIRMATION, URINE
6 Acetylmorphine: NEGATIVE ng/mL (ref <10)
Alcohol Metabolites: NEGATIVE ng/mL
Amphetamines: NEGATIVE ng/mL (ref <500)
Benzodiazepines: NEGATIVE ng/mL (ref <100)
Buprenorphine, Urine: NEGATIVE ng/mL (ref <5)
Cocaine Metabolite: NEGATIVE ng/mL (ref <150)
Creatinine: 65.8 mg/dL
MDMA: NEGATIVE ng/mL (ref <500)
Marijuana Metabolite: NEGATIVE ng/mL (ref <20)
Opiates: NEGATIVE ng/mL (ref <100)
Oxidant: NEGATIVE ug/mL
Oxycodone: NEGATIVE ng/mL (ref <100)
pH: 7.9 (ref 4.5–9.0)

## 2020-02-25 LAB — DM TEMPLATE

## 2020-02-27 NOTE — Assessment & Plan Note (Signed)
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 T2N0 stage Ib Oncotype DX score 32: 20% risk of distant recurrence in 9 years  Treatment plan:  1. Adjuvant chemotherapy with dose dense Adriamycin and Cytoxan x4 followed by Taxol weekly x12 2. followed by radiation therapy  3. Followed by adjuvant antiestrogen therapy. ------------------------------------------------------------------------------------------------------------------------------------------------- Echocardiogram August 2021: EF 55 to 60% Current treatment:Completed 4 cycles ofdose dense Adriamycin and Cytoxan, today is cycle8Taxol  Chemo toxicities: 1.Elevated blood sugars:still elevated 2.hair loss:Secondary to chemo 3.Elevated LFTs:Probably secondary to fatty liver.better today 4.Mild thrombocytopenia:Improved 5.Lack of taste and appetite: Slight improvement  Her brother passed away last week from suspicious reason.  Police were investigating the cause of his death.  She is extremely devastated about this.  He is getting cremated tomorrow.  Return to clinicweekly for Taxol every other week for follow-up with me.

## 2020-02-27 NOTE — Progress Notes (Signed)
Patient Care Team: Tonia Ghent, MD as PCP - General (Family Medicine) [name], RN as Registered Nurse [name], RN as Registered Nurse [name], RN as Oncology Nurse Navigator [name], RN as Oncology Nurse Navigator  DIAGNOSIS:    ICD-10-CM   1. Malignant neoplasm of upper-outer quadrant of left breast in female, estrogen receptor positive ([place])  C50.412    Z17.0     SUMMARY OF ONCOLOGIC HISTORY: Oncology History  Malignant neoplasm of upper-outer quadrant of left breast in female, estrogen receptor positive ([place])  08/25/2019 Initial Diagnosis   Palpable left breast mass x1 month. Diagnostic mammogram and [place] on 08/18/19 showed a 2.8cm mass at the 2 o'clock position, and one lymph node with mild cortical thickening in the left axilla. 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   10/18/2019 Cancer Staging   Staging form: Breast, AJCC 8th Edition - Pathologic stage from 10/18/2019: Stage IB (pT2, pN0(sn), cM0, G3, ER+, PR+, HER2-) - Signed by Nicholas Lose, MD on 10/18/2019   10/21/2019 Oncotype testing   32/20%   11/15/2019 -  Chemotherapy   The patient had dexamethasone (DECADRON) 4 MG tablet, 4 mg (100 % of original dose 4 mg), , , 1 of 1 cycle, Start date: 12/30/2019, End date: 01/09/2020 Dose modification: 4 mg (original dose 4 mg, Cycle 0) DOXOrubicin (ADRIAMYCIN) chemo injection 90 mg, 50 mg/m2 = 90 mg (83.3 % of original dose 60 mg/m2), Intravenous,  Once, 4 of 4 cycles Dose modification: 50 mg/m2 (original dose 60 mg/m2, Cycle 1, Reason: Provider Judgment) Administration: 90 mg (11/15/2019), 90 mg (11/29/2019), 90 mg (12/13/2019), 90 mg (12/27/2019) palonosetron (ALOXI)  injection 0.25 mg, 0.25 mg, Intravenous,  Once, 10 of 15 cycles Administration: 0.25 mg (11/15/2019), 0.25 mg (11/29/2019), 0.25 mg (12/13/2019), 0.25 mg (12/27/2019), 0.25 mg (01/17/2020), 0.25 mg (01/24/2020), 0.25 mg (01/31/2020), 0.25 mg (02/07/2020), 0.25 mg (02/14/2020), 0.25 mg (02/21/2020) pegfilgrastim-jmdb (FULPHILA) injection 6 mg, 6 mg, Subcutaneous,  Once, 4 of 4 cycles Administration: 6 mg (11/17/2019), 6 mg (12/01/2019), 6 mg (12/15/2019), 6 mg (12/29/2019) cyclophosphamide (CYTOXAN) 900 mg in sodium chloride 0.9 % 250 mL chemo infusion, 500 mg/m2 = 900 mg (83.3 % of original dose 600 mg/m2), Intravenous,  Once, 4 of 4 cycles Dose modification: 500 mg/m2 (original dose 600 mg/m2, Cycle 1, Reason: Provider Judgment) Administration: 900 mg (11/15/2019), 900 mg (11/29/2019), 900 mg (12/13/2019), 900 mg (12/27/2019) PACLitaxel (TAXOL) 144 mg in sodium chloride 0.9 % 250 mL chemo infusion (</= 73m/m2), 80 mg/m2 = 144 mg, Intravenous,  Once, 7 of 12 cycles Administration: 144 mg (01/09/2020), 144 mg (01/17/2020), 144 mg (01/24/2020), 144 mg (01/31/2020), 144 mg (02/07/2020), 144 mg (02/14/2020), 144 mg (02/21/2020) fosaprepitant (EMEND) 150 mg in sodium chloride 0.9 % 145 mL IVPB, 150 mg, Intravenous,  Once, 4 of 4 cycles Administration: 150 mg (11/15/2019), 150 mg (11/29/2019), 150 mg (12/13/2019), 150 mg (12/27/2019)  for chemotherapy treatment.      CHIEF COMPLIANT: Cycle8Taxol  INTERVAL HISTORY: Julia WHITELAWis a 54y.o. with above-mentioned history of left breast cancer treated with lumpectomy andwho is currently on adjuvantchemotherapy withweekly Taxol after completing 4 cycles ofdose dense Adriamycin and Cytoxan. She presents to the clinic  today for a toxicity checkandcycle8. She experienced mild to moderate nausea after last cycle of chemotherapy but the nausea medications worked and she is doing well.  Denies any neuropathy.  Does complain of fatigue.  ALLERGIES:  is allergic to  propoxyphene n-acetaminophen, nabumetone, and rofecoxib.  MEDICATIONS:  Current Outpatient Medications  Medication Sig Dispense Refill  . albuterol (PROVENTIL) (2.5 MG/3ML) 0.083% nebulizer solution Take 3 mLs (2.5 mg total) by nebulization every 6 (six) hours as needed for wheezing or shortness of breath. 75 mL 1  . albuterol (VENTOLIN HFA) 108 (90 Base) MCG/ACT inhaler Inhale 2 puffs into the lungs every 6 (six) hours as needed for wheezing or shortness of breath. 18 g 3  . blood glucose meter kit and supplies KIT Dispense based on patient and insurance preference. Use up to four times daily as directed. (FOR ICD-9 250.00, 250.01). 1 each 0  . gabapentin (NEURONTIN) 300 MG capsule Take up to 4 tabs a day (2 tabs twice a day) 120 capsule 3  . HYDROcodone-acetaminophen (NORCO) 5-325 MG tablet Take 1-2 tablets by mouth every 6 (six) hours as needed for severe pain. Sedation caution 30 tablet 0  . ibuprofen (ADVIL) 600 MG tablet Take 1 tablet (600 mg total) by mouth every 8 (eight) hours as needed (For pain.  Take with food.). 90 tablet 2  . metFORMIN (GLUCOPHAGE) 500 MG tablet Take 1 tablet (500 mg total) by mouth 2 (two) times daily with a meal. 60 tablet 3  . omeprazole (PRILOSEC OTC) 20 MG tablet Take 20 mg by mouth daily.    . ondansetron (ZOFRAN) 8 MG tablet DISSOLVE 1TAB ON THE TONGUE UP TO TWICE A DAY AS NEEDED**START ON THE THIRD DAY AFTER CHEMOTHERAPY 30 tablet 1  . prochlorperazine (COMPAZINE) 10 MG tablet Take 1 tablet (10 mg total) by mouth every 6 (six) hours as needed (Nausea or vomiting). 30 tablet 1   No current facility-administered medications for this visit.    PHYSICAL EXAMINATION: ECOG PERFORMANCE STATUS: 1 - Symptomatic but completely ambulatory  Vitals:   02/28/20 1054  BP: 104/60  Pulse: 83  Resp: 18  Temp: 97.9 F (36.6 C)  SpO2: 97%   Filed Weights   02/28/20 1054  Weight: 151 lb 8 oz (68.7 kg)    LABORATORY DATA:  I have reviewed the data as listed CMP  Latest Ref Rng & Units 02/28/2020 02/21/2020 02/14/2020  Glucose 70 - 99 mg/dL 151(H) 132(H) 109(H)  BUN 6 - 20 mg/dL '8 7 7  ' Creatinine 0.44 - 1.00 mg/dL 0.72 0.68 0.69  Sodium 135 - 145 mmol/L 138 140 139  Potassium 3.5 - 5.1 mmol/L 3.9 3.9 4.0  Chloride 98 - 111 mmol/L 105 106 102  CO2 22 - 32 mmol/L 21(L) 25 27  Calcium 8.9 - 10.3 mg/dL 9.2 9.6 9.6  Total Protein 6.5 - 8.1 g/dL 6.8 6.9 7.1  Total Bilirubin 0.3 - 1.2 mg/dL 0.4 0.4 0.6  Alkaline Phos 38 - 126 U/L 98 113 124  AST 15 - 41 U/L 44(H) 40 34  ALT 0 - 44 U/L 55(H) 52(H) 52(H)    Lab Results  Component Value Date   WBC 6.1 02/28/2020   HGB 13.5 02/28/2020   HCT 39.1 02/28/2020   MCV 96.8 02/28/2020   PLT 219 02/28/2020   NEUTROABS 3.9 02/28/2020    ASSESSMENT & PLAN:  Malignant neoplasm of upper-outer quadrant of left breast in female, estrogen receptor positive ([place]) 10/07/2019:Left lumpectomy and sentinel lymph node biopsy [name]):  IDC with DCIS, 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 T2N0 stage Ib Oncotype DX score 32: 20% risk of distant recurrence in 9 years  Treatment plan:  1. Adjuvant chemotherapy with dose dense Adriamycin and Cytoxan x4 followed by Taxol weekly x12 2. followed by radiation therapy  3. Followed by adjuvant antiestrogen therapy. ------------------------------------------------------------------------------------------------------------------------------------------------- Echocardiogram August 2021: EF 55 to 60% Current treatment:Completed 4 cycles ofdose dense Adriamycin and Cytoxan, today is cycle8Taxol  Chemo toxicities: 1.Elevated blood sugars:still elevated 2.hair loss:Secondary to chemo 3.Elevated LFTs:Probably secondary to fatty liver.Monitoring closely 4.Mild thrombocytopenia:Improved 5.Lack of taste and appetite: Slight  improvement  Her brother passed away last week from suspicious reason.  Police were investigating the cause of his death.  She is extremely devastated about this.     Return to clinicweekly for Taxol every other week for follow-up with me.  No orders of the defined types were placed in this encounter.  The patient has a good understanding of the overall plan. she agrees with it. she will call with any problems that may develop before the next visit here.  Total time spent: 30 mins including face to face time and time spent for planning, charting and coordination of care  Nicholas Lose, MD 02/28/2020  I, Cloyde Reams Dorshimer, am acting as scribe for Dr. Nicholas Lose.  I have reviewed the above documentation for accuracy and completeness, and I agree with the above.

## 2020-02-28 ENCOUNTER — Inpatient Hospital Stay: Payer: Medicaid Other

## 2020-02-28 ENCOUNTER — Encounter: Payer: Self-pay | Admitting: *Deleted

## 2020-02-28 ENCOUNTER — Other Ambulatory Visit: Payer: Self-pay

## 2020-02-28 ENCOUNTER — Inpatient Hospital Stay (HOSPITAL_BASED_OUTPATIENT_CLINIC_OR_DEPARTMENT_OTHER): Payer: Medicaid Other | Admitting: Hematology and Oncology

## 2020-02-28 DIAGNOSIS — C50412 Malignant neoplasm of upper-outer quadrant of left female breast: Secondary | ICD-10-CM

## 2020-02-28 DIAGNOSIS — Z5111 Encounter for antineoplastic chemotherapy: Secondary | ICD-10-CM | POA: Diagnosis not present

## 2020-02-28 DIAGNOSIS — Z79899 Other long term (current) drug therapy: Secondary | ICD-10-CM | POA: Diagnosis not present

## 2020-02-28 DIAGNOSIS — D696 Thrombocytopenia, unspecified: Secondary | ICD-10-CM | POA: Diagnosis not present

## 2020-02-28 DIAGNOSIS — Z170 Estrogen receptor positive status [ER+]: Secondary | ICD-10-CM

## 2020-02-28 DIAGNOSIS — R739 Hyperglycemia, unspecified: Secondary | ICD-10-CM | POA: Diagnosis not present

## 2020-02-28 DIAGNOSIS — Z95828 Presence of other vascular implants and grafts: Secondary | ICD-10-CM

## 2020-02-28 DIAGNOSIS — R948 Abnormal results of function studies of other organs and systems: Secondary | ICD-10-CM | POA: Diagnosis not present

## 2020-02-28 DIAGNOSIS — R630 Anorexia: Secondary | ICD-10-CM | POA: Diagnosis not present

## 2020-02-28 LAB — CBC WITH DIFFERENTIAL (CANCER CENTER ONLY)
Abs Immature Granulocytes: 0.04 10*3/uL (ref 0.00–0.07)
Basophils Absolute: 0.1 10*3/uL (ref 0.0–0.1)
Basophils Relative: 1 %
Eosinophils Absolute: 0.2 10*3/uL (ref 0.0–0.5)
Eosinophils Relative: 3 %
HCT: 39.1 % (ref 36.0–46.0)
Hemoglobin: 13.5 g/dL (ref 12.0–15.0)
Immature Granulocytes: 1 %
Lymphocytes Relative: 24 %
Lymphs Abs: 1.5 10*3/uL (ref 0.7–4.0)
MCH: 33.4 pg (ref 26.0–34.0)
MCHC: 34.5 g/dL (ref 30.0–36.0)
MCV: 96.8 fL (ref 80.0–100.0)
Monocytes Absolute: 0.4 10*3/uL (ref 0.1–1.0)
Monocytes Relative: 6 %
Neutro Abs: 3.9 10*3/uL (ref 1.7–7.7)
Neutrophils Relative %: 65 %
Platelet Count: 219 10*3/uL (ref 150–400)
RBC: 4.04 MIL/uL (ref 3.87–5.11)
RDW: 12.9 % (ref 11.5–15.5)
WBC Count: 6.1 10*3/uL (ref 4.0–10.5)
nRBC: 0 % (ref 0.0–0.2)

## 2020-02-28 LAB — CMP (CANCER CENTER ONLY)
ALT: 55 U/L — ABNORMAL HIGH (ref 0–44)
AST: 44 U/L — ABNORMAL HIGH (ref 15–41)
Albumin: 3.8 g/dL (ref 3.5–5.0)
Alkaline Phosphatase: 98 U/L (ref 38–126)
Anion gap: 12 (ref 5–15)
BUN: 8 mg/dL (ref 6–20)
CO2: 21 mmol/L — ABNORMAL LOW (ref 22–32)
Calcium: 9.2 mg/dL (ref 8.9–10.3)
Chloride: 105 mmol/L (ref 98–111)
Creatinine: 0.72 mg/dL (ref 0.44–1.00)
GFR, Estimated: >60 mL/min (ref >60)
Glucose, Bld: 151 mg/dL — ABNORMAL HIGH (ref 70–99)
Potassium: 3.9 mmol/L (ref 3.5–5.1)
Sodium: 138 mmol/L (ref 135–145)
Total Bilirubin: 0.4 mg/dL (ref 0.3–1.2)
Total Protein: 6.8 g/dL (ref 6.5–8.1)

## 2020-02-28 MED ORDER — DIPHENHYDRAMINE HCL 50 MG/ML IJ SOLN
INTRAMUSCULAR | Status: AC
  Filled 2020-02-28: qty 1

## 2020-02-28 MED ORDER — DIPHENHYDRAMINE HCL 50 MG/ML IJ SOLN
25.0000 mg | Freq: Once | INTRAMUSCULAR | Status: AC
  Administered 2020-02-28: 25 mg via INTRAVENOUS

## 2020-02-28 MED ORDER — HEPARIN SOD (PORK) LOCK FLUSH 100 UNIT/ML IV SOLN
500.0000 [IU] | Freq: Once | INTRAVENOUS | Status: AC | PRN
  Administered 2020-02-28: 500 [IU]
  Filled 2020-02-28: qty 5

## 2020-02-28 MED ORDER — SODIUM CHLORIDE 0.9 % IV SOLN
80.0000 mg/m2 | Freq: Once | INTRAVENOUS | Status: AC
  Administered 2020-02-28: 144 mg via INTRAVENOUS
  Filled 2020-02-28: qty 24

## 2020-02-28 MED ORDER — SODIUM CHLORIDE 0.9% FLUSH
10.0000 mL | INTRAVENOUS | Status: DC | PRN
  Administered 2020-02-28: 10 mL
  Filled 2020-02-28: qty 10

## 2020-02-28 MED ORDER — PALONOSETRON HCL INJECTION 0.25 MG/5ML
0.2500 mg | Freq: Once | INTRAVENOUS | Status: AC
  Administered 2020-02-28: 0.25 mg via INTRAVENOUS

## 2020-02-28 MED ORDER — SODIUM CHLORIDE 0.9 % IV SOLN
Freq: Once | INTRAVENOUS | Status: AC
  Filled 2020-02-28: qty 250

## 2020-02-28 MED ORDER — FAMOTIDINE IN NACL 20-0.9 MG/50ML-% IV SOLN
20.0000 mg | Freq: Once | INTRAVENOUS | Status: AC
  Administered 2020-02-28: 20 mg via INTRAVENOUS

## 2020-02-28 MED ORDER — FAMOTIDINE IN NACL 20-0.9 MG/50ML-% IV SOLN
INTRAVENOUS | Status: AC
  Filled 2020-02-28: qty 50

## 2020-02-28 MED ORDER — SODIUM CHLORIDE 0.9% FLUSH
10.0000 mL | Freq: Once | INTRAVENOUS | Status: AC
  Administered 2020-02-28: 10 mL
  Filled 2020-02-28: qty 10

## 2020-02-28 MED ORDER — PALONOSETRON HCL INJECTION 0.25 MG/5ML
INTRAVENOUS | Status: AC
  Filled 2020-02-28: qty 5

## 2020-02-28 NOTE — Patient Instructions (Signed)

## 2020-02-28 NOTE — Patient Instructions (Signed)
[facility] Discharge Instructions for Patients Receiving Chemotherapy  Today you received the following chemotherapy agents :  Taxol.  To help prevent nausea and vomiting after your treatment, we encourage you to take your nausea medication as prescribed.   If you develop nausea and vomiting that is not controlled by your nausea medication, call the clinic.   BELOW ARE SYMPTOMS THAT SHOULD BE REPORTED IMMEDIATELY:  *FEVER GREATER THAN 100.5 F  *CHILLS WITH OR WITHOUT FEVER  NAUSEA AND VOMITING THAT IS NOT CONTROLLED WITH YOUR NAUSEA MEDICATION  *UNUSUAL SHORTNESS OF BREATH  *UNUSUAL BRUISING OR BLEEDING  TENDERNESS IN MOUTH AND THROAT WITH OR WITHOUT PRESENCE OF ULCERS  *URINARY PROBLEMS  *BOWEL PROBLEMS  UNUSUAL RASH Items with * indicate a potential emergency and should be followed up as soon as possible.  Feel free to call the clinic should you have any questions or concerns. The clinic phone number is [phone].  Please show the CHEMO ALERT CARD at check-in to the Emergency Department and triage nurse.   

## 2020-02-29 ENCOUNTER — Telehealth: Payer: Self-pay | Admitting: Hematology and Oncology

## 2020-02-29 NOTE — Telephone Encounter (Signed)
No 11/23 los, no changes made to pt schedule 

## 2020-03-06 ENCOUNTER — Other Ambulatory Visit: Payer: Self-pay

## 2020-03-06 ENCOUNTER — Inpatient Hospital Stay: Payer: Medicaid Other

## 2020-03-06 VITALS — BP 124/75 | HR 88 | Temp 98.6°F | Resp 18 | Wt 153.5 lb

## 2020-03-06 DIAGNOSIS — R948 Abnormal results of function studies of other organs and systems: Secondary | ICD-10-CM | POA: Diagnosis not present

## 2020-03-06 DIAGNOSIS — Z170 Estrogen receptor positive status [ER+]: Secondary | ICD-10-CM

## 2020-03-06 DIAGNOSIS — Z5111 Encounter for antineoplastic chemotherapy: Secondary | ICD-10-CM | POA: Diagnosis not present

## 2020-03-06 DIAGNOSIS — R630 Anorexia: Secondary | ICD-10-CM | POA: Diagnosis not present

## 2020-03-06 DIAGNOSIS — C50412 Malignant neoplasm of upper-outer quadrant of left female breast: Secondary | ICD-10-CM | POA: Diagnosis not present

## 2020-03-06 DIAGNOSIS — Z95828 Presence of other vascular implants and grafts: Secondary | ICD-10-CM

## 2020-03-06 DIAGNOSIS — R739 Hyperglycemia, unspecified: Secondary | ICD-10-CM | POA: Diagnosis not present

## 2020-03-06 DIAGNOSIS — Z79899 Other long term (current) drug therapy: Secondary | ICD-10-CM | POA: Diagnosis not present

## 2020-03-06 DIAGNOSIS — D696 Thrombocytopenia, unspecified: Secondary | ICD-10-CM | POA: Diagnosis not present

## 2020-03-06 LAB — CBC WITH DIFFERENTIAL (CANCER CENTER ONLY)
Abs Immature Granulocytes: 0.04 10*3/uL (ref 0.00–0.07)
Basophils Absolute: 0 10*3/uL (ref 0.0–0.1)
Basophils Relative: 0 %
Eosinophils Absolute: 0.2 10*3/uL (ref 0.0–0.5)
Eosinophils Relative: 3 %
HCT: 38.4 % (ref 36.0–46.0)
Hemoglobin: 13.2 g/dL (ref 12.0–15.0)
Immature Granulocytes: 1 %
Lymphocytes Relative: 22 %
Lymphs Abs: 1.5 10*3/uL (ref 0.7–4.0)
MCH: 33.5 pg (ref 26.0–34.0)
MCHC: 34.4 g/dL (ref 30.0–36.0)
MCV: 97.5 fL (ref 80.0–100.0)
Monocytes Absolute: 0.5 10*3/uL (ref 0.1–1.0)
Monocytes Relative: 7 %
Neutro Abs: 4.5 10*3/uL (ref 1.7–7.7)
Neutrophils Relative %: 67 %
Platelet Count: 225 10*3/uL (ref 150–400)
RBC: 3.94 MIL/uL (ref 3.87–5.11)
RDW: 12.9 % (ref 11.5–15.5)
WBC Count: 6.7 10*3/uL (ref 4.0–10.5)
nRBC: 0 % (ref 0.0–0.2)

## 2020-03-06 LAB — CMP (CANCER CENTER ONLY)
ALT: 49 U/L — ABNORMAL HIGH (ref 0–44)
AST: 32 U/L (ref 15–41)
Albumin: 3.6 g/dL (ref 3.5–5.0)
Alkaline Phosphatase: 104 U/L (ref 38–126)
Anion gap: 10 (ref 5–15)
BUN: 12 mg/dL (ref 6–20)
CO2: 23 mmol/L (ref 22–32)
Calcium: 9.3 mg/dL (ref 8.9–10.3)
Chloride: 107 mmol/L (ref 98–111)
Creatinine: 0.67 mg/dL (ref 0.44–1.00)
GFR, Estimated: >60 mL/min (ref >60)
Glucose, Bld: 116 mg/dL — ABNORMAL HIGH (ref 70–99)
Potassium: 3.8 mmol/L (ref 3.5–5.1)
Sodium: 140 mmol/L (ref 135–145)
Total Bilirubin: 0.4 mg/dL (ref 0.3–1.2)
Total Protein: 6.5 g/dL (ref 6.5–8.1)

## 2020-03-06 MED ORDER — DIPHENHYDRAMINE HCL 50 MG/ML IJ SOLN
INTRAMUSCULAR | Status: AC
  Filled 2020-03-06: qty 1

## 2020-03-06 MED ORDER — SODIUM CHLORIDE 0.9% FLUSH
10.0000 mL | Freq: Once | INTRAVENOUS | Status: AC
  Administered 2020-03-06: 10 mL
  Filled 2020-03-06: qty 10

## 2020-03-06 MED ORDER — HEPARIN SOD (PORK) LOCK FLUSH 100 UNIT/ML IV SOLN
500.0000 [IU] | Freq: Once | INTRAVENOUS | Status: AC | PRN
  Administered 2020-03-06: 500 [IU]
  Filled 2020-03-06: qty 5

## 2020-03-06 MED ORDER — SODIUM CHLORIDE 0.9% FLUSH
10.0000 mL | INTRAVENOUS | Status: DC | PRN
  Administered 2020-03-06: 10 mL
  Filled 2020-03-06: qty 10

## 2020-03-06 MED ORDER — SODIUM CHLORIDE 0.9 % IV SOLN
80.0000 mg/m2 | Freq: Once | INTRAVENOUS | Status: AC
  Administered 2020-03-06: 144 mg via INTRAVENOUS
  Filled 2020-03-06: qty 24

## 2020-03-06 MED ORDER — SODIUM CHLORIDE 0.9 % IV SOLN
Freq: Once | INTRAVENOUS | Status: AC
  Filled 2020-03-06: qty 250

## 2020-03-06 MED ORDER — PALONOSETRON HCL INJECTION 0.25 MG/5ML
0.2500 mg | Freq: Once | INTRAVENOUS | Status: AC
  Administered 2020-03-06: 0.25 mg via INTRAVENOUS

## 2020-03-06 MED ORDER — FAMOTIDINE IN NACL 20-0.9 MG/50ML-% IV SOLN
20.0000 mg | Freq: Once | INTRAVENOUS | Status: AC
  Administered 2020-03-06: 20 mg via INTRAVENOUS

## 2020-03-06 MED ORDER — PALONOSETRON HCL INJECTION 0.25 MG/5ML
INTRAVENOUS | Status: AC
  Filled 2020-03-06: qty 5

## 2020-03-06 MED ORDER — DIPHENHYDRAMINE HCL 50 MG/ML IJ SOLN
25.0000 mg | Freq: Once | INTRAMUSCULAR | Status: AC
  Administered 2020-03-06: 25 mg via INTRAVENOUS

## 2020-03-06 MED ORDER — FAMOTIDINE IN NACL 20-0.9 MG/50ML-% IV SOLN
INTRAVENOUS | Status: AC
  Filled 2020-03-06: qty 50

## 2020-03-06 NOTE — Progress Notes (Signed)
Patient discharged in stable condition.

## 2020-03-06 NOTE — Patient Instructions (Signed)

## 2020-03-06 NOTE — Patient Instructions (Signed)
[facility] Discharge Instructions for Patients Receiving Chemotherapy  Today you received the following chemotherapy agents: paclitaxel.  To help prevent nausea and vomiting after your treatment, we encourage you to take your nausea medication as directed.   If you develop nausea and vomiting that is not controlled by your nausea medication, call the clinic.   BELOW ARE SYMPTOMS THAT SHOULD BE REPORTED IMMEDIATELY:  *FEVER GREATER THAN 100.5 F  *CHILLS WITH OR WITHOUT FEVER  NAUSEA AND VOMITING THAT IS NOT CONTROLLED WITH YOUR NAUSEA MEDICATION  *UNUSUAL SHORTNESS OF BREATH  *UNUSUAL BRUISING OR BLEEDING  TENDERNESS IN MOUTH AND THROAT WITH OR WITHOUT PRESENCE OF ULCERS  *URINARY PROBLEMS  *BOWEL PROBLEMS  UNUSUAL RASH Items with * indicate a potential emergency and should be followed up as soon as possible.  Feel free to call the clinic should you have any questions or concerns. The clinic phone number is [phone].  Please show the CHEMO ALERT CARD at check-in to the Emergency Department and triage nurse.   

## 2020-03-13 ENCOUNTER — Other Ambulatory Visit: Payer: Self-pay

## 2020-03-13 ENCOUNTER — Inpatient Hospital Stay: Payer: Medicaid Other | Attending: Hematology and Oncology

## 2020-03-13 ENCOUNTER — Inpatient Hospital Stay: Payer: Medicaid Other

## 2020-03-13 VITALS — BP 118/69 | HR 83 | Temp 98.1°F | Resp 18 | Wt 151.0 lb

## 2020-03-13 DIAGNOSIS — Z79899 Other long term (current) drug therapy: Secondary | ICD-10-CM | POA: Insufficient documentation

## 2020-03-13 DIAGNOSIS — C50412 Malignant neoplasm of upper-outer quadrant of left female breast: Secondary | ICD-10-CM

## 2020-03-13 DIAGNOSIS — Z5111 Encounter for antineoplastic chemotherapy: Secondary | ICD-10-CM | POA: Insufficient documentation

## 2020-03-13 DIAGNOSIS — Z170 Estrogen receptor positive status [ER+]: Secondary | ICD-10-CM | POA: Diagnosis not present

## 2020-03-13 DIAGNOSIS — R739 Hyperglycemia, unspecified: Secondary | ICD-10-CM | POA: Insufficient documentation

## 2020-03-13 DIAGNOSIS — R112 Nausea with vomiting, unspecified: Secondary | ICD-10-CM | POA: Insufficient documentation

## 2020-03-13 DIAGNOSIS — R948 Abnormal results of function studies of other organs and systems: Secondary | ICD-10-CM | POA: Diagnosis not present

## 2020-03-13 DIAGNOSIS — D696 Thrombocytopenia, unspecified: Secondary | ICD-10-CM | POA: Diagnosis not present

## 2020-03-13 DIAGNOSIS — Z95828 Presence of other vascular implants and grafts: Secondary | ICD-10-CM

## 2020-03-13 LAB — CBC WITH DIFFERENTIAL (CANCER CENTER ONLY)
Abs Immature Granulocytes: 0.06 10*3/uL (ref 0.00–0.07)
Basophils Absolute: 0 10*3/uL (ref 0.0–0.1)
Basophils Relative: 1 %
Eosinophils Absolute: 0.2 10*3/uL (ref 0.0–0.5)
Eosinophils Relative: 2 %
HCT: 40.7 % (ref 36.0–46.0)
Hemoglobin: 14.2 g/dL (ref 12.0–15.0)
Immature Granulocytes: 1 %
Lymphocytes Relative: 16 %
Lymphs Abs: 1.4 10*3/uL (ref 0.7–4.0)
MCH: 33.5 pg (ref 26.0–34.0)
MCHC: 34.9 g/dL (ref 30.0–36.0)
MCV: 96 fL (ref 80.0–100.0)
Monocytes Absolute: 0.4 10*3/uL (ref 0.1–1.0)
Monocytes Relative: 5 %
Neutro Abs: 6.8 10*3/uL (ref 1.7–7.7)
Neutrophils Relative %: 75 %
Platelet Count: 249 10*3/uL (ref 150–400)
RBC: 4.24 MIL/uL (ref 3.87–5.11)
RDW: 13 % (ref 11.5–15.5)
WBC Count: 8.9 10*3/uL (ref 4.0–10.5)
nRBC: 0 % (ref 0.0–0.2)

## 2020-03-13 LAB — CMP (CANCER CENTER ONLY)
ALT: 57 U/L — ABNORMAL HIGH (ref 0–44)
AST: 37 U/L (ref 15–41)
Albumin: 3.8 g/dL (ref 3.5–5.0)
Alkaline Phosphatase: 107 U/L (ref 38–126)
Anion gap: 11 (ref 5–15)
BUN: 5 mg/dL — ABNORMAL LOW (ref 6–20)
CO2: 23 mmol/L (ref 22–32)
Calcium: 9.5 mg/dL (ref 8.9–10.3)
Chloride: 106 mmol/L (ref 98–111)
Creatinine: 0.7 mg/dL (ref 0.44–1.00)
GFR, Estimated: >60 mL/min (ref >60)
Glucose, Bld: 168 mg/dL — ABNORMAL HIGH (ref 70–99)
Potassium: 3.7 mmol/L (ref 3.5–5.1)
Sodium: 140 mmol/L (ref 135–145)
Total Bilirubin: 0.4 mg/dL (ref 0.3–1.2)
Total Protein: 6.9 g/dL (ref 6.5–8.1)

## 2020-03-13 MED ORDER — PALONOSETRON HCL INJECTION 0.25 MG/5ML
INTRAVENOUS | Status: AC
  Filled 2020-03-13: qty 5

## 2020-03-13 MED ORDER — FAMOTIDINE IN NACL 20-0.9 MG/50ML-% IV SOLN
INTRAVENOUS | Status: AC
  Filled 2020-03-13: qty 50

## 2020-03-13 MED ORDER — DIPHENHYDRAMINE HCL 50 MG/ML IJ SOLN
25.0000 mg | Freq: Once | INTRAMUSCULAR | Status: AC
  Administered 2020-03-13: 25 mg via INTRAVENOUS

## 2020-03-13 MED ORDER — SODIUM CHLORIDE 0.9 % IV SOLN
80.0000 mg/m2 | Freq: Once | INTRAVENOUS | Status: AC
  Administered 2020-03-13: 144 mg via INTRAVENOUS
  Filled 2020-03-13: qty 24

## 2020-03-13 MED ORDER — DIPHENHYDRAMINE HCL 50 MG/ML IJ SOLN
INTRAMUSCULAR | Status: AC
  Filled 2020-03-13: qty 1

## 2020-03-13 MED ORDER — HEPARIN SOD (PORK) LOCK FLUSH 100 UNIT/ML IV SOLN
500.0000 [IU] | Freq: Once | INTRAVENOUS | Status: AC | PRN
  Administered 2020-03-13: 500 [IU]
  Filled 2020-03-13: qty 5

## 2020-03-13 MED ORDER — SODIUM CHLORIDE 0.9% FLUSH
10.0000 mL | INTRAVENOUS | Status: DC | PRN
  Administered 2020-03-13: 10 mL
  Filled 2020-03-13: qty 10

## 2020-03-13 MED ORDER — PALONOSETRON HCL INJECTION 0.25 MG/5ML
0.2500 mg | Freq: Once | INTRAVENOUS | Status: AC
  Administered 2020-03-13: 0.25 mg via INTRAVENOUS

## 2020-03-13 MED ORDER — SODIUM CHLORIDE 0.9% FLUSH
10.0000 mL | Freq: Once | INTRAVENOUS | Status: AC
  Administered 2020-03-13: 10 mL
  Filled 2020-03-13: qty 10

## 2020-03-13 MED ORDER — SODIUM CHLORIDE 0.9 % IV SOLN
Freq: Once | INTRAVENOUS | Status: AC
  Filled 2020-03-13: qty 250

## 2020-03-13 MED ORDER — FAMOTIDINE IN NACL 20-0.9 MG/50ML-% IV SOLN
20.0000 mg | Freq: Once | INTRAVENOUS | Status: AC
  Administered 2020-03-13: 20 mg via INTRAVENOUS

## 2020-03-13 NOTE — Progress Notes (Signed)
Patient stable upon leaving infusion room.  

## 2020-03-13 NOTE — Patient Instructions (Signed)
[facility] Discharge Instructions for Patients Receiving Chemotherapy  Today you received the following chemotherapy agents: paclitaxel.  To help prevent nausea and vomiting after your treatment, we encourage you to take your nausea medication as directed.   If you develop nausea and vomiting that is not controlled by your nausea medication, call the clinic.   BELOW ARE SYMPTOMS THAT SHOULD BE REPORTED IMMEDIATELY:  *FEVER GREATER THAN 100.5 F  *CHILLS WITH OR WITHOUT FEVER  NAUSEA AND VOMITING THAT IS NOT CONTROLLED WITH YOUR NAUSEA MEDICATION  *UNUSUAL SHORTNESS OF BREATH  *UNUSUAL BRUISING OR BLEEDING  TENDERNESS IN MOUTH AND THROAT WITH OR WITHOUT PRESENCE OF ULCERS  *URINARY PROBLEMS  *BOWEL PROBLEMS  UNUSUAL RASH Items with * indicate a potential emergency and should be followed up as soon as possible.  Feel free to call the clinic should you have any questions or concerns. The clinic phone number is [phone].  Please show the CHEMO ALERT CARD at check-in to the Emergency Department and triage nurse.   

## 2020-03-19 NOTE — Progress Notes (Signed)
Patient Care Team: Tonia Ghent, MD as PCP - General (Family Medicine) [name], RN as Registered Nurse [name], RN as Registered Nurse [name], RN as Oncology Nurse Navigator [name], RN as Oncology Nurse Navigator  DIAGNOSIS:    ICD-10-CM   1. Malignant neoplasm of upper-outer quadrant of left breast in female, estrogen receptor positive ([place])  C50.412 prochlorperazine (COMPAZINE) 10 MG tablet   Z17.0     SUMMARY OF ONCOLOGIC HISTORY: Oncology History  Malignant neoplasm of upper-outer quadrant of left breast in female, estrogen receptor positive ([place])  08/25/2019 Initial Diagnosis   Palpable left breast mass x1 month. Diagnostic mammogram and [place] on 08/18/19 showed a 2.8cm mass at the 2 o'clock position, and one lymph node with mild cortical thickening in the left axilla. 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   10/18/2019 Cancer Staging   Staging form: Breast, AJCC 8th Edition - Pathologic stage from 10/18/2019: Stage IB (pT2, pN0(sn), cM0, G3, ER+, PR+, HER2-) - Signed by Nicholas Lose, MD on 10/18/2019   10/21/2019 Oncotype testing   32/20%   11/15/2019 -  Chemotherapy   The patient had dexamethasone (DECADRON) 4 MG tablet, 4 mg (100 % of original dose 4 mg), , , 1 of 1 cycle, Start date: 12/30/2019, End date: 01/09/2020 Dose modification: 4 mg (original dose 4 mg, Cycle 0) DOXOrubicin (ADRIAMYCIN) chemo injection 90 mg, 50 mg/m2 = 90 mg (83.3 % of original dose 60 mg/m2), Intravenous,  Once, 4 of 4 cycles Dose modification: 50 mg/m2 (original dose 60 mg/m2, Cycle 1, Reason: Provider Judgment) Administration: 90 mg (11/15/2019), 90 mg (11/29/2019), 90 mg (12/13/2019), 90 mg  (12/27/2019) palonosetron (ALOXI) injection 0.25 mg, 0.25 mg, Intravenous,  Once, 13 of 15 cycles Administration: 0.25 mg (11/15/2019), 0.25 mg (11/29/2019), 0.25 mg (12/13/2019), 0.25 mg (12/27/2019), 0.25 mg (01/17/2020), 0.25 mg (01/24/2020), 0.25 mg (01/31/2020), 0.25 mg (02/07/2020), 0.25 mg (02/14/2020), 0.25 mg (02/21/2020), 0.25 mg (02/28/2020), 0.25 mg (03/06/2020), 0.25 mg (03/13/2020) ondansetron (ZOFRAN) injection 8 mg, 8 mg (100 % of original dose 8 mg), Intravenous,  Once, 0 of 2 cycles Dose modification: 8 mg (original dose 8 mg, Cycle 15) pegfilgrastim-jmdb (FULPHILA) injection 6 mg, 6 mg, Subcutaneous,  Once, 4 of 4 cycles Administration: 6 mg (11/17/2019), 6 mg (12/01/2019), 6 mg (12/15/2019), 6 mg (12/29/2019) cyclophosphamide (CYTOXAN) 900 mg in sodium chloride 0.9 % 250 mL chemo infusion, 500 mg/m2 = 900 mg (83.3 % of original dose 600 mg/m2), Intravenous,  Once, 4 of 4 cycles Dose modification: 500 mg/m2 (original dose 600 mg/m2, Cycle 1, Reason: Provider Judgment) Administration: 900 mg (11/15/2019), 900 mg (11/29/2019), 900 mg (12/13/2019), 900 mg (12/27/2019) PACLitaxel (TAXOL) 144 mg in sodium chloride 0.9 % 250 mL chemo infusion (</= 51m/m2), 80 mg/m2 = 144 mg, Intravenous,  Once, 10 of 12 cycles Administration: 144 mg (01/09/2020), 144 mg (01/17/2020), 144 mg (01/24/2020), 144 mg (01/31/2020), 144 mg (02/07/2020), 144 mg (02/14/2020), 144 mg (02/21/2020), 144 mg (02/28/2020), 144 mg (03/06/2020), 144 mg (03/13/2020) fosaprepitant (EMEND) 150 mg in sodium chloride 0.9 % 145 mL IVPB, 150 mg, Intravenous,  Once, 4 of 4 cycles Administration: 150 mg (11/15/2019), 150 mg (11/29/2019), 150 mg (12/13/2019), 150 mg (12/27/2019)  for chemotherapy treatment.      CHIEF COMPLIANT: Cycle11Taxol  INTERVAL HISTORY: LATISIA HILAIRE is a 54 y.o. with above-mentioned history of left breast cancer treated with lumpectomy andwho is currently on adjuvantchemotherapy withweekly Taxol after completing 4 cycles  ofdose dense Adriamycin and Cytoxan. She presents to the clinic today for a toxicity checkandcycle11.Tolerating the treatment extremely well. She however developed nausea and vomiting after the last 2 cycles of chemotherapy that lasted for 2 to 3 days. She ran out of her nausea medications until she did not take anything.  ALLERGIES:  is allergic to propoxyphene n-acetaminophen, nabumetone, and rofecoxib.  MEDICATIONS:  Current Outpatient Medications  Medication Sig Dispense Refill  . albuterol (PROVENTIL) (2.5 MG/3ML) 0.083% nebulizer solution Take 3 mLs (2.5 mg total) by nebulization every 6 (six) hours as needed for wheezing or shortness of breath. 75 mL 1  . albuterol (VENTOLIN HFA) 108 (90 Base) MCG/ACT inhaler Inhale 2 puffs into the lungs every 6 (six) hours as needed for wheezing or shortness of breath. 18 g 3  . blood glucose meter kit and supplies KIT Dispense based on patient and insurance preference. Use up to four times daily as directed. (FOR ICD-9 250.00, 250.01). 1 each 0  . gabapentin (NEURONTIN) 300 MG capsule Take up to 4 tabs a day (2 tabs twice a day) 120 capsule 3  . HYDROcodone-acetaminophen (NORCO) 5-325 MG tablet Take 1-2 tablets by mouth every 6 (six) hours as needed for severe pain. Sedation caution 30 tablet 0  . ibuprofen (ADVIL) 600 MG tablet Take 1 tablet (600 mg total) by mouth every 8 (eight) hours as needed (For pain.  Take with food.). 90 tablet 2  . metFORMIN (GLUCOPHAGE) 500 MG tablet Take 1 tablet (500 mg total) by mouth 2 (two) times daily with a meal. 60 tablet 3  . omeprazole (PRILOSEC OTC) 20 MG tablet Take 20 mg by mouth daily.    . prochlorperazine (COMPAZINE) 10 MG tablet Take 1 tablet (10 mg total) by mouth every 6 (six) hours as needed (Nausea or vomiting). 30 tablet 0   No current facility-administered medications for this visit.    PHYSICAL EXAMINATION: ECOG PERFORMANCE STATUS: 1 - Symptomatic but completely ambulatory  Vitals:   03/20/20  1021  BP: (!) 144/81  Pulse: 90  Resp: 18  Temp: 97.7 F (36.5 C)  SpO2: 99%   Filed Weights   03/20/20 1021  Weight: 150 lb 11.2 oz (68.4 kg)    LABORATORY DATA:  I have reviewed the data as listed CMP Latest Ref Rng & Units 03/13/2020 03/06/2020 02/28/2020  Glucose 70 - 99 mg/dL 168(H) 116(H) 151(H)  BUN 6 - 20 mg/dL 5(L) 12 8  Creatinine 0.44 - 1.00 mg/dL 0.70 0.67 0.72  Sodium 135 - 145 mmol/L 140 140 138  Potassium 3.5 - 5.1 mmol/L 3.7 3.8 3.9  Chloride 98 - 111 mmol/L 106 107 105  CO2 22 - 32 mmol/L 23 23 21(L)  Calcium 8.9 - 10.3 mg/dL 9.5 9.3 9.2  Total Protein 6.5 - 8.1 g/dL 6.9 6.5 6.8  Total Bilirubin 0.3 - 1.2 mg/dL 0.4 0.4 0.4  Alkaline Phos 38 - 126 U/L 107 104 98  AST 15 - 41 U/L 37 32 44(H)  ALT 0 - 44 U/L 57(H) 49(H) 55(H)    Lab Results  Component Value Date   WBC 10.6 (H) 03/20/2020   HGB 14.3 03/20/2020   HCT 40.7 03/20/2020   MCV 95.5 03/20/2020   PLT 235 03/20/2020  NEUTROABS 8.4 (H) 03/20/2020    ASSESSMENT & PLAN:  Malignant neoplasm of upper-outer quadrant of left breast in female, estrogen receptor positive ([place]) 10/07/2019:Left lumpectomy and sentinel lymph node biopsy [name]): IDC with DCIS, 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 T2N0 stage Ib Oncotype DX score 32: 20% risk of distant recurrence in 9 years  Treatment plan:  1. Adjuvant chemotherapy with dose dense Adriamycin and Cytoxan x4 followed by Taxol weekly x12 2. followed by radiation therapy  3. Followed by adjuvant antiestrogen therapy. ------------------------------------------------------------------------------------------------------------------------------------------------- Echocardiogram August 2021: EF 55 to 60% Current treatment:Completed 4 cycles ofdose dense Adriamycin and Cytoxan, today is cycle11Taxol  Chemo  toxicities: 1.Elevated blood sugars:still elevated 2.hair loss:Secondary to chemo 3.Elevated LFTs:Probably secondary to fatty liver.Monitoring closely 4.Mild thrombocytopenia:Improved 5.Lack of taste and appetite:Slight improvement 6. Nausea and vomiting: I sent a prescription for Compazine and added Zofran to her treatment today.  Her aunt was diagnosed with breast cancer and she is devastated about that. I will request radiation oncology consultation. I will send a message to Dr. [name] to remove her port  Return to clinic next week for her last Taxol treatment. I will see her at the end of radiation.     No orders of the defined types were placed in this encounter.  The patient has a good understanding of the overall plan. she agrees with it. she will call with any problems that may develop before the next visit here.  Total time spent: 30 mins including face to face time and time spent for planning, charting and coordination of care  Nicholas Lose, MD 03/20/2020  I, Cloyde Reams Dorshimer, am acting as scribe for Dr. Nicholas Lose.  I have reviewed the above documentation for accuracy and completeness, and I agree with the above.

## 2020-03-20 ENCOUNTER — Other Ambulatory Visit: Payer: Self-pay

## 2020-03-20 ENCOUNTER — Inpatient Hospital Stay (HOSPITAL_BASED_OUTPATIENT_CLINIC_OR_DEPARTMENT_OTHER): Payer: Medicaid Other | Admitting: Hematology and Oncology

## 2020-03-20 ENCOUNTER — Inpatient Hospital Stay: Payer: Medicaid Other

## 2020-03-20 ENCOUNTER — Encounter: Payer: Self-pay | Admitting: *Deleted

## 2020-03-20 DIAGNOSIS — Z170 Estrogen receptor positive status [ER+]: Secondary | ICD-10-CM

## 2020-03-20 DIAGNOSIS — C50412 Malignant neoplasm of upper-outer quadrant of left female breast: Secondary | ICD-10-CM | POA: Diagnosis not present

## 2020-03-20 DIAGNOSIS — Z5111 Encounter for antineoplastic chemotherapy: Secondary | ICD-10-CM | POA: Diagnosis not present

## 2020-03-20 LAB — CBC WITH DIFFERENTIAL (CANCER CENTER ONLY)
Abs Immature Granulocytes: 0.06 10*3/uL (ref 0.00–0.07)
Basophils Absolute: 0.1 10*3/uL (ref 0.0–0.1)
Basophils Relative: 1 %
Eosinophils Absolute: 0.2 10*3/uL (ref 0.0–0.5)
Eosinophils Relative: 1 %
HCT: 40.7 % (ref 36.0–46.0)
Hemoglobin: 14.3 g/dL (ref 12.0–15.0)
Immature Granulocytes: 1 %
Lymphocytes Relative: 13 %
Lymphs Abs: 1.3 10*3/uL (ref 0.7–4.0)
MCH: 33.6 pg (ref 26.0–34.0)
MCHC: 35.1 g/dL (ref 30.0–36.0)
MCV: 95.5 fL (ref 80.0–100.0)
Monocytes Absolute: 0.6 10*3/uL (ref 0.1–1.0)
Monocytes Relative: 6 %
Neutro Abs: 8.4 10*3/uL — ABNORMAL HIGH (ref 1.7–7.7)
Neutrophils Relative %: 78 %
Platelet Count: 235 10*3/uL (ref 150–400)
RBC: 4.26 MIL/uL (ref 3.87–5.11)
RDW: 13.1 % (ref 11.5–15.5)
WBC Count: 10.6 10*3/uL — ABNORMAL HIGH (ref 4.0–10.5)
nRBC: 0 % (ref 0.0–0.2)

## 2020-03-20 LAB — CMP (CANCER CENTER ONLY)
ALT: 43 U/L (ref 0–44)
AST: 29 U/L (ref 15–41)
Albumin: 4 g/dL (ref 3.5–5.0)
Alkaline Phosphatase: 97 U/L (ref 38–126)
Anion gap: 8 (ref 5–15)
BUN: 8 mg/dL (ref 6–20)
CO2: 23 mmol/L (ref 22–32)
Calcium: 9.6 mg/dL (ref 8.9–10.3)
Chloride: 106 mmol/L (ref 98–111)
Creatinine: 0.68 mg/dL (ref 0.44–1.00)
GFR, Estimated: >60 mL/min (ref >60)
Glucose, Bld: 109 mg/dL — ABNORMAL HIGH (ref 70–99)
Potassium: 4.3 mmol/L (ref 3.5–5.1)
Sodium: 137 mmol/L (ref 135–145)
Total Bilirubin: 0.5 mg/dL (ref 0.3–1.2)
Total Protein: 7 g/dL (ref 6.5–8.1)

## 2020-03-20 MED ORDER — ONDANSETRON HCL 4 MG/2ML IJ SOLN
INTRAMUSCULAR | Status: AC
  Filled 2020-03-20: qty 4

## 2020-03-20 MED ORDER — PROCHLORPERAZINE MALEATE 10 MG PO TABS: 10.0000 mg | ORAL_TABLET | Freq: Four times a day (QID) | ORAL | 0 refills | Status: DC | PRN

## 2020-03-20 MED ORDER — FAMOTIDINE IN NACL 20-0.9 MG/50ML-% IV SOLN
INTRAVENOUS | Status: AC
  Filled 2020-03-20: qty 50

## 2020-03-20 MED ORDER — SODIUM CHLORIDE 0.9 % IV SOLN
Freq: Once | INTRAVENOUS | Status: AC
  Filled 2020-03-20: qty 250

## 2020-03-20 MED ORDER — ONDANSETRON HCL 4 MG/2ML IJ SOLN
8.0000 mg | Freq: Once | INTRAMUSCULAR | Status: AC
  Administered 2020-03-20: 12:00:00 8 mg via INTRAVENOUS

## 2020-03-20 MED ORDER — PALONOSETRON HCL INJECTION 0.25 MG/5ML
0.2500 mg | Freq: Once | INTRAVENOUS | Status: DC
Start: 2020-03-20 — End: 2020-03-20

## 2020-03-20 MED ORDER — SODIUM CHLORIDE 0.9% FLUSH
10.0000 mL | INTRAVENOUS | Status: DC | PRN
  Administered 2020-03-20: 14:00:00 10 mL
  Filled 2020-03-20: qty 10

## 2020-03-20 MED ORDER — DIPHENHYDRAMINE HCL 50 MG/ML IJ SOLN
25.0000 mg | Freq: Once | INTRAMUSCULAR | Status: AC
  Administered 2020-03-20: 12:00:00 25 mg via INTRAVENOUS

## 2020-03-20 MED ORDER — FAMOTIDINE IN NACL 20-0.9 MG/50ML-% IV SOLN
20.0000 mg | Freq: Once | INTRAVENOUS | Status: AC
  Administered 2020-03-20: 12:00:00 20 mg via INTRAVENOUS

## 2020-03-20 MED ORDER — DIPHENHYDRAMINE HCL 50 MG/ML IJ SOLN
INTRAMUSCULAR | Status: AC
  Filled 2020-03-20: qty 1

## 2020-03-20 MED ORDER — HEPARIN SOD (PORK) LOCK FLUSH 100 UNIT/ML IV SOLN
500.0000 [IU] | Freq: Once | INTRAVENOUS | Status: AC | PRN
  Administered 2020-03-20: 14:00:00 500 [IU]
  Filled 2020-03-20: qty 5

## 2020-03-20 MED ORDER — PACLITAXEL CHEMO INJECTION 300 MG/50ML
80.0000 mg/m2 | Freq: Once | INTRAVENOUS | Status: AC
  Administered 2020-03-20: 12:00:00 144 mg via INTRAVENOUS
  Filled 2020-03-20: qty 24

## 2020-03-20 NOTE — Progress Notes (Signed)
Change Aloxi to Zofran for Cycles 11 and 12 per Dr. Lindi Adie.  Hardie Pulley, PharmD, BCPS, BCOP

## 2020-03-20 NOTE — Assessment & Plan Note (Signed)
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 T2N0 stage Ib Oncotype DX score 32: 20% risk of distant recurrence in 9 years  Treatment plan:  1. Adjuvant chemotherapy with dose dense Adriamycin and Cytoxan x4 followed by Taxol weekly x12 2. followed by radiation therapy  3. Followed by adjuvant antiestrogen therapy. ------------------------------------------------------------------------------------------------------------------------------------------------- Echocardiogram August 2021: EF 55 to 60% Current treatment:Completed 4 cycles ofdose dense Adriamycin and Cytoxan, today is cycle10Taxol  Chemo toxicities: 1.Elevated blood sugars:still elevated 2.hair loss:Secondary to chemo 3.Elevated LFTs:Probably secondary to fatty liver.Monitoring closely 4.Mild thrombocytopenia:Improved 5.Lack of taste and appetite:Slight improvement  Her brother passed away last week from suspicious reason. Police were investigating the cause of his death. She is extremely devastated about this.    Return to clinicweekly for Taxol every other week for follow-up with me.

## 2020-03-20 NOTE — Patient Instructions (Signed)
[facility] Discharge Instructions for Patients Receiving Chemotherapy  Today you received the following chemotherapy agents Paclitaxel (TAXOL).  To help prevent nausea and vomiting after your treatment, we encourage you to take your nausea medication as prescribed.  If you develop nausea and vomiting that is not controlled by your nausea medication, call the clinic.   BELOW ARE SYMPTOMS THAT SHOULD BE REPORTED IMMEDIATELY:  *FEVER GREATER THAN 100.5 F  *CHILLS WITH OR WITHOUT FEVER  NAUSEA AND VOMITING THAT IS NOT CONTROLLED WITH YOUR NAUSEA MEDICATION  *UNUSUAL SHORTNESS OF BREATH  *UNUSUAL BRUISING OR BLEEDING  TENDERNESS IN MOUTH AND THROAT WITH OR WITHOUT PRESENCE OF ULCERS  *URINARY PROBLEMS  *BOWEL PROBLEMS  UNUSUAL RASH Items with * indicate a potential emergency and should be followed up as soon as possible.  Feel free to call the clinic should you have any questions or concerns. The clinic phone number is [phone].  Please show the CHEMO ALERT CARD at check-in to the Emergency Department and triage nurse.   

## 2020-03-21 ENCOUNTER — Telehealth: Payer: Self-pay | Admitting: Hematology and Oncology

## 2020-03-21 NOTE — Telephone Encounter (Signed)
No 12/14 los, no changes made to pt schedule   

## 2020-03-23 ENCOUNTER — Ambulatory Visit: Payer: Self-pay | Admitting: General Surgery

## 2020-03-24 ENCOUNTER — Other Ambulatory Visit: Payer: Self-pay | Admitting: Hematology and Oncology

## 2020-03-24 ENCOUNTER — Other Ambulatory Visit: Payer: Self-pay | Admitting: Family Medicine

## 2020-03-24 DIAGNOSIS — Z170 Estrogen receptor positive status [ER+]: Secondary | ICD-10-CM

## 2020-03-26 MED ORDER — PROCHLORPERAZINE MALEATE 10 MG PO TABS: 10.0000 mg | ORAL_TABLET | Freq: Four times a day (QID) | ORAL | 0 refills | Status: DC | PRN

## 2020-03-26 NOTE — Telephone Encounter (Signed)
Name of Medication: Hydrocodone-Acetaminophen 5-325 mg  Name of Pharmacy: [id number] [place], [place] or Written Date and Quantity: 02/22/2020 (Q-30, R-0) Last Office Visit and Type: 01/30/2020 Next Office Visit and Type: Not Scheduled  Last Controlled Substance Agreement Date: 02/24/2020 Last UDS: 02/24/2020

## 2020-03-27 ENCOUNTER — Inpatient Hospital Stay: Payer: Medicaid Other

## 2020-03-27 ENCOUNTER — Encounter: Payer: Self-pay | Admitting: *Deleted

## 2020-03-27 ENCOUNTER — Other Ambulatory Visit: Payer: Self-pay

## 2020-03-27 VITALS — BP 115/63 | HR 87 | Temp 98.5°F | Resp 18 | Ht 64.0 in | Wt 150.5 lb

## 2020-03-27 DIAGNOSIS — Z5111 Encounter for antineoplastic chemotherapy: Secondary | ICD-10-CM | POA: Diagnosis not present

## 2020-03-27 DIAGNOSIS — Z95828 Presence of other vascular implants and grafts: Secondary | ICD-10-CM

## 2020-03-27 DIAGNOSIS — Z170 Estrogen receptor positive status [ER+]: Secondary | ICD-10-CM

## 2020-03-27 DIAGNOSIS — C50412 Malignant neoplasm of upper-outer quadrant of left female breast: Secondary | ICD-10-CM

## 2020-03-27 LAB — CBC WITH DIFFERENTIAL (CANCER CENTER ONLY)
Abs Immature Granulocytes: 0.05 10*3/uL (ref 0.00–0.07)
Basophils Absolute: 0.1 10*3/uL (ref 0.0–0.1)
Basophils Relative: 1 %
Eosinophils Absolute: 0.2 10*3/uL (ref 0.0–0.5)
Eosinophils Relative: 3 %
HCT: 40.7 % (ref 36.0–46.0)
Hemoglobin: 14.3 g/dL (ref 12.0–15.0)
Immature Granulocytes: 1 %
Lymphocytes Relative: 24 %
Lymphs Abs: 1.6 10*3/uL (ref 0.7–4.0)
MCH: 33.3 pg (ref 26.0–34.0)
MCHC: 35.1 g/dL (ref 30.0–36.0)
MCV: 94.9 fL (ref 80.0–100.0)
Monocytes Absolute: 0.4 10*3/uL (ref 0.1–1.0)
Monocytes Relative: 6 %
Neutro Abs: 4.4 10*3/uL (ref 1.7–7.7)
Neutrophils Relative %: 65 %
Platelet Count: 237 10*3/uL (ref 150–400)
RBC: 4.29 MIL/uL (ref 3.87–5.11)
RDW: 13.2 % (ref 11.5–15.5)
WBC Count: 6.7 10*3/uL (ref 4.0–10.5)
nRBC: 0 % (ref 0.0–0.2)

## 2020-03-27 LAB — CMP (CANCER CENTER ONLY)
ALT: 47 U/L — ABNORMAL HIGH (ref 0–44)
AST: 31 U/L (ref 15–41)
Albumin: 3.9 g/dL (ref 3.5–5.0)
Alkaline Phosphatase: 95 U/L (ref 38–126)
Anion gap: 10 (ref 5–15)
BUN: 10 mg/dL (ref 6–20)
CO2: 23 mmol/L (ref 22–32)
Calcium: 9.6 mg/dL (ref 8.9–10.3)
Chloride: 105 mmol/L (ref 98–111)
Creatinine: 0.71 mg/dL (ref 0.44–1.00)
GFR, Estimated: >60 mL/min (ref >60)
Glucose, Bld: 173 mg/dL — ABNORMAL HIGH (ref 70–99)
Potassium: 4 mmol/L (ref 3.5–5.1)
Sodium: 138 mmol/L (ref 135–145)
Total Bilirubin: 0.3 mg/dL (ref 0.3–1.2)
Total Protein: 7.1 g/dL (ref 6.5–8.1)

## 2020-03-27 MED ORDER — DIPHENHYDRAMINE HCL 50 MG/ML IJ SOLN
25.0000 mg | Freq: Once | INTRAMUSCULAR | Status: AC
  Administered 2020-03-27: 12:00:00 25 mg via INTRAVENOUS

## 2020-03-27 MED ORDER — HYDROCODONE-ACETAMINOPHEN 5-325 MG PO TABS: 1.0000 | ORAL_TABLET | Freq: Four times a day (QID) | ORAL | 0 refills | Status: DC | PRN

## 2020-03-27 MED ORDER — FAMOTIDINE IN NACL 20-0.9 MG/50ML-% IV SOLN
20.0000 mg | Freq: Once | INTRAVENOUS | Status: AC
  Administered 2020-03-27: 12:00:00 20 mg via INTRAVENOUS

## 2020-03-27 MED ORDER — ONDANSETRON HCL 4 MG/2ML IJ SOLN
8.0000 mg | Freq: Once | INTRAMUSCULAR | Status: AC
  Administered 2020-03-27: 12:00:00 8 mg via INTRAVENOUS

## 2020-03-27 MED ORDER — SODIUM CHLORIDE 0.9% FLUSH
10.0000 mL | INTRAVENOUS | Status: DC | PRN
  Administered 2020-03-27: 14:00:00 10 mL
  Filled 2020-03-27: qty 10

## 2020-03-27 MED ORDER — DIPHENHYDRAMINE HCL 50 MG/ML IJ SOLN
INTRAMUSCULAR | Status: AC
  Filled 2020-03-27: qty 1

## 2020-03-27 MED ORDER — SODIUM CHLORIDE 0.9 % IV SOLN
80.0000 mg/m2 | Freq: Once | INTRAVENOUS | Status: AC
  Administered 2020-03-27: 13:00:00 144 mg via INTRAVENOUS
  Filled 2020-03-27: qty 24

## 2020-03-27 MED ORDER — ONDANSETRON HCL 4 MG/2ML IJ SOLN
INTRAMUSCULAR | Status: AC
  Filled 2020-03-27: qty 4

## 2020-03-27 MED ORDER — SODIUM CHLORIDE 0.9 % IV SOLN
Freq: Once | INTRAVENOUS | Status: AC
Start: 2020-03-27 — End: 2020-03-27
  Filled 2020-03-27: qty 250

## 2020-03-27 MED ORDER — SODIUM CHLORIDE 0.9% FLUSH
10.0000 mL | Freq: Once | INTRAVENOUS | Status: AC
  Administered 2020-03-27: 11:00:00 10 mL
  Filled 2020-03-27: qty 10

## 2020-03-27 MED ORDER — HEPARIN SOD (PORK) LOCK FLUSH 100 UNIT/ML IV SOLN
500.0000 [IU] | Freq: Once | INTRAVENOUS | Status: AC | PRN
  Administered 2020-03-27: 14:00:00 500 [IU]
  Filled 2020-03-27: qty 5

## 2020-03-27 MED ORDER — FAMOTIDINE IN NACL 20-0.9 MG/50ML-% IV SOLN
INTRAVENOUS | Status: AC
  Filled 2020-03-27: qty 50

## 2020-03-27 NOTE — Progress Notes (Signed)
Location of Breast Cancer: Malignant neoplasm of UOQ left breast  Did patient present with symptoms (if so, please note symptoms) or was this found on screening mammography?: Palpable mass  Diagnostic mammogram/US 08/18/2019: 2.8 cm mass at the 2 o'clock position and one lymph node with mild cortical thickening in the left axilla.   Histology per Pathology Report: Left Breast Lumpectomy 10/07/2019  Receptor Status: ER(90%+), PR (90%+), Her2-neu (-), Ki-()    Past/Anticipated interventions by surgeon, if any: Dr. [name] -Left lumpectomy and SLN biopsy 10/07/2019 -Posterior margin positive but no further surgery done because it is muscle on the back.  Past/Anticipated interventions by medical oncology, if any: Chemotherapy  Dr. Lindi Adie 03/20/2020 -Chemotherapy 11/15/2019-03/27/2020 Treatment plan:  1. Adjuvant chemotherapy with dose dense Adriamycin and Cytoxan x4 followed by Taxol weekly x12 2. followed by radiation therapy  3. Followed by adjuvant antiestrogen therapy.  Lymphedema issues, if any: Has noticed some swelling in her arm pit since yesterday.  Pain issues, if any:  Soreness in her armpit  SAFETY ISSUES:  Prior radiation? No  Pacemaker/ICD? No  Possible current pregnancy? Postmenopausal  Is the patient on methotrexate? No  Current Complaints / other details:   Oncotype DX score 32: 20% risk of distant recurrence in 9 years.    Port removal 05/11/2020  Cori Razor, RN 03/27/2020,1:10 PM

## 2020-03-27 NOTE — Patient Instructions (Signed)
[facility] Discharge Instructions for Patients Receiving Chemotherapy  Happy Last Day of Chemo!   Today you received the following chemotherapy agents: Paclitaxel (Taxol)   To help prevent nausea and vomiting after your treatment, we encourage you to take your nausea medication  as prescribed.    If you develop nausea and vomiting that is not controlled by your nausea medication, call the clinic.   BELOW ARE SYMPTOMS THAT SHOULD BE REPORTED IMMEDIATELY:  *FEVER GREATER THAN 100.5 F  *CHILLS WITH OR WITHOUT FEVER  NAUSEA AND VOMITING THAT IS NOT CONTROLLED WITH YOUR NAUSEA MEDICATION  *UNUSUAL SHORTNESS OF BREATH  *UNUSUAL BRUISING OR BLEEDING  TENDERNESS IN MOUTH AND THROAT WITH OR WITHOUT PRESENCE OF ULCERS  *URINARY PROBLEMS  *BOWEL PROBLEMS  UNUSUAL RASH Items with * indicate a potential emergency and should be followed up as soon as possible.  Feel free to call the clinic should you have any questions or concerns. The clinic phone number is [phone].  Please show the CHEMO ALERT CARD at check-in to the Emergency Department and triage nurse.   

## 2020-03-27 NOTE — Progress Notes (Signed)
Nutrition Assessment:  Patient identified by Malnutrition screening report for weight loss  54 year old female with left breast cancer.  S/p lumpectomy and today is last day for adjuvant chemotherapy.  Past medical history of DM, HLD.  Met with patient during infusion. Patient reports decreased appetite usually week following treatment then appetite increases.  Reports issues with nausea. Has been taking compazine.  Denies any other symptoms effecting intake    Medications: reviewed  Labs: reviewed  Anthropometrics:   Weight 150 lb 11.2 oz on 12/14, 11/30 153 lb 10/26 155 lb  3% weight loss in the last 2 months, not significant   NUTRITION DIAGNOSIS: Inadequate oral intake related to cancer treatment related side effects as evidenced by 3% weight loss in 2 months   INTERVENTION:  Reviewed strategies to help with nausea. Handout provided.  Encouraged well-balanced diet and weight maintenance.   Congratulated patient on last treatment. Contact information provided    NEXT VISIT: no follow-up Patient to contact as needed  Hadriel Northup B. Zenia Resides, [place], [place] Registered Dietitian [phone] (mobile)

## 2020-03-27 NOTE — Telephone Encounter (Signed)
Sent. Thanks.   

## 2020-03-28 ENCOUNTER — Ambulatory Visit
Admission: RE | Admit: 2020-03-28 | Discharge: 2020-03-28 | Disposition: A | Discharge status: Home / Self Care | Payer: Medicaid Other | Source: Ambulatory Visit | Attending: Radiation Oncology | Admitting: Radiation Oncology

## 2020-03-28 ENCOUNTER — Encounter: Payer: Self-pay | Admitting: Radiation Oncology

## 2020-03-28 ENCOUNTER — Other Ambulatory Visit: Payer: Self-pay

## 2020-03-28 VITALS — BP 147/71 | HR 92 | Temp 97.2°F | Resp 18 | Ht 64.0 in | Wt 151.4 lb

## 2020-03-28 DIAGNOSIS — Z170 Estrogen receptor positive status [ER+]: Secondary | ICD-10-CM

## 2020-03-28 NOTE — Progress Notes (Signed)
Radiation Oncology         [phone] ________________________________  Name: Julia [name]        MRN: [id number]  Date of Service: 03/28/2020 DOB: Jun 27, 1965  WC:BJSEGB, Elveria Rising, MD  Nicholas Lose, MD     REFERRING PHYSICIAN: Nicholas Lose, MD   DIAGNOSIS: The encounter diagnosis was Malignant neoplasm of upper-outer quadrant of left breast in female, estrogen receptor positive ([place]).   HISTORY OF PRESENT ILLNESS: Julia [name] is a 54 y.o. female seen for a history of breast cancer.  The patient was originally diagnosed with her cancer in the summer 2021 when she presented with a palpable mass in the left breast.  Diagnostic work-up revealed a 2.8 cm mass in the 2 o'clock position and one abnormal appearing left axillary node.  A biopsy on 08/25/2019 revealed a grade 3 invasive ductal carcinoma, her biopsy of the axilla was negative, her tumor was ER/PR positive, HER-2 was negative by FISH, she subsequently underwent left lumpectomy and sentinel lymph node biopsy on 10/27/2019 which revealed a grade 3 invasive ductal carcinoma with associated DCIS that measured 3.3 cm for the invasive component.  6 sampled lymph nodes were all negative for disease and her margins were clear.  She did have Oncotype testing which revealed a high risk score of 32 and subsequently began adjuvant chemotherapy between 11/15/2019 and 03/27/2020.  She is seen today to discuss treatment recommendations for adjuvant therapy.   PREVIOUS RADIATION THERAPY: No   PAST MEDICAL HISTORY:  Past Medical History:  Diagnosis Date  . Anxiety    over cancer diagnosis  . Asthma    bronchial asthma   . Cancer ([place]) 09/2019   left breast invasive mammary cancer  . Chronic back pain   . COPD (chronic obstructive pulmonary disease) (H[facility])    smokes 1 1/2 ppd  . GERD (gastroesophageal reflux disease)   . Pneumonia    hx of 2018   . Smoking        PAST SURGICAL HISTORY: Past Surgical History:  Procedure Laterality  Date  . BREAST LUMPECTOMY WITH SENTINEL LYMPH NODE BIOPSY Left 10/07/2019   Procedure: LEFT BREAST LUMPECTOMY WITH SENTINEL LYMPH NODE BX;  Surgeon: Jovita Kussmaul, MD;  Location: [facility];  Service: General;  Laterality: Left;  PEC BLOCK  . CHOLECYSTECTOMY    . PORTACATH PLACEMENT Right 11/10/2019   Procedure: INSERTION PORT-A-CATH;  Surgeon: Jovita Kussmaul, MD;  Location: [facility];  Service: General;  Laterality: Right;  . TONSILLECTOMY    . VULVECTOMY N/A 01/30/2015   Procedure: WIDE LOCAL EXCISION VULVA;  Surgeon: Everitt Amber, MD;  Location: [facility];  Service: Gynecology;  Laterality: N/A;     FAMILY HISTORY:  Family History  Problem Relation Age of Onset  . Hypertension Mother   . Diabetes Mother        insulin dependent  . Heart disease Mother   . Diabetes Father   . Heart disease Father        CAD  . Hypertension Brother   . Kidney disease Brother        kidney failure/resolved  . Colon cancer Maternal Aunt        dx'd at ~62  . Breast cancer Maternal Aunt 83     SOCIAL HISTORY:  reports that she has been smoking cigarettes. She has a 31.00 pack-year smoking history. She has never used smokeless tobacco. She reports that she does not drink alcohol and does not use  drugs.   ALLERGIES: Propoxyphene n-acetaminophen, Nabumetone, and Rofecoxib   MEDICATIONS:  Current Outpatient Medications  Medication Sig Dispense Refill  . albuterol (PROVENTIL) (2.5 MG/3ML) 0.083% nebulizer solution Take 3 mLs (2.5 mg total) by nebulization every 6 (six) hours as needed for wheezing or shortness of breath. 75 mL 1  . albuterol (VENTOLIN HFA) 108 (90 Base) MCG/ACT inhaler Inhale 2 puffs into the lungs every 6 (six) hours as needed for wheezing or shortness of breath. 18 g 3  . blood glucose meter kit and supplies KIT Dispense based on patient and insurance preference. Use up to four times daily as directed. (FOR ICD-9 250.00, 250.01). 1 each 0  . gabapentin (NEURONTIN) 300 MG  capsule Take up to 4 tabs a day (2 tabs twice a day) 120 capsule 3  . HYDROcodone-acetaminophen (NORCO) 5-325 MG tablet Take 1-2 tablets by mouth every 6 (six) hours as needed for severe pain. Sedation caution 30 tablet 0  . ibuprofen (ADVIL) 600 MG tablet Take 1 tablet (600 mg total) by mouth every 8 (eight) hours as needed (For pain.  Take with food.). 90 tablet 2  . metFORMIN (GLUCOPHAGE) 500 MG tablet Take 1 tablet (500 mg total) by mouth 2 (two) times daily with a meal. 60 tablet 3  . omeprazole (PRILOSEC OTC) 20 MG tablet Take 20 mg by mouth daily.    . prochlorperazine (COMPAZINE) 10 MG tablet Take 1 tablet (10 mg total) by mouth every 6 (six) hours as needed (Nausea or vomiting). 30 tablet 0  . ACCU-CHEK GUIDE test strip     . Accu-Chek Softclix Lancets lancets SMARTSIG:Topical     No current facility-administered medications for this encounter.     REVIEW OF SYSTEMS: On review of systems, the patient reports that she is doing well overall. She felt very well during chemotherapy and has had minimal nausea that's been manageable with antiemetics. She denies any chest pain, shortness of breath, cough, fevers, chills, night sweats, unintended weight changes. She denies any bowel or bladder disturbances, and denies abdominal pain, or vomiting. She denies any new musculoskeletal or joint aches or pains. A complete review of systems is obtained and is otherwise negative.     PHYSICAL EXAM:  Wt Readings from Last 3 Encounters:  03/28/20 151 lb 6 oz (68.7 kg)  03/27/20 150 lb 8 oz (68.3 kg)  03/20/20 150 lb 11.2 oz (68.4 kg)   Temp Readings from Last 3 Encounters:  03/28/20 (!) 97.2 F (36.2 C) (Temporal)  03/27/20 98.5 F (36.9 C) (Oral)  03/20/20 97.7 F (36.5 C) (Tympanic)   BP Readings from Last 3 Encounters:  03/28/20 (!) 147/71  03/27/20 115/63  03/20/20 (!) 144/81   Pulse Readings from Last 3 Encounters:  03/28/20 92  03/27/20 87  03/20/20 90    In general this is a  well appearing caucasian female in no acute distress. She's alert and oriented x4 and appropriate throughout the examination. Cardiopulmonary assessment is negative for acute distress and she exhibits normal effort. Bilateral breast exam is deferred.    ECOG = 0  0 - Asymptomatic (Fully active, able to carry on all predisease activities without restriction)  1 - Symptomatic but completely ambulatory (Restricted in physically strenuous activity but ambulatory and able to carry out work of a light or sedentary nature. For example, light housework, office work)  2 - Symptomatic, <50% in bed during the day (Ambulatory and capable of all self care but unable to carry out any work activities.  Up and about more than 50% of waking hours)  3 - Symptomatic, >50% in bed, but not bedbound (Capable of only limited self-care, confined to bed or chair 50% or more of waking hours)  4 - Bedbound (Completely disabled. Cannot carry on any self-care. Totally confined to bed or chair)  5 - Death   Eustace Pen MM, Creech RH, Tormey DC, et al. [phone]). "Toxicity and response criteria of the [facility]". [place] Oncol. 5 (6): 649-55    LABORATORY DATA:  Lab Results  Component Value Date   WBC 6.7 03/27/2020   HGB 14.3 03/27/2020   HCT 40.7 03/27/2020   MCV 94.9 03/27/2020   PLT 237 03/27/2020   Lab Results  Component Value Date   NA 138 03/27/2020   K 4.0 03/27/2020   CL 105 03/27/2020   CO2 23 03/27/2020   Lab Results  Component Value Date   ALT 47 (H) 03/27/2020   AST 31 03/27/2020   ALKPHOS 95 03/27/2020   BILITOT 0.3 03/27/2020      RADIOGRAPHY: No results found.     IMPRESSION/PLAN: 1. 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 Dr. Lisbeth Renshaw discusses the pathology findings and reviews the nature of left breast disease. She has done well since surgery and with completing chemotherapy.  Dr. Lisbeth Renshaw discusses the rationale for  women who undergo breast conserving surgery, that they would have benefit from external radiotherapy to the breast  to reduce risks of local recurrence followed by antiestrogen therapy. We discussed the risks, benefits, short, and long term effects of radiotherapy, as well as the curative intent, and the patient is interested in proceeding. Dr. Lisbeth Renshaw discusses the delivery and logistics of radiotherapy and anticipates a course of 4 weeks of radiotherapy to the left breast with deep inspiration breath-hold technique. Written consent is obtained and placed in the chart, a copy was provided to the patient. She will simulate on 04/17/19 and begin treatment the week of 04/24/19   In a visit lasting 60 minutes, greater than 50% of the time was spent face to face reviewing her case, as well as in preparation of, discussing, and coordinating the patient's care.  The above documentation reflects my direct findings during this shared patient visit. Please see the separate note by Dr. Lisbeth Renshaw on this date for the remainder of the patient's plan of care.    Carola Rhine, PAC

## 2020-03-28 NOTE — Addendum Note (Signed)
Encounter addended by: Hayden Pedro, PA-C on: 03/28/2020 9:48 PM  Actions taken: Level of Service modified

## 2020-03-29 ENCOUNTER — Telehealth: Payer: Self-pay | Admitting: Hematology and Oncology

## 2020-03-29 ENCOUNTER — Encounter: Payer: Self-pay | Admitting: Licensed Clinical Social Worker

## 2020-03-29 NOTE — Progress Notes (Signed)
[place] Psychosocial Distress Screening Clinical Social Work  Clinical Social Work was referred by distress screening protocol.  The patient scored a 10 on the Psychosocial Distress Thermometer which indicates severe distress. Clinical Social Worker contacted patient by phone to assess for distress and other psychosocial needs.   Patient reports that she feels better after appointment yesterday. She is happy to be finished with chemotherapy and ready to start radiation, although a little nervous as it is something new. She has other stressors with her aunt and uncle also with cancer but has great support from her husband, sister, and mom.  ONCBCN DISTRESS SCREENING 03/28/2020  Screening Type Initial Screening  Distress experienced in past week (1-10) 10  Family Problem type Partner;Other (comment)  Emotional problem type Adjusting to illness  Physical Problem type Nausea/vomiting;Loss of appetitie  Other Contact via phone    Clinical Social Worker follow up needed: No. Patient may call as needed.  If yes, follow up plan:  Lorena Benham E Camaryn Lumbert, LCSW

## 2020-03-29 NOTE — Telephone Encounter (Signed)
Called pt per 12/22 sch msg -    Scheduling Message  Entered by Heinz Knuckles L on 03/28/2020 at 2:39 PM  Priority: Routine  <No visit type provided>  Department: [facility]  Provider:  Scheduling Notes:  Please schedule pt for covid injection within the next 2 weeks. Please call pt to confirm date and time of apt.  Unable to reach pt - first dose and second dose needed. Left message for patient to call back to schedule.

## 2020-04-02 ENCOUNTER — Encounter: Payer: Self-pay | Admitting: *Deleted

## 2020-04-10 DIAGNOSIS — M5416 Radiculopathy, lumbar region: Secondary | ICD-10-CM | POA: Diagnosis not present

## 2020-04-11 ENCOUNTER — Telehealth: Payer: Self-pay | Admitting: *Deleted

## 2020-04-11 NOTE — Telephone Encounter (Signed)
Patient called stating that she may have a sinus infection and requested an antibiotic. Patient stated that she has had a cough for about a month. Patient stated that she does not have covid because she had a rapid covid test Monday and a send off test also. Patient stated that she got a call today that her send off test was negative. Patient stated that she had head congestion that is green and coughing up green mucus. Patient denies SOB, fever or difficulty breathing. Patient was offered a virtual visit with another provider which she declined stating that she only wanted to do a visit with Dr. Para March. Patient scheduled for a virtual visit with Dr. Para March Monday 01/15/20 at 4:00 pm because that is the first appointment that he has available. Patient was given ER precautions and she verbalized understanding. [facility]

## 2020-04-11 NOTE — Telephone Encounter (Signed)
Noted. Thanks.

## 2020-04-16 ENCOUNTER — Other Ambulatory Visit: Payer: Self-pay

## 2020-04-16 ENCOUNTER — Other Ambulatory Visit: Payer: Self-pay | Admitting: Family Medicine

## 2020-04-16 ENCOUNTER — Telehealth: Payer: Medicaid Other | Admitting: Family Medicine

## 2020-04-16 ENCOUNTER — Ambulatory Visit
Admission: RE | Admit: 2020-04-16 | Discharge: 2020-04-16 | Disposition: A | Discharge status: Home / Self Care | Payer: Medicaid Other | Source: Ambulatory Visit | Attending: Radiation Oncology | Admitting: Radiation Oncology

## 2020-04-16 DIAGNOSIS — C50412 Malignant neoplasm of upper-outer quadrant of left female breast: Secondary | ICD-10-CM | POA: Insufficient documentation

## 2020-04-16 DIAGNOSIS — Z510 Encounter for antineoplastic radiation therapy: Secondary | ICD-10-CM | POA: Diagnosis not present

## 2020-04-16 DIAGNOSIS — Z170 Estrogen receptor positive status [ER+]: Secondary | ICD-10-CM | POA: Insufficient documentation

## 2020-04-17 NOTE — Telephone Encounter (Signed)
Please Advise

## 2020-04-18 MED ORDER — HYDROCODONE-ACETAMINOPHEN 5-325 MG PO TABS: 1.0000 | ORAL_TABLET | Freq: Four times a day (QID) | ORAL | 0 refills | Status: DC | PRN

## 2020-04-18 NOTE — Telephone Encounter (Signed)
Sent. Thanks.   

## 2020-04-19 ENCOUNTER — Telehealth: Payer: Self-pay

## 2020-04-19 NOTE — Telephone Encounter (Signed)
RN returned call, voicemail left for return call.  

## 2020-04-23 ENCOUNTER — Encounter: Payer: Self-pay | Admitting: *Deleted

## 2020-04-23 DIAGNOSIS — Z170 Estrogen receptor positive status [ER+]: Secondary | ICD-10-CM | POA: Diagnosis not present

## 2020-04-23 DIAGNOSIS — C50412 Malignant neoplasm of upper-outer quadrant of left female breast: Secondary | ICD-10-CM | POA: Diagnosis not present

## 2020-04-23 DIAGNOSIS — Z510 Encounter for antineoplastic radiation therapy: Secondary | ICD-10-CM | POA: Diagnosis not present

## 2020-04-25 ENCOUNTER — Ambulatory Visit
Admission: RE | Admit: 2020-04-25 | Discharge: 2020-04-25 | Disposition: A | Discharge status: Home / Self Care | Payer: Medicaid Other | Source: Ambulatory Visit | Attending: Radiation Oncology | Admitting: Radiation Oncology

## 2020-04-25 ENCOUNTER — Other Ambulatory Visit: Payer: Self-pay

## 2020-04-25 DIAGNOSIS — Z510 Encounter for antineoplastic radiation therapy: Secondary | ICD-10-CM | POA: Diagnosis not present

## 2020-04-25 DIAGNOSIS — Z170 Estrogen receptor positive status [ER+]: Secondary | ICD-10-CM | POA: Diagnosis not present

## 2020-04-25 DIAGNOSIS — C50412 Malignant neoplasm of upper-outer quadrant of left female breast: Secondary | ICD-10-CM | POA: Diagnosis not present

## 2020-04-26 ENCOUNTER — Ambulatory Visit
Admission: RE | Admit: 2020-04-26 | Discharge: 2020-04-26 | Disposition: A | Discharge status: Home / Self Care | Payer: Medicaid Other | Source: Ambulatory Visit | Attending: Radiation Oncology | Admitting: Radiation Oncology

## 2020-04-26 DIAGNOSIS — C50412 Malignant neoplasm of upper-outer quadrant of left female breast: Secondary | ICD-10-CM | POA: Diagnosis not present

## 2020-04-26 DIAGNOSIS — Z170 Estrogen receptor positive status [ER+]: Secondary | ICD-10-CM | POA: Diagnosis not present

## 2020-04-26 DIAGNOSIS — Z510 Encounter for antineoplastic radiation therapy: Secondary | ICD-10-CM | POA: Diagnosis not present

## 2020-04-26 NOTE — Progress Notes (Incomplete)
Pt here for patient teaching.  Pt given Radiation and You booklet, skin care instructions, Alra deodorant and Radiaplex gel.  Reviewed areas of pertinence such as fatigue, hair loss, skin changes, breast tenderness and breast swelling . Pt able to give teach back of to pat skin and use unscented/gentle soap,apply Radiaplex bid, avoid applying anything to skin within 4 hours of treatment, avoid wearing an under wire bra and to use an electric razor if they must shave. Pt verbalizes understanding of information given and will contact nursing with any questions or concerns.     Cartier Mapel M. Aundra Espin RN, BSN      

## 2020-04-27 ENCOUNTER — Ambulatory Visit: Payer: Medicaid Other

## 2020-04-30 ENCOUNTER — Ambulatory Visit
Admission: RE | Admit: 2020-04-30 | Discharge: 2020-04-30 | Disposition: A | Discharge status: Home / Self Care | Payer: Medicaid Other | Source: Ambulatory Visit | Attending: Radiation Oncology | Admitting: Radiation Oncology

## 2020-04-30 DIAGNOSIS — Z170 Estrogen receptor positive status [ER+]: Secondary | ICD-10-CM | POA: Diagnosis not present

## 2020-04-30 DIAGNOSIS — Z510 Encounter for antineoplastic radiation therapy: Secondary | ICD-10-CM | POA: Diagnosis not present

## 2020-04-30 DIAGNOSIS — C50412 Malignant neoplasm of upper-outer quadrant of left female breast: Secondary | ICD-10-CM | POA: Diagnosis not present

## 2020-05-01 ENCOUNTER — Ambulatory Visit
Admission: RE | Admit: 2020-05-01 | Discharge: 2020-05-01 | Disposition: A | Discharge status: Home / Self Care | Payer: Medicaid Other | Source: Ambulatory Visit | Attending: Radiation Oncology | Admitting: Radiation Oncology

## 2020-05-01 DIAGNOSIS — Z510 Encounter for antineoplastic radiation therapy: Secondary | ICD-10-CM | POA: Diagnosis not present

## 2020-05-01 DIAGNOSIS — Z170 Estrogen receptor positive status [ER+]: Secondary | ICD-10-CM | POA: Diagnosis not present

## 2020-05-01 DIAGNOSIS — C50412 Malignant neoplasm of upper-outer quadrant of left female breast: Secondary | ICD-10-CM | POA: Diagnosis not present

## 2020-05-02 ENCOUNTER — Other Ambulatory Visit: Payer: Self-pay

## 2020-05-02 ENCOUNTER — Ambulatory Visit
Admission: RE | Admit: 2020-05-02 | Discharge: 2020-05-02 | Disposition: A | Discharge status: Home / Self Care | Payer: Medicaid Other | Source: Ambulatory Visit | Attending: Radiation Oncology | Admitting: Radiation Oncology

## 2020-05-02 DIAGNOSIS — Z170 Estrogen receptor positive status [ER+]: Secondary | ICD-10-CM | POA: Diagnosis not present

## 2020-05-02 DIAGNOSIS — M47816 Spondylosis without myelopathy or radiculopathy, lumbar region: Secondary | ICD-10-CM | POA: Diagnosis not present

## 2020-05-02 DIAGNOSIS — M5416 Radiculopathy, lumbar region: Secondary | ICD-10-CM | POA: Diagnosis not present

## 2020-05-02 DIAGNOSIS — C50412 Malignant neoplasm of upper-outer quadrant of left female breast: Secondary | ICD-10-CM | POA: Diagnosis not present

## 2020-05-02 DIAGNOSIS — Z510 Encounter for antineoplastic radiation therapy: Secondary | ICD-10-CM | POA: Diagnosis not present

## 2020-05-03 ENCOUNTER — Ambulatory Visit
Admission: RE | Admit: 2020-05-03 | Discharge: 2020-05-03 | Disposition: A | Discharge status: Home / Self Care | Payer: Medicaid Other | Source: Ambulatory Visit | Attending: Radiation Oncology | Admitting: Radiation Oncology

## 2020-05-03 ENCOUNTER — Other Ambulatory Visit: Payer: Self-pay

## 2020-05-03 DIAGNOSIS — Z510 Encounter for antineoplastic radiation therapy: Secondary | ICD-10-CM | POA: Diagnosis not present

## 2020-05-03 DIAGNOSIS — Z170 Estrogen receptor positive status [ER+]: Secondary | ICD-10-CM | POA: Diagnosis not present

## 2020-05-03 DIAGNOSIS — C50412 Malignant neoplasm of upper-outer quadrant of left female breast: Secondary | ICD-10-CM | POA: Diagnosis not present

## 2020-05-04 ENCOUNTER — Other Ambulatory Visit: Payer: Self-pay

## 2020-05-04 ENCOUNTER — Ambulatory Visit
Admission: RE | Admit: 2020-05-04 | Discharge: 2020-05-04 | Disposition: A | Discharge status: Home / Self Care | Payer: Medicaid Other | Source: Ambulatory Visit | Attending: Radiation Oncology | Admitting: Radiation Oncology

## 2020-05-04 DIAGNOSIS — Z170 Estrogen receptor positive status [ER+]: Secondary | ICD-10-CM | POA: Diagnosis not present

## 2020-05-04 DIAGNOSIS — C50412 Malignant neoplasm of upper-outer quadrant of left female breast: Secondary | ICD-10-CM | POA: Diagnosis not present

## 2020-05-04 DIAGNOSIS — Z510 Encounter for antineoplastic radiation therapy: Secondary | ICD-10-CM | POA: Diagnosis not present

## 2020-05-07 ENCOUNTER — Ambulatory Visit
Admission: RE | Admit: 2020-05-07 | Discharge: 2020-05-07 | Disposition: A | Discharge status: Home / Self Care | Payer: Medicaid Other | Source: Ambulatory Visit | Attending: Radiation Oncology | Admitting: Radiation Oncology

## 2020-05-07 ENCOUNTER — Other Ambulatory Visit: Payer: Self-pay

## 2020-05-07 DIAGNOSIS — Z01818 Encounter for other preprocedural examination: Secondary | ICD-10-CM | POA: Diagnosis not present

## 2020-05-07 DIAGNOSIS — C50412 Malignant neoplasm of upper-outer quadrant of left female breast: Secondary | ICD-10-CM | POA: Diagnosis not present

## 2020-05-07 DIAGNOSIS — Z170 Estrogen receptor positive status [ER+]: Secondary | ICD-10-CM | POA: Diagnosis not present

## 2020-05-07 DIAGNOSIS — Z510 Encounter for antineoplastic radiation therapy: Secondary | ICD-10-CM | POA: Diagnosis not present

## 2020-05-08 ENCOUNTER — Encounter: Payer: Self-pay | Admitting: Radiation Oncology

## 2020-05-08 ENCOUNTER — Other Ambulatory Visit: Payer: Self-pay | Admitting: Family Medicine

## 2020-05-08 ENCOUNTER — Ambulatory Visit
Admission: RE | Admit: 2020-05-08 | Discharge: 2020-05-08 | Disposition: A | Discharge status: Home / Self Care | Payer: Medicaid Other | Source: Ambulatory Visit | Attending: Radiation Oncology | Admitting: Radiation Oncology

## 2020-05-08 DIAGNOSIS — Z510 Encounter for antineoplastic radiation therapy: Secondary | ICD-10-CM | POA: Diagnosis not present

## 2020-05-08 DIAGNOSIS — Z170 Estrogen receptor positive status [ER+]: Secondary | ICD-10-CM | POA: Diagnosis not present

## 2020-05-08 DIAGNOSIS — C50412 Malignant neoplasm of upper-outer quadrant of left female breast: Secondary | ICD-10-CM | POA: Diagnosis not present

## 2020-05-09 ENCOUNTER — Ambulatory Visit
Admission: RE | Admit: 2020-05-09 | Discharge: 2020-05-09 | Disposition: A | Discharge status: Home / Self Care | Payer: Medicaid Other | Source: Ambulatory Visit | Attending: Radiation Oncology | Admitting: Radiation Oncology

## 2020-05-09 ENCOUNTER — Encounter: Payer: Self-pay | Admitting: *Deleted

## 2020-05-09 ENCOUNTER — Other Ambulatory Visit: Payer: Self-pay

## 2020-05-09 DIAGNOSIS — C50412 Malignant neoplasm of upper-outer quadrant of left female breast: Secondary | ICD-10-CM | POA: Diagnosis not present

## 2020-05-09 DIAGNOSIS — Z170 Estrogen receptor positive status [ER+]: Secondary | ICD-10-CM | POA: Diagnosis not present

## 2020-05-09 DIAGNOSIS — Z510 Encounter for antineoplastic radiation therapy: Secondary | ICD-10-CM | POA: Diagnosis not present

## 2020-05-09 MED ORDER — HYDROCODONE-ACETAMINOPHEN 5-325 MG PO TABS: 1.0000 | ORAL_TABLET | Freq: Four times a day (QID) | ORAL | 0 refills | Status: DC | PRN

## 2020-05-09 NOTE — Telephone Encounter (Signed)
Refill request for Norco 5-325 mg tabs.  LOV - 01/30/20 Next OV - not scheduled Last RF- 04/18/20 #30/0

## 2020-05-10 ENCOUNTER — Ambulatory Visit: Payer: Medicaid Other

## 2020-05-10 DIAGNOSIS — Z853 Personal history of malignant neoplasm of breast: Secondary | ICD-10-CM | POA: Diagnosis not present

## 2020-05-10 DIAGNOSIS — Z510 Encounter for antineoplastic radiation therapy: Secondary | ICD-10-CM | POA: Diagnosis not present

## 2020-05-10 DIAGNOSIS — Z170 Estrogen receptor positive status [ER+]: Secondary | ICD-10-CM | POA: Diagnosis not present

## 2020-05-10 DIAGNOSIS — C50412 Malignant neoplasm of upper-outer quadrant of left female breast: Secondary | ICD-10-CM | POA: Diagnosis not present

## 2020-05-10 DIAGNOSIS — Z452 Encounter for adjustment and management of vascular access device: Secondary | ICD-10-CM | POA: Diagnosis not present

## 2020-05-10 NOTE — Progress Notes (Signed)
  Radiation Oncology         [phone] ________________________________  Name: TERIN DIEROLF MRN: [id number]  Date: 05/08/2020  DOB: 1965-05-03  SIMULATION NOTE   NARRATIVE:  The patient underwent simulation today for ongoing radiation therapy.  The existing CT study set was employed for the purpose of virtual treatment planning.  The target and avoidance structures were reviewed and modified as necessary.  Treatment planning then occurred.  The radiation boost prescription was entered and confirmed.  A total of 3 complex treatment devices were fabricated in the form of multi-leaf collimators to shape radiation around the targets while maximally excluding nearby normal structures. I have requested : Isodose Plan.    PLAN:  This modified radiation beam arrangement is intended to continue the current radiation dose to an additional 8 Gy in 4 fractions for a total cumulative dose of 50.56 Gy.    ------------------------------------------------  Jodelle Gross, MD, PhD

## 2020-05-11 ENCOUNTER — Ambulatory Visit: Payer: Medicaid Other | Admitting: Radiation Oncology

## 2020-05-11 ENCOUNTER — Ambulatory Visit: Payer: Medicaid Other

## 2020-05-14 ENCOUNTER — Ambulatory Visit
Admission: RE | Admit: 2020-05-14 | Discharge: 2020-05-14 | Disposition: A | Discharge status: Home / Self Care | Payer: Medicaid Other | Source: Ambulatory Visit | Attending: Radiation Oncology | Admitting: Radiation Oncology

## 2020-05-14 ENCOUNTER — Other Ambulatory Visit: Payer: Self-pay

## 2020-05-14 DIAGNOSIS — C50412 Malignant neoplasm of upper-outer quadrant of left female breast: Secondary | ICD-10-CM | POA: Diagnosis not present

## 2020-05-14 DIAGNOSIS — Z170 Estrogen receptor positive status [ER+]: Secondary | ICD-10-CM | POA: Diagnosis not present

## 2020-05-14 DIAGNOSIS — Z510 Encounter for antineoplastic radiation therapy: Secondary | ICD-10-CM | POA: Diagnosis not present

## 2020-05-15 ENCOUNTER — Ambulatory Visit
Admission: RE | Admit: 2020-05-15 | Discharge: 2020-05-15 | Disposition: A | Discharge status: Home / Self Care | Payer: Medicaid Other | Source: Ambulatory Visit | Attending: Radiation Oncology | Admitting: Radiation Oncology

## 2020-05-15 ENCOUNTER — Other Ambulatory Visit: Payer: Self-pay

## 2020-05-15 DIAGNOSIS — Z170 Estrogen receptor positive status [ER+]: Secondary | ICD-10-CM | POA: Diagnosis not present

## 2020-05-15 DIAGNOSIS — Z510 Encounter for antineoplastic radiation therapy: Secondary | ICD-10-CM | POA: Diagnosis not present

## 2020-05-15 DIAGNOSIS — C50412 Malignant neoplasm of upper-outer quadrant of left female breast: Secondary | ICD-10-CM | POA: Diagnosis not present

## 2020-05-16 ENCOUNTER — Ambulatory Visit
Admission: RE | Admit: 2020-05-16 | Discharge: 2020-05-16 | Disposition: A | Discharge status: Home / Self Care | Payer: Medicaid Other | Source: Ambulatory Visit | Attending: Radiation Oncology | Admitting: Radiation Oncology

## 2020-05-16 DIAGNOSIS — Z510 Encounter for antineoplastic radiation therapy: Secondary | ICD-10-CM | POA: Diagnosis not present

## 2020-05-16 DIAGNOSIS — Z170 Estrogen receptor positive status [ER+]: Secondary | ICD-10-CM | POA: Diagnosis not present

## 2020-05-16 DIAGNOSIS — C50412 Malignant neoplasm of upper-outer quadrant of left female breast: Secondary | ICD-10-CM | POA: Diagnosis not present

## 2020-05-17 ENCOUNTER — Ambulatory Visit: Payer: Medicaid Other | Admitting: Radiation Oncology

## 2020-05-17 ENCOUNTER — Other Ambulatory Visit: Payer: Self-pay

## 2020-05-17 ENCOUNTER — Ambulatory Visit
Admission: RE | Admit: 2020-05-17 | Discharge: 2020-05-17 | Disposition: A | Discharge status: Home / Self Care | Payer: Medicaid Other | Source: Ambulatory Visit | Attending: Radiation Oncology | Admitting: Radiation Oncology

## 2020-05-17 DIAGNOSIS — Z510 Encounter for antineoplastic radiation therapy: Secondary | ICD-10-CM | POA: Diagnosis not present

## 2020-05-17 DIAGNOSIS — C50412 Malignant neoplasm of upper-outer quadrant of left female breast: Secondary | ICD-10-CM | POA: Diagnosis not present

## 2020-05-17 DIAGNOSIS — Z170 Estrogen receptor positive status [ER+]: Secondary | ICD-10-CM | POA: Diagnosis not present

## 2020-05-18 ENCOUNTER — Other Ambulatory Visit: Payer: Self-pay

## 2020-05-18 ENCOUNTER — Ambulatory Visit: Payer: Medicaid Other

## 2020-05-18 ENCOUNTER — Ambulatory Visit
Admission: RE | Admit: 2020-05-18 | Discharge: 2020-05-18 | Disposition: A | Discharge status: Home / Self Care | Payer: Medicaid Other | Source: Ambulatory Visit | Attending: Radiation Oncology | Admitting: Radiation Oncology

## 2020-05-18 ENCOUNTER — Ambulatory Visit: Payer: Medicaid Other | Admitting: Radiation Oncology

## 2020-05-18 DIAGNOSIS — C50412 Malignant neoplasm of upper-outer quadrant of left female breast: Secondary | ICD-10-CM | POA: Diagnosis not present

## 2020-05-18 DIAGNOSIS — Z170 Estrogen receptor positive status [ER+]: Secondary | ICD-10-CM | POA: Diagnosis not present

## 2020-05-18 DIAGNOSIS — Z510 Encounter for antineoplastic radiation therapy: Secondary | ICD-10-CM | POA: Diagnosis not present

## 2020-05-20 NOTE — Assessment & Plan Note (Signed)
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 T2N0 stage Ib Oncotype DX score 32: 20% risk of distant recurrence in 9 years  Treatment plan:  1. Adjuvant chemotherapy with dose dense Adriamycin and Cytoxan x4 followed by Taxol weekly x12 completed 03/27/21 2. followed by radiation therapy started 04/26/20 3. Followed by adjuvant antiestrogen therapy. ------------------------------------------------------------------------------------------------------------------------------------------------- Anastrozole counseling: We discussed the risks and benefits of anti-estrogen therapy with aromatase inhibitors. These include but not limited to insomnia, hot flashes, mood changes, vaginal dryness, bone density loss, and weight gain. We strongly believe that the benefits far outweigh the risks. Patient understands these risks and consented to starting treatment. Planned treatment duration is 7 years.   RTC in 3 months for SCP visit

## 2020-05-20 NOTE — Progress Notes (Signed)
Patient Care Team: Tonia Ghent, MD as PCP - General (Family Medicine) [name], RN as Registered Nurse [name], RN as Registered Nurse [name], RN as Oncology Nurse Navigator [name], RN as Oncology Nurse Navigator  DIAGNOSIS:    ICD-10-CM   1. Malignant neoplasm of upper-outer quadrant of left breast in female, estrogen receptor positive ([place])  C50.412    Z17.0     SUMMARY OF ONCOLOGIC HISTORY: Oncology History  Malignant neoplasm of upper-outer quadrant of left breast in female, estrogen receptor positive ([place])  08/25/2019 Initial Diagnosis   Palpable left breast mass x1 month. Diagnostic mammogram and [place] on 08/18/19 showed a 2.8cm mass at the 2 o'clock position, and one lymph node with mild cortical thickening in the left axilla. 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   10/18/2019 Cancer Staging   Staging form: Breast, AJCC 8th Edition - Pathologic stage from 10/18/2019: Stage IB (pT2, pN0(sn), cM0, G3, ER+, PR+, HER2-) - Signed by Nicholas Lose, MD on 10/18/2019   10/21/2019 Oncotype testing   32/20%   11/15/2019 -  Chemotherapy   The patient had dexamethasone (DECADRON) 4 MG tablet, 4 mg (100 % of original dose 4 mg), , , 1 of 1 cycle, Start date: 12/30/2019, End date: 01/09/2020 Dose modification: 4 mg (original dose 4 mg, Cycle 0) DOXOrubicin (ADRIAMYCIN) chemo injection 90 mg, 50 mg/m2 = 90 mg (83.3 % of original dose 60 mg/m2), Intravenous,  Once, 4 of 4 cycles Dose modification: 50 mg/m2 (original dose 60 mg/m2, Cycle 1, Reason: Provider Judgment) Administration: 90 mg (11/15/2019), 90 mg (11/29/2019), 90 mg (12/13/2019), 90 mg (12/27/2019) palonosetron (ALOXI)  injection 0.25 mg, 0.25 mg, Intravenous,  Once, 14 of 14 cycles Administration: 0.25 mg (11/15/2019), 0.25 mg (11/29/2019), 0.25 mg (12/13/2019), 0.25 mg (12/27/2019), 0.25 mg (01/17/2020), 0.25 mg (01/24/2020), 0.25 mg (01/31/2020), 0.25 mg (02/07/2020), 0.25 mg (02/14/2020), 0.25 mg (02/21/2020), 0.25 mg (02/28/2020), 0.25 mg (03/06/2020), 0.25 mg (03/13/2020) ondansetron (ZOFRAN) injection 8 mg, 8 mg (100 % of original dose 8 mg), Intravenous,  Once, 2 of 2 cycles Dose modification: 8 mg (original dose 8 mg, Cycle 15) Administration: 8 mg (03/20/2020), 8 mg (03/27/2020) pegfilgrastim-jmdb (FULPHILA) injection 6 mg, 6 mg, Subcutaneous,  Once, 4 of 4 cycles Administration: 6 mg (11/17/2019), 6 mg (12/01/2019), 6 mg (12/15/2019), 6 mg (12/29/2019) cyclophosphamide (CYTOXAN) 900 mg in sodium chloride 0.9 % 250 mL chemo infusion, 500 mg/m2 = 900 mg (83.3 % of original dose 600 mg/m2), Intravenous,  Once, 4 of 4 cycles Dose modification: 500 mg/m2 (original dose 600 mg/m2, Cycle 1, Reason: Provider Judgment) Administration: 900 mg (11/15/2019), 900 mg (11/29/2019), 900 mg (12/13/2019), 900 mg (12/27/2019) PACLitaxel (TAXOL) 144 mg in sodium chloride 0.9 % 250 mL chemo infusion (</= 63m/m2), 80 mg/m2 = 144 mg, Intravenous,  Once, 12 of 12 cycles Administration: 144 mg (01/09/2020), 144 mg (01/17/2020), 144 mg (01/24/2020), 144 mg (01/31/2020), 144 mg (02/07/2020), 144 mg (02/14/2020), 144 mg (02/21/2020), 144 mg (02/28/2020), 144 mg (03/06/2020), 144 mg (03/13/2020), 144 mg (03/20/2020), 144 mg (03/27/2020) fosaprepitant (EMEND) 150 mg in sodium chloride 0.9 % 145 mL IVPB, 150 mg, Intravenous,  Once, 4 of 4 cycles Administration: 150 mg (11/15/2019),  150 mg (11/29/2019), 150 mg (12/13/2019), 150 mg (12/27/2019)  for chemotherapy treatment.    04/26/2020 -  Radiation Therapy   Adjuvant radiation     CHIEF COMPLIANT: Follow-up of left breast cancer to discuss antiestrogen therapy  INTERVAL HISTORY: Julia [name] is a 55  y.o. with above-mentioned history of left breast cancer treated with lumpectomy, adjuvantchemotherapy, and is currently on radiation treatment. She presents to the clinic today to discuss antiestrogen therapy.   ALLERGIES:  is allergic to propoxyphene n-acetaminophen, nabumetone, and rofecoxib.  MEDICATIONS:  Current Outpatient Medications  Medication Sig Dispense Refill   ACCU-CHEK GUIDE test strip      Accu-Chek Softclix Lancets lancets SMARTSIG:Topical     albuterol (PROVENTIL) (2.5 MG/3ML) 0.083% nebulizer solution Take 3 mLs (2.5 mg total) by nebulization every 6 (six) hours as needed for wheezing or shortness of breath. 75 mL 1   albuterol (VENTOLIN HFA) 108 (90 Base) MCG/ACT inhaler Inhale 2 puffs into the lungs every 6 (six) hours as needed for wheezing or shortness of breath. 18 g 3   blood glucose meter kit and supplies KIT Dispense based on patient and insurance preference. Use up to four times daily as directed. (FOR ICD-9 250.00, 250.01). 1 each 0   gabapentin (NEURONTIN) 300 MG capsule Take up to 4 tabs a day (2 tabs twice a day) 120 capsule 3   HYDROcodone-acetaminophen (NORCO) 5-325 MG tablet Take 1-2 tablets by mouth every 6 (six) hours as needed for severe pain. Sedation caution 30 tablet 0   ibuprofen (ADVIL) 600 MG tablet Take 1 tablet (600 mg total) by mouth every 8 (eight) hours as needed (For pain.  Take with food.). 90 tablet 2   metFORMIN (GLUCOPHAGE) 500 MG tablet Take 1 tablet (500 mg total) by mouth 2 (two) times daily with a meal. 60 tablet 3   omeprazole (PRILOSEC OTC) 20 MG tablet Take 20 mg by mouth daily.     prochlorperazine (COMPAZINE) 10 MG tablet Take 1 tablet (10 mg total) by mouth every 6 (six) hours as needed (Nausea or vomiting). 30 tablet 0   No current facility-administered medications for this visit.    PHYSICAL EXAMINATION: ECOG PERFORMANCE STATUS: 1 - Symptomatic but completely ambulatory  There were no vitals filed for this  visit. There were no vitals filed for this visit.   LABORATORY DATA:  I have reviewed the data as listed CMP Latest Ref Rng & Units 03/27/2020 03/20/2020 03/13/2020  Glucose 70 - 99 mg/dL 173(H) 109(H) 168(H)  BUN 6 - 20 mg/dL 10 8 5(L)  Creatinine 0.44 - 1.00 mg/dL 0.71 0.68 0.70  Sodium 135 - 145 mmol/L 138 137 140  Potassium 3.5 - 5.1 mmol/L 4.0 4.3 3.7  Chloride 98 - 111 mmol/L 105 106 106  CO2 22 - 32 mmol/L '23 23 23  ' Calcium 8.9 - 10.3 mg/dL 9.6 9.6 9.5  Total Protein 6.5 - 8.1 g/dL 7.1 7.0 6.9  Total Bilirubin 0.3 - 1.2 mg/dL 0.3 0.5 0.4  Alkaline Phos 38 - 126 U/L 95 97 107  AST 15 - 41 U/L 31 29 37  ALT 0 - 44 U/L 47(H) 43 57(H)    Lab Results  Component Value Date   WBC 6.7 03/27/2020   HGB 14.3 03/27/2020   HCT 40.7 03/27/2020   MCV 94.9 03/27/2020   PLT 237 03/27/2020   NEUTROABS 4.4 03/27/2020    ASSESSMENT & PLAN:  Malignant neoplasm of upper-outer quadrant of left breast in female, estrogen receptor positive (  [place]) 10/07/2019:Left lumpectomy and sentinel lymph node biopsy [name]): IDC with DCIS, 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 T2N0 stage Ib Oncotype DX score 32: 20% risk of distant recurrence in 9 years  Treatment plan:  1. Adjuvant chemotherapy with dose dense Adriamycin and Cytoxan x4 followed by Taxol weekly x12 completed 03/27/21 2. followed by radiation therapy started 04/26/20 3. Followed by adjuvant antiestrogen therapy. ------------------------------------------------------------------------------------------------------------------------------------------------- Anastrozole counseling: We discussed the risks and benefits of anti-estrogen therapy with aromatase inhibitors. These include but not limited to insomnia, hot flashes, mood changes, vaginal dryness, bone density loss, and weight gain. We strongly  believe that the benefits far outweigh the risks. Patient understands these risks and consented to starting treatment. Planned treatment duration is 7 years.  Bone density has been set up  RTC in 3 months for SCP visit     No orders of the defined types were placed in this encounter.  The patient has a good understanding of the overall plan. she agrees with it. she will call with any problems that may develop before the next visit here.  Total time spent: 30 mins including face to face time and time spent for planning, charting and coordination of care  Rulon Eisenmenger, MD, MPH 05/21/2020  I, Molly Dorshimer, am acting as scribe for Dr. Nicholas Lose.  I have reviewed the above documentation for accuracy and completeness, and I agree with the above.

## 2020-05-21 ENCOUNTER — Ambulatory Visit: Payer: Medicaid Other

## 2020-05-21 ENCOUNTER — Other Ambulatory Visit: Payer: Self-pay

## 2020-05-21 ENCOUNTER — Ambulatory Visit
Admission: RE | Admit: 2020-05-21 | Discharge: 2020-05-21 | Disposition: A | Discharge status: Home / Self Care | Payer: Medicaid Other | Source: Ambulatory Visit | Attending: Radiation Oncology | Admitting: Radiation Oncology

## 2020-05-21 ENCOUNTER — Inpatient Hospital Stay: Payer: Medicaid Other | Attending: Hematology and Oncology | Admitting: Hematology and Oncology

## 2020-05-21 VITALS — BP 120/66 | HR 77 | Temp 97.5°F | Resp 17 | Ht 64.0 in | Wt 144.1 lb

## 2020-05-21 DIAGNOSIS — J45909 Unspecified asthma, uncomplicated: Secondary | ICD-10-CM | POA: Insufficient documentation

## 2020-05-21 DIAGNOSIS — Z79811 Long term (current) use of aromatase inhibitors: Secondary | ICD-10-CM | POA: Diagnosis not present

## 2020-05-21 DIAGNOSIS — Z780 Asymptomatic menopausal state: Secondary | ICD-10-CM

## 2020-05-21 DIAGNOSIS — E785 Hyperlipidemia, unspecified: Secondary | ICD-10-CM | POA: Insufficient documentation

## 2020-05-21 DIAGNOSIS — Z800 Family history of malignant neoplasm of digestive organs: Secondary | ICD-10-CM | POA: Diagnosis not present

## 2020-05-21 DIAGNOSIS — Z510 Encounter for antineoplastic radiation therapy: Secondary | ICD-10-CM | POA: Diagnosis not present

## 2020-05-21 DIAGNOSIS — L599 Disorder of the skin and subcutaneous tissue related to radiation, unspecified: Secondary | ICD-10-CM | POA: Insufficient documentation

## 2020-05-21 DIAGNOSIS — Z803 Family history of malignant neoplasm of breast: Secondary | ICD-10-CM | POA: Diagnosis not present

## 2020-05-21 DIAGNOSIS — Z9221 Personal history of antineoplastic chemotherapy: Secondary | ICD-10-CM | POA: Insufficient documentation

## 2020-05-21 DIAGNOSIS — K219 Gastro-esophageal reflux disease without esophagitis: Secondary | ICD-10-CM | POA: Diagnosis not present

## 2020-05-21 DIAGNOSIS — Z170 Estrogen receptor positive status [ER+]: Secondary | ICD-10-CM | POA: Insufficient documentation

## 2020-05-21 DIAGNOSIS — Z923 Personal history of irradiation: Secondary | ICD-10-CM | POA: Diagnosis not present

## 2020-05-21 DIAGNOSIS — C50412 Malignant neoplasm of upper-outer quadrant of left female breast: Secondary | ICD-10-CM | POA: Diagnosis not present

## 2020-05-21 DIAGNOSIS — E119 Type 2 diabetes mellitus without complications: Secondary | ICD-10-CM | POA: Diagnosis not present

## 2020-05-21 DIAGNOSIS — F17210 Nicotine dependence, cigarettes, uncomplicated: Secondary | ICD-10-CM | POA: Insufficient documentation

## 2020-05-21 DIAGNOSIS — D071 Carcinoma in situ of vulva: Secondary | ICD-10-CM | POA: Insufficient documentation

## 2020-05-21 DIAGNOSIS — J449 Chronic obstructive pulmonary disease, unspecified: Secondary | ICD-10-CM | POA: Diagnosis not present

## 2020-05-21 MED ORDER — ANASTROZOLE 1 MG PO TABS: 1.0000 mg | ORAL_TABLET | Freq: Every day | ORAL | 3 refills | Status: DC

## 2020-05-22 ENCOUNTER — Ambulatory Visit: Payer: Medicaid Other

## 2020-05-22 ENCOUNTER — Ambulatory Visit
Admission: RE | Admit: 2020-05-22 | Discharge: 2020-05-22 | Disposition: A | Discharge status: Home / Self Care | Payer: Medicaid Other | Source: Ambulatory Visit | Attending: Radiation Oncology | Admitting: Radiation Oncology

## 2020-05-22 DIAGNOSIS — C50412 Malignant neoplasm of upper-outer quadrant of left female breast: Secondary | ICD-10-CM | POA: Diagnosis not present

## 2020-05-22 DIAGNOSIS — Z170 Estrogen receptor positive status [ER+]: Secondary | ICD-10-CM | POA: Diagnosis not present

## 2020-05-22 DIAGNOSIS — Z510 Encounter for antineoplastic radiation therapy: Secondary | ICD-10-CM | POA: Diagnosis not present

## 2020-05-23 ENCOUNTER — Ambulatory Visit: Payer: Medicaid Other

## 2020-05-23 ENCOUNTER — Encounter: Payer: Self-pay | Admitting: *Deleted

## 2020-05-23 ENCOUNTER — Ambulatory Visit
Admission: RE | Admit: 2020-05-23 | Discharge: 2020-05-23 | Disposition: A | Discharge status: Home / Self Care | Payer: Medicaid Other | Source: Ambulatory Visit | Attending: Radiation Oncology | Admitting: Radiation Oncology

## 2020-05-23 DIAGNOSIS — C50412 Malignant neoplasm of upper-outer quadrant of left female breast: Secondary | ICD-10-CM | POA: Diagnosis not present

## 2020-05-23 DIAGNOSIS — Z510 Encounter for antineoplastic radiation therapy: Secondary | ICD-10-CM | POA: Diagnosis not present

## 2020-05-23 DIAGNOSIS — Z170 Estrogen receptor positive status [ER+]: Secondary | ICD-10-CM | POA: Diagnosis not present

## 2020-05-24 ENCOUNTER — Ambulatory Visit: Payer: Medicaid Other

## 2020-05-24 ENCOUNTER — Ambulatory Visit
Admission: RE | Admit: 2020-05-24 | Discharge: 2020-05-24 | Disposition: A | Discharge status: Home / Self Care | Payer: Medicaid Other | Source: Ambulatory Visit | Attending: Radiation Oncology | Admitting: Radiation Oncology

## 2020-05-24 DIAGNOSIS — Z170 Estrogen receptor positive status [ER+]: Secondary | ICD-10-CM | POA: Diagnosis not present

## 2020-05-24 DIAGNOSIS — Z510 Encounter for antineoplastic radiation therapy: Secondary | ICD-10-CM | POA: Diagnosis not present

## 2020-05-24 DIAGNOSIS — C50412 Malignant neoplasm of upper-outer quadrant of left female breast: Secondary | ICD-10-CM | POA: Diagnosis not present

## 2020-05-25 ENCOUNTER — Encounter: Payer: Self-pay | Admitting: Radiation Oncology

## 2020-05-25 ENCOUNTER — Ambulatory Visit
Admission: RE | Admit: 2020-05-25 | Discharge: 2020-05-25 | Disposition: A | Discharge status: Home / Self Care | Payer: Medicaid Other | Source: Ambulatory Visit | Attending: Radiation Oncology | Admitting: Radiation Oncology

## 2020-05-25 DIAGNOSIS — C50412 Malignant neoplasm of upper-outer quadrant of left female breast: Secondary | ICD-10-CM | POA: Diagnosis not present

## 2020-05-25 DIAGNOSIS — Z510 Encounter for antineoplastic radiation therapy: Secondary | ICD-10-CM | POA: Diagnosis not present

## 2020-05-25 DIAGNOSIS — Z170 Estrogen receptor positive status [ER+]: Secondary | ICD-10-CM | POA: Diagnosis not present

## 2020-05-28 ENCOUNTER — Encounter: Payer: Self-pay | Admitting: Radiation Oncology

## 2020-05-28 NOTE — Progress Notes (Signed)
  Patient Name: Julia [name] MRN: [id number] DOB: 04-29-1965 Referring Physician: Elsie Stain (Profile Not Attached) Date of Service: 05/25/2020 [facility], [place]                                                        End Of Treatment Note  Diagnoses: C50.412-Malignant neoplasm of upper-outer quadrant of left female breast  Cancer Staging: Stage IB, pT2N0M0 grade 3, ER/PR positive invasive ductal carcinoma of the left breast.  Intent: Curative  Radiation Treatment Dates: 04/25/2020 through 05/25/2020 Site Technique Total Dose (Gy) Dose per Fx (Gy) Completed Fx Beam Energies  Breast, Left: Breast_Lt 3D 42.56/42.56 2.66 16/16 6X  Breast, Left: Breast_Lt_Bst 3D 8/8 2 4/4 6X   Narrative: The patient tolerated radiation therapy relatively well. She had anticipated skin changes in the treatment field.  Plan: The patient will receive a call in about one month from the radiation oncology department. She will continue follow up with Dr. Lindi Adie as well.  ________________________________________________    Carola Rhine, [facility]

## 2020-05-29 ENCOUNTER — Telehealth: Payer: Self-pay | Admitting: *Deleted

## 2020-05-29 NOTE — Telephone Encounter (Signed)
Received call from pt with complaint of 3 pea sized nodules under left axilla.  Pt states nodules appeared 2 days ago and are very tender to the touch.  Pt denies redness, swelling, or recent injury.  Per MD pt needing to be seen by Wilber Bihari, NP for further evaluation.  Apt scheduled and pt verbalized understanding of date and time.  Pt also educated to take tylenol or ibuprofen as directed on the bottle and apply a warm compress to help alleviate the discomfort.

## 2020-05-30 NOTE — Assessment & Plan Note (Addendum)
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 T2N0 stage Ib Oncotype DX score 32: 20% risk of distant recurrence in 9 years  Treatment plan:  1. Adjuvant chemotherapy with dose dense Adriamycin and Cytoxan x4 followed by Taxol weekly x12 completed 03/27/21 2. followed by radiation therapy 04/26/20-05/25/2020 3. Followed by adjuvant antiestrogen therapy with Anastrozole beginning 05/25/2020 -------------------------------------------------------------------------------------------------------------------------------------------------  She is tolerating anastrozole well and will continue this.   Her physical exam is consistent with cellulitis.  No sign of an abscess.  I prescribed Augmentin.  I gave her detailed information about this in her AVS, as well as cellulitis.  She was instructed to continue to use her radiation therapy cream for her skin care.    RTC in 3 months for SCP visit  We reviewed her upcoming appointments and I explained to her and wrote out why she should have a bone density test, what it is, and what it entail.s

## 2020-05-30 NOTE — Progress Notes (Signed)
[facility] Follow up:    Julia Ghent, MD [place] [address]   DIAGNOSIS: Cancer Staging Malignant neoplasm of upper-outer quadrant of left breast in female, estrogen receptor positive ([place]) Staging form: Breast, AJCC 8th Edition - Clinical: cT2, cN0 - Unsigned - Pathologic stage from 10/18/2019: Stage IB (pT2, pN0(sn), cM0, G3, ER+, PR+, HER2-) - Signed by Nicholas Lose, MD on 10/18/2019 Stage prefix: Initial diagnosis Method of lymph node assessment: Sentinel lymph node biopsy Histologic grading system: 3 grade system   SUMMARY OF ONCOLOGIC HISTORY: Oncology History  Malignant neoplasm of upper-outer quadrant of left breast in female, estrogen receptor positive ([place])  08/25/2019 Initial Diagnosis   Palpable left breast mass x1 month. Diagnostic mammogram and [place] on 08/18/19 showed a 2.8cm mass at the 2 o'clock position, and one lymph node with mild cortical thickening in the left axilla. 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   10/18/2019 Cancer Staging   Staging form: Breast, AJCC 8th Edition - Pathologic stage from 10/18/2019: Stage IB (pT2, pN0(sn), cM0, G3, ER+, PR+, HER2-) - Signed by Nicholas Lose, MD on 10/18/2019   10/21/2019 Oncotype testing   32/20%   11/15/2019 -  Chemotherapy   The patient had dexamethasone (DECADRON) 4 MG tablet, 4 mg (100 % of original dose 4 mg), , , 1 of 1 cycle, Start date: 12/30/2019, End date: 01/09/2020 Dose modification: 4 mg (original dose 4 mg, Cycle 0) DOXOrubicin (ADRIAMYCIN) chemo injection 90 mg, 50 mg/m2 = 90 mg (83.3 % of original dose 60 mg/m2), Intravenous,  Once, 4 of 4 cycles Dose modification: 50 mg/m2 (original dose  60 mg/m2, Cycle 1, Reason: Provider Judgment) Administration: 90 mg (11/15/2019), 90 mg (11/29/2019), 90 mg (12/13/2019), 90 mg (12/27/2019) palonosetron (ALOXI) injection 0.25 mg, 0.25 mg, Intravenous,  Once, 14 of 14 cycles Administration: 0.25 mg (11/15/2019), 0.25 mg (11/29/2019), 0.25 mg (12/13/2019), 0.25 mg (12/27/2019), 0.25 mg (01/17/2020), 0.25 mg (01/24/2020), 0.25 mg (01/31/2020), 0.25 mg (02/07/2020), 0.25 mg (02/14/2020), 0.25 mg (02/21/2020), 0.25 mg (02/28/2020), 0.25 mg (03/06/2020), 0.25 mg (03/13/2020) ondansetron (ZOFRAN) injection 8 mg, 8 mg (100 % of original dose 8 mg), Intravenous,  Once, 2 of 2 cycles Dose modification: 8 mg (original dose 8 mg, Cycle 15) Administration: 8 mg (03/20/2020), 8 mg (03/27/2020) pegfilgrastim-jmdb (FULPHILA) injection 6 mg, 6 mg, Subcutaneous,  Once, 4 of 4 cycles Administration: 6 mg (11/17/2019), 6 mg (12/01/2019), 6 mg (12/15/2019), 6 mg (12/29/2019) cyclophosphamide (CYTOXAN) 900 mg in sodium chloride 0.9 % 250 mL chemo infusion, 500 mg/m2 = 900 mg (83.3 % of original dose 600 mg/m2), Intravenous,  Once, 4 of 4 cycles Dose modification: 500 mg/m2 (original dose 600 mg/m2, Cycle 1, Reason: Provider Judgment) Administration: 900 mg (11/15/2019), 900 mg (11/29/2019), 900 mg (12/13/2019), 900 mg (12/27/2019) PACLitaxel (TAXOL) 144 mg in sodium chloride 0.9 % 250 mL chemo infusion (</= 73m/m2), 80 mg/m2 = 144 mg, Intravenous,  Once, 12 of 12 cycles Administration: 144 mg (01/09/2020), 144 mg (01/17/2020), 144 mg (01/24/2020), 144 mg (01/31/2020), 144 mg (02/07/2020), 144 mg (02/14/2020), 144 mg (02/21/2020), 144 mg (02/28/2020), 144 mg (03/06/2020), 144 mg (03/13/2020), 144 mg (03/20/2020), 144 mg (03/27/2020) fosaprepitant (EMEND) 150 mg in sodium  chloride 0.9 % 145 mL IVPB, 150 mg, Intravenous,  Once, 4 of 4 cycles Administration: 150 mg (11/15/2019), 150 mg (11/29/2019), 150 mg (12/13/2019), 150 mg (12/27/2019)  for chemotherapy treatment.    04/26/2020 - 05/25/2020 Radiation  Therapy   Adjuvant radiation   05/25/2020 -  Anti-estrogen oral therapy   Anastrozole daily      CURRENT THERAPY: Anastrozole daily  INTERVAL HISTORY: Julia [name] 55 y.o. female returns for evaluation of 3 "pea sized nodules" under left axilla that began on 05/27/2020 and are tender to the touch. She has no left breast issues.  She denies fever or chills.  She completed her adjuvant radiation therapy on 05/25/20.  She did experience some skin breakdown from the radiation.  She was instructed by nursing to apply warm compresses to this area, and she has, but she notes that the area remains sore, and she has difficulty sleeping at night time due to the pain. She takes hydrocodone for the pain which helps some.  She says the pain is a throbbing pain, it is constant pain, and she moves her arm constantly at work.  She says the pain, stays around the same rate on a 8/10.  She has no swelling in her left arm.     Julia [name] started taking Anastrozole on 05/25/2020 and is tolerating it well thus far.     Patient Active Problem List   Diagnosis Date Noted  . Diabetes mellitus without complication ([place]) 95/63/8756  . Port-A-Cath in place 11/29/2019  . Malignant neoplasm of upper-outer quadrant of left breast in female, estrogen receptor positive ([place]) 09/01/2019  . Encounter for chronic pain management 07/21/2019  . Migraine headache 02/04/2018  . Sciatica, left side 07/18/2017  . Syncope 04/14/2017  . Cough 08/07/2016  . Rash 07/09/2016  . 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 Advance care planning 12/22/2014  . Plantar fasciitis 10/12/2014  . Shingles 11/14/2012  . Routine general medical examination at a health care facility 05/07/2011  . SKIN LESION 06/22/2008  . TOBACCO ABUSE 01/27/2007  . HYPERLIPIDEMIA, MIXED 12/14/2006    is allergic to propoxyphene n-acetaminophen, nabumetone, and rofecoxib.  MEDICAL HISTORY: Past Medical History:  Diagnosis Date  .  Anxiety    over cancer diagnosis  . Asthma    bronchial asthma   . Cancer ([place]) 09/2019   left breast invasive mammary cancer  . Chronic back pain   . COPD (chronic obstructive pulmonary disease) ([facility])    smokes 1 1/2 ppd  . GERD (gastroesophageal reflux disease)   . Pneumonia    hx of 2018   . Smoking     SURGICAL HISTORY: Past Surgical History:  Procedure Laterality Date  . BREAST LUMPECTOMY WITH SENTINEL LYMPH NODE BIOPSY Left 10/07/2019   Procedure: LEFT BREAST LUMPECTOMY WITH SENTINEL LYMPH NODE BX;  Surgeon: Jovita Kussmaul, MD;  Location: [facility];  Service: General;  Laterality: Left;  PEC BLOCK  . CHOLECYSTECTOMY    . PORTACATH PLACEMENT Right 11/10/2019   Procedure: INSERTION PORT-A-CATH;  Surgeon: Jovita Kussmaul, MD;  Location: [facility];  Service: General;  Laterality: Right;  . TONSILLECTOMY    . VULVECTOMY N/A 01/30/2015   Procedure: WIDE LOCAL EXCISION VULVA;  Surgeon: Everitt Amber, MD;  Location: [facility];  Service: Gynecology;  Laterality: N/A;    SOCIAL HISTORY: Social History   Socioeconomic History  . Marital status: Married    Spouse name: Not on file  . Number of children: 1  .  Years of education: 102  . Highest education level: Not on file  Occupational History  . Occupation: Scientist, water quality at [employer]  . Smoking status: Current Every Day Smoker    Packs/day: 1.00    Years: 31.00    Pack years: 31.00    Types: Cigarettes  . Smokeless tobacco: Never Used  Vaping Use  . Vaping Use: Never used  Substance and Sexual Activity  . Alcohol use: No    Alcohol/week: 0.0 standard drinks  . Drug use: No  . Sexual activity: Not on file  Other Topics Concern  . Not on file  Social History Narrative   One son   Married 2000 (had been together since 66)   Social Determinants of Radio broadcast assistant Strain: Not on file  Food Insecurity: Not on file  Transportation Needs: Not on file  Physical Activity: Not on file   Stress: Not on file  Social Connections: Not on file  Intimate Partner Violence: Not on file    FAMILY HISTORY: Family History  Problem Relation Age of Onset  . Hypertension Mother   . Diabetes Mother        insulin dependent  . Heart disease Mother   . Diabetes Father   . Heart disease Father        CAD  . Hypertension Brother   . Kidney disease Brother        kidney failure/resolved  . Colon cancer Maternal Aunt        dx'd at ~62  . Breast cancer Maternal Aunt 65    Review of Systems  Constitutional: Negative for appetite change, chills, fatigue, fever and unexpected weight change.  HENT:   Negative for hearing loss, lump/mass and trouble swallowing.   Eyes: Negative for eye problems and icterus.  Respiratory: Negative for chest tightness, cough and shortness of breath.   Cardiovascular: Negative for chest pain, leg swelling and palpitations.  Gastrointestinal: Negative for abdominal distention, abdominal pain, constipation, diarrhea, nausea and vomiting.  Endocrine: Negative for hot flashes.  Genitourinary: Negative for difficulty urinating.   Musculoskeletal: Negative for arthralgias.  Skin: Positive for rash (see above). Negative for itching.  Neurological: Negative for dizziness, extremity weakness, headaches and numbness.  Hematological: Negative for adenopathy. Does not bruise/bleed easily.  Psychiatric/Behavioral: Negative for depression. The patient is not nervous/anxious.       PHYSICAL EXAMINATION  ECOG PERFORMANCE STATUS: 1 - Symptomatic but completely ambulatory  Vitals:   05/31/20 0910  BP: (!) 112/52  Pulse: 74  Resp: 18  Temp: 97.7 F (36.5 C)  SpO2: 99%    Physical Exam Constitutional:      General: She is not in acute distress.    Appearance: Normal appearance. She is not toxic-appearing.  HENT:     Head: Normocephalic and atraumatic.  Eyes:     General: No scleral icterus. Cardiovascular:     Rate and Rhythm: Normal rate and  regular rhythm.     Pulses: Normal pulses.     Heart sounds: Normal heart sounds.  Pulmonary:     Effort: Pulmonary effort is normal.     Breath sounds: Normal breath sounds.     Comments: Right breast s/p radiation, erythema throughout, worse under left axilla, with exquisite tenderness swelling and warmth.   Abdominal:     General: Abdomen is flat. Bowel sounds are normal. There is no distension.     Palpations: Abdomen is soft.     Tenderness: There is  no abdominal tenderness.  Neurological:     Mental Status: She is alert.     LABORATORY DATA:  CBC    Component Value Date/Time   WBC 6.7 03/27/2020 1048   WBC 7.1 02/07/2020 1235   RBC 4.29 03/27/2020 1048   HGB 14.3 03/27/2020 1048   HCT 40.7 03/27/2020 1048   PLT 237 03/27/2020 1048   MCV 94.9 03/27/2020 1048   MCH 33.3 03/27/2020 1048   MCHC 35.1 03/27/2020 1048   RDW 13.2 03/27/2020 1048   LYMPHSABS 1.6 03/27/2020 1048   MONOABS 0.4 03/27/2020 1048   EOSABS 0.2 03/27/2020 1048   BASOSABS 0.1 03/27/2020 1048    CMP     Component Value Date/Time   NA 138 03/27/2020 1048   K 4.0 03/27/2020 1048   CL 105 03/27/2020 1048   CO2 23 03/27/2020 1048   GLUCOSE 173 (H) 03/27/2020 1048   BUN 10 03/27/2020 1048   CREATININE 0.71 03/27/2020 1048   CALCIUM 9.6 03/27/2020 1048   PROT 7.1 03/27/2020 1048   ALBUMIN 3.9 03/27/2020 1048   AST 31 03/27/2020 1048   ALT 47 (H) 03/27/2020 1048   ALKPHOS 95 03/27/2020 1048   BILITOT 0.3 03/27/2020 1048   GFRNONAA >60 03/27/2020 1048   GFRAA >60 01/09/2020 0748      ASSESSMENT and THERAPY PLAN:   Malignant neoplasm of upper-outer quadrant of left breast in female, estrogen receptor positive ([place]) 10/07/2019:Left lumpectomy and sentinel lymph node biopsy [name]): IDC with DCIS, 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 T2N0 stage  Ib Oncotype DX score 32: 20% risk of distant recurrence in 9 years  Treatment plan:  1. Adjuvant chemotherapy with dose dense Adriamycin and Cytoxan x4 followed by Taxol weekly x12 completed 03/27/21 2. followed by radiation therapy 04/26/20-05/25/2020 3. Followed by adjuvant antiestrogen therapy with Anastrozole beginning 05/25/2020 -------------------------------------------------------------------------------------------------------------------------------------------------  She is tolerating anastrozole well and will continue this.   Her physical exam is consistent with cellulitis.  No sign of an abscess.  I prescribed Augmentin.  I gave her detailed information about this in her AVS, as well as cellulitis.  She was instructed to continue to use her radiation therapy cream for her skin care.    RTC in 3 months for SCP visit  We reviewed her upcoming appointments and I explained to her and wrote out why she should have a bone density test, what it is, and what it entail.s       All questions were answered. The patient knows to call the clinic with any problems, questions or concerns. We can certainly see the patient much sooner if necessary.  Total encounter time: 30 minutes*  Wilber Bihari, NP 05/31/20 9:55 AM Medical Oncology and Hematology [facility] [place], [place] [address] Tel. [phone]    Fax. [phone]  *Total Encounter Time as defined by the Centers for Medicare and Medicaid Services includes, in addition to the face-to-face time of a patient visit (documented in the note above) non-face-to-face time: obtaining and reviewing outside history, ordering and reviewing medications, tests or procedures, care coordination (communications with other health care professionals or caregivers) and documentation in the medical record.

## 2020-05-31 ENCOUNTER — Encounter: Payer: Self-pay | Admitting: Adult Health

## 2020-05-31 ENCOUNTER — Other Ambulatory Visit: Payer: Self-pay

## 2020-05-31 ENCOUNTER — Inpatient Hospital Stay (HOSPITAL_BASED_OUTPATIENT_CLINIC_OR_DEPARTMENT_OTHER): Payer: Medicaid Other | Admitting: Adult Health

## 2020-05-31 VITALS — BP 112/52 | HR 74 | Temp 97.7°F | Resp 18 | Ht 64.0 in | Wt 145.6 lb

## 2020-05-31 DIAGNOSIS — C50412 Malignant neoplasm of upper-outer quadrant of left female breast: Secondary | ICD-10-CM | POA: Diagnosis not present

## 2020-05-31 DIAGNOSIS — Z170 Estrogen receptor positive status [ER+]: Secondary | ICD-10-CM

## 2020-05-31 MED ORDER — AMOXICILLIN-POT CLAVULANATE 875-125 MG PO TABS: 1.0000 | ORAL_TABLET | Freq: Two times a day (BID) | ORAL | 0 refills | Status: DC

## 2020-05-31 NOTE — Patient Instructions (Signed)
Amoxicillin; Clavulanic Acid Tablets What is this medicine? AMOXICILLIN; CLAVULANIC ACID (a mox i SIL in; KLAV yoo lan ic AS id) is a penicillin antibiotic. It treats some infections caused by bacteria. It will not work for colds, the flu, or other viruses. This medicine may be used for other purposes; ask your health care provider or pharmacist if you have questions. COMMON BRAND NAME(S): Augmentin What should I tell my health care provider before I take this medicine? They need to know if you have any of these conditions:  kidney disease  liver disease  mononucleosis  stomach or intestine problems such as colitis  an unusual or allergic reaction to amoxicillin, other penicillin or cephalosporin antibiotics, clavulanic acid, other medicines, foods, dyes, or preservatives  pregnant or trying to get pregnant  breast-feeding How should I use this medicine? Take this drug by mouth. Take it as directed on the prescription label at the same time every day. Take it with food at the start of a meal or snack. Take all of this drug unless your health care provider tells you to stop it early. Keep taking it even if you think you are better. Talk to your health care provider about the use of this drug in children. While it may be prescribed for selected conditions, precautions do apply. Overdosage: If you think you have taken too much of this medicine contact a poison control center or emergency room at once. NOTE: This medicine is only for you. Do not share this medicine with others. What if I miss a dose? If you miss a dose, take it as soon as you can. If it is almost time for your next dose, take only that dose. Do not take double or extra doses. What may interact with this medicine?  allopurinol  anticoagulants  birth control pills  methotrexate  probenecid This list may not describe all possible interactions. Give your health care provider a list of all the medicines, herbs,  non-prescription drugs, or dietary supplements you use. Also tell them if you smoke, drink alcohol, or use illegal drugs. Some items may interact with your medicine. What should I watch for while using this medicine? Tell your health care provider if your symptoms do not start to get better or if they get worse. This medicine may cause serious skin reactions. They can happen weeks to months after starting the medicine. Contact your health care provider right away if you notice fevers or flu-like symptoms with a rash. The rash may be red or purple and then turn into blisters or peeling of the skin. Or, you might notice a red rash with swelling of the face, lips or lymph nodes in your neck or under your arms. Do not treat diarrhea with over the counter products. Contact your health care provider if you have diarrhea that lasts more than 2 days or if it is severe and watery. If you have diabetes, you may get a false-positive result for sugar in your urine. Check with your health care provider. Birth control may not work properly while you are taking this medicine. Talk to your health care provider about using an extra method of birth control. What side effects may I notice from receiving this medicine? Side effects that you should report to your doctor or health care provider as soon as possible:  allergic reactions (skin rash, itching or hives; swelling of the face, lips, or tongue)  bloody or watery diarrhea  dark urine  infection (fever, chills, cough, sore   throat, or pain)  kidney injury (trouble passing urine or change in the amount of urine)  redness, blistering, peeling, or loosening of the skin, including inside the mouth  seizures  thrush (white patches in the mouth or mouth sores)  trouble breathing  unusual bruising or bleeding  unusually weak or tired Side effects that usually do not require medical attention (report to your doctor or health care provider if they continue or  are bothersome):  diarrhea  dizziness  headache  nausea, vomiting  unusual vaginal discharge, itching, or odor  upset stomach This list may not describe all possible side effects. Call your doctor for medical advice about side effects. You may report side effects to FDA at 1-800-FDA-1088. Where should I keep my medicine? Keep out of the reach of children and pets. Store at room temperature between 20 and 25 degrees C (68 and 77 degrees F). Throw away any unused drug after the expiration date. NOTE: This sheet is a summary. It may not cover all possible information. If you have questions about this medicine, talk to your doctor, pharmacist, or health care provider.  2021 Elsevier/Gold Standard (2020-02-15 09:45:03) Cellulitis, Adult  Cellulitis is a skin infection. The infected area is usually warm, red, swollen, and tender. This condition occurs most often in the arms and lower legs. The infection can travel to the muscles, blood, and underlying tissue and become serious. It is very important to get treated for this condition. What are the causes? Cellulitis is caused by bacteria. The bacteria enter through a break in the skin, such as a cut, burn, insect bite, open sore, or crack. What increases the risk? This condition is more likely to occur in people who:  Have a weak body defense system (immune system).  Have open wounds on the skin, such as cuts, burns, bites, and scrapes. Bacteria can enter the body through these open wounds.  Are older than 56 years of age.  Have diabetes.  Have a type of long-lasting (chronic) liver disease (cirrhosis) or kidney disease.  Are obese.  Have a skin condition such as: ? Itchy rash (eczema). ? Slow movement of blood in the veins (venous stasis). ? Fluid buildup below the skin (edema).  Have had radiation therapy.  Use IV drugs. What are the signs or symptoms? Symptoms of this condition include:  Redness, streaking, or spotting  on the skin.  Swollen area of the skin.  Tenderness or pain when an area of the skin is touched.  Warm skin.  A fever.  Chills.  Blisters. How is this diagnosed? This condition is diagnosed based on a medical history and physical exam. You may also have tests, including:  Blood tests.  Imaging tests. How is this treated? Treatment for this condition may include:  Medicines, such as antibiotic medicines or medicines to treat allergies (antihistamines).  Supportive care, such as rest and application of cold or warm cloths (compresses) to the skin.  Hospital care, if the condition is severe. The infection usually starts to get better within 1-2 days of treatment. Follow these instructions at home: Medicines  Take over-the-counter and prescription medicines only as told by your health care provider.  If you were prescribed an antibiotic medicine, take it as told by your health care provider. Do not stop taking the antibiotic even if you start to feel better. General instructions  Drink enough fluid to keep your urine pale yellow.  Do not touch or rub the infected area.  Raise (elevate) the  infected area above the level of your heart while you are sitting or lying down.  Apply warm or cold compresses to the affected area as told by your health care provider.  Keep all follow-up visits as told by your health care provider. This is important. These visits let your health care provider make sure a more serious infection is not developing.   Contact a health care provider if:  You have a fever.  Your symptoms do not begin to improve within 1-2 days of starting treatment.  Your bone or joint underneath the infected area becomes painful after the skin has healed.  Your infection returns in the same area or another area.  You notice a swollen bump in the infected area.  You develop new symptoms.  You have a general ill feeling (malaise) with muscle aches and pains. Get  help right away if:  Your symptoms get worse.  You feel very sleepy.  You develop vomiting or diarrhea that persists.  You notice red streaks coming from the infected area.  Your red area gets larger or turns dark in color. These symptoms may represent a serious problem that is an emergency. Do not wait to see if the symptoms will go away. Get medical help right away. Call your local emergency services (911 in the [place] Do not drive yourself to the hospital. Summary  Cellulitis is a skin infection. This condition occurs most often in the arms and lower legs.  Treatment for this condition may include medicines, such as antibiotic medicines or antihistamines.  Take over-the-counter and prescription medicines only as told by your health care provider. If you were prescribed an antibiotic medicine, do not stop taking the antibiotic even if you start to feel better.  Contact a health care provider if your symptoms do not begin to improve within 1-2 days of starting treatment or your symptoms get worse.  Keep all follow-up visits as told by your health care provider. This is important. These visits let your health care provider make sure that a more serious infection is not developing. This information is not intended to replace advice given to you by your health care provider. Make sure you discuss any questions you have with your health care provider. Document Revised: 04/04/2019 Document Reviewed: 08/13/2017 Elsevier Patient Education  [place].

## 2020-06-01 ENCOUNTER — Other Ambulatory Visit: Payer: Self-pay | Admitting: Family Medicine

## 2020-06-01 ENCOUNTER — Other Ambulatory Visit: Payer: Self-pay | Admitting: Hematology and Oncology

## 2020-06-01 DIAGNOSIS — C50412 Malignant neoplasm of upper-outer quadrant of left female breast: Secondary | ICD-10-CM

## 2020-06-01 MED ORDER — PROCHLORPERAZINE MALEATE 10 MG PO TABS: 10.0000 mg | ORAL_TABLET | Freq: Four times a day (QID) | ORAL | 0 refills | Status: DC | PRN

## 2020-06-01 NOTE — Telephone Encounter (Signed)
Duplicate request. Would not let me refuse.

## 2020-06-01 NOTE — Telephone Encounter (Signed)
Refill request for Hydocodone 5-325 mg tablets  LOV - 01/30/20 Next OV - not scheduled Last Refilled - 05/09/2020 #30/0

## 2020-06-03 MED ORDER — HYDROCODONE-ACETAMINOPHEN 5-325 MG PO TABS: 1.0000 | ORAL_TABLET | Freq: Four times a day (QID) | ORAL | 0 refills | Status: DC | PRN

## 2020-06-03 NOTE — Telephone Encounter (Signed)
Sent. Thanks.   

## 2020-06-04 ENCOUNTER — Telehealth: Payer: Self-pay | Admitting: Adult Health

## 2020-06-04 DIAGNOSIS — M5416 Radiculopathy, lumbar region: Secondary | ICD-10-CM | POA: Diagnosis not present

## 2020-06-04 NOTE — Telephone Encounter (Signed)
No 2/24 los. No changes made to pt's schedule.

## 2020-06-04 NOTE — Telephone Encounter (Signed)
Other rx was sent.  Mult requests.  Should be at pharmacy. Thanks.

## 2020-06-04 NOTE — Telephone Encounter (Signed)
Looks like this was filled but will not let me refuse.

## 2020-06-21 ENCOUNTER — Inpatient Hospital Stay: Payer: Medicaid Other | Attending: Hematology and Oncology | Admitting: Adult Health

## 2020-06-21 ENCOUNTER — Telehealth: Payer: Self-pay | Admitting: *Deleted

## 2020-06-21 ENCOUNTER — Other Ambulatory Visit: Payer: Self-pay

## 2020-06-21 ENCOUNTER — Encounter: Payer: Self-pay | Admitting: Adult Health

## 2020-06-21 ENCOUNTER — Telehealth: Payer: Self-pay

## 2020-06-21 ENCOUNTER — Other Ambulatory Visit: Payer: Self-pay | Admitting: Family Medicine

## 2020-06-21 VITALS — BP 119/64 | HR 94 | Temp 97.5°F | Resp 18 | Ht 64.0 in | Wt 140.4 lb

## 2020-06-21 DIAGNOSIS — K219 Gastro-esophageal reflux disease without esophagitis: Secondary | ICD-10-CM | POA: Diagnosis not present

## 2020-06-21 DIAGNOSIS — F17210 Nicotine dependence, cigarettes, uncomplicated: Secondary | ICD-10-CM | POA: Insufficient documentation

## 2020-06-21 DIAGNOSIS — Z79811 Long term (current) use of aromatase inhibitors: Secondary | ICD-10-CM | POA: Diagnosis not present

## 2020-06-21 DIAGNOSIS — E785 Hyperlipidemia, unspecified: Secondary | ICD-10-CM | POA: Insufficient documentation

## 2020-06-21 DIAGNOSIS — R21 Rash and other nonspecific skin eruption: Secondary | ICD-10-CM | POA: Diagnosis not present

## 2020-06-21 DIAGNOSIS — Z803 Family history of malignant neoplasm of breast: Secondary | ICD-10-CM | POA: Insufficient documentation

## 2020-06-21 DIAGNOSIS — J449 Chronic obstructive pulmonary disease, unspecified: Secondary | ICD-10-CM | POA: Insufficient documentation

## 2020-06-21 DIAGNOSIS — Z170 Estrogen receptor positive status [ER+]: Secondary | ICD-10-CM | POA: Diagnosis not present

## 2020-06-21 DIAGNOSIS — Z9221 Personal history of antineoplastic chemotherapy: Secondary | ICD-10-CM | POA: Diagnosis not present

## 2020-06-21 DIAGNOSIS — E119 Type 2 diabetes mellitus without complications: Secondary | ICD-10-CM | POA: Insufficient documentation

## 2020-06-21 DIAGNOSIS — C50412 Malignant neoplasm of upper-outer quadrant of left female breast: Secondary | ICD-10-CM | POA: Insufficient documentation

## 2020-06-21 DIAGNOSIS — Z800 Family history of malignant neoplasm of digestive organs: Secondary | ICD-10-CM | POA: Insufficient documentation

## 2020-06-21 DIAGNOSIS — D071 Carcinoma in situ of vulva: Secondary | ICD-10-CM | POA: Diagnosis not present

## 2020-06-21 DIAGNOSIS — Z923 Personal history of irradiation: Secondary | ICD-10-CM | POA: Diagnosis not present

## 2020-06-21 MED ORDER — DULOXETINE HCL 30 MG PO CPEP: 30.0000 mg | ORAL_CAPSULE | Freq: Every day | ORAL | 0 refills | Status: DC

## 2020-06-21 NOTE — Patient Instructions (Signed)
Duloxetine Delayed-Release Capsules What is this medicine? DULOXETINE (doo LOX e teen) is used to treat depression, anxiety, and different types of chronic pain. This medicine may be used for other purposes; ask your health care provider or pharmacist if you have questions. COMMON BRAND NAME(S): Cymbalta, Creig Hines, Irenka What should I tell my health care provider before I take this medicine? They need to know if you have any of these conditions:  bipolar disorder  glaucoma  high blood pressure  kidney disease  liver disease  seizures  suicidal thoughts, plans or attempt; a previous suicide attempt by you or a family member  take medicines that treat or prevent blood clots  taken medicines called MAOIs like Carbex, Eldepryl, Marplan, Nardil, and Parnate within 14 days  trouble passing urine  an unusual reaction to duloxetine, other medicines, foods, dyes, or preservatives  pregnant or trying to get pregnant  breast-feeding How should I use this medicine? Take this medicine by mouth with a glass of water. Follow the directions on the prescription label. Do not crush, cut or chew some capsules of this medicine. Some capsules may be opened and sprinkled on applesauce. Check with your doctor or pharmacist if you are not sure. You can take this medicine with or without food. Take your medicine at regular intervals. Do not take your medicine more often than directed. Do not stop taking this medicine suddenly except upon the advice of your doctor. Stopping this medicine too quickly may cause serious side effects or your condition may worsen. A special MedGuide will be given to you by the pharmacist with each prescription and refill. Be sure to read this information carefully each time. Talk to your pediatrician regarding the use of this medicine in children. While this drug may be prescribed for children as young as 55 years of age for selected conditions, precautions do  apply. Overdosage: If you think you have taken too much of this medicine contact a poison control center or emergency room at once. NOTE: This medicine is only for you. Do not share this medicine with others. What if I miss a dose? If you miss a dose, take it as soon as you can. If it is almost time for your next dose, take only that dose. Do not take double or extra doses. What may interact with this medicine? Do not take this medicine with any of the following medications:  desvenlafaxine  levomilnacipran  linezolid  MAOIs like Carbex, Eldepryl, Emsam, Marplan, Nardil, and Parnate  methylene blue (injected into a vein)  milnacipran  safinamide  thioridazine  venlafaxine  viloxazine This medicine may also interact with the following medications:  alcohol  amphetamines  aspirin and aspirin-like medicines  certain antibiotics like ciprofloxacin and enoxacin  certain medicines for blood pressure, heart disease, irregular heart beat  certain medicines for depression, anxiety, or psychotic disturbances  certain medicines for migraine headache like almotriptan, eletriptan, frovatriptan, naratriptan, rizatriptan, sumatriptan, zolmitriptan  certain medicines that treat or prevent blood clots like warfarin, enoxaparin, and dalteparin  cimetidine  fentanyl  lithium  NSAIDS, medicines for pain and inflammation, like ibuprofen or naproxen  phentermine  procarbazine  rasagiline  sibutramine  St. John's wort  theophylline  tramadol  tryptophan This list may not describe all possible interactions. Give your health care provider a list of all the medicines, herbs, non-prescription drugs, or dietary supplements you use. Also tell them if you smoke, drink alcohol, or use illegal drugs. Some items may interact with your medicine. What  should I watch for while using this medicine? Tell your doctor if your symptoms do not get better or if they get worse. Visit your  doctor or healthcare provider for regular checks on your progress. Because it may take several weeks to see the full effects of this medicine, it is important to continue your treatment as prescribed by your doctor. This medicine may cause serious skin reactions. They can happen weeks to months after starting the medicine. Contact your healthcare provider right away if you notice fevers or flu-like symptoms with a rash. The rash may be red or purple and then turn into blisters or peeling of the skin. Or, you might notice a red rash with swelling of the face, lips, or lymph nodes in your neck or under your arms. Patients and their families should watch out for new or worsening thoughts of suicide or depression. Also watch out for sudden changes in feelings such as feeling anxious, agitated, panicky, irritable, hostile, aggressive, impulsive, severely restless, overly excited and hyperactive, or not being able to sleep. If this happens, especially at the beginning of treatment or after a change in dose, call your healthcare provider. You may get drowsy or dizzy. Do not drive, use machinery, or do anything that needs mental alertness until you know how this medicine affects you. Do not stand or sit up quickly, especially if you are an older patient. This reduces the risk of dizzy or fainting spells. Alcohol may interfere with the effect of this medicine. Avoid alcoholic drinks. This medicine can cause an increase in blood pressure. This medicine can also cause a sudden drop in your blood pressure, which may make you feel faint and increase the chance of a fall. These effects are most common when you first start the medicine or when the dose is increased, or during use of other medicines that can cause a sudden drop in blood pressure. Check with your doctor for instructions on monitoring your blood pressure while taking this medicine. Your mouth may get dry. Chewing sugarless gum or sucking hard candy, and drinking  plenty of water, may help. Contact your doctor if the problem does not go away or is severe. What side effects may I notice from receiving this medicine? Side effects that you should report to your doctor or health care professional as soon as possible:  allergic reactions like skin rash, itching or hives, swelling of the face, lips, or tongue  anxious  breathing problems  confusion  changes in vision  chest pain  confusion  elevated mood, decreased need for sleep, racing thoughts, impulsive behavior  eye pain  fast, irregular heartbeat  feeling faint or lightheaded, falls  feeling agitated, angry, or irritable  hallucination, loss of contact with reality  high blood pressure  loss of balance or coordination  palpitations  redness, blistering, peeling or loosening of the skin, including inside the mouth  restlessness, pacing, inability to keep still  seizures  stiff muscles  suicidal thoughts or other mood changes  trouble passing urine or change in the amount of urine  trouble sleeping  unusual bleeding or bruising  unusually weak or tired  vomiting  yellowing of the eyes or skin Side effects that usually do not require medical attention (report to your doctor or health care professional if they continue or are bothersome):  change in sex drive or performance  change in appetite or weight  constipation  dizziness  dry mouth  headache  increased sweating  nausea  tired  This list may not describe all possible side effects. Call your doctor for medical advice about side effects. You may report side effects to FDA at 1-800-FDA-1088. Where should I keep my medicine? Keep out of the reach of children and pets. Store at room temperature between 15 and 30 degrees C (59 to 86 degrees F). Get rid of any unused medicine after the expiration date. To get rid of medicines that are no longer needed or have expired:  Take the medicine to a medicine  take-back program. Check with your pharmacy or law enforcement to find a location.  If you cannot return the medicine, check the label or package insert to see if the medicine should be thrown out in the garbage or flushed down the toilet. If you are not sure, ask your health care provider. If it is safe to put it in the trash, take the medicine out of the container. Mix the medicine with cat litter, dirt, coffee grounds, or other unwanted substance. Seal the mixture in a bag or container. Put it in the trash. NOTE: This sheet is a summary. It may not cover all possible information. If you have questions about this medicine, talk to your doctor, pharmacist, or health care provider.  2021 Elsevier/Gold Standard (2020-02-09 16:06:16)

## 2020-06-21 NOTE — Telephone Encounter (Signed)
Received call from pt with complaint of left breast swelling, pain, redness and warmth x1 week.  Pt denies recent injury or trauma.  Pt also denies any drainage or fever.  Per MD pt needing to be seen by Wilber Bihari, NP for further evaluation.  Apt scheduled and pt verbalized understanding of apt date and time.

## 2020-06-21 NOTE — Telephone Encounter (Signed)
Per Mendel Ryder ultrasound scheduled at the [facility] on 06/27/20 at 730. Appointment date and time confirmed with Pt.

## 2020-06-21 NOTE — Progress Notes (Signed)
[facility] Follow up:    Julia Ghent, MD [place] [address]   DIAGNOSIS: Cancer Staging Malignant neoplasm of upper-outer quadrant of left breast in female, estrogen receptor positive ([name]) Staging form: Breast, AJCC 8th Edition - Clinical: cT2, cN0 - Unsigned - Pathologic stage from 10/18/2019: Stage IB (pT2, pN0(sn), cM0, G3, ER+, PR+, HER2-) - Signed by Nicholas Lose, MD on 10/18/2019 Stage prefix: Initial diagnosis Method of lymph node assessment: Sentinel lymph node biopsy Histologic grading system: 3 grade system   SUMMARY OF ONCOLOGIC HISTORY: Oncology History  Malignant neoplasm of upper-outer quadrant of left breast in female, estrogen receptor positive ([place])  08/25/2019 Initial Diagnosis   Palpable left breast mass x1 month. Diagnostic mammogram and [place] on 08/18/19 showed a 2.8cm mass at the 2 o'clock position, and one lymph node with mild cortical thickening in the left axilla. 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   10/18/2019 Cancer Staging   Staging form: Breast, AJCC 8th Edition - Pathologic stage from 10/18/2019: Stage IB (pT2, pN0(sn), cM0, G3, ER+, PR+, HER2-) - Signed by Nicholas Lose, MD on 10/18/2019   10/21/2019 Oncotype testing   32/20%   11/15/2019 - 03/27/2020 Chemotherapy         04/26/2020 - 05/25/2020 Radiation Therapy   Adjuvant radiation   05/25/2020 -  Anti-estrogen oral therapy   Anastrozole daily     CURRENT THERAPY: Anastrozole  INTERVAL HISTORY: Julia [name] 55 y.o. female returns for re-evaluation of her breast pain and erythema.  She was seen on 2/24 and was just healing up from her radiation therapy.  She had  peeling, erythema, swelling, warmth and tenderness.  She was prescribed Augmentin which she took, however it improved and then worsened.  She is back for re evaluation. She says that her pain restarted yesterday.  She has not had an increase in redness.  She has not had any fever or chills.  A detailed ROS was otherwise non contributory.    Patient Active Problem List   Diagnosis Date Noted  . Diabetes mellitus without complication ([place]) [id number]  . Port-A-Cath in place 11/29/2019  . Malignant neoplasm of upper-outer quadrant of left breast in female, estrogen receptor positive ([name]) 09/01/2019  . Encounter for chronic pain management 07/21/2019  . Migraine headache 02/04/2018  . Sciatica, left side 07/18/2017  . Syncope 04/14/2017  . Cough 08/07/2016  . Rash 07/09/2016  . 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 Advance care planning 12/22/2014  . Plantar fasciitis 10/12/2014  . Shingles 11/14/2012  . Routine general medical examination at a health care facility 05/07/2011  . SKIN LESION 06/22/2008  . TOBACCO ABUSE 01/27/2007  . HYPERLIPIDEMIA, MIXED 12/14/2006    is allergic to propoxyphene n-acetaminophen, nabumetone, and rofecoxib.  MEDICAL HISTORY: Past Medical History:  Diagnosis Date  . Anxiety    over cancer diagnosis  . Asthma    bronchial asthma   . Cancer ([facility]) 09/2019   left breast invasive mammary cancer  . Chronic back pain   . COPD (chronic obstructive pulmonary disease) ([facility])    smokes 1 1/2 ppd  . GERD (gastroesophageal reflux disease)   . Pneumonia  hx of 2018   . Smoking     SURGICAL HISTORY: Past Surgical History:  Procedure Laterality Date  . BREAST LUMPECTOMY WITH SENTINEL LYMPH NODE BIOPSY Left 10/07/2019   Procedure: LEFT BREAST LUMPECTOMY WITH SENTINEL LYMPH NODE BX;  Surgeon: Jovita Kussmaul, MD;  Location: [facility];  Service: General;  Laterality: Left;  PEC BLOCK  . CHOLECYSTECTOMY    . PORTACATH  PLACEMENT Right 11/10/2019   Procedure: INSERTION PORT-A-CATH;  Surgeon: Jovita Kussmaul, MD;  Location: [facility];  Service: General;  Laterality: Right;  . TONSILLECTOMY    . VULVECTOMY N/A 01/30/2015   Procedure: WIDE LOCAL EXCISION VULVA;  Surgeon: Everitt Amber, MD;  Location: [facility];  Service: Gynecology;  Laterality: N/A;    SOCIAL HISTORY: Social History   Socioeconomic History  . Marital status: Married    Spouse name: Not on file  . Number of children: 1  . Years of education: 49  . Highest education level: Not on file  Occupational History  . Occupation: Scientist, water quality at [employer]  . Smoking status: Current Every Day Smoker    Packs/day: 1.00    Years: 31.00    Pack years: 31.00    Types: Cigarettes  . Smokeless tobacco: Never Used  Vaping Use  . Vaping Use: Never used  Substance and Sexual Activity  . Alcohol use: No    Alcohol/week: 0.0 standard drinks  . Drug use: No  . Sexual activity: Not on file  Other Topics Concern  . Not on file  Social History Narrative   One son   Married 2000 (had been together since 61)   Social Determinants of Radio broadcast assistant Strain: Not on file  Food Insecurity: Not on file  Transportation Needs: Not on file  Physical Activity: Not on file  Stress: Not on file  Social Connections: Not on file  Intimate Partner Violence: Not on file    FAMILY HISTORY: Family History  Problem Relation Age of Onset  . Hypertension Mother   . Diabetes Mother        insulin dependent  . Heart disease Mother   . Diabetes Father   . Heart disease Father        CAD  . Hypertension Brother   . Kidney disease Brother        kidney failure/resolved  . Colon cancer Maternal Aunt        dx'd at ~62  . Breast cancer Maternal Aunt 65    Review of Systems  Constitutional: Negative for appetite change, chills, fatigue, fever and unexpected weight change.  HENT:   Negative for hearing loss, lump/mass and trouble  swallowing.   Eyes: Negative for eye problems and icterus.  Respiratory: Negative for chest tightness, cough and shortness of breath.   Cardiovascular: Negative for chest pain, leg swelling and palpitations.  Gastrointestinal: Negative for abdominal distention, abdominal pain, constipation, diarrhea, nausea and vomiting.  Endocrine: Negative for hot flashes.  Genitourinary: Negative for difficulty urinating.   Musculoskeletal: Negative for arthralgias.  Skin: Negative for itching and rash.  Neurological: Negative for dizziness, extremity weakness, headaches and numbness.  Hematological: Negative for adenopathy. Does not bruise/bleed easily.  Psychiatric/Behavioral: Negative for depression. The patient is not nervous/anxious.       PHYSICAL EXAMINATION  ECOG PERFORMANCE STATUS: 1 - Symptomatic but completely ambulatory  Vitals:   06/21/20 1140  BP: 119/64  Pulse: 94  Resp: 18  Temp: (!) 97.5 F (36.4  C)  SpO2: 96%    Physical Exam Constitutional:      General: She is not in acute distress.    Appearance: Normal appearance. She is not toxic-appearing.  HENT:     Head: Normocephalic and atraumatic.  Eyes:     General: No scleral icterus. Cardiovascular:     Rate and Rhythm: Normal rate and regular rhythm.     Pulses: Normal pulses.     Heart sounds: Normal heart sounds.  Pulmonary:     Effort: Pulmonary effort is normal.     Breath sounds: Normal breath sounds.  Abdominal:     General: Abdomen is flat. Bowel sounds are normal. There is no distension.     Palpations: Abdomen is soft.     Tenderness: There is no abdominal tenderness.  Musculoskeletal:        General: No swelling.     Cervical back: Neck supple.  Lymphadenopathy:     Cervical: No cervical adenopathy.  Skin:    General: Skin is warm and dry.     Findings: No rash.  Neurological:     General: No focal deficit present.     Mental Status: She is alert.  Psychiatric:        Mood and Affect: Mood  normal.        Behavior: Behavior normal.     LABORATORY DATA:  CBC    Component Value Date/Time   WBC 6.7 03/27/2020 1048   WBC 7.1 02/07/2020 1235   RBC 4.29 03/27/2020 1048   HGB 14.3 03/27/2020 1048   HCT 40.7 03/27/2020 1048   PLT 237 03/27/2020 1048   MCV 94.9 03/27/2020 1048   MCH 33.3 03/27/2020 1048   MCHC 35.1 03/27/2020 1048   RDW 13.2 03/27/2020 1048   LYMPHSABS 1.6 03/27/2020 1048   MONOABS 0.4 03/27/2020 1048   EOSABS 0.2 03/27/2020 1048   BASOSABS 0.1 03/27/2020 1048    CMP     Component Value Date/Time   NA 138 03/27/2020 1048   K 4.0 03/27/2020 1048   CL 105 03/27/2020 1048   CO2 23 03/27/2020 1048   GLUCOSE 173 (H) 03/27/2020 1048   BUN 10 03/27/2020 1048   CREATININE 0.71 03/27/2020 1048   CALCIUM 9.6 03/27/2020 1048   PROT 7.1 03/27/2020 1048   ALBUMIN 3.9 03/27/2020 1048   AST 31 03/27/2020 1048   ALT 47 (H) 03/27/2020 1048   ALKPHOS 95 03/27/2020 1048   BILITOT 0.3 03/27/2020 1048   GFRNONAA >60 03/27/2020 1048   GFRAA >60 01/09/2020 0748        ASSESSMENT and THERAPY PLAN:   Malignant neoplasm of upper-outer quadrant of left breast in female, estrogen receptor positive ([facility]) 10/07/2019:Left lumpectomy and sentinel lymph node biopsy [name]): IDC with DCIS, 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 T2N0 stage Ib Oncotype DX score 32: 20% risk of distant recurrence in 9 years  Treatment plan:  1. Adjuvant chemotherapy with dose dense Adriamycin and Cytoxan x4 followed by Taxol weekly x12 completed 03/27/21 2. followed by radiation therapy 04/26/20-05/25/2020 3. Followed by adjuvant antiestrogen therapy with Anastrozole beginning 05/25/2020 -------------------------------------------------------------------------------------------------------------------------------------------------  She is  tolerating anastrozole well and will continue this.    She was recently diagnosed with cellulitis of the breast about 3-4 weeks ago and has since completed an antibiotic regimen.  At this point, this is likely  healing of her breast.  I do not think it is infection.  We will get an ultrasound of her breast at the breast center, refer to physical therapy for evaluation and management of breast lymphedema, and she was started on cymbalta for the pain.  I gave her detailed information in her AVS about cymbalta.  She will return in a couple of months for her SCP visit.    She knows to call me in the meantime should her breast worsen, or if she develops any other symptoms or concerns.      Orders Placed This Encounter  Procedures  . US BREAST LTD UNI LEFT INC AXILLA    Standing Status:   Future    Standing Expiration Date:   06/21/2021    Order Specific Question:   Reason for Exam (SYMPTOM  OR DIAGNOSIS REQUIRED)    Answer:   left breast pain, recent cellulitis, r/o infection    Order Specific Question:   Preferred imaging location?    Answer:   [facility]  . Ambulatory referral to Physical Therapy    Referral Priority:   Routine    Referral Type:   Physical Medicine    Referral Reason:   Specialty Services Required    Requested Specialty:   Physical Therapy    Number of Visits Requested:   1    All questions were answered. The patient knows to call the clinic with any problems, questions or concerns. We can certainly see the patient much sooner if necessary.  Total encounter time: 20 minutes*  Wilber Bihari, NP 06/21/20 12:49 PM Medical Oncology and Hematology [facility] [place], [place] [address] Tel. [phone]    Fax. [phone]  *Total Encounter Time as defined by the Centers for Medicare and Medicaid Services includes, in addition to the face-to-face time of a patient visit (documented in the note above) non-face-to-face time: obtaining and  reviewing outside history, ordering and reviewing medications, tests or procedures, care coordination (communications with other health care professionals or caregivers) and documentation in the medical record.

## 2020-06-21 NOTE — Assessment & Plan Note (Signed)
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 T2N0 stage Ib Oncotype DX score 32: 20% risk of distant recurrence in 9 years  Treatment plan:  1. Adjuvant chemotherapy with dose dense Adriamycin and Cytoxan x4 followed by Taxol weekly x12 completed 03/27/21 2. followed by radiation therapy 04/26/20-05/25/2020 3. Followed by adjuvant antiestrogen therapy with Anastrozole beginning 05/25/2020 -------------------------------------------------------------------------------------------------------------------------------------------------  She is tolerating anastrozole well and will continue this.    She was recently diagnosed with cellulitis of the breast about 3-4 weeks ago and has since completed an antibiotic regimen.  At this point, this is likely healing of her breast.  I do not think it is infection.  We will get an ultrasound of her breast at the breast center, refer to physical therapy for evaluation and management of breast lymphedema, and she was started on cymbalta for the pain.  I gave her detailed information in her AVS about cymbalta.  She will return in a couple of months for her SCP visit.    She knows to call me in the meantime should her breast worsen, or if she develops any other symptoms or concerns.

## 2020-06-22 ENCOUNTER — Other Ambulatory Visit: Payer: Self-pay | Admitting: Family Medicine

## 2020-06-22 NOTE — Telephone Encounter (Signed)
Refill request for Norco 5-325 mg tablets  LOV - 01/30/20 Next OV - not scheduled Last refill - 06/03/20 #30/0

## 2020-06-24 MED ORDER — HYDROCODONE-ACETAMINOPHEN 5-325 MG PO TABS: 1.0000 | ORAL_TABLET | Freq: Four times a day (QID) | ORAL | 0 refills | Status: DC | PRN

## 2020-06-24 NOTE — Telephone Encounter (Signed)
Sent. Thanks.   

## 2020-06-25 NOTE — Telephone Encounter (Signed)
Duplicate request for hydrocodone; will not let me deny request.

## 2020-06-26 NOTE — Telephone Encounter (Signed)
Cancelled.  Thanks.

## 2020-06-27 ENCOUNTER — Ambulatory Visit
Admission: RE | Admit: 2020-06-27 | Discharge: 2020-06-27 | Disposition: A | Discharge status: Home / Self Care | Payer: Medicaid Other | Source: Ambulatory Visit | Attending: Adult Health | Admitting: Adult Health

## 2020-06-27 ENCOUNTER — Other Ambulatory Visit: Payer: Self-pay | Admitting: Adult Health

## 2020-06-27 ENCOUNTER — Other Ambulatory Visit: Payer: Self-pay

## 2020-06-27 DIAGNOSIS — C50412 Malignant neoplasm of upper-outer quadrant of left female breast: Secondary | ICD-10-CM

## 2020-06-27 DIAGNOSIS — Z170 Estrogen receptor positive status [ER+]: Secondary | ICD-10-CM

## 2020-06-27 DIAGNOSIS — R922 Inconclusive mammogram: Secondary | ICD-10-CM | POA: Diagnosis not present

## 2020-06-27 HISTORY — DX: Personal history of antineoplastic chemotherapy: Z92.21

## 2020-06-27 HISTORY — DX: Personal history of irradiation: Z92.3

## 2020-07-08 ENCOUNTER — Other Ambulatory Visit: Payer: Self-pay | Admitting: Hematology and Oncology

## 2020-07-13 ENCOUNTER — Other Ambulatory Visit: Payer: Self-pay | Admitting: Adult Health

## 2020-07-13 DIAGNOSIS — Z170 Estrogen receptor positive status [ER+]: Secondary | ICD-10-CM

## 2020-07-13 DIAGNOSIS — C50412 Malignant neoplasm of upper-outer quadrant of left female breast: Secondary | ICD-10-CM

## 2020-07-23 ENCOUNTER — Other Ambulatory Visit: Payer: Self-pay | Admitting: Family Medicine

## 2020-07-23 MED ORDER — HYDROCODONE-ACETAMINOPHEN 5-325 MG PO TABS
1.0000 | ORAL_TABLET | Freq: Four times a day (QID) | ORAL | 0 refills | Status: DC | PRN
Start: 2020-07-23 — End: 2020-08-14

## 2020-07-23 NOTE — Telephone Encounter (Signed)
Last office visit 01/30/2020 for DM & Sciatica.  Last refilled 06/24/2020 for #30 with no refills.  UDS/Contract 02/24/2020.  No future appointments with PCP.

## 2020-08-01 ENCOUNTER — Other Ambulatory Visit: Payer: Self-pay | Admitting: *Deleted

## 2020-08-01 DIAGNOSIS — C50412 Malignant neoplasm of upper-outer quadrant of left female breast: Secondary | ICD-10-CM

## 2020-08-01 NOTE — Progress Notes (Signed)
Received call from pt with complaint of nausea, vomiting, and decrease in appetite since December 2021. Pt reports two to three episodes a week of severe nausea and vomiting with unknown cause.  Pt states she is starting to feel generalized fatigue and is requesting to be seen by MD for further evaluation.  Per MD pt needing lab apt and f/u. Apt scheduled and pt verbalized understanding.

## 2020-08-02 NOTE — Progress Notes (Signed)
Patient Care Team: Tonia Ghent, MD as PCP - General (Family Medicine) [name], RN as Registered Nurse [name], RN as Registered Nurse [name], RN as Oncology Nurse Navigator [name], RN as Oncology Nurse Navigator  DIAGNOSIS:    ICD-10-CM   1. Malignant neoplasm of upper-outer quadrant of left breast in female, estrogen receptor positive ([place])  C50.412    Z17.0     SUMMARY OF ONCOLOGIC HISTORY: Oncology History  Malignant neoplasm of upper-outer quadrant of left breast in female, estrogen receptor positive ([place])  08/25/2019 Initial Diagnosis   Palpable left breast mass x1 month. Diagnostic mammogram and [place] on 08/18/19 showed a 2.8cm mass at the 2 o'clock position, and one lymph node with mild cortical thickening in the left axilla. 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   10/18/2019 Cancer Staging   Staging form: Breast, AJCC 8th Edition - Pathologic stage from 10/18/2019: Stage IB (pT2, pN0(sn), cM0, G3, ER+, PR+, HER2-) - Signed by Nicholas Lose, MD on 10/18/2019   10/21/2019 Oncotype testing   32/20%   11/15/2019 - 03/27/2020 Chemotherapy         04/26/2020 - 05/25/2020 Radiation Therapy   Adjuvant radiation   05/25/2020 -  Anti-estrogen oral therapy   Anastrozole daily     CHIEF COMPLIANT: Follow-up of left breast cancer to discuss antiestrogen therapy  INTERVAL HISTORY: Julia [name] is a 55 y.o. with above-mentioned history of left breast cancer treated with lumpectomy, adjuvantchemotherapy, radiation, and is currently on antiestrogen therapy with anastrozole. Mammogram on 06/27/20 showed no evidence of malignancy. She presents to the clinic today to discuss  antiestrogen therapy.    ALLERGIES:  is allergic to propoxyphene n-acetaminophen, nabumetone, and rofecoxib.  MEDICATIONS:  Current Outpatient Medications  Medication Sig Dispense Refill  . Accu-Chek Softclix Lancets lancets SMARTSIG:Topical    . albuterol (PROVENTIL) (2.5 MG/3ML) 0.083% nebulizer solution Take 3 mLs (2.5 mg total) by nebulization every 6 (six) hours as needed for wheezing or shortness of breath. 75 mL 1  . albuterol (VENTOLIN HFA) 108 (90 Base) MCG/ACT inhaler Inhale 2 puffs into the lungs every 6 (six) hours as needed for wheezing or shortness of breath. 18 g 3  . anastrozole (ARIMIDEX) 1 MG tablet Take 1 tablet (1 mg total) by mouth daily. 90 tablet 3  . blood glucose meter kit and supplies KIT Dispense based on patient and insurance preference. Use up to four times daily as directed. (FOR ICD-9 250.00, 250.01). 1 each 0  . DULoxetine (CYMBALTA) 30 MG capsule TAKE 1 CAPSULE (30 MG TOTAL) BY MOUTH DAILY. START DAILY X 2 WEEKS, THEN INCREASE TO 2 CAPS DAILY 180 capsule 1  . gabapentin (NEURONTIN) 300 MG capsule Take up to 4 tabs a day (2 tabs twice a day) 120 capsule 3  . glucose blood (ACCU-CHEK GUIDE) test strip USE TO TEST BLOOD SUGAR UP TO 4 TIMES A DAY AS DIRECTED 100 strip 3  . HYDROcodone-acetaminophen (NORCO) 5-325 MG tablet Take 1-2 tablets by mouth every 6 (six) hours as needed for severe pain. Sedation caution 30 tablet 0  . ibuprofen (ADVIL) 600 MG tablet Take 1 tablet (600 mg total) by mouth every 8 (eight)  hours as needed (For pain.  Take with food.). 90 tablet 2  . metFORMIN (GLUCOPHAGE) 500 MG tablet TAKE 1 TABLET BY MOUTH TWICE DAILY WITH MEAL 180 tablet 3  . omeprazole (PRILOSEC OTC) 20 MG tablet Take 20 mg by mouth daily.     No current facility-administered medications for this visit.    PHYSICAL EXAMINATION:  Breast exam: No evidence of cellulitis or lymphedema of the breast.  There is tenderness in the axilla and scar tissue.  ECOG PERFORMANCE STATUS:  1 - Symptomatic but completely ambulatory  Vitals:   08/03/20 1043  BP: (!) 116/55  Pulse: 78  Resp: 16  Temp: (!) 97.5 F (36.4 C)  SpO2: 100%   Filed Weights   08/03/20 1043  Weight: 142 lb 9.6 oz (64.7 kg)      LABORATORY DATA:  I have reviewed the data as listed CMP Latest Ref Rng & Units 08/03/2020 03/27/2020 03/20/2020  Glucose 70 - 99 mg/dL 114(H) 173(H) 109(H)  BUN 6 - 20 mg/dL '7 10 8  ' Creatinine 0.44 - 1.00 mg/dL 0.67 0.71 0.68  Sodium 135 - 145 mmol/L 140 138 137  Potassium 3.5 - 5.1 mmol/L 4.1 4.0 4.3  Chloride 98 - 111 mmol/L 106 105 106  CO2 22 - 32 mmol/L '24 23 23  ' Calcium 8.9 - 10.3 mg/dL 9.6 9.6 9.6  Total Protein 6.5 - 8.1 g/dL 6.9 7.1 7.0  Total Bilirubin 0.3 - 1.2 mg/dL 0.5 0.3 0.5  Alkaline Phos 38 - 126 U/L 91 95 97  AST 15 - 41 U/L '21 31 29  ' ALT 0 - 44 U/L 25 47(H) 43    Lab Results  Component Value Date   WBC 9.8 08/03/2020   HGB 15.9 (H) 08/03/2020   HCT 45.6 08/03/2020   MCV 92.1 08/03/2020   PLT 245 08/03/2020   NEUTROABS 7.3 08/03/2020    ASSESSMENT & PLAN:  Malignant neoplasm of upper-outer quadrant of left breast in female, estrogen receptor positive ([place]) 10/07/2019:Left lumpectomy and sentinel lymph node biopsy [name]): IDC with DCIS, 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 T2N0 stage Ib Oncotype DX score 32: 20% risk of distant recurrence in 9 years  Treatment plan:  1. Adjuvant chemotherapy with dose dense Adriamycin and Cytoxan x4 followed by Taxol weekly x12 completed 03/27/21 2. followed by radiation therapy started 04/26/20 3. Followed by adjuvant antiestrogen therapy. ------------------------------------------------------------------------------------------------------------------------------------------------- Anastrozole toxicities: She is tolerating it extremely well. Nausea issues:  I discussed with her about stopping Cymbalta this was the only other medication that was started simultaneous to anastrozole.  I do not think it is related to anastrozole.  However if it is related to anastrozole, then we can talk about switching her to letrozole.  Left breast cellulitis: March 2022: Work-up with ultrasound performed.  Physical therapy evaluated for lymphedema.   Breast cancer surveillance: Mammogram 06/27/2020 left breast: Postsurgical changes no evidence of malignancy or infection.  Bilateral mammogram to be done in May 2022 She has an appointment for survivorship next month with Mendel Ryder.   No orders of the defined types were placed in this encounter.  The patient has a good understanding of the overall plan. she agrees with it. she will call with any problems that may develop before the next visit here.  Total time spent: 20 mins including face to face time and time spent for planning,  charting and coordination of care  Rulon Eisenmenger, MD, MPH 08/03/2020  I, Molly Dorshimer, am acting as scribe for Dr. Nicholas Lose.  I have reviewed the above documentation for accuracy and completeness, and I agree with the above.

## 2020-08-03 ENCOUNTER — Inpatient Hospital Stay: Payer: Medicaid Other | Attending: Hematology and Oncology

## 2020-08-03 ENCOUNTER — Inpatient Hospital Stay (HOSPITAL_BASED_OUTPATIENT_CLINIC_OR_DEPARTMENT_OTHER): Payer: Medicaid Other | Admitting: Hematology and Oncology

## 2020-08-03 ENCOUNTER — Other Ambulatory Visit: Payer: Self-pay

## 2020-08-03 DIAGNOSIS — Z9221 Personal history of antineoplastic chemotherapy: Secondary | ICD-10-CM | POA: Diagnosis not present

## 2020-08-03 DIAGNOSIS — C773 Secondary and unspecified malignant neoplasm of axilla and upper limb lymph nodes: Secondary | ICD-10-CM | POA: Insufficient documentation

## 2020-08-03 DIAGNOSIS — N61 Mastitis without abscess: Secondary | ICD-10-CM | POA: Diagnosis not present

## 2020-08-03 DIAGNOSIS — Z79811 Long term (current) use of aromatase inhibitors: Secondary | ICD-10-CM | POA: Diagnosis not present

## 2020-08-03 DIAGNOSIS — Z79899 Other long term (current) drug therapy: Secondary | ICD-10-CM | POA: Insufficient documentation

## 2020-08-03 DIAGNOSIS — Z923 Personal history of irradiation: Secondary | ICD-10-CM | POA: Insufficient documentation

## 2020-08-03 DIAGNOSIS — Z791 Long term (current) use of non-steroidal anti-inflammatories (NSAID): Secondary | ICD-10-CM | POA: Diagnosis not present

## 2020-08-03 DIAGNOSIS — Z170 Estrogen receptor positive status [ER+]: Secondary | ICD-10-CM

## 2020-08-03 DIAGNOSIS — C50412 Malignant neoplasm of upper-outer quadrant of left female breast: Secondary | ICD-10-CM | POA: Insufficient documentation

## 2020-08-03 DIAGNOSIS — Z7984 Long term (current) use of oral hypoglycemic drugs: Secondary | ICD-10-CM | POA: Insufficient documentation

## 2020-08-03 LAB — CMP (CANCER CENTER ONLY)
ALT: 25 U/L (ref 0–44)
AST: 21 U/L (ref 15–41)
Albumin: 4 g/dL (ref 3.5–5.0)
Alkaline Phosphatase: 91 U/L (ref 38–126)
Anion gap: 10 (ref 5–15)
BUN: 7 mg/dL (ref 6–20)
CO2: 24 mmol/L (ref 22–32)
Calcium: 9.6 mg/dL (ref 8.9–10.3)
Chloride: 106 mmol/L (ref 98–111)
Creatinine: 0.67 mg/dL (ref 0.44–1.00)
GFR, Estimated: >60 mL/min (ref >60)
Glucose, Bld: 114 mg/dL — ABNORMAL HIGH (ref 70–99)
Potassium: 4.1 mmol/L (ref 3.5–5.1)
Sodium: 140 mmol/L (ref 135–145)
Total Bilirubin: 0.5 mg/dL (ref 0.3–1.2)
Total Protein: 6.9 g/dL (ref 6.5–8.1)

## 2020-08-03 LAB — CBC WITH DIFFERENTIAL (CANCER CENTER ONLY)
Abs Immature Granulocytes: 0.03 10*3/uL (ref 0.00–0.07)
Basophils Absolute: 0 10*3/uL (ref 0.0–0.1)
Basophils Relative: 0 %
Eosinophils Absolute: 0.2 10*3/uL (ref 0.0–0.5)
Eosinophils Relative: 2 %
HCT: 45.6 % (ref 36.0–46.0)
Hemoglobin: 15.9 g/dL — ABNORMAL HIGH (ref 12.0–15.0)
Immature Granulocytes: 0 %
Lymphocytes Relative: 17 %
Lymphs Abs: 1.6 10*3/uL (ref 0.7–4.0)
MCH: 32.1 pg (ref 26.0–34.0)
MCHC: 34.9 g/dL (ref 30.0–36.0)
MCV: 92.1 fL (ref 80.0–100.0)
Monocytes Absolute: 0.6 10*3/uL (ref 0.1–1.0)
Monocytes Relative: 6 %
Neutro Abs: 7.3 10*3/uL (ref 1.7–7.7)
Neutrophils Relative %: 75 %
Platelet Count: 245 10*3/uL (ref 150–400)
RBC: 4.95 MIL/uL (ref 3.87–5.11)
RDW: 13.3 % (ref 11.5–15.5)
WBC Count: 9.8 10*3/uL (ref 4.0–10.5)
nRBC: 0 % (ref 0.0–0.2)

## 2020-08-03 LAB — MAGNESIUM: Magnesium: 1.9 mg/dL (ref 1.7–2.4)

## 2020-08-03 NOTE — Assessment & Plan Note (Signed)
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 T2N0 stage Ib Oncotype DX score 32: 20% risk of distant recurrence in 9 years  Treatment plan:  1. Adjuvant chemotherapy with dose dense Adriamycin and Cytoxan x4 followed by Taxol weekly x12 completed 03/27/21 2. followed by radiation therapy started 04/26/20 3. Followed by adjuvant antiestrogen therapy. ------------------------------------------------------------------------------------------------------------------------------------------------- Anastrozole toxicities: She is tolerating it extremely well. Left breast cellulitis: March 2022: Work-up with ultrasound (performed.  Physical therapy evaluated for lymphedema.  Currently on Cymbalta for pain.  Breast cancer surveillance: Mammogram 06/27/2020 left breast: Postsurgical changes no evidence of malignancy or infection.  Bilateral mammogram to be done in May 2022

## 2020-08-13 ENCOUNTER — Encounter: Payer: Self-pay | Admitting: *Deleted

## 2020-08-13 ENCOUNTER — Ambulatory Visit
Admission: RE | Admit: 2020-08-13 | Discharge: 2020-08-13 | Disposition: A | Discharge status: Home / Self Care | Payer: Medicaid Other | Source: Ambulatory Visit | Attending: Radiation Oncology | Admitting: Radiation Oncology

## 2020-08-13 ENCOUNTER — Telehealth (INDEPENDENT_AMBULATORY_CARE_PROVIDER_SITE_OTHER): Payer: Medicaid Other | Admitting: Family Medicine

## 2020-08-13 ENCOUNTER — Other Ambulatory Visit: Payer: Self-pay

## 2020-08-13 ENCOUNTER — Encounter: Payer: Self-pay | Admitting: Family Medicine

## 2020-08-13 ENCOUNTER — Telehealth: Payer: Self-pay | Admitting: Family Medicine

## 2020-08-13 VITALS — Temp 97.5°F

## 2020-08-13 DIAGNOSIS — Z170 Estrogen receptor positive status [ER+]: Secondary | ICD-10-CM | POA: Insufficient documentation

## 2020-08-13 DIAGNOSIS — C50412 Malignant neoplasm of upper-outer quadrant of left female breast: Secondary | ICD-10-CM | POA: Insufficient documentation

## 2020-08-13 DIAGNOSIS — K529 Noninfective gastroenteritis and colitis, unspecified: Secondary | ICD-10-CM | POA: Diagnosis not present

## 2020-08-13 NOTE — Telephone Encounter (Signed)
Letter resent to patient thru mychart. Left message on voicemail for patient to call the office back.

## 2020-08-13 NOTE — Progress Notes (Signed)
I connected with Julia [name] on 08/13/20 at 12:00 PM EDT by video and verified that I am speaking with the correct person using two identifiers.   I discussed the limitations, risks, security and privacy concerns of performing an evaluation and management service by video and the availability of in person appointments. I also discussed with the patient that there may be a patient responsible charge related to this service. The patient expressed understanding and agreed to proceed.  Patient location: Home Provider Location: [facility] [place] Participants: Lesleigh Noe and [name]   Subjective:     Julia [name] is a 55 y.o. female presenting for Emesis (X 3 days... Intermittent) and Diarrhea (Denies abd pain or blood in stool... denies sick contacts...)     Emesis  This is a new problem. Episode onset: 08/10/2020. The problem occurs less than 2 times per day. The problem has been gradually improving. The emesis has an appearance of stomach contents. There has been no fever. Associated symptoms include diarrhea. Pertinent negatives include no abdominal pain, chest pain, chills, fever, myalgias or sweats. Risk factors: no sick contact, no food triggers. She has tried bed rest for the symptoms.  Diarrhea  The problem occurs 2 to 4 times per day. Associated symptoms include vomiting. Pertinent negatives include no abdominal pain, chills, fever, myalgias or sweats. There are no known risk factors.   No muscle aches or pains Thought it might be the flu at first  Intake: eating regular food - yesterday was fine but today had symptoms - burped this morning and had vomiting  Did get the covid vaccine Jan 2022, no booster  Started Anastrozole in December - and had been feeling fine  Took a covid test at work this morning and that was negative - rapid test  Review of Systems  Constitutional: Negative for chills and fever.  HENT: Positive for congestion.   Cardiovascular:  Negative for chest pain.  Gastrointestinal: Positive for diarrhea and vomiting. Negative for abdominal pain.  Musculoskeletal: Negative for myalgias.  Allergic/Immunologic: Positive for environmental allergies.     Social History   Tobacco Use  Smoking Status Current Every Day Smoker  . Packs/day: 1.00  . Years: 31.00  . Pack years: 31.00  . Types: Cigarettes  Smokeless Tobacco Never Used        Objective:   BP Readings from Last 3 Encounters:  08/03/20 (!) 116/55  06/21/20 119/64  05/31/20 (!) 112/52   Wt Readings from Last 3 Encounters:  08/03/20 142 lb 9.6 oz (64.7 kg)  06/21/20 140 lb 6.4 oz (63.7 kg)  05/31/20 145 lb 9.6 oz (66 kg)    Temp (!) 97.5 F (36.4 C) (Temporal)    Physical Exam Constitutional:      Appearance: Normal appearance. She is not ill-appearing.  HENT:     Head: Normocephalic and atraumatic.     Right Ear: External ear normal.     Left Ear: External ear normal.  Eyes:     Conjunctiva/sclera: Conjunctivae normal.  Pulmonary:     Effort: Pulmonary effort is normal. No respiratory distress.     Comments: Smoking a cigarette  Neurological:     Mental Status: She is alert. Mental status is at baseline.  Psychiatric:        Mood and Affect: Mood normal.        Behavior: Behavior normal.        Thought Content: Thought content normal.  Judgment: Judgment normal.            Assessment & Plan:   Problem List Items Addressed This Visit   None   Visit Diagnoses    Gastroenteritis    -  Primary     Discussed it could be covid and offered afternoon testing - she had a negative rapid in the morning. She will wait  Likely viral gastroenteritis Hydration and bland diet ER precautions if worsening   Work note to return as early as Wednesday   Return if symptoms worsen or fail to improve.  Lesleigh Noe, MD

## 2020-08-13 NOTE — Progress Notes (Signed)
  Radiation Oncology         [phone] ________________________________  Name: Julia [name] MRN: [id number]  Date of Service: 08/13/2020  DOB: 1965/12/08  Post Treatment Telephone Note  Diagnosis: Stage IB, pT2N0M0 grade 3, ER/PR positive invasive ductal carcinoma of the left breast.  Interval Since Last Radiation:  8 weeks   04/25/2020 through 05/25/2020 Site Technique Total Dose (Gy) Dose per Fx (Gy) Completed Fx Beam Energies  Breast, Left: Breast_Lt 3D 42.56/42.56 2.66 16/16 6X  Breast, Left: Breast_Lt_Bst 3D 8/8 2 4/4 6X     Narrative:  The patient was contacted today for routine follow-up. During treatment she did very well with radiotherapy and did not have significant desquamation. She reports she is dealing with a GI bug with nausea and vomiting and had a virtual visit with her PCP today. Her skin however from her prior breast treatment is healed and back to normal appearance per report.   Impression/Plan: 1. 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 The patient has been doing well since completion of radiotherapy. We discussed that we would be happy to continue to follow her as needed, but she will also continue to follow up with Dr. Lindi Adie in medical oncology. She was counseled on skin care as well as measures to avoid sun exposure to this area.  2. Survivorship. We discussed the importance of survivorship evaluation and encouraged her to attend her upcoming visit with that clinic.     Carola Rhine, PAC

## 2020-08-13 NOTE — Telephone Encounter (Signed)
Pt called in due to she was seen today virtutally but the doctors note is not letting her screenshot the letter, wanted to know what can be done so she can send it to her employer.   Please advise

## 2020-08-13 NOTE — Patient Instructions (Signed)
Bland diet Lots of water  Pay attention for dizziness, lightheadedness inability to keep down water -- go to the ER

## 2020-08-14 MED ORDER — HYDROCODONE-ACETAMINOPHEN 5-325 MG PO TABS: 1.0000 | ORAL_TABLET | Freq: Four times a day (QID) | ORAL | 0 refills | Status: DC | PRN

## 2020-08-14 NOTE — Addendum Note (Signed)
Addended by: Tonia Ghent on: 08/14/2020 07:58 AM   Modules accepted: Orders

## 2020-08-14 NOTE — Telephone Encounter (Signed)
Sent. Thanks.  Needs DM2 f/u with labs at the visit.  Doesn't need to fast.  Please schedule.

## 2020-08-15 ENCOUNTER — Other Ambulatory Visit: Payer: Self-pay | Admitting: *Deleted

## 2020-08-15 ENCOUNTER — Encounter: Payer: Self-pay | Admitting: *Deleted

## 2020-08-15 DIAGNOSIS — Z170 Estrogen receptor positive status [ER+]: Secondary | ICD-10-CM

## 2020-08-17 ENCOUNTER — Telehealth: Payer: Self-pay | Admitting: Adult Health

## 2020-08-17 ENCOUNTER — Inpatient Hospital Stay: Payer: Medicaid Other | Admitting: Adult Health

## 2020-08-17 NOTE — Telephone Encounter (Signed)
R/s appt per 5/13 sch msg. Called pt, no answer. Left msg with appt date and time.

## 2020-08-22 ENCOUNTER — Telehealth: Payer: Self-pay

## 2020-08-22 NOTE — Telephone Encounter (Signed)
PA was denied in covermymeds; awaiting PPW for explanation.

## 2020-08-22 NOTE — Telephone Encounter (Signed)
I have called PA department at [phone] to discuss PA denial. Finally was able to get PA approved from 08/22/20 to 02/18/21. I called patient and notified patient that PA was approved and could pick up her medication.

## 2020-08-22 NOTE — Telephone Encounter (Signed)
Called pharmacy and PA is needed for hydrocodone. PA has been started this am in [email or web address]; KEY: [id number]. Will await determination.

## 2020-08-22 NOTE — Telephone Encounter (Signed)
Pt left v/m that Dr Damita Dunnings refilled the hydrocodone for pts back pain and [facility] needs more info before can fill rx. Pt request our office to call [facility] about refill issue and then call pt back.

## 2020-08-23 ENCOUNTER — Other Ambulatory Visit: Payer: Self-pay | Admitting: Hematology and Oncology

## 2020-08-23 DIAGNOSIS — N644 Mastodynia: Secondary | ICD-10-CM

## 2020-08-31 ENCOUNTER — Telehealth: Payer: Self-pay

## 2020-08-31 ENCOUNTER — Inpatient Hospital Stay: Payer: Medicaid Other | Attending: Adult Health | Admitting: Adult Health

## 2020-08-31 NOTE — Telephone Encounter (Signed)
Called and left a message regarding missed 1:30 pm appt with Julia Bihari, NP. Ask her to call the office to reschedule.

## 2020-09-04 DIAGNOSIS — Z170 Estrogen receptor positive status [ER+]: Secondary | ICD-10-CM | POA: Diagnosis not present

## 2020-09-04 DIAGNOSIS — C50412 Malignant neoplasm of upper-outer quadrant of left female breast: Secondary | ICD-10-CM | POA: Diagnosis not present

## 2020-09-15 ENCOUNTER — Other Ambulatory Visit: Payer: Self-pay | Admitting: Family Medicine

## 2020-09-15 ENCOUNTER — Ambulatory Visit
Admission: RE | Admit: 2020-09-15 | Discharge: 2020-09-15 | Disposition: A | Discharge status: Home / Self Care | Payer: Medicaid Other | Source: Ambulatory Visit | Attending: Hematology and Oncology | Admitting: Hematology and Oncology

## 2020-09-15 DIAGNOSIS — R922 Inconclusive mammogram: Secondary | ICD-10-CM | POA: Diagnosis not present

## 2020-09-15 DIAGNOSIS — N644 Mastodynia: Secondary | ICD-10-CM | POA: Diagnosis not present

## 2020-09-15 DIAGNOSIS — Z170 Estrogen receptor positive status [ER+]: Secondary | ICD-10-CM

## 2020-09-15 DIAGNOSIS — Z853 Personal history of malignant neoplasm of breast: Secondary | ICD-10-CM | POA: Diagnosis not present

## 2020-09-17 NOTE — Telephone Encounter (Signed)
Refill request for Norco 5-325 mg tablets  LOV - 08/13/20 Next OV - not scheduled Last refill - 08/14/20 #30/0

## 2020-09-18 ENCOUNTER — Other Ambulatory Visit: Payer: Self-pay | Admitting: Family Medicine

## 2020-09-18 MED ORDER — HYDROCODONE-ACETAMINOPHEN 5-325 MG PO TABS: 1.0000 | ORAL_TABLET | Freq: Four times a day (QID) | ORAL | 0 refills | Status: DC | PRN

## 2020-09-18 NOTE — Telephone Encounter (Signed)
Rx already refilled today; system will not let me deny request.

## 2020-09-18 NOTE — Telephone Encounter (Signed)
Patient notified and OV scheduled for 10/19/20 at 9:00 am.

## 2020-09-18 NOTE — Telephone Encounter (Signed)
Needs DM2 f/u this summer.  We can do labs at the visit.  Sent. Thanks.

## 2020-09-18 NOTE — Telephone Encounter (Signed)
See other note. Thanks.

## 2020-10-02 DIAGNOSIS — Z170 Estrogen receptor positive status [ER+]: Secondary | ICD-10-CM | POA: Diagnosis not present

## 2020-10-02 DIAGNOSIS — C50412 Malignant neoplasm of upper-outer quadrant of left female breast: Secondary | ICD-10-CM | POA: Diagnosis not present

## 2020-10-17 ENCOUNTER — Other Ambulatory Visit: Payer: Self-pay | Admitting: Family Medicine

## 2020-10-17 MED ORDER — HYDROCODONE-ACETAMINOPHEN 5-325 MG PO TABS: 1.0000 | ORAL_TABLET | Freq: Four times a day (QID) | ORAL | 0 refills | Status: DC | PRN

## 2020-10-17 NOTE — Telephone Encounter (Signed)
Sent. Thanks.   

## 2020-10-17 NOTE — Telephone Encounter (Signed)
Last refill: 09/18/20 #30, 0 Last OV: 08/13/20 with Dr. Einar Pheasant VV dx. Gastroenteritis

## 2020-10-18 ENCOUNTER — Ambulatory Visit
Admission: RE | Admit: 2020-10-18 | Discharge: 2020-10-18 | Disposition: A | Discharge status: Home / Self Care | Payer: Medicaid Other | Source: Ambulatory Visit | Attending: Hematology and Oncology | Admitting: Hematology and Oncology

## 2020-10-18 ENCOUNTER — Ambulatory Visit: Payer: Medicaid Other | Attending: Adult Health | Admitting: Rehabilitation

## 2020-10-18 ENCOUNTER — Other Ambulatory Visit: Payer: Self-pay

## 2020-10-18 DIAGNOSIS — Z780 Asymptomatic menopausal state: Secondary | ICD-10-CM | POA: Diagnosis not present

## 2020-10-18 DIAGNOSIS — M85852 Other specified disorders of bone density and structure, left thigh: Secondary | ICD-10-CM | POA: Diagnosis not present

## 2020-10-18 DIAGNOSIS — M810 Age-related osteoporosis without current pathological fracture: Secondary | ICD-10-CM | POA: Diagnosis not present

## 2020-10-19 ENCOUNTER — Ambulatory Visit: Payer: Medicaid Other | Admitting: Family Medicine

## 2020-10-19 ENCOUNTER — Telehealth: Payer: Self-pay

## 2020-10-19 ENCOUNTER — Encounter: Payer: Self-pay | Admitting: Family Medicine

## 2020-10-19 ENCOUNTER — Other Ambulatory Visit: Payer: Self-pay

## 2020-10-19 ENCOUNTER — Other Ambulatory Visit: Payer: Self-pay | Admitting: Adult Health

## 2020-10-19 VITALS — BP 122/70 | HR 74 | Temp 97.8°F | Ht 64.0 in | Wt 141.4 lb

## 2020-10-19 DIAGNOSIS — G5600 Carpal tunnel syndrome, unspecified upper limb: Secondary | ICD-10-CM

## 2020-10-19 DIAGNOSIS — M810 Age-related osteoporosis without current pathological fracture: Secondary | ICD-10-CM | POA: Insufficient documentation

## 2020-10-19 DIAGNOSIS — M816 Localized osteoporosis [Lequesne]: Secondary | ICD-10-CM

## 2020-10-19 DIAGNOSIS — E119 Type 2 diabetes mellitus without complications: Secondary | ICD-10-CM | POA: Diagnosis not present

## 2020-10-19 DIAGNOSIS — M5432 Sciatica, left side: Secondary | ICD-10-CM

## 2020-10-19 DIAGNOSIS — C50412 Malignant neoplasm of upper-outer quadrant of left female breast: Secondary | ICD-10-CM

## 2020-10-19 LAB — POCT GLYCOSYLATED HEMOGLOBIN (HGB A1C): Hemoglobin A1C: 5.6 % (ref 4.0–5.6)

## 2020-10-19 MED ORDER — METFORMIN HCL 500 MG PO TABS: ORAL_TABLET | ORAL | Status: DC

## 2020-10-19 MED ORDER — IBUPROFEN 600 MG PO TABS: 600.0000 mg | ORAL_TABLET | Freq: Three times a day (TID) | ORAL | 2 refills | Status: DC | PRN

## 2020-10-19 MED ORDER — ALBUTEROL SULFATE HFA 108 (90 BASE) MCG/ACT IN AERS: 2.0000 | INHALATION_SPRAY | Freq: Four times a day (QID) | RESPIRATORY_TRACT | 3 refills | Status: DC | PRN

## 2020-10-19 MED ORDER — GABAPENTIN 300 MG PO CAPS: ORAL_CAPSULE | ORAL | 3 refills | Status: DC

## 2020-10-19 NOTE — Telephone Encounter (Signed)
RN spoke with patient.  Patient verbalized understanding and agreement to calcium, vit d, and prolia.   Patient does not currently see or have a dentist.  Julia Bihari, NP aware and risk and benefits will be discussed at follow up visit.  RN sent information as requested to patient on osteoporosis and Prolia.  Scheduling message sent, and revenue staff aware for PA on Prolia.   Prolia REMS sheet emailed to patient.

## 2020-10-19 NOTE — Telephone Encounter (Signed)
-----   Message from Gardenia Phlegm, NP sent at 10/19/2020  1:04 PM EDT ----- [name] has significant osteoporosis.  I recommend calcium, vitamin d intake and prolia injection every 6 months.  To start prolia, she will need dentist check up, and we need to mail her the REMS paperwork about it (which I will email to you).  If agreeable, please let me know so I can order, and schedule for lab, f/u, and injection.  Thanks, LC ----- Message ----- From: Gardenia Phlegm, NP Sent: 10/19/2020  12:37 PM EDT To: Gardenia Phlegm, NP   ----- Message ----- From: Interface, Rad Results In Sent: 10/19/2020  11:28 AM EDT To: Nicholas Lose, MD

## 2020-10-19 NOTE — Progress Notes (Signed)
Patient with osteoporosis noted on most recent bone density testing performed on 10/18/2020 with a t score of -3.7 in the [facility] spine.  Considering the fact that she is also taking Anastrozole, my nurse has notified her of the recommendation to start Prolia.  We are sending her the REMS patient education, and she will f/u with me on 10/29/2020 for lab, f/u and discussion, and an injection.    In Dr. Ethlyn Gallery most recent note from a couple of weeks ago, it appears he ordered a breast MRI.  She has not been contacted about this and it is not scheduled.  I will f/u with Dr. [name] about whether or not he still wants this.    Wilber Bihari, NP

## 2020-10-19 NOTE — Patient Instructions (Signed)
Recheck in about 4 months.  A1c at the visit.  You don't have to fast.   Cut metformin back to once a day in the morning.  If sugar still below 140, then stop med totally.   Update [place] as needed.  Take care.  Glad to see you.

## 2020-10-19 NOTE — Progress Notes (Signed)
This visit occurred during the SARS-CoV-2 public health emergency.  Safety protocols were in place, including screening questions prior to the visit, additional usage of staff PPE, and extensive cleaning of exam room while observing appropriate contact time as indicated for disinfecting solutions.  Julia [name] is putting up with carpal tunnel symptoms B.  Julia [name]'ll update me as needed.  Julia [name] would like to defer tx at this point.    Smoking about 1 PPD, d/w pt.  Using SABA prn.  It helps, used more in the summer.  Encourage smoking cessation.  Pain d/w pt.  Still with L lower back pain, radiating down the leg.  Less pain with gabapentin.  Using hydrocodone prn, sedation caution d/w.  No ADE on med.  Not sedated at OV.  Usually with ~ 1 dose of hydrocodone per day.  Julia [name] had prev help from 2nd injection in her back.  No ADE on ibuprofen.  Last Cr wnl.    Onc f/u d/w pt.  No nausea now, not in the last month.  Still on arimidex.  DXA per onc clinic.    Diabetes:  Using medications without difficulties: yes Hypoglycemic episodes: no Hyperglycemic episodes: no Feet problems:no Blood Sugars averaging: 80-110 usually  eye exam within last year: yes, about 6 months ago.   A1c 5.6.   Julia [name] is avoiding sweets.    Meds, vitals, and allergies reviewed.   ROS: Per HPI unless specifically indicated in ROS section   GEN: nad, alert and oriented HEENT: ncat NECK: supple w/o LA CV: rrr. PULM: ctab, no inc wob ABD: soft, +bs EXT: no edema SKIN: no acute rash

## 2020-10-21 DIAGNOSIS — G5600 Carpal tunnel syndrome, unspecified upper limb: Secondary | ICD-10-CM | POA: Insufficient documentation

## 2020-10-21 NOTE — Assessment & Plan Note (Signed)
Using hydrocodone prn, sedation caution d/w.  No ADE on med.  Not sedated at OV.  Usually with ~ 1 dose of hydrocodone per day.  She had prev help from 2nd injection in her back.  No ADE on ibuprofen.  Last Cr wnl.  Continue with gabapentin and hydrocodone as is.  She will update me as needed.

## 2020-10-21 NOTE — Assessment & Plan Note (Signed)
A1c improved.  Discussed options Recheck in about 4 months.  A1c at the visit.   Cut metformin back to once a day in the morning.  If sugar still below 140, then stop med totally.   Update [place] as needed.  She agrees with plan.

## 2020-10-21 NOTE — Assessment & Plan Note (Signed)
History of, nausea resolved off treatment.  Continue on Arimidex, per oncology.

## 2020-10-21 NOTE — Assessment & Plan Note (Signed)
She is not enthused about an extra procedure at this point.  She is putting up with it and she will update me as needed.  Routine cautions given to patient.

## 2020-10-23 ENCOUNTER — Telehealth: Payer: Self-pay | Admitting: Adult Health

## 2020-10-23 ENCOUNTER — Other Ambulatory Visit: Payer: Self-pay | Admitting: Adult Health

## 2020-10-23 DIAGNOSIS — C50412 Malignant neoplasm of upper-outer quadrant of left female breast: Secondary | ICD-10-CM

## 2020-10-23 NOTE — Telephone Encounter (Signed)
Scheduled appts per 7/15 sch msg. Pt aware.

## 2020-10-23 NOTE — Progress Notes (Signed)
I communicated with Dr. [name] about whether or not Julia [name] should proceed with MRI.  He says that based on her physical exam, recent mammogram and ultrasound, that the next step in evaluation to rule out inflammatory etiology is breast MRI.  He had placed orders but for some reason they are not showing up.  I am placing orders and will f/u with [name] to let her know what is going on.    I called and left [name] a voice mail to update her on this.    Wilber Bihari, NP

## 2020-10-30 ENCOUNTER — Ambulatory Visit (HOSPITAL_COMMUNITY): Admission: RE | Admit: 2020-10-30 | Payer: Medicaid Other | Source: Ambulatory Visit

## 2020-11-02 ENCOUNTER — Other Ambulatory Visit: Payer: Self-pay

## 2020-11-02 DIAGNOSIS — Z170 Estrogen receptor positive status [ER+]: Secondary | ICD-10-CM

## 2020-11-05 ENCOUNTER — Inpatient Hospital Stay: Payer: Medicaid Other

## 2020-11-05 ENCOUNTER — Inpatient Hospital Stay: Payer: Medicaid Other | Attending: Adult Health

## 2020-11-05 ENCOUNTER — Other Ambulatory Visit: Payer: Self-pay

## 2020-11-05 ENCOUNTER — Inpatient Hospital Stay (HOSPITAL_BASED_OUTPATIENT_CLINIC_OR_DEPARTMENT_OTHER): Payer: Medicaid Other | Admitting: Adult Health

## 2020-11-05 ENCOUNTER — Encounter: Payer: Self-pay | Admitting: Adult Health

## 2020-11-05 VITALS — BP 125/60 | HR 78 | Temp 97.7°F | Resp 18 | Ht 64.0 in | Wt 145.0 lb

## 2020-11-05 DIAGNOSIS — E785 Hyperlipidemia, unspecified: Secondary | ICD-10-CM | POA: Insufficient documentation

## 2020-11-05 DIAGNOSIS — Z923 Personal history of irradiation: Secondary | ICD-10-CM | POA: Insufficient documentation

## 2020-11-05 DIAGNOSIS — Z170 Estrogen receptor positive status [ER+]: Secondary | ICD-10-CM | POA: Diagnosis not present

## 2020-11-05 DIAGNOSIS — Z95828 Presence of other vascular implants and grafts: Secondary | ICD-10-CM

## 2020-11-05 DIAGNOSIS — Z803 Family history of malignant neoplasm of breast: Secondary | ICD-10-CM | POA: Diagnosis not present

## 2020-11-05 DIAGNOSIS — E782 Mixed hyperlipidemia: Secondary | ICD-10-CM | POA: Diagnosis not present

## 2020-11-05 DIAGNOSIS — F17210 Nicotine dependence, cigarettes, uncomplicated: Secondary | ICD-10-CM | POA: Insufficient documentation

## 2020-11-05 DIAGNOSIS — F17200 Nicotine dependence, unspecified, uncomplicated: Secondary | ICD-10-CM

## 2020-11-05 DIAGNOSIS — I10 Essential (primary) hypertension: Secondary | ICD-10-CM | POA: Insufficient documentation

## 2020-11-05 DIAGNOSIS — C9221 Atypical chronic myeloid leukemia, BCR/ABL-negative, in remission: Secondary | ICD-10-CM | POA: Diagnosis not present

## 2020-11-05 DIAGNOSIS — M5410 Radiculopathy, site unspecified: Secondary | ICD-10-CM | POA: Insufficient documentation

## 2020-11-05 DIAGNOSIS — C50412 Malignant neoplasm of upper-outer quadrant of left female breast: Secondary | ICD-10-CM

## 2020-11-05 DIAGNOSIS — K219 Gastro-esophageal reflux disease without esophagitis: Secondary | ICD-10-CM | POA: Diagnosis not present

## 2020-11-05 DIAGNOSIS — J449 Chronic obstructive pulmonary disease, unspecified: Secondary | ICD-10-CM | POA: Diagnosis not present

## 2020-11-05 DIAGNOSIS — M47819 Spondylosis without myelopathy or radiculopathy, site unspecified: Secondary | ICD-10-CM | POA: Insufficient documentation

## 2020-11-05 DIAGNOSIS — M810 Age-related osteoporosis without current pathological fracture: Secondary | ICD-10-CM | POA: Diagnosis not present

## 2020-11-05 DIAGNOSIS — Z800 Family history of malignant neoplasm of digestive organs: Secondary | ICD-10-CM | POA: Insufficient documentation

## 2020-11-05 DIAGNOSIS — R21 Rash and other nonspecific skin eruption: Secondary | ICD-10-CM | POA: Insufficient documentation

## 2020-11-05 DIAGNOSIS — M816 Localized osteoporosis [Lequesne]: Secondary | ICD-10-CM

## 2020-11-05 LAB — CMP (CANCER CENTER ONLY)
ALT: 27 U/L (ref 0–44)
AST: 21 U/L (ref 15–41)
Albumin: 3.5 g/dL (ref 3.5–5.0)
Alkaline Phosphatase: 95 U/L (ref 38–126)
Anion gap: 7 (ref 5–15)
BUN: 8 mg/dL (ref 6–20)
CO2: 27 mmol/L (ref 22–32)
Calcium: 9.5 mg/dL (ref 8.9–10.3)
Chloride: 108 mmol/L (ref 98–111)
Creatinine: 0.66 mg/dL (ref 0.44–1.00)
GFR, Estimated: >60 mL/min (ref >60)
Glucose, Bld: 145 mg/dL — ABNORMAL HIGH (ref 70–99)
Potassium: 3.8 mmol/L (ref 3.5–5.1)
Sodium: 142 mmol/L (ref 135–145)
Total Bilirubin: 0.5 mg/dL (ref 0.3–1.2)
Total Protein: 6.3 g/dL — ABNORMAL LOW (ref 6.5–8.1)

## 2020-11-05 LAB — CBC WITH DIFFERENTIAL (CANCER CENTER ONLY)
Abs Immature Granulocytes: 0.02 10*3/uL (ref 0.00–0.07)
Basophils Absolute: 0 10*3/uL (ref 0.0–0.1)
Basophils Relative: 0 %
Eosinophils Absolute: 0.3 10*3/uL (ref 0.0–0.5)
Eosinophils Relative: 3 %
HCT: 39.4 % (ref 36.0–46.0)
Hemoglobin: 14 g/dL (ref 12.0–15.0)
Immature Granulocytes: 0 %
Lymphocytes Relative: 17 %
Lymphs Abs: 1.7 10*3/uL (ref 0.7–4.0)
MCH: 32.7 pg (ref 26.0–34.0)
MCHC: 35.5 g/dL (ref 30.0–36.0)
MCV: 92.1 fL (ref 80.0–100.0)
Monocytes Absolute: 0.5 10*3/uL (ref 0.1–1.0)
Monocytes Relative: 5 %
Neutro Abs: 7.9 10*3/uL — ABNORMAL HIGH (ref 1.7–7.7)
Neutrophils Relative %: 75 %
Platelet Count: 202 10*3/uL (ref 150–400)
RBC: 4.28 MIL/uL (ref 3.87–5.11)
RDW: 12.7 % (ref 11.5–15.5)
WBC Count: 10.4 10*3/uL (ref 4.0–10.5)
nRBC: 0 % (ref 0.0–0.2)

## 2020-11-05 MED ORDER — DENOSUMAB 60 MG/ML ~~LOC~~ SOSY
PREFILLED_SYRINGE | SUBCUTANEOUS | Status: AC
  Filled 2020-11-05: qty 1

## 2020-11-05 MED ORDER — DENOSUMAB 60 MG/ML ~~LOC~~ SOSY
60.0000 mg | PREFILLED_SYRINGE | Freq: Once | SUBCUTANEOUS | Status: AC
  Administered 2020-11-05: 60 mg via SUBCUTANEOUS

## 2020-11-05 NOTE — Progress Notes (Signed)
[facility] Follow up:    Tonia Ghent, MD [place] [address]   DIAGNOSIS: Cancer Staging Malignant neoplasm of upper-outer quadrant of left breast in female, estrogen receptor positive ([place]) Staging form: Breast, AJCC 8th Edition - Clinical: cT2, cN0 - Unsigned - Pathologic stage from 10/18/2019: Stage IB (pT2, pN0(sn), cM0, G3, ER+, PR+, HER2-) - Signed by Nicholas Lose, MD on 10/18/2019 Stage prefix: Initial diagnosis Method of lymph node assessment: Sentinel lymph node biopsy Histologic grading system: 3 grade system   SUMMARY OF ONCOLOGIC HISTORY: Oncology History  Malignant neoplasm of upper-outer quadrant of left breast in female, estrogen receptor positive ([place])  08/25/2019 Initial Diagnosis   Palpable left breast mass x1 month. Diagnostic mammogram and [place] on 08/18/19 showed a 2.8cm mass at the 2 o'clock position, and one lymph node with mild cortical thickening in the left axilla. Biopsy on 08/25/19 showed invasive mammary carcinoma in the breast, grade 3, HER-2 equivocal by IHC (2+) negative by FISH, ER+ >90%, PR+ >90%, left axilla negative   10/07/2019 Surgery   Left lumpectomy and sentinel lymph node biopsy [name]) [phone]): Grade 3 IDC with DCIS, 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   10/18/2019 Cancer Staging   Staging form: Breast, AJCC 8th Edition - Pathologic stage from 10/18/2019: Stage IB (pT2, pN0(sn), cM0, G3, ER+, PR+, HER2-)   10/21/2019 Oncotype testing   The Oncotype DX score was 32 predicting a risk of outside the breast recurrence over the next 9 years of 20% if the patient's only systemic therapy is tamoxifen for 5 years.    11/15/2019 - 03/27/2020 Chemotherapy   Adjuvant chemotherapy with dose dense Adriamycin and Cytoxan x4 (11/15/2019 - 12/27/2019) followed by Taxol weekly x12 (01/09/2020 - 03/27/20).   04/25/2020 - 05/25/2020 Radiation Therapy   The patient initially received a dose of  42.56 Gy in 16 fractions to the breast using whole-breast tangent fields. This was delivered using a 3-D conformal technique. The pt received a boost delivering an additional 8 Gy in 4 fractions using a electron boost with 13mV electrons. The total dose was 50.56 Gy.   05/2020 -  Anti-estrogen oral therapy   Anastrozole     CURRENT THERAPY: Anastrozole daily  INTERVAL HISTORY: KKAEYA [id number].o. female returns for f/u of her breast cancer.  She continues on anastrozole with good tolerance.    Her most recent mammogram was completed on 09/15/2020, she was recommended surgical consultation due to breast skin thickening, and she saw Dr. TMarlou Starks who instead recommended MRI, since that is most sensitive should there be concern for inflammatory changes.  This was not completed, therefore I placed the order for this on 10/23/2020.  She is scheduled to receive this on 11/07/2020.    She underwent bone density testing on 10/18/2020 which showed osteoporosis with a t score of -3.7 in the AP spine.  She is here to discuss and receive prolia.  She wants more information about plans after prolia is complete because she says she will run out of medicaid in 08/2021.   Patient Active Problem List   Diagnosis Date Noted   Carpal tunnel syndrome 10/21/2020   Osteoporosis 10/19/2020   Diabetes mellitus without complication ([facility] 142/70/6237  Port-A-Cath in place 11/29/2019   Malignant neoplasm of upper-outer quadrant of left breast in female, estrogen receptor positive ([facility] 09/01/2019   Encounter for chronic pain management 07/21/2019   Essential (primary) hypertension 02/25/2019  Radiculopathy, lumbar region 02/25/2019   Migraine headache 02/04/2018   Osteoarthritis of facet joint of lumbar spine 08/18/2017   Sciatica, left side 07/18/2017   Syncope 04/14/2017   Cough 08/07/2016   Rash 07/09/2016   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   Advance care planning 12/22/2014   Plantar  fasciitis 10/12/2014   Shingles 11/14/2012   Routine general medical examination at a health care facility 05/07/2011   SKIN LESION 06/22/2008   TOBACCO ABUSE 01/27/2007   HYPERLIPIDEMIA, MIXED 12/14/2006    is allergic to propoxyphene n-acetaminophen, nabumetone, and rofecoxib.  MEDICAL HISTORY: Past Medical History:  Diagnosis Date   Anxiety    over cancer diagnosis   Asthma    bronchial asthma    Cancer ([place]) 09/2019   left breast invasive mammary cancer   Chronic back pain    COPD (chronic obstructive pulmonary disease) (H[facility])    smokes 1 1/2 ppd   GERD (gastroesophageal reflux disease)    Personal history of chemotherapy    Personal history of radiation therapy    Pneumonia    hx of 2018    Smoking     SURGICAL HISTORY: Past Surgical History:  Procedure Laterality Date   BREAST BIOPSY Left 09/2019   x2   BREAST LUMPECTOMY Left 10/2019   BREAST LUMPECTOMY WITH SENTINEL LYMPH NODE BIOPSY Left 10/07/2019   Procedure: LEFT BREAST LUMPECTOMY WITH SENTINEL LYMPH NODE BX;  Surgeon: Jovita Kussmaul, MD;  Location: [facility];  Service: General;  Laterality: Left;  [place] Right 11/10/2019   Procedure: INSERTION PORT-A-CATH;  Surgeon: Jovita Kussmaul, MD;  Location: [facility];  Service: General;  Laterality: Right;   TONSILLECTOMY     VULVECTOMY N/A 01/30/2015   Procedure: WIDE LOCAL EXCISION VULVA;  Surgeon: Everitt Amber, MD;  Location: [facility];  Service: Gynecology;  Laterality: N/A;    SOCIAL HISTORY: Social History   Socioeconomic History   Marital status: Married    Spouse name: Not on file   Number of children: 1   Years of education: 12   Highest education level: Not on file  Occupational History   Occupation: Scientist, water quality at [employer] Use   Smoking status: Every Day    Packs/day: 1.00    Years: 31.00    Pack years: 31.00    Types: Cigarettes   Smokeless tobacco: Never  Vaping Use    Vaping Use: Never used  Substance and Sexual Activity   Alcohol use: No    Alcohol/week: 0.0 standard drinks   Drug use: No   Sexual activity: Not on file  Other Topics Concern   Not on file  Social History Narrative   One son   Married 2000 (had been together since 61)   Social Determinants of Radio broadcast assistant Strain: Not on file  Food Insecurity: Not on file  Transportation Needs: Not on file  Physical Activity: Not on file  Stress: Not on file  Social Connections: Not on file  Intimate Partner Violence: Not on file    FAMILY HISTORY: Family History  Problem Relation Age of Onset   Hypertension Mother    Diabetes Mother        insulin dependent   Heart disease Mother    Diabetes Father    Heart disease Father        CAD   Hypertension Brother    Kidney disease Brother  kidney failure/resolved   Colon cancer Maternal Aunt        dx'd at ~62   Breast cancer Maternal Aunt 65    Review of Systems  Constitutional:  Positive for fatigue. Negative for appetite change, chills, fever and unexpected weight change.  HENT:   Negative for hearing loss, lump/mass and trouble swallowing.   Eyes:  Negative for eye problems and icterus.  Respiratory:  Negative for chest tightness, cough and shortness of breath.   Cardiovascular:  Negative for chest pain, leg swelling and palpitations.  Gastrointestinal:  Negative for abdominal distention, abdominal pain, constipation, diarrhea, nausea and vomiting.  Endocrine: Negative for hot flashes.  Genitourinary:  Negative for difficulty urinating.   Musculoskeletal:  Negative for arthralgias.  Skin:  Negative for itching and rash.  Neurological:  Negative for dizziness, extremity weakness, headaches and numbness.  Hematological:  Negative for adenopathy. Does not bruise/bleed easily.  Psychiatric/Behavioral:  Negative for depression. The patient is not nervous/anxious.      PHYSICAL EXAMINATION  ECOG PERFORMANCE  STATUS: 1 - Symptomatic but completely ambulatory  Vitals:   11/05/20 1344  BP: 125/60  Pulse: 78  Resp: 18  Temp: 97.7 F (36.5 C)  SpO2: 100%    Physical Exam Constitutional:      General: She is not in acute distress.    Appearance: Normal appearance. She is not toxic-appearing.  HENT:     Head: Normocephalic and atraumatic.  Eyes:     General: No scleral icterus. Cardiovascular:     Rate and Rhythm: Normal rate and regular rhythm.     Pulses: Normal pulses.     Heart sounds: Normal heart sounds.  Pulmonary:     Effort: Pulmonary effort is normal.     Breath sounds: Normal breath sounds.     Comments: Left breast s/p lumpectomy and radiation, no sign of local recurrence, radiation changes and thickness noted, right breast benign Abdominal:     General: Abdomen is flat. Bowel sounds are normal. There is no distension.     Palpations: Abdomen is soft.     Tenderness: There is no abdominal tenderness.  Musculoskeletal:        General: No swelling.     Cervical back: Neck supple.  Lymphadenopathy:     Cervical: No cervical adenopathy.  Skin:    General: Skin is warm and dry.     Findings: No rash.  Neurological:     General: No focal deficit present.     Mental Status: She is alert.  Psychiatric:        Mood and Affect: Mood normal.        Behavior: Behavior normal.    LABORATORY DATA:  CBC    Component Value Date/Time   WBC 10.4 11/05/2020 1325   WBC 7.1 02/07/2020 1235   RBC 4.28 11/05/2020 1325   HGB 14.0 11/05/2020 1325   HCT 39.4 11/05/2020 1325   PLT 202 11/05/2020 1325   MCV 92.1 11/05/2020 1325   MCH 32.7 11/05/2020 1325   MCHC 35.5 11/05/2020 1325   RDW 12.7 11/05/2020 1325   LYMPHSABS 1.7 11/05/2020 1325   MONOABS 0.5 11/05/2020 1325   EOSABS 0.3 11/05/2020 1325   BASOSABS 0.0 11/05/2020 1325    CMP     Component Value Date/Time   NA 142 11/05/2020 1325   K 3.8 11/05/2020 1325   CL 108 11/05/2020 1325   CO2 27 11/05/2020 1325    GLUCOSE 145 (H) 11/05/2020 1325   BUN  8 11/05/2020 1325   CREATININE 0.66 11/05/2020 1325   CALCIUM 9.5 11/05/2020 1325   PROT 6.3 (L) 11/05/2020 1325   ALBUMIN 3.5 11/05/2020 1325   AST 21 11/05/2020 1325   ALT 27 11/05/2020 1325   ALKPHOS 95 11/05/2020 1325   BILITOT 0.5 11/05/2020 1325   GFRNONAA >60 11/05/2020 1325   GFRAA >60 01/09/2020 0748       ASSESSMENT and THERAPY PLAN:   Malignant neoplasm of upper-outer quadrant of left breast in female, estrogen receptor positive ([place]) 10/07/2019:Left lumpectomy and sentinel lymph node biopsy [name]): IDC with DCIS, 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  ER 90%, PR 90%, HER-2 negative by FISH Posterior margin positive but no further surgery done because it is muscle on the back. T2N0 stage Ib Oncotype DX score 32: 20% risk of distant recurrence in 9 years   Treatment plan:  1. Adjuvant chemotherapy with dose dense Adriamycin and Cytoxan x4 followed by Taxol weekly x12 completed 03/27/21 2. followed by radiation therapy started 04/26/20 3.  Followed by adjuvant antiestrogen therapy. ------------------------------------------------------------------------------------------------------------------------------------------------- Anastrozole toxicities: She is tolerating it extremely well. Osteoporosis: Prolia every 6 months beginning 11/05/2020--REMS document reviewed with patient in detail, handout provided, she knows to take calcium the day of injection.    Maudie Mercury is a current smoker of 1.5ppd for 25 years.  She meets criteria for lung cancer screening and I placed low dose CT scan orders today.  I offered her smoking resources, and help to quit when she is ready.    She underwent mammogram and ultrasound in June, 2022 and this was followed by surgical evaluation by Dr. [name].  He recommended breast MRI.  This was not scheduled.  I placed orders for this and she will undergo 11/07/2020.    We will await these results, but her  breast exam is consistent with radiation changes to the breast.  She will return in 6 months for labs and f/u with Dr. Lindi Adie, and her next Prolia injection.  She knows to call for any questions that may arise between now and her next appointment.  We are happy to see her sooner if needed.  Total encounter time: 30 minutes in face to face visit time, chart review, lab review, order entry, counseling, care coordination, and documentation of the encounter.    Wilber Bihari, NP 11/05/20 3:44 PM Medical Oncology and Hematology [facility] [place], [place] [address] Tel. [phone]    Fax. [phone]  *Total Encounter Time as defined by the Centers for Medicare and Medicaid Services includes, in addition to the face-to-face time of a patient visit (documented in the note above) non-face-to-face time: obtaining and reviewing outside history, ordering and reviewing medications, tests or procedures, care coordination (communications with other health care professionals or caregivers) and documentation in the medical record.

## 2020-11-05 NOTE — Assessment & Plan Note (Addendum)
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 T2N0 stage Ib Oncotype DX score 32: 20% risk of distant recurrence in 9 years  Treatment plan:  1. Adjuvant chemotherapy with dose dense Adriamycin and Cytoxan x4 followed by Taxol weekly x12 completed 03/27/21 2. followed by radiation therapy started 04/26/20 3. Followed by adjuvant antiestrogen therapy. ------------------------------------------------------------------------------------------------------------------------------------------------- Anastrozole toxicities: She is tolerating it extremely well. Osteoporosis: Prolia every 6 months beginning 11/05/2020--REMS document reviewed with patient in detail, handout provided, she knows to take calcium the day of injection.    Julia [name] is a current smoker of 1.5ppd for 25 years.  She meets criteria for lung cancer screening and I placed low dose CT scan orders today.  I offered her smoking resources, and help to quit when she is ready.    She underwent mammogram and ultrasound in June, 2022 and this was followed by surgical evaluation by Dr. Marlou [name].  He recommended breast MRI.  This was not scheduled.  I placed orders for this and she will undergo 11/07/2020.    We will await these results, but her breast exam is consistent with radiation changes to the breast.  She will return in 6 months for labs and f/u with Julia [name], and her next Prolia injection.  She knows to call for any questions that may arise between now and her next appointment.  We are happy to see her sooner if needed.

## 2020-11-06 ENCOUNTER — Telehealth: Payer: Self-pay | Admitting: Hematology and Oncology

## 2020-11-06 DIAGNOSIS — M47816 Spondylosis without myelopathy or radiculopathy, lumbar region: Secondary | ICD-10-CM | POA: Diagnosis not present

## 2020-11-06 DIAGNOSIS — M5137 Other intervertebral disc degeneration, lumbosacral region: Secondary | ICD-10-CM | POA: Diagnosis not present

## 2020-11-06 DIAGNOSIS — M5416 Radiculopathy, lumbar region: Secondary | ICD-10-CM | POA: Diagnosis not present

## 2020-11-06 NOTE — Telephone Encounter (Signed)
Scheduled appointment per 08/01 los. Patient is aware. 

## 2020-11-07 ENCOUNTER — Ambulatory Visit (HOSPITAL_COMMUNITY)
Admission: RE | Admit: 2020-11-07 | Discharge: 2020-11-07 | Disposition: A | Discharge status: Home / Self Care | Payer: Medicaid Other | Source: Ambulatory Visit | Attending: Adult Health | Admitting: Adult Health

## 2020-11-07 ENCOUNTER — Other Ambulatory Visit: Payer: Self-pay

## 2020-11-07 DIAGNOSIS — N6489 Other specified disorders of breast: Secondary | ICD-10-CM | POA: Diagnosis not present

## 2020-11-07 DIAGNOSIS — C50412 Malignant neoplasm of upper-outer quadrant of left female breast: Secondary | ICD-10-CM | POA: Diagnosis not present

## 2020-11-07 DIAGNOSIS — Z170 Estrogen receptor positive status [ER+]: Secondary | ICD-10-CM | POA: Insufficient documentation

## 2020-11-07 MED ORDER — GADOBUTROL 1 MMOL/ML IV SOLN
6.5000 mL | Freq: Once | INTRAVENOUS | Status: AC | PRN
  Administered 2020-11-07: 6.5 mL via INTRAVENOUS

## 2020-11-08 ENCOUNTER — Other Ambulatory Visit: Payer: Self-pay | Admitting: Family Medicine

## 2020-11-08 MED ORDER — METFORMIN HCL 500 MG PO TABS: ORAL_TABLET | ORAL | 1 refills | Status: DC

## 2020-11-08 NOTE — Telephone Encounter (Signed)
Refill request for HYDROcodone-acetaminophen (NORCO) 5-325 MG tablet  LOV - 10/19/20 Next OV - not scheduled Last refill - 10/17/20 #30/0

## 2020-11-09 ENCOUNTER — Other Ambulatory Visit: Payer: Self-pay | Admitting: Adult Health

## 2020-11-09 DIAGNOSIS — C50412 Malignant neoplasm of upper-outer quadrant of left female breast: Secondary | ICD-10-CM

## 2020-11-09 MED ORDER — HYDROCODONE-ACETAMINOPHEN 5-325 MG PO TABS: 1.0000 | ORAL_TABLET | Freq: Four times a day (QID) | ORAL | 0 refills | Status: DC | PRN

## 2020-11-09 NOTE — Telephone Encounter (Signed)
Sent via other request.

## 2020-11-09 NOTE — Telephone Encounter (Signed)
Duplicate request that I can not deny in system.

## 2020-11-09 NOTE — Telephone Encounter (Signed)
Sent. Thanks.   

## 2020-11-09 NOTE — Progress Notes (Signed)
Reviewed patient MRI results.  Ordering MRI guided biopsy of the left breast to evaluate the area at the lumpectomy site.  Julia [name] verbalized understanding and wants me to call her best friend Julia [name] about this too. I reviewed with Julia [name] who expressed appreciation and gratitude for my call.  Wilber Bihari, NP

## 2020-11-09 NOTE — Telephone Encounter (Signed)
Duplicate Hydrocodone refill request that I can not deny in system.

## 2020-11-13 ENCOUNTER — Ambulatory Visit (HOSPITAL_COMMUNITY): Payer: Medicaid Other

## 2020-11-13 ENCOUNTER — Other Ambulatory Visit: Payer: Self-pay | Admitting: Adult Health

## 2020-11-13 DIAGNOSIS — C50412 Malignant neoplasm of upper-outer quadrant of left female breast: Secondary | ICD-10-CM

## 2020-11-13 DIAGNOSIS — Z170 Estrogen receptor positive status [ER+]: Secondary | ICD-10-CM

## 2020-11-22 ENCOUNTER — Encounter: Payer: Self-pay | Admitting: Adult Health

## 2020-11-22 ENCOUNTER — Other Ambulatory Visit (HOSPITAL_COMMUNITY): Payer: Self-pay | Admitting: Diagnostic Radiology

## 2020-11-22 ENCOUNTER — Other Ambulatory Visit: Payer: Self-pay

## 2020-11-22 ENCOUNTER — Ambulatory Visit
Admission: RE | Admit: 2020-11-22 | Discharge: 2020-11-22 | Disposition: A | Discharge status: Home / Self Care | Payer: Medicaid Other | Source: Ambulatory Visit | Attending: Adult Health | Admitting: Adult Health

## 2020-11-22 DIAGNOSIS — Z170 Estrogen receptor positive status [ER+]: Secondary | ICD-10-CM

## 2020-11-22 DIAGNOSIS — R928 Other abnormal and inconclusive findings on diagnostic imaging of breast: Secondary | ICD-10-CM | POA: Diagnosis not present

## 2020-11-22 DIAGNOSIS — C50412 Malignant neoplasm of upper-outer quadrant of left female breast: Secondary | ICD-10-CM

## 2020-11-22 HISTORY — PX: BREAST BIOPSY: SHX20

## 2020-11-22 MED ORDER — GADOBUTROL 1 MMOL/ML IV SOLN
5.0000 mL | Freq: Once | INTRAVENOUS | Status: AC | PRN
  Administered 2020-11-22: 5 mL via INTRAVENOUS

## 2020-11-27 ENCOUNTER — Other Ambulatory Visit: Payer: Self-pay | Admitting: Family Medicine

## 2020-11-27 DIAGNOSIS — M5416 Radiculopathy, lumbar region: Secondary | ICD-10-CM | POA: Diagnosis not present

## 2020-11-28 ENCOUNTER — Other Ambulatory Visit: Payer: Self-pay | Admitting: Family Medicine

## 2020-11-28 MED ORDER — HYDROCODONE-ACETAMINOPHEN 5-325 MG PO TABS: 1.0000 | ORAL_TABLET | Freq: Four times a day (QID) | ORAL | 0 refills | Status: DC | PRN

## 2020-11-28 NOTE — Telephone Encounter (Signed)
Refill request for Hydrocodone-acetaminophen 5-325 mg tablets  LOV - 10/19/20 Next OV - not scheduled Last refill - 11/09/20 #30/0

## 2020-11-28 NOTE — Telephone Encounter (Signed)
Sent. Thanks.   

## 2020-11-29 NOTE — Telephone Encounter (Signed)
Duplicate request that EMR will not let me decline. Looks like this was sent in yesterday.

## 2020-12-06 ENCOUNTER — Other Ambulatory Visit: Payer: Self-pay | Admitting: Adult Health

## 2020-12-06 DIAGNOSIS — C50412 Malignant neoplasm of upper-outer quadrant of left female breast: Secondary | ICD-10-CM

## 2020-12-06 NOTE — Progress Notes (Signed)
Patient underwent MRI guided biopsy on 11/22/2020 of a left sided breast nodule.  This area was determined to be fat necrosis by pathology review.  The patient was recommended to undergo repeat breast MRI in 6 months.  I placed these orders accordingly.    Wilber Bihari, NP

## 2020-12-11 ENCOUNTER — Telehealth: Payer: Self-pay | Admitting: Family Medicine

## 2020-12-12 MED ORDER — HYDROCODONE-ACETAMINOPHEN 5-325 MG PO TABS: 1.0000 | ORAL_TABLET | Freq: Four times a day (QID) | ORAL | 0 refills | Status: DC | PRN

## 2020-12-12 NOTE — Telephone Encounter (Signed)
Duly noted.  Thanks. 

## 2020-12-12 NOTE — Telephone Encounter (Signed)
Pt called back she is getting ready to go on vacation and wanted to make sure she had enough

## 2020-12-12 NOTE — Telephone Encounter (Signed)
Sent.  Please get update on patient.  This refill isn't technically early, but she usually gets about 4 weeks per rx.  Please see about her situation and update me as needed.  Thanks.

## 2020-12-12 NOTE — Telephone Encounter (Signed)
Left message for patient to call back to discuss.

## 2020-12-12 NOTE — Telephone Encounter (Signed)
Name of Medication: [place] Name of Pharmacy: [facility] or Written Date and Quantity: 11/28/20 #30 0 refill Last Office Visit and Type: 10/19/20 follow up visit Next Office Visit and Type: none Last Controlled Substance Agreement Date: 10/29/13 Last UDS: 02/24/20

## 2021-01-07 ENCOUNTER — Other Ambulatory Visit: Payer: Self-pay | Admitting: Family Medicine

## 2021-01-08 ENCOUNTER — Other Ambulatory Visit: Payer: Self-pay | Admitting: Family Medicine

## 2021-01-08 NOTE — Telephone Encounter (Signed)
Last OV - 10/19/2020 Next OV - N/A Last Filled - 12/12/2020

## 2021-01-09 MED ORDER — HYDROCODONE-ACETAMINOPHEN 5-325 MG PO TABS: 1.0000 | ORAL_TABLET | Freq: Four times a day (QID) | ORAL | 0 refills | Status: DC | PRN

## 2021-01-09 NOTE — Telephone Encounter (Signed)
Sent. Thanks.   

## 2021-01-09 NOTE — Telephone Encounter (Signed)
Refill request for HYDROcodone-acetaminophen (NORCO) 5-325 MG tablet  LOV - 10/19/20 Next OV - not scheduled Last refill - 12/12/20 #30/0

## 2021-01-21 ENCOUNTER — Other Ambulatory Visit: Payer: Self-pay | Admitting: Adult Health

## 2021-01-21 DIAGNOSIS — Z170 Estrogen receptor positive status [ER+]: Secondary | ICD-10-CM

## 2021-01-23 ENCOUNTER — Encounter: Payer: Self-pay | Admitting: Adult Health

## 2021-01-31 ENCOUNTER — Other Ambulatory Visit: Payer: Self-pay | Admitting: Family Medicine

## 2021-01-31 NOTE — Telephone Encounter (Signed)
Refill request for HYDROcodone-acetaminophen ([place]) 5-325 MG tablet   LOV - 10/19/20 Next OV - not scheduled Last refill - 01/09/2021 #30/0 UDS: 02/24/20 Contract: 10/31/13

## 2021-02-01 ENCOUNTER — Telehealth: Payer: Self-pay

## 2021-02-01 MED ORDER — HYDROCODONE-ACETAMINOPHEN 5-325 MG PO TABS: 1.0000 | ORAL_TABLET | Freq: Four times a day (QID) | ORAL | 0 refills | Status: DC | PRN

## 2021-02-01 NOTE — Telephone Encounter (Signed)
Contacted pt to RS an apt and he reported his wife was positive for covid and had tested on Mon. Currently pt has some lingering dry cough but denies any other symptoms. Pt originally had a lot of nausea and vomiting but has not had any vomiting in the last 3 days and is now able to eat. Pt denies all other symptoms and is not requesting any medication but wanted her PCP to know. Advised of ER precautions and if any changes to contact the office. Pt verbalized understanding.

## 2021-02-01 NOTE — Telephone Encounter (Signed)
Noted. Thanks.

## 2021-02-01 NOTE — Telephone Encounter (Signed)
Sent. Thanks.   

## 2021-02-09 ENCOUNTER — Encounter: Payer: Self-pay | Admitting: Family Medicine

## 2021-02-11 NOTE — Telephone Encounter (Signed)
Left  a message on voicemail for patient to call the office back. Will also send patient a mychart message to call the office and ask to speak with a nurse.

## 2021-02-11 NOTE — Telephone Encounter (Signed)
I called pt and she said that pt had + covid test on 01/28/21. Pt said she had a fever (the highest fever was 102) and chills on 01/28/21 thru 01/30/21 or 01/31/21; pt said that she went back to work on 02/11/21. Pt said " I feel pretty good today". Pt has been taking Mucinex and will cb if needed. Sending FYI to Dr Damita Dunnings and Janett Billow CMA.

## 2021-02-12 NOTE — Telephone Encounter (Signed)
Noted. Thanks.

## 2021-02-15 ENCOUNTER — Other Ambulatory Visit: Payer: Self-pay | Admitting: Family Medicine

## 2021-02-18 ENCOUNTER — Other Ambulatory Visit: Payer: Self-pay | Admitting: Family Medicine

## 2021-02-18 ENCOUNTER — Telehealth: Payer: Self-pay | Admitting: *Deleted

## 2021-02-18 NOTE — Telephone Encounter (Signed)
Received call from pt with complaint of left breast pain, swelling, redness and warmth x2.5 weeks. Pt denies recent injury, trauma, or fever.  Per MD pt needing to be seen by NP for further evaluation and tx.  Appt scheduled and pt verbalized understanding of appt date and time.

## 2021-02-19 ENCOUNTER — Other Ambulatory Visit: Payer: Self-pay | Admitting: Family Medicine

## 2021-02-19 MED ORDER — HYDROCODONE-ACETAMINOPHEN 5-325 MG PO TABS: 1.0000 | ORAL_TABLET | Freq: Four times a day (QID) | ORAL | 0 refills | Status: DC | PRN

## 2021-02-19 NOTE — Telephone Encounter (Signed)
Duplicate request; EMR will not let me deny.

## 2021-02-19 NOTE — Telephone Encounter (Signed)
Refill request for HYDROcodone-acetaminophen (NORCO) 5-325 MG tablet  LOV - 10/19/20 Next OV - not scheduled Last refill - 02/01/21 #30/0

## 2021-02-19 NOTE — Telephone Encounter (Signed)
Sent. Thanks.   

## 2021-02-19 NOTE — Telephone Encounter (Signed)
Sent via other note.  Thanks.

## 2021-02-21 NOTE — Progress Notes (Signed)
[facility] Follow up:    Julia Ghent, MD [place] [address]   DIAGNOSIS:  Cancer Staging  Malignant neoplasm of upper-outer quadrant of left breast in female, estrogen receptor positive ([place]) Staging form: Breast, AJCC 8th Edition - Clinical: cT2, cN0 - Unsigned - Pathologic stage from 10/18/2019: Stage IB (pT2, pN0(sn), cM0, G3, ER+, PR+, HER2-) - Signed by Nicholas Lose, MD on 10/18/2019 Stage prefix: Initial diagnosis Method of lymph node assessment: Sentinel lymph node biopsy Histologic grading system: 3 grade system   SUMMARY OF ONCOLOGIC HISTORY: Oncology History  Malignant neoplasm of upper-outer quadrant of left breast in female, estrogen receptor positive ([place])  08/25/2019 Initial Diagnosis   Palpable left breast mass x1 month. Diagnostic mammogram and [place] on 08/18/19 showed a 2.8cm mass at the 2 o'clock position, and one lymph node with mild cortical thickening in the left axilla. Biopsy on 08/25/19 showed invasive mammary carcinoma in the breast, grade 3, HER-2 equivocal by IHC (2+) negative by FISH, ER+ >90%, PR+ >90%, left axilla negative   10/07/2019 Surgery   Left lumpectomy and sentinel lymph node biopsy Julia [name]) [phone]): Grade 3 IDC with DCIS, 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   10/18/2019 Cancer Staging   Staging form: Breast, AJCC 8th Edition - Pathologic stage from 10/18/2019: Stage IB (pT2, pN0(sn), cM0, G3, ER+, PR+, HER2-)   10/21/2019 Oncotype testing   The Oncotype DX score was 32 predicting a risk of outside the breast recurrence over the next 9 years of 20% if the patient's only systemic therapy is tamoxifen for 5 years.    11/15/2019 - 03/27/2020 Chemotherapy   Adjuvant chemotherapy with dose dense Adriamycin and Cytoxan x4 (11/15/2019 - 12/27/2019) followed by Taxol weekly x12 (01/09/2020 - 03/27/20).   04/25/2020 - 05/25/2020 Radiation Therapy   The patient initially received a dose of  42.56 Gy in 16 fractions to the breast using whole-breast tangent fields. This was delivered using a 3-D conformal technique. The pt received a boost delivering an additional 8 Gy in 4 fractions using a electron boost with 109mV electrons. The total dose was 50.56 Gy.   05/2020 -  Anti-estrogen oral therapy   Anastrozole     CURRENT THERAPY: Anastrozole  INTERVAL HISTORY: Julia REDDINGER52y.o. female returns for evaluation of a left breast lump.  She underwent bilateral mammogram that was completed on 09/15/2020.  She then underwent MRI that showed a 5 mm focus in the left breast.  MRI guided biopsy was performed on November 22, 2020.  Pathology revealed fat necrosis and findings were concordant.  She has noted increased redness tenderness warmth and pain in her left breast particularly over the past 4 weeks.  She denies any fever or chills.   Patient Active Problem List   Diagnosis Date Noted   Carpal tunnel syndrome 10/21/2020   Osteoporosis 10/19/2020   Diabetes mellitus without complication ([facility] 125/08/3974  Port-A-Cath in place 11/29/2019   Malignant neoplasm of upper-outer quadrant of left breast in female, estrogen receptor positive ([facility] 09/01/2019   Encounter for chronic pain management 07/21/2019   Essential (primary) hypertension 02/25/2019   Radiculopathy, lumbar region 02/25/2019   Migraine headache 02/04/2018   Osteoarthritis of facet joint of lumbar spine 08/18/2017   Sciatica, left side 07/18/2017   Syncope 04/14/2017   Cough 08/07/2016   Rash 07/09/2016   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  MEDICAL HISTORY: Past  Medical History:  Diagnosis Date   Anxiety    over cancer diagnosis   Asthma    bronchial asthma    Cancer ([place]) 09/2019   left breast invasive mammary cancer   Chronic back pain    COPD (chronic obstructive pulmonary disease) (HCC)    smokes 1 1/2 ppd   GERD (gastroesophageal reflux disease)    Personal history of chemotherapy    Personal history of radiation therapy    Pneumonia    hx of 2018    Smoking     SURGICAL HISTORY: Past Surgical History:  Procedure Laterality Date   BREAST BIOPSY Left 09/2019   x2   BREAST BIOPSY Left 11/22/2020   BREAST LUMPECTOMY Left 10/2019   BREAST LUMPECTOMY WITH SENTINEL LYMPH NODE BIOPSY Left 10/07/2019   Procedure: LEFT BREAST LUMPECTOMY WITH SENTINEL LYMPH NODE BX;  Surgeon: Jovita Kussmaul, MD;  Location: [facility];  Service: General;  Laterality: Left;  [place] Right 11/10/2019   Procedure: INSERTION PORT-A-CATH;  Surgeon: Jovita Kussmaul, MD;  Location: [facility];  Service: General;  Laterality: Right;   TONSILLECTOMY     VULVECTOMY N/A 01/30/2015   Procedure: WIDE LOCAL EXCISION VULVA;  Surgeon: Everitt Amber, MD;  Location: [facility];  Service: Gynecology;  Laterality: N/A;    SOCIAL HISTORY: Social History   Socioeconomic History   Marital status: Married    Spouse name: Not on file   Number of children: 1   Years of education: 12   Highest education level: Not on file  Occupational History   Occupation: Scientist, water quality at [employer] Use   Smoking status: Every Day    Packs/day: 1.00    Years: 31.00    Pack years: 31.00    Types: Cigarettes   Smokeless tobacco: Never  Vaping Use   Vaping Use: Never used  Substance and Sexual Activity   Alcohol use: No    Alcohol/week: 0.0 standard drinks   Drug use: No   Sexual activity: Not on file  Other Topics Concern   Not on file  Social History Narrative   One son   Married 2000 (had been together since 14)    Social Determinants of Radio broadcast assistant Strain: Not on file  Food Insecurity: Not on file  Transportation Needs: Not on file  Physical Activity: Not on file  Stress: Not on file  Social Connections: Not on file  Intimate Partner Violence: Not on file    FAMILY HISTORY: Family History  Problem Relation Age of Onset   Hypertension Mother    Diabetes Mother        insulin dependent   Heart disease Mother    Diabetes Father    Heart disease Father        CAD   Hypertension Brother    Kidney disease Brother        kidney failure/resolved   Colon cancer Maternal Aunt        dx'd at ~62   Breast cancer Maternal Aunt 65    Review of Systems  Constitutional:  Negative for appetite change, chills, fatigue, fever and  unexpected weight change.  HENT:   Negative for hearing loss, lump/mass and trouble swallowing.   Eyes:  Negative for eye problems and icterus.  Respiratory:  Negative for chest tightness, cough and shortness of breath.   Cardiovascular:  Negative for chest pain, leg swelling and palpitations.  Gastrointestinal:  Negative for abdominal distention, abdominal pain, constipation, diarrhea, nausea and vomiting.  Endocrine: Negative for hot flashes.  Genitourinary:  Negative for difficulty urinating.   Musculoskeletal:  Negative for arthralgias.  Skin:  Negative for itching and rash.  Neurological:  Negative for dizziness, extremity weakness, headaches and numbness.  Hematological:  Negative for adenopathy. Does not bruise/bleed easily.  Psychiatric/Behavioral:  Negative for depression. The patient is not nervous/anxious.      PHYSICAL EXAMINATION  ECOG PERFORMANCE STATUS: 1 - Symptomatic but completely ambulatory  Vitals:   02/22/21 0826  BP: 114/63  Pulse: 85  Resp: 18  Temp: 98.1 F (36.7 C)  SpO2: 98%    Physical Exam Constitutional:      General: She is not in acute distress.    Appearance: Normal appearance. She is not toxic-appearing.   HENT:     Head: Normocephalic and atraumatic.  Eyes:     General: No scleral icterus. Cardiovascular:     Rate and Rhythm: Normal rate and regular rhythm.     Pulses: Normal pulses.     Heart sounds: Normal heart sounds.  Pulmonary:     Effort: Pulmonary effort is normal.     Breath sounds: Normal breath sounds.  Chest:     Comments: Right breast is pictured below exquisitely tender warm to touch and erythematous/swollen, ?  Fluctuance in left lower outer quadrant Abdominal:     General: Abdomen is flat. Bowel sounds are normal. There is no distension.     Palpations: Abdomen is soft.     Tenderness: There is no abdominal tenderness.  Musculoskeletal:        General: No swelling.     Cervical back: Neck supple.  Lymphadenopathy:     Cervical: No cervical adenopathy.  Skin:    General: Skin is warm and dry.     Findings: No rash.  Neurological:     General: No focal deficit present.     Mental Status: She is alert.  Psychiatric:        Mood and Affect: Mood normal.        Behavior: Behavior normal.    LABORATORY DATA:  CBC    Component Value Date/Time   WBC 10.4 11/05/2020 1325   WBC 7.1 02/07/2020 1235   RBC 4.28 11/05/2020 1325   HGB 14.0 11/05/2020 1325   HCT 39.4 11/05/2020 1325   PLT 202 11/05/2020 1325   MCV 92.1 11/05/2020 1325   MCH 32.7 11/05/2020 1325   MCHC 35.5 11/05/2020 1325   RDW 12.7 11/05/2020 1325   LYMPHSABS 1.7 11/05/2020 1325   MONOABS 0.5 11/05/2020 1325   EOSABS 0.3 11/05/2020 1325   BASOSABS 0.0 11/05/2020 1325    CMP     Component Value Date/Time   NA 142 11/05/2020 1325   K 3.8 11/05/2020 1325   CL 108 11/05/2020 1325   CO2 27 11/05/2020 1325   GLUCOSE 145 (H) 11/05/2020 1325   BUN 8 11/05/2020 1325   CREATININE 0.66 11/05/2020 1325   CALCIUM 9.5 11/05/2020 1325   PROT 6.3 (L) 11/05/2020 1325   ALBUMIN 3.5 11/05/2020 1325   AST 21 11/05/2020 1325   ALT 27 11/05/2020 1325  ALKPHOS 95 11/05/2020 1325   BILITOT 0.5  11/05/2020 1325   GFRNONAA >60 11/05/2020 1325   GFRAA >60 01/09/2020 0748     ASSESSMENT and THERAPY PLAN:   Malignant neoplasm of upper-outer quadrant of left breast in female, estrogen receptor positive ([place]) 10/07/2019:Left lumpectomy and sentinel lymph node biopsy Julia [name]): IDC with DCIS, 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  ER 90%, PR 90%, HER-2 negative by FISH Posterior margin positive but no further surgery done because it is muscle on the back. T2N0 stage Ib Oncotype DX score 32: 20% risk of distant recurrence in 9 years   Treatment plan:  1. Adjuvant chemotherapy with dose dense Adriamycin and Cytoxan x4 followed by Taxol weekly x12 completed 03/27/21 2. followed by radiation therapy started 04/26/20 3.  Followed by adjuvant antiestrogen therapy. ------------------------------------------------------------------------------------------------------------------------------------------------- Anastrozole toxicities: She is tolerating it extremely well. Osteoporosis: Prolia every 6 months beginning 11/05/2020--REMS document reviewed with patient in detail, handout provided, she knows to take calcium the day of injection.     Julia [name] has some left breast changes.  These are different than when I saw her previously for the redness with her radiation.  These seem to be more consistent with infection.  I have placed orders for her to start taking Augmentin.  I put in for an ultrasound of the left breast with possible aspiration to be completed at the breast center.  She also needs to get back in with Dr. Marlou [name] she has undergone a full work-up with mammogram breast MRI and should the antibiotics not be effective she will need skin punch biopsy of the breast.   Orders Placed This Encounter  Procedures   US BREAST ASPIRATION LEFT    Standing Status:   Future    Standing Expiration Date:   02/22/2022    Order Specific Question:   Reason for Exam (SYMPTOM  OR DIAGNOSIS  REQUIRED)    Answer:   left breast erythema and exquisite pain    Order Specific Question:   Preferred imaging location?    Answer:   [facility]    All questions were answered. The patient knows to call the clinic with any problems, questions or concerns. We can certainly see the patient much sooner if necessary.  Total encounter time: 30 minutes in face-to-face visit time, chart review, lab review, care coordination, and documentation of the encounter.  Wilber Bihari, NP 02/22/21 8:53 AM Medical Oncology and Hematology [facility] [place], [place] [address] Tel. [phone]    Fax. [phone]  *Total Encounter Time as defined by the Centers for Medicare and Medicaid Services includes, in addition to the face-to-face time of a patient visit (documented in the note above) non-face-to-face time: obtaining and reviewing outside history, ordering and reviewing medications, tests or procedures, care coordination (communications with other health care professionals or caregivers) and documentation in the medical record.

## 2021-02-21 NOTE — Assessment & Plan Note (Addendum)
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 T2N0 stage Ib Oncotype DX score 32: 20% risk of distant recurrence in 9 years  Treatment plan:  1. Adjuvant chemotherapy with dose dense Adriamycin and Cytoxan x4 followed by Taxol weekly x12 completed 03/27/21 2. followed by radiation therapy started 04/26/20 3. Followed by adjuvant antiestrogen therapy. ------------------------------------------------------------------------------------------------------------------------------------------------- Anastrozole toxicities: She is tolerating it extremely well. Osteoporosis: Prolia every 6 months beginning 11/05/2020--REMS document reviewed with patient in detail, handout provided, she knows to take calcium the day of injection.     Geraldy has some left breast changes.  These are different than when I saw her previously for the redness with her radiation.  These seem to be more consistent with infection.  I have placed orders for her to start taking Augmentin.  I put in for an ultrasound of the left breast with possible aspiration to be completed at the breast center.  She also needs to get back in with Dr. [name] she has undergone a full work-up with mammogram breast MRI and should the antibiotics not be effective she will need skin punch biopsy of the breast.

## 2021-02-22 ENCOUNTER — Other Ambulatory Visit: Payer: Self-pay | Admitting: Adult Health

## 2021-02-22 ENCOUNTER — Encounter: Payer: Self-pay | Admitting: Adult Health

## 2021-02-22 ENCOUNTER — Other Ambulatory Visit: Payer: Self-pay

## 2021-02-22 ENCOUNTER — Inpatient Hospital Stay: Payer: Medicaid Other | Attending: Adult Health | Admitting: Adult Health

## 2021-02-22 VITALS — BP 114/63 | HR 85 | Temp 98.1°F | Resp 18 | Ht 64.0 in | Wt 141.6 lb

## 2021-02-22 DIAGNOSIS — J449 Chronic obstructive pulmonary disease, unspecified: Secondary | ICD-10-CM | POA: Insufficient documentation

## 2021-02-22 DIAGNOSIS — M810 Age-related osteoporosis without current pathological fracture: Secondary | ICD-10-CM | POA: Insufficient documentation

## 2021-02-22 DIAGNOSIS — Z9221 Personal history of antineoplastic chemotherapy: Secondary | ICD-10-CM | POA: Insufficient documentation

## 2021-02-22 DIAGNOSIS — K219 Gastro-esophageal reflux disease without esophagitis: Secondary | ICD-10-CM | POA: Insufficient documentation

## 2021-02-22 DIAGNOSIS — E119 Type 2 diabetes mellitus without complications: Secondary | ICD-10-CM | POA: Insufficient documentation

## 2021-02-22 DIAGNOSIS — E785 Hyperlipidemia, unspecified: Secondary | ICD-10-CM | POA: Diagnosis not present

## 2021-02-22 DIAGNOSIS — F17210 Nicotine dependence, cigarettes, uncomplicated: Secondary | ICD-10-CM | POA: Insufficient documentation

## 2021-02-22 DIAGNOSIS — Z170 Estrogen receptor positive status [ER+]: Secondary | ICD-10-CM | POA: Diagnosis not present

## 2021-02-22 DIAGNOSIS — Z79811 Long term (current) use of aromatase inhibitors: Secondary | ICD-10-CM | POA: Insufficient documentation

## 2021-02-22 DIAGNOSIS — M1990 Unspecified osteoarthritis, unspecified site: Secondary | ICD-10-CM | POA: Insufficient documentation

## 2021-02-22 DIAGNOSIS — D071 Carcinoma in situ of vulva: Secondary | ICD-10-CM | POA: Diagnosis not present

## 2021-02-22 DIAGNOSIS — Z923 Personal history of irradiation: Secondary | ICD-10-CM | POA: Diagnosis not present

## 2021-02-22 DIAGNOSIS — C50412 Malignant neoplasm of upper-outer quadrant of left female breast: Secondary | ICD-10-CM | POA: Insufficient documentation

## 2021-02-22 MED ORDER — AMOXICILLIN-POT CLAVULANATE 875-125 MG PO TABS: 1.0000 | ORAL_TABLET | Freq: Two times a day (BID) | ORAL | 0 refills | Status: DC

## 2021-02-22 NOTE — Progress Notes (Signed)
Left breast 11.18.22

## 2021-03-04 ENCOUNTER — Ambulatory Visit
Admission: RE | Admit: 2021-03-04 | Discharge: 2021-03-04 | Disposition: A | Discharge status: Home / Self Care | Payer: Medicaid Other | Source: Ambulatory Visit | Attending: Adult Health | Admitting: Adult Health

## 2021-03-04 ENCOUNTER — Other Ambulatory Visit: Payer: Self-pay | Admitting: Adult Health

## 2021-03-04 ENCOUNTER — Other Ambulatory Visit: Payer: Self-pay

## 2021-03-04 ENCOUNTER — Ambulatory Visit: Payer: Medicaid Other

## 2021-03-04 DIAGNOSIS — C50412 Malignant neoplasm of upper-outer quadrant of left female breast: Secondary | ICD-10-CM

## 2021-03-04 DIAGNOSIS — R2232 Localized swelling, mass and lump, left upper limb: Secondary | ICD-10-CM

## 2021-03-04 DIAGNOSIS — Z853 Personal history of malignant neoplasm of breast: Secondary | ICD-10-CM | POA: Diagnosis not present

## 2021-03-04 DIAGNOSIS — Z170 Estrogen receptor positive status [ER+]: Secondary | ICD-10-CM

## 2021-03-04 DIAGNOSIS — R922 Inconclusive mammogram: Secondary | ICD-10-CM | POA: Diagnosis not present

## 2021-03-05 ENCOUNTER — Encounter: Payer: Self-pay | Admitting: Adult Health

## 2021-03-07 ENCOUNTER — Other Ambulatory Visit: Payer: Self-pay | Admitting: Family Medicine

## 2021-03-07 ENCOUNTER — Telehealth: Payer: Self-pay | Admitting: Family Medicine

## 2021-03-07 NOTE — Telephone Encounter (Signed)
  Encourage patient to contact the pharmacy for refills or they can request refills through [facility]:  Please schedule appointment if longer than 1 year  NEXT APPOINTMENT DATE:  MEDICATION:ibuprofen (ADVIL) 600 MG tablet   Is the patient out of medication?   PHARMACY:[id number] [place], [place] [address]    Let patient know to contact pharmacy at the end of the day to make sure medication is ready.  Please notify patient to allow 48-72 hours to process  CLINICAL FILLS OUT ALL BELOW:   LAST REFILL:  QTY:  REFILL DATE:    OTHER COMMENTS:    Okay for refill?  Please advise

## 2021-03-07 NOTE — Telephone Encounter (Signed)
LOV - 10/19/20 NOV - not scheduled RF - 10/19/20 #90/2

## 2021-03-08 ENCOUNTER — Other Ambulatory Visit: Payer: Self-pay | Admitting: Family Medicine

## 2021-03-08 ENCOUNTER — Ambulatory Visit
Admission: RE | Admit: 2021-03-08 | Discharge: 2021-03-08 | Disposition: A | Discharge status: Home / Self Care | Payer: Medicaid Other | Source: Ambulatory Visit | Attending: Adult Health | Admitting: Adult Health

## 2021-03-08 DIAGNOSIS — R2232 Localized swelling, mass and lump, left upper limb: Secondary | ICD-10-CM

## 2021-03-08 MED ORDER — IBUPROFEN 600 MG PO TABS: 600.0000 mg | ORAL_TABLET | Freq: Three times a day (TID) | ORAL | 2 refills | Status: DC | PRN

## 2021-03-08 NOTE — Telephone Encounter (Signed)
Name of Medication: Hydrocodone apap 5-325 mg Name of Pharmacy: [facility] [place] or Written Date and Quantity: # 30 on 02/19/2021 Last Office Visit and Type: 10/19/20 FU Next Office Visit and Type: none scheduled Last Controlled Substance Agreement Date: 02/27/2020 Last UDS:02/24/2020

## 2021-03-08 NOTE — Addendum Note (Signed)
Addended by: Tonia Ghent on: 03/08/2021 07:59 AM   Modules accepted: Orders

## 2021-03-08 NOTE — Telephone Encounter (Signed)
Sent. Thanks.   

## 2021-03-08 NOTE — Telephone Encounter (Signed)
Refill request for Hydrocodone 5-325 mg tab  LOV - 10/19/20 Next OV - not scheduled Last refill - 02/19/21 #30/0

## 2021-03-09 ENCOUNTER — Other Ambulatory Visit: Payer: Self-pay | Admitting: Family Medicine

## 2021-03-09 NOTE — Telephone Encounter (Signed)
Last filled by historical provider.   Last OV: 07/22  No future appts.

## 2021-03-10 MED ORDER — HYDROCODONE-ACETAMINOPHEN 5-325 MG PO TABS: 1.0000 | ORAL_TABLET | Freq: Four times a day (QID) | ORAL | 0 refills | Status: DC | PRN

## 2021-03-10 MED ORDER — OMEPRAZOLE MAGNESIUM 20 MG PO TBEC: 20.0000 mg | DELAYED_RELEASE_TABLET | ORAL | 1 refills | Status: DC

## 2021-03-10 NOTE — Telephone Encounter (Signed)
Duplicate request.  Already addressed on another encounter.

## 2021-03-10 NOTE — Telephone Encounter (Signed)
Sent. Thanks.   

## 2021-03-10 NOTE — Telephone Encounter (Signed)
Okay to continue.  Sent.  Thanks. 

## 2021-03-11 ENCOUNTER — Encounter: Payer: Self-pay | Admitting: Adult Health

## 2021-03-14 ENCOUNTER — Other Ambulatory Visit: Payer: Medicaid Other

## 2021-03-20 ENCOUNTER — Ambulatory Visit: Payer: Self-pay | Admitting: General Surgery

## 2021-03-20 DIAGNOSIS — C50412 Malignant neoplasm of upper-outer quadrant of left female breast: Secondary | ICD-10-CM | POA: Diagnosis not present

## 2021-03-20 DIAGNOSIS — Z170 Estrogen receptor positive status [ER+]: Secondary | ICD-10-CM | POA: Diagnosis not present

## 2021-03-24 NOTE — Progress Notes (Signed)
Patient Care Team: Tonia Ghent, MD as PCP - General (Family Medicine) [name], RN as Registered Nurse [name], RN as Registered Nurse [name], RN as Oncology Nurse Navigator [name], RN as Oncology Nurse Navigator Kyung Rudd, MD as Consulting Physician (Radiation Oncology) Nicholas Lose, MD as Consulting Physician (Hematology and Oncology) Jovita Kussmaul, MD as Consulting Physician (General Surgery)  DIAGNOSIS:    ICD-10-CM   1. Malignant neoplasm of upper-outer quadrant of left breast in female, estrogen receptor positive ([place])  C50.412    Z17.0       SUMMARY OF ONCOLOGIC HISTORY: Oncology History  Malignant neoplasm of upper-outer quadrant of left breast in female, estrogen receptor positive ([place])  08/25/2019 Initial Diagnosis   Palpable left breast mass x1 month. Diagnostic mammogram and [place] on 08/18/19 showed a 2.8cm mass at the 2 o'clock position, and one lymph node with mild cortical thickening in the left axilla. Biopsy on 08/25/19 showed invasive mammary carcinoma in the breast, grade 3, HER-2 equivocal by IHC (2+) negative by FISH, ER+ >90%, PR+ >90%, left axilla negative   10/07/2019 Surgery   Left lumpectomy and sentinel lymph node biopsy [name]) [phone]): Grade 3 IDC with DCIS, 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   10/18/2019 Cancer Staging   Staging form: Breast, AJCC 8th Edition - Pathologic stage from 10/18/2019: Stage IB (pT2, pN0(sn), cM0, G3, ER+, PR+, HER2-)   10/21/2019 Oncotype testing   The Oncotype DX score was 32 predicting a risk of outside the breast recurrence over the next 9 years of 20% if the patient's only systemic therapy is tamoxifen for 5 years.    11/15/2019 - 03/27/2020 Chemotherapy   Adjuvant chemotherapy with dose dense Adriamycin and Cytoxan x4 (11/15/2019 - 12/27/2019) followed by Taxol weekly x12 (01/09/2020 - 03/27/20).   04/25/2020 - 05/25/2020 Radiation Therapy   The patient  initially received a dose of 42.56 Gy in 16 fractions to the breast using whole-breast tangent fields. This was delivered using a 3-D conformal technique. The pt received a boost delivering an additional 8 Gy in 4 fractions using a electron boost with 32mV electrons. The total dose was 50.56 Gy.   05/2020 -  Anti-estrogen oral therapy   Anastrozole     CHIEF COMPLIANT: Follow-up of left breast cancer  INTERVAL HISTORY: Julia ARPis a 55y.o. with above-mentioned history of left breast cancer treated with lumpectomy, adjuvant chemotherapy, radiation, and is currently on antiestrogen therapy with anastrozole. Mammogram on 03/04/2021 showed diffuse skin thickening in the left breast, lumpectomy changes are identified, and no discrete mass or malignant type microcalcifications identified. Julia [name] presents to the clinic today for follow-up.  Continued pain and discomfort in the left breast.  Left breast lymphedema accompanied by pain and discomfort.  Julia [name] had a biopsy of the left breast in August 2022 and a biopsy of the left axilla recently both of which were benign.  Both showed fat necrosis.  Julia [name] saw Dr. TMarlou Starksand Julia [name] is going to undergo left mastectomy for intractable pain.  ALLERGIES:  is allergic to propoxyphene n-acetaminophen, nabumetone, and rofecoxib.  MEDICATIONS:  Current Outpatient Medications  Medication Sig Dispense Refill   Accu-Chek Softclix Lancets lancets SMARTSIG:Topical     albuterol (PROVENTIL) (2.5 MG/3ML) 0.083% nebulizer solution Take 3 mLs (2.5 mg total) by nebulization every 6 (six) hours as needed for wheezing or shortness of breath. 75 mL 1   albuterol (VENTOLIN HFA) 108 (90 Base)  MCG/ACT inhaler Inhale 2 puffs into the lungs every 6 (six) hours as needed for wheezing or shortness of breath. 18 g 3   amoxicillin-clavulanate (AUGMENTIN) 875-125 MG tablet Take 1 tablet by mouth 2 (two) times daily. 28 tablet 0   anastrozole (ARIMIDEX) 1 MG tablet Take 1 tablet (1 mg total)  by mouth daily. 90 tablet 3   blood glucose meter kit and supplies KIT Dispense based on patient and insurance preference. Use up to four times daily as directed. (FOR ICD-9 250.00, 250.01). 1 each 0   gabapentin (NEURONTIN) 300 MG capsule Take up to 4 tabs a day (2 tabs twice a day) 120 capsule 3   glucose blood (ACCU-CHEK GUIDE) test strip USE TO TEST BLOOD SUGAR UP TO 4 TIMES A DAY AS DIRECTED 100 strip 3   HYDROcodone-acetaminophen (NORCO) 5-325 MG tablet Take 1-2 tablets by mouth every 6 (six) hours as needed for severe pain. Sedation caution 30 tablet 0   ibuprofen (ADVIL) 600 MG tablet Take 1 tablet (600 mg total) by mouth every 8 (eight) hours as needed (For pain.  Take with food.). 90 tablet 2   metFORMIN (GLUCOPHAGE) 500 MG tablet TAKE 1 TABLET BY MOUTH ONCE DAILY WITH MEAL 90 tablet 1   omeprazole (PRILOSEC OTC) 20 MG tablet Take 1 tablet (20 mg total) by mouth every other day. 45 tablet 1   No current facility-administered medications for this visit.    PHYSICAL EXAMINATION: ECOG PERFORMANCE STATUS: 1 - Symptomatic but completely ambulatory  Vitals:   03/25/21 1221  BP: 122/62  Pulse: 81  Resp: 18  Temp: 97.6 F (36.4 C)  SpO2: 99%   Filed Weights   03/25/21 1221  Weight: 145 lb 9 oz (66 kg)    BREAST: Marked left breast swelling and lymphedema with tenderness to palpation (exam performed in the presence of a chaperone)  LABORATORY DATA:  I have reviewed the data as listed CMP Latest Ref Rng & Units 11/05/2020 08/03/2020 03/27/2020  Glucose 70 - 99 mg/dL 145(H) 114(H) 173(H)  BUN 6 - 20 mg/dL _0 Creatinine 0.44 - 1.00 mg/dL 0.66 0.67 0.71  Sodium 135 - 145 mmol/L 142 140 138  Potassium 3.5 - 5.1 mmol/L 3.8 4.1 4.0  Chloride 98 - 111 mmol/L 108 106 105  CO2 22 - 32 mmol/L _1 Calcium 8.9 - 10.3 mg/dL 9.5 9.6 9.6  Total Protein 6.5 - 8.1 g/dL 6.3(L) 6.9 7.1  Total Bilirubin 0.3 - 1.2 mg/dL 0.5 0.5 0.3  Alkaline Phos 38 - 126 U/L 95 91 95  AST 15 - 41 U/L  _2 ALT 0 - 44 U/L 27 25 47(H)    Lab Results  Component Value Date   WBC 10.4 11/05/2020   HGB 14.0 11/05/2020   HCT 39.4 11/05/2020   MCV 92.1 11/05/2020   PLT 202 11/05/2020   NEUTROABS 7.9 (H) 11/05/2020    ASSESSMENT & PLAN:  Malignant neoplasm of upper-outer quadrant of left breast in female, estrogen receptor positive ([place]) 10/07/2019:Left lumpectomy and sentinel lymph node biopsy [name]): IDC with DCIS, 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  ER 90%, PR 90%, HER-2 negative by FISH Posterior margin positive but no further surgery done because it is muscle on the back. T2N0 stage Ib Oncotype DX score 32: 20% risk of distant recurrence in 9 years   Treatment plan:  1. Adjuvant chemotherapy with dose dense Adriamycin and Cytoxan x4 followed by Taxol  weekly x12 completed 03/27/21 2. followed by radiation therapy started 04/26/20 3.  Followed by adjuvant antiestrogen therapy. ------------------------------------------------------------------------------------------------------------------------------------------------- Anastrozole toxicities: Julia [name] is tolerating it extremely well. Osteoporosis: Prolia every 6 months beginning 11/05/2020--REMS document reviewed with patient in detail, handout provided, Julia [name] knows to take calcium the day of injection.    03/04/21: Left mammogram and U/S: Diffuse skin thickening left breast (Infection vs Inflamma vs Infectious) 03/08/21: Left axillary biopsy: Fat Necrosis Patient plans to undergo mastectomy with reconstruction.  Julia [name] is meeting with plastic surgery.  This is being planned for 04/29/2021 Since most of the biopsies were fat necrosis, there is no change in her treatment plan with anastrozole alone.  Severe pain in the breast: I renewed her prescription for hydrocodone as well as gabapentin.   Return to clinic 05/06/2021 to discuss results of mastectomy.   No orders of the defined types were placed in this encounter.  The  patient has a good understanding of the overall plan. Julia [name] agrees with it. Julia [name] will call with any problems that may develop before the next visit here.  Total time spent: 30 mins including face to face time and time spent for planning, charting and coordination of care  Rulon Eisenmenger, MD, MPH 03/25/2021  I, Thana Ates, am acting as scribe for Dr. Nicholas Lose.  I have reviewed the above documentation for accuracy and completeness, and I agree with the above.

## 2021-03-24 NOTE — Assessment & Plan Note (Signed)
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 T2N0 stage Ib Oncotype DX score 32: 20% risk of distant recurrence in 9 years  Treatment plan:  1. Adjuvant chemotherapy with dose dense Adriamycin and Cytoxan x4 followed by Taxol weekly x12completed 03/27/21 2. followed by radiation therapystarted 04/26/20 3. Followed by adjuvant antiestrogen therapy. ------------------------------------------------------------------------------------------------------------------------------------------------- Anastrozole toxicities: She is tolerating it extremely well. Osteoporosis: Prolia every 6 months beginning 11/05/2020--REMS document reviewed with patient in detail, handout provided, she knows to take calcium the day of injection.    03/04/21: Left mammogram and U/S: Diffuse skin thickening left breast (Infection vs Inflamma vs Infectious) 03/08/21: Left axillary biopsy: Fat Necrosis

## 2021-03-25 ENCOUNTER — Other Ambulatory Visit: Payer: Self-pay

## 2021-03-25 ENCOUNTER — Other Ambulatory Visit: Payer: Self-pay | Admitting: Adult Health

## 2021-03-25 ENCOUNTER — Inpatient Hospital Stay: Payer: Medicaid Other | Attending: Adult Health | Admitting: Hematology and Oncology

## 2021-03-25 ENCOUNTER — Other Ambulatory Visit: Payer: Self-pay | Admitting: Hematology and Oncology

## 2021-03-25 DIAGNOSIS — C50412 Malignant neoplasm of upper-outer quadrant of left female breast: Secondary | ICD-10-CM | POA: Diagnosis not present

## 2021-03-25 DIAGNOSIS — Z7984 Long term (current) use of oral hypoglycemic drugs: Secondary | ICD-10-CM | POA: Insufficient documentation

## 2021-03-25 DIAGNOSIS — Z923 Personal history of irradiation: Secondary | ICD-10-CM | POA: Insufficient documentation

## 2021-03-25 DIAGNOSIS — I890 Lymphedema, not elsewhere classified: Secondary | ICD-10-CM | POA: Insufficient documentation

## 2021-03-25 DIAGNOSIS — Z79899 Other long term (current) drug therapy: Secondary | ICD-10-CM | POA: Insufficient documentation

## 2021-03-25 DIAGNOSIS — Z170 Estrogen receptor positive status [ER+]: Secondary | ICD-10-CM | POA: Insufficient documentation

## 2021-03-25 DIAGNOSIS — Z9221 Personal history of antineoplastic chemotherapy: Secondary | ICD-10-CM | POA: Insufficient documentation

## 2021-03-25 DIAGNOSIS — Z79811 Long term (current) use of aromatase inhibitors: Secondary | ICD-10-CM | POA: Insufficient documentation

## 2021-03-25 MED ORDER — HYDROCODONE-ACETAMINOPHEN 5-325 MG PO TABS: 1.0000 | ORAL_TABLET | Freq: Four times a day (QID) | ORAL | 0 refills | Status: DC | PRN

## 2021-03-25 MED ORDER — GABAPENTIN 300 MG PO CAPS: ORAL_CAPSULE | ORAL | 3 refills | Status: DC

## 2021-04-05 ENCOUNTER — Ambulatory Visit: Payer: Medicaid Other | Admitting: Hematology and Oncology

## 2021-04-09 ENCOUNTER — Other Ambulatory Visit: Payer: Self-pay | Admitting: Hematology and Oncology

## 2021-04-10 MED ORDER — HYDROCODONE-ACETAMINOPHEN 5-325 MG PO TABS: 1.0000 | ORAL_TABLET | Freq: Four times a day (QID) | ORAL | 0 refills | Status: DC | PRN

## 2021-04-17 ENCOUNTER — Encounter: Payer: Self-pay | Admitting: Family Medicine

## 2021-04-17 ENCOUNTER — Telehealth: Payer: Self-pay | Admitting: Family Medicine

## 2021-04-17 NOTE — Telephone Encounter (Signed)
Pt has bronchitis and she took over the counter medication and its not helping. Pt want some mediation called in

## 2021-04-17 NOTE — Telephone Encounter (Signed)
Patient needs to schedule an appt with a provider. I have also sent this to her on mychart. We do not prescribe medications without a visit.

## 2021-04-19 ENCOUNTER — Encounter (HOSPITAL_COMMUNITY): Payer: Self-pay | Admitting: Anesthesiology

## 2021-04-19 ENCOUNTER — Telehealth: Payer: Self-pay | Admitting: Family Medicine

## 2021-04-19 ENCOUNTER — Encounter: Payer: Self-pay | Admitting: Nurse Practitioner

## 2021-04-19 ENCOUNTER — Other Ambulatory Visit: Payer: Self-pay

## 2021-04-19 ENCOUNTER — Telehealth (INDEPENDENT_AMBULATORY_CARE_PROVIDER_SITE_OTHER): Payer: Medicaid Other | Admitting: Nurse Practitioner

## 2021-04-19 ENCOUNTER — Encounter (HOSPITAL_BASED_OUTPATIENT_CLINIC_OR_DEPARTMENT_OTHER): Payer: Self-pay | Admitting: General Surgery

## 2021-04-19 DIAGNOSIS — J40 Bronchitis, not specified as acute or chronic: Secondary | ICD-10-CM | POA: Diagnosis not present

## 2021-04-19 DIAGNOSIS — R051 Acute cough: Secondary | ICD-10-CM

## 2021-04-19 DIAGNOSIS — J209 Acute bronchitis, unspecified: Secondary | ICD-10-CM

## 2021-04-19 HISTORY — DX: Acute bronchitis, unspecified: J20.9

## 2021-04-19 MED ORDER — DOXYCYCLINE HYCLATE 100 MG PO TABS: 100.0000 mg | ORAL_TABLET | Freq: Two times a day (BID) | ORAL | 0 refills | Status: AC

## 2021-04-19 MED ORDER — BENZONATATE 100 MG PO CAPS: 100.0000 mg | ORAL_CAPSULE | Freq: Three times a day (TID) | ORAL | 0 refills | Status: AC | PRN

## 2021-04-19 NOTE — Assessment & Plan Note (Signed)
Given the length of symptoms without improvement in post COVID we will go ahead and elect to treat patient is a current everyday smoker but still high risk.  We will start doxycycline 100 mg twice daily for 7 days.  Signs and symptoms reviewed as when to seek urgent or emergent health care.

## 2021-04-19 NOTE — Telephone Encounter (Signed)
Patient has been scheduled for 04/25/21 at 3pm for surgery clearance.

## 2021-04-19 NOTE — Assessment & Plan Note (Signed)
We will send in Tessalon Perles milligrams 3 times daily as needed cough.

## 2021-04-19 NOTE — Telephone Encounter (Signed)
Noted. Thanks.

## 2021-04-19 NOTE — Progress Notes (Signed)
Patient's chart reviewed with Dr Rodman Comp. Patient has 2ppd smoking history with COPD/Asthma. She states she has had increase in coughing x1 wk and had an E-visit with NP from [place] today. She was given Doxycycline and Tessalon Pearles. Dr Ola Spurr states that he would like for patient to physically be seen prior to surgery. Abigail Butts RN at Dr Ethlyn Gallery office made aware.

## 2021-04-19 NOTE — Progress Notes (Signed)
Patient ID: Julia [name], female    DOB: 01-Aug-1965, 56 y.o.   MRN: [id number]  Virtual visit completed through [place], a video enabled telemedicine application. Due to national recommendations of social distancing due to COVID-19, a virtual visit is felt to be most appropriate for this patient at this time. Reviewed limitations, risks, security and privacy concerns of performing a virtual visit and the availability of in person appointments. I also reviewed that there may be a patient responsible charge related to this service. The patient agreed to proceed.  Phone call was 36mns and 30 seconds Patient location: home [facility] Persons participating in this virtual visit: patient, provider   If any vitals were documented, they were collected by patient at home unless specified below.    There were no vitals taken for this visit.   CC: Cough Subjective:   HPI: KCAMBELLE SUCHECKIis a 56y.o. female presenting on 04/19/2021 for Cough (Symptoms started about a month ago after having covid: cough, chest congestion, runny nose, post nasal drip off and on. No fever or sore throat. Has been taking Delsym and cough drops.)   Symtpoms started approx 1 month ago after covid  Has been vaccinated and one booster [facility] Symptoms have stayed the same Has been using her albuterol inhaler more than normal    Relevant past medical, surgical, family and social history reviewed and updated as indicated. Interim medical history since our last visit reviewed. Allergies and medications reviewed and updated. Outpatient Medications Prior to Visit  Medication Sig Dispense Refill   Accu-Chek Softclix Lancets lancets SMARTSIG:Topical     albuterol (PROVENTIL) (2.5 MG/3ML) 0.083% nebulizer solution Take 3 mLs (2.5 mg total) by nebulization every 6 (six) hours as needed for wheezing or shortness of  breath. 75 mL 1   albuterol (VENTOLIN HFA) 108 (90 Base) MCG/ACT inhaler Inhale 2 puffs into the lungs every 6 (six) hours as needed for wheezing or shortness of breath. 18 g 3   anastrozole (ARIMIDEX) 1 MG tablet TAKE 1 TABLET BY MOUTH EVERY DAY 90 tablet 3   blood glucose meter kit and supplies KIT Dispense based on patient and insurance preference. Use up to four times daily as directed. (FOR ICD-9 250.00, 250.01). 1 each 0   gabapentin (NEURONTIN) 300 MG capsule Take up to 4 tabs a day (2 tabs twice a day) 120 capsule 3   glucose blood (ACCU-CHEK GUIDE) test strip USE TO TEST BLOOD SUGAR UP TO 4 TIMES A DAY AS DIRECTED 100 strip 3   HYDROcodone-acetaminophen (NORCO) 5-325 MG tablet Take 1-2 tablets by mouth every 6 (six) hours as needed for severe pain. Sedation caution 60 tablet 0   ibuprofen (ADVIL) 600 MG tablet Take 1 tablet (600 mg total) by mouth every 8 (eight) hours as needed (For pain.  Take with food.). 90 tablet 2   metFORMIN (GLUCOPHAGE) 500 MG tablet TAKE 1 TABLET BY MOUTH ONCE DAILY WITH MEAL 90 tablet 1   omeprazole (PRILOSEC OTC) 20 MG tablet Take 1 tablet (20 mg total) by mouth every other day. 45 tablet 1   amoxicillin-clavulanate (AUGMENTIN) 875-125 MG tablet Take 1 tablet by mouth 2 (two) times daily. 28 tablet 0   No facility-administered medications prior to visit.     Per HPI unless specifically indicated in ROS section below Review of Systems  Constitutional:  Positive for fatigue. Negative for chills  and fever.  HENT:  Negative for congestion, ear discharge, ear pain, sinus pressure, sinus pain and sore throat.   Respiratory:  Positive for cough (dry cough). Negative for shortness of breath.   Cardiovascular:  Negative for chest pain.  Gastrointestinal:  Negative for abdominal pain, diarrhea, nausea and vomiting.  Musculoskeletal:  Negative for arthralgias and myalgias.  Neurological:  Negative for headaches.  Objective:  There were no vitals taken for this  visit.  Wt Readings from Last 3 Encounters:  03/25/21 145 lb 9 oz (66 kg)  02/22/21 141 lb 9.6 oz (64.2 kg)  11/05/20 145 lb (65.8 kg)       Physical exam: Gen: alert, NAD, not ill appearing Pulm: speaks in complete sentences without increased work of breathing Psych: normal mood, normal thought content      Results for orders placed or performed in visit on 11/05/20  CBC with Differential (Cancer Center Only)  Result Value Ref Range   WBC Count 10.4 4.0 - 10.5 K/uL   RBC 4.28 3.87 - 5.11 MIL/uL   Hemoglobin 14.0 12.0 - 15.0 g/dL   HCT 39.4 36.0 - 46.0 %   MCV 92.1 80.0 - 100.0 fL   MCH 32.7 26.0 - 34.0 pg   MCHC 35.5 30.0 - 36.0 g/dL   RDW 12.7 11.5 - 15.5 %   Platelet Count 202 150 - 400 K/uL   nRBC 0.0 0.0 - 0.2 %   Neutrophils Relative % 75 %   Neutro Abs 7.9 (H) 1.7 - 7.7 K/uL   Lymphocytes Relative 17 %   Lymphs Abs 1.7 0.7 - 4.0 K/uL   Monocytes Relative 5 %   Monocytes Absolute 0.5 0.1 - 1.0 K/uL   Eosinophils Relative 3 %   Eosinophils Absolute 0.3 0.0 - 0.5 K/uL   Basophils Relative 0 %   Basophils Absolute 0.0 0.0 - 0.1 K/uL   Immature Granulocytes 0 %   Abs Immature Granulocytes 0.02 0.00 - 0.07 K/uL  CMP (Cancer Center only)  Result Value Ref Range   Sodium 142 135 - 145 mmol/L   Potassium 3.8 3.5 - 5.1 mmol/L   Chloride 108 98 - 111 mmol/L   CO2 27 22 - 32 mmol/L   Glucose, Bld 145 (H) 70 - 99 mg/dL   BUN 8 6 - 20 mg/dL   Creatinine 0.66 0.44 - 1.00 mg/dL   Calcium 9.5 8.9 - 10.3 mg/dL   Total Protein 6.3 (L) 6.5 - 8.1 g/dL   Albumin 3.5 3.5 - 5.0 g/dL   AST 21 15 - 41 U/L   ALT 27 0 - 44 U/L   Alkaline Phosphatase 95 38 - 126 U/L   Total Bilirubin 0.5 0.3 - 1.2 mg/dL   GFR, Estimated >60 >60 mL/min   Anion gap 7 5 - 15   Assessment & Plan:   Problem List Items Addressed This Visit       Other   Cough   Relevant Medications   benzonatate (TESSALON) 100 MG capsule   Other Visit Diagnoses     Bronchitis    -  Primary   Relevant  Medications   doxycycline (VIBRA-TABS) 100 MG tablet        No orders of the defined types were placed in this encounter.  No orders of the defined types were placed in this encounter.   I discussed the assessment and treatment plan with the patient. The patient was provided an opportunity to ask questions and all were answered. The  patient agreed with the plan and demonstrated an understanding of the instructions. The patient was advised to call back or seek an in-person evaluation if the symptoms worsen or if the condition fails to improve as anticipated.  Follow up plan: No follow-ups on file.  Romilda Garret, NP

## 2021-04-19 NOTE — Telephone Encounter (Signed)
[facility]  [phone] called  Patient has cancer and is scheduled for a double mastectomy scheduled for 1.23.23  She went for her pre-op today, and the anesthesiologist is uncomfortable with proceeding without a chest x-ray and surgical clearance  Surgical clearance form is being faxed over to [place] from [facility]  Patient needs an appointment asap with her pcp  Abigail Butts needs a call back with the appointment date and time to update the surgery team

## 2021-04-24 DIAGNOSIS — C50912 Malignant neoplasm of unspecified site of left female breast: Secondary | ICD-10-CM | POA: Diagnosis not present

## 2021-04-25 ENCOUNTER — Ambulatory Visit: Payer: Medicaid Other | Admitting: Family Medicine

## 2021-04-25 ENCOUNTER — Encounter (HOSPITAL_BASED_OUTPATIENT_CLINIC_OR_DEPARTMENT_OTHER)
Admission: RE | Admit: 2021-04-25 | Discharge: 2021-04-25 | Disposition: A | Discharge status: Home / Self Care | Payer: Medicaid Other | Source: Ambulatory Visit | Attending: General Surgery | Admitting: General Surgery

## 2021-04-25 DIAGNOSIS — Z01818 Encounter for other preprocedural examination: Secondary | ICD-10-CM | POA: Diagnosis not present

## 2021-04-25 LAB — BASIC METABOLIC PANEL
Anion gap: 10 (ref 5–15)
BUN: 8 mg/dL (ref 6–20)
CO2: 23 mmol/L (ref 22–32)
Calcium: 9.3 mg/dL (ref 8.9–10.3)
Chloride: 102 mmol/L (ref 98–111)
Creatinine, Ser: 0.71 mg/dL (ref 0.44–1.00)
GFR, Estimated: >60 mL/min (ref >60)
Glucose, Bld: 93 mg/dL (ref 70–99)
Potassium: 4.1 mmol/L (ref 3.5–5.1)
Sodium: 135 mmol/L (ref 135–145)

## 2021-04-25 NOTE — Progress Notes (Signed)

## 2021-04-26 ENCOUNTER — Telehealth: Payer: Self-pay | Admitting: Family Medicine

## 2021-04-28 ENCOUNTER — Other Ambulatory Visit: Payer: Self-pay | Admitting: Hematology and Oncology

## 2021-04-29 ENCOUNTER — Other Ambulatory Visit: Payer: Self-pay

## 2021-04-29 ENCOUNTER — Ambulatory Visit (INDEPENDENT_AMBULATORY_CARE_PROVIDER_SITE_OTHER): Payer: Medicaid Other | Admitting: Nurse Practitioner

## 2021-04-29 ENCOUNTER — Encounter: Payer: Self-pay | Admitting: Nurse Practitioner

## 2021-04-29 ENCOUNTER — Telehealth: Payer: Self-pay

## 2021-04-29 ENCOUNTER — Other Ambulatory Visit: Payer: Self-pay | Admitting: Hematology and Oncology

## 2021-04-29 ENCOUNTER — Ambulatory Visit: Payer: Medicaid Other | Admitting: Family

## 2021-04-29 ENCOUNTER — Ambulatory Visit (HOSPITAL_BASED_OUTPATIENT_CLINIC_OR_DEPARTMENT_OTHER): Admission: RE | Admit: 2021-04-29 | Payer: Medicaid Other | Source: Home / Self Care | Admitting: General Surgery

## 2021-04-29 ENCOUNTER — Ambulatory Visit (INDEPENDENT_AMBULATORY_CARE_PROVIDER_SITE_OTHER)
Admission: RE | Admit: 2021-04-29 | Discharge: 2021-04-29 | Disposition: A | Discharge status: Home / Self Care | Payer: Medicaid Other | Source: Ambulatory Visit | Attending: Nurse Practitioner | Admitting: Nurse Practitioner

## 2021-04-29 VITALS — BP 120/64 | HR 86 | Temp 98.3°F | Resp 14 | Ht 64.0 in | Wt 143.1 lb

## 2021-04-29 DIAGNOSIS — E119 Type 2 diabetes mellitus without complications: Secondary | ICD-10-CM

## 2021-04-29 DIAGNOSIS — Z7189 Other specified counseling: Secondary | ICD-10-CM | POA: Diagnosis not present

## 2021-04-29 DIAGNOSIS — F17200 Nicotine dependence, unspecified, uncomplicated: Secondary | ICD-10-CM | POA: Diagnosis not present

## 2021-04-29 HISTORY — DX: Type 2 diabetes mellitus without complications: E11.9

## 2021-04-29 LAB — POCT GLYCOSYLATED HEMOGLOBIN (HGB A1C): Hemoglobin A1C: 6.2 % — AB (ref 4.0–5.6)

## 2021-04-29 SURGERY — MASTECTOMY, SIMPLE
Anesthesia: General | Site: Breast | Laterality: Left

## 2021-04-29 MED ORDER — HYDROCODONE-ACETAMINOPHEN 5-325 MG PO TABS: 1.0000 | ORAL_TABLET | Freq: Four times a day (QID) | ORAL | 0 refills | Status: DC | PRN

## 2021-04-29 NOTE — Telephone Encounter (Signed)
I have completed my note and chest xray is resulted. We can get the information faxed. Can we call the patient once we have completed this please

## 2021-04-29 NOTE — Assessment & Plan Note (Signed)
Did ask if she had any formal legal documents, patient does not but did state that her husband does not know if she becomes incapacitated her wishes and will.

## 2021-04-29 NOTE — Assessment & Plan Note (Addendum)
Patient was evaluated for left mastectomy anesthesia wanted surgical clearance and chest x-ray.  Pending chest x-ray.  Patient is diabetic and currently smokes 2 packs of cigarettes daily for approximately 30 years.  Did discuss with patient that this can inhibit wound healing and increased risk for complications.  Did run patient's medical history and surgical calculator she is either at average or below average risk for complications.  She is above average when it comes to surgical wound complication which we discussed with patient.  Pending chest x-ray clear patient for surgical procedure of left mastectomy.  Patients chest xray results with no acute change noted per radiology. Patient is cleared for surgical intervention

## 2021-04-29 NOTE — Patient Instructions (Addendum)
Nice to see you today Your A1C is 6.2% today which is still good and means your sugars are well controlled I will await for your chest xray back and I will send Dr. [name] a message once it all comes back Follow up with Dr. Damita Dunnings

## 2021-04-29 NOTE — Telephone Encounter (Signed)
Patient sates that she needs letter clearing her for surgery after xray was done today. Letter soul go to Dr. [name]. Patient states that she provided all contact information at visit today. Would like call back to  make aware when completed.

## 2021-04-29 NOTE — Progress Notes (Addendum)
Established Patient Office Visit  Subjective:  Patient ID: YEE GANGI, female    DOB: 05-15-1965  Age: 55 y.o. MRN: [id number]  CC:  Chief Complaint  Patient [name] with   Surgical Clearance    For Right mastectomy and possibly left also. Dr Flo Shanks with [facility]. Needs repeat chest xray.    HPI Julia [name] for Surgical clearance  PCP is Dr. Damita Dunnings  She was scheduled to undergo a left total mastectomy but anesthesia wanted a CXR and surgical clearance prior to doing the procedure  DM: Checking her glucose in the am and it has been running under 100. States PCP states if glucose under 100 do not take the medication   LMP was at 22 years old mother had a hysterectomy at age 54.  No history of heart conditions per patient. She was evaluated by Dr. Esmond Plants approx 3 years ago for an episode of syncope. Per note seems to be driven by orthostatics and caffeine intake. She was cleared and could follow up as needed.   She does currently smoke 2 ppd for approx 31 years. She states she would like to quit. Discussed how she cannot smoke in the hospital and that could kick start her cessation journey.  EKG performed at pre opt appointment and reviewed. NSR.   Past Medical History:  Diagnosis Date   Acute bronchitis 04/19/2021   Anxiety    over cancer diagnosis   Asthma    bronchial asthma    Cancer ([place]) 09/2019   left breast invasive mammary cancer   Chronic back pain    COPD (chronic obstructive pulmonary disease) ([place])    smokes 1 1/2 ppd   Diabetes mellitus without complication (HCC)    GERD (gastroesophageal reflux disease)    Personal history of chemotherapy    Personal history of radiation therapy    Pneumonia    hx of 2018    Smoking     Past Surgical History:  Procedure Laterality Date   BREAST BIOPSY Left 09/2019   x2   BREAST BIOPSY Left 11/22/2020   BREAST LUMPECTOMY Left 10/2019   BREAST LUMPECTOMY WITH  SENTINEL LYMPH NODE BIOPSY Left 10/07/2019   Procedure: LEFT BREAST LUMPECTOMY WITH SENTINEL LYMPH NODE BX;  Surgeon: Jovita Kussmaul, MD;  Location: [place];  Service: General;  Laterality: Left;  [place] Right 11/10/2019   Procedure: INSERTION PORT-A-CATH;  Surgeon: Jovita Kussmaul, MD;  Location: [facility];  Service: General;  Laterality: Right;   TONSILLECTOMY     VULVECTOMY N/A 01/30/2015   Procedure: WIDE LOCAL EXCISION VULVA;  Surgeon: Everitt Amber, MD;  Location: [facility];  Service: Gynecology;  Laterality: N/A;    Family History  Problem Relation Age of Onset   Hypertension Mother    Diabetes Mother        insulin dependent   Heart disease Mother    Diabetes Father    Heart disease Father        CAD   Hypertension Brother    Kidney disease Brother        kidney failure/resolved   Colon cancer Maternal Aunt        dx'd at ~26   Breast cancer Maternal Aunt 65    Social History   Socioeconomic History   Marital status: Married    Spouse name: Not on file   Number of children: 1  Years of education: 84   Highest education level: Not on file  Occupational History   Occupation: Scientist, water quality at [employer]   Smoking status: Every Day    Packs/day: 2.00    Years: 31.00    Pack years: 62.00    Types: Cigarettes   Smokeless tobacco: Never  Vaping Use   Vaping Use: Never used  Substance and Sexual Activity   Alcohol use: No    Alcohol/week: 0.0 standard drinks   Drug use: No   Sexual activity: Yes    Birth control/protection: Post-menopausal  Other Topics Concern   Not on file  Social History Narrative   One son   Married 2000 (had been together since 13)   Social Determinants of Radio broadcast assistant Strain: Not on file  Food Insecurity: Not on file  Transportation Needs: Not on file  Physical Activity: Not on file  Stress: Not on file  Social  Connections: Not on file  Intimate Partner Violence: Not on file    Outpatient Medications Prior to Visit  Medication Sig Dispense Refill   Accu-Chek Softclix Lancets lancets SMARTSIG:Topical     albuterol (PROVENTIL) (2.5 MG/3ML) 0.083% nebulizer solution Take 3 mLs (2.5 mg total) by nebulization every 6 (six) hours as needed for wheezing or shortness of breath. 75 mL 1   albuterol (VENTOLIN HFA) 108 (90 Base) MCG/ACT inhaler Inhale 2 puffs into the lungs every 6 (six) hours as needed for wheezing or shortness of breath. 18 g 3   anastrozole (ARIMIDEX) 1 MG tablet TAKE 1 TABLET BY MOUTH EVERY DAY 90 tablet 3   blood glucose meter kit and supplies KIT Dispense based on patient and insurance preference. Use up to four times daily as directed. (FOR ICD-9 250.00, 250.01). 1 each 0   gabapentin (NEURONTIN) 300 MG capsule Take up to 4 tabs a day (2 tabs twice a day) 120 capsule 3   glucose blood (ACCU-CHEK GUIDE) test strip USE TO TEST BLOOD SUGAR UP TO 4 TIMES A DAY AS DIRECTED 100 strip 3   HYDROcodone-acetaminophen (NORCO) 5-325 MG tablet Take 1-2 tablets by mouth every 6 (six) hours as needed for severe pain. Sedation caution 60 tablet 0   ibuprofen (ADVIL) 600 MG tablet Take 1 tablet (600 mg total) by mouth every 8 (eight) hours as needed (For pain.  Take with food.). 90 tablet 2   metFORMIN (GLUCOPHAGE) 500 MG tablet TAKE 1 TABLET BY MOUTH ONCE DAILY WITH MEAL 90 tablet 1   omeprazole (PRILOSEC OTC) 20 MG tablet Take 1 tablet (20 mg total) by mouth every other day. 45 tablet 1   No facility-administered medications prior to visit.    Allergies  Allergen Reactions   Propoxyphene N-Acetaminophen Hives   Nabumetone Rash    She can tolerate ibuprofen w/o difficulty   Rofecoxib Rash    Nightmares (vioxx)    ROS Review of Systems  Constitutional:  Negative for chills and fever.  Respiratory:  Negative for cough and shortness of breath.   Cardiovascular:  Negative for chest  pain, palpitations and leg swelling.  Gastrointestinal:  Negative for abdominal pain, diarrhea, nausea and vomiting.       Every other day BM. Normally formed   Genitourinary:  Negative for dysuria and hematuria.       Nocturia "-"  Neurological:  Negative for dizziness, light-headedness and headaches.     Objective:    Physical Exam Vitals and nursing note reviewed.  Constitutional:  Appearance: Normal appearance.  HENT:     Right Ear: Tympanic membrane, ear canal and external ear normal.     Left Ear: Tympanic membrane, ear canal and external ear normal.     Mouth/Throat:     Mouth: Mucous membranes are moist.     Pharynx: Oropharynx is clear.  Eyes:     Extraocular Movements: Extraocular movements intact.     Pupils: Pupils are equal, round, and reactive to light.     Comments: Wear corrective lenses  Neck:     Thyroid: No thyroid mass, thyromegaly or thyroid tenderness.     Vascular: No carotid bruit.  Cardiovascular:     Rate and Rhythm: Normal rate and regular rhythm.     Pulses:          Dorsalis pedis pulses are 2+ on the right side and 2+ on the left side.       Posterior tibial pulses are 1+ on the right side and 1+ on the left side.     Heart sounds: Normal heart sounds.  Pulmonary:     Effort: Pulmonary effort is normal.     Breath sounds: Normal breath sounds.  Abdominal:     General: Bowel sounds are normal. There is no distension.     Palpations: There is no mass.     Tenderness: There is no abdominal tenderness.  Musculoskeletal:     Right lower leg: No edema.     Left lower leg: No edema.  Lymphadenopathy:     Cervical: No cervical adenopathy.  Neurological:     General: No focal deficit present.     Mental Status: She is alert.  Psychiatric:        Mood and Affect: Mood normal.        Behavior: Behavior normal.        Thought Content: Thought content normal.        Judgment: Judgment normal.    BP 120/64    Pulse 86    Temp 98.3 F (36.8  C)    Resp 14    Ht '5\' 4"'  (1.626 m)    Wt 143 lb 2 oz (64.9 kg)    SpO2 95%    BMI 24.57 kg/m  Wt Readings from Last 3 Encounters:  04/29/21 143 lb 2 oz (64.9 kg)  03/25/21 145 lb 9 oz (66 kg)  02/22/21 141 lb 9.6 oz (64.2 kg)     Health Maintenance Due  Topic Date Due   COVID-19 Vaccine (1) Never done   OPHTHALMOLOGY EXAM  Never done   URINE MICROALBUMIN  Never done   Hepatitis C Screening  Never done   COLONOSCOPY (Pts 45-28yr Insurance coverage will need to be confirmed)  Never done   PAP SMEAR-Modifier  05/06/2014   TETANUS/TDAP  06/24/2018   FOOT EXAM  01/29/2021   HEMOGLOBIN A1C  04/21/2021    There are no preventive care reminders to display for this patient.  Lab Results  Component Value Date   TSH 0.65 04/13/2017   Lab Results  Component Value Date   WBC 10.4 11/05/2020   HGB 14.0 11/05/2020   HCT 39.4 11/05/2020   MCV 92.1 11/05/2020   PLT 202 11/05/2020   Lab Results  Component Value Date   NA 135 04/25/2021   K 4.1 04/25/2021   CO2 23 04/25/2021   GLUCOSE 93 04/25/2021   BUN 8 04/25/2021   CREATININE 0.71 04/25/2021   BILITOT 0.5 11/05/2020   ALKPHOS 95  11/05/2020   AST 21 11/05/2020   ALT 27 11/05/2020   PROT 6.3 (L) 11/05/2020   ALBUMIN 3.5 11/05/2020   CALCIUM 9.3 04/25/2021   ANIONGAP 10 04/25/2021   GFR 96.34 07/18/2019   Lab Results  Component Value Date   CHOL 228 (H) 12/29/2014   Lab Results  Component Value Date   HDL 29.80 (L) 12/29/2014   No results found for: [facility] Lab Results  Component Value Date   TRIG (H) 12/29/2014    557.0 Triglyceride is over 400; calculations on Lipids are invalid.   Lab Results  Component Value Date   CHOLHDL 8 12/29/2014   Lab Results  Component Value Date   HGBA1C 5.6 10/19/2020      Assessment & Plan:   Problem List Items Addressed This Visit       Endocrine   Diabetes mellitus without complication ([place]) - Primary    Currently stable stable controlled.  Patient does  take metformin 500 mg twice daily according to her glucose levels.  Did check A1c in office of 6.2% today.  Still did well controlled in the new.  Did discuss the diabetes along with her tobacco abuse will likely delay wound healing and increase risk of surgical incision complications.      Relevant Orders   POCT glycosylated hemoglobin (Hb A1C) (Completed)     Other   TOBACCO ABUSE    Currently still a smoker.  2 packs/day for approximately 30 years.  States she would like to quit in the future.  Did discuss the hospital will not allow her to smoke while inpatient and that would be an opportunity for her to continue with cessation after her postoperative period.      Relevant Orders   DG Chest 2 View (Completed)   Advance care planning    Did ask if she had any formal legal documents, patient does not but did state that her husband does not know if she becomes incapacitated her wishes and will.      Surgical counseling visit    Patient was evaluated for left mastectomy anesthesia wanted surgical clearance and chest x-ray.  Pending chest x-ray.  Patient is diabetic and currently smokes 2 packs of cigarettes daily for approximately 30 years.  Did discuss with patient that this can inhibit wound healing and increased risk for complications.  Did run patient's medical history and surgical calculator she is either at average or below average risk for complications.  She is above average when it comes to surgical wound complication which we discussed with patient.  Pending chest x-ray clear patient for surgical procedure of left mastectomy.  Patients chest xray results with no acute change noted per radiology. Patient is cleared for surgical intervention        No orders of the defined types were placed in this encounter.  This visit occurred during the SARS-CoV-2 public health emergency.  Safety protocols were in place, including screening questions prior to the visit, additional usage of  staff PPE, and extensive cleaning of exam room while observing appropriate contact time as indicated for disinfecting solutions.   Follow-up: No follow-ups on file.    Romilda Garret, NP

## 2021-04-29 NOTE — Assessment & Plan Note (Signed)
Currently still a smoker.  2 packs/day for approximately 30 years.  States she would like to quit in the future.  Did discuss the hospital will not allow her to smoke while inpatient and that would be an opportunity for her to continue with cessation after her postoperative period.

## 2021-04-29 NOTE — Telephone Encounter (Signed)
Information has been sent over to [facility]. Patient advised. Will follow up with [place] if anything else is needed or not received.

## 2021-04-29 NOTE — Assessment & Plan Note (Signed)
Currently stable stable controlled.  Patient does take metformin 500 mg twice daily according to her glucose levels.  Did check A1c in office of 6.2% today.  Still did well controlled in the new.  Did discuss the diabetes along with her tobacco abuse will likely delay wound healing and increase risk of surgical incision complications.

## 2021-04-29 NOTE — Telephone Encounter (Signed)
error 

## 2021-04-30 ENCOUNTER — Telehealth: Payer: Self-pay

## 2021-04-30 MED ORDER — HYDROCODONE-ACETAMINOPHEN 5-325 MG PO TABS: 1.0000 | ORAL_TABLET | Freq: Four times a day (QID) | ORAL | 0 refills | Status: DC | PRN

## 2021-04-30 NOTE — Telephone Encounter (Signed)
Patient notified of Prior Authorization approval for Hydrocodone 5/325 mg Tablets. Medication is approved through 10/27/2021. Patient's pharmacy of choice notified.

## 2021-04-30 NOTE — Telephone Encounter (Signed)
Can you please refuse this refill.  The pharmacy sent a duplicate and I can not refuse it.  Thanks!

## 2021-05-03 ENCOUNTER — Other Ambulatory Visit: Payer: Self-pay

## 2021-05-03 NOTE — Anesthesia Preprocedure Evaluation (Addendum)
Anesthesia Evaluation  Patient identified by MRN, date of birth, ID band Patient awake    Reviewed: Allergy & Precautions, NPO status , Patient's Chart, lab work & pertinent test results  Airway Mallampati: II  TM Distance: >3 FB Neck ROM: Full    Dental no notable dental hx. (+) Teeth Intact, Poor Dentition, Missing, Dental Advisory Given   Pulmonary asthma , pneumonia, COPD,  COPD inhaler, Current Smoker,    Pulmonary exam normal breath sounds clear to auscultation       Cardiovascular hypertension, Normal cardiovascular exam Rhythm:Regular Rate:Normal   Echo 11/07/19: IMPRESSIONS  1. Left ventricular ejection fraction, by estimation, is 55 to 60%. The  left ventricle has normal function. The left ventricle has no regional  wall motion abnormalities. Left ventricular diastolic parameters are  consistent with Grade I diastolic  dysfunction (impaired relaxation). The average left ventricular global  longitudinal strain is -19.0 %. The global longitudinal strain is normal.  2. Right ventricular systolic function is normal. The right ventricular  size is normal.  3. The mitral valve is normal in structure. No evidence of mitral valve  regurgitation. No evidence of mitral stenosis.  4. The aortic valve is normal in structure. Aortic valve regurgitation is  not visualized. No aortic stenosis is present.  5. The inferior vena cava is normal in size with greater than 50%  respiratory variability, suggesting right atrial pressure of 3 mmHg.    Neuro/Psych PSYCHIATRIC DISORDERS Anxiety negative neurological ROS     GI/Hepatic Neg liver ROS, GERD  Medicated and Controlled,  Endo/Other  negative endocrine ROSdiabetes  Renal/GU negative Renal ROS  negative genitourinary   Musculoskeletal  (+) Arthritis ,   Abdominal   Peds negative pediatric ROS (+)  Hematology negative hematology ROS (+)   Anesthesia Other  Findings   Reproductive/Obstetrics negative OB ROS                            Anesthesia Physical Anesthesia Plan  ASA: 3  Anesthesia Plan: General   Post-op Pain Management:    Induction: Intravenous  PONV Risk Score and Plan: 3 and Ondansetron, Dexamethasone, Treatment may vary due to age or medical condition and Midazolam  Airway Management Planned: Oral ETT and LMA  Additional Equipment:   Intra-op Plan:   Post-operative Plan: Extubation in OR  Informed Consent:   Plan Discussed with: CRNA and Anesthesiologist  Anesthesia Plan Comments: (PAT note written 05/03/2021 by Myra Gianotti, PA-C. DISCUSSION: Patient is a 56 year old female scheduled for the above procedure. Surgery was initially [facility] at [facility] but preoperative medical evaluation recommended given recent prescriptions on 04/19/21 for doxycycline and Tessalon Perles for coughing/bronchitis in a patient with smoking (2PPD) and COPD/asthma history.  Since then case moved to [place], and she had in person visit with Karl Ito, NP on 04/29/21. He wrote,  "Patientwas evaluated for left mastectomy anesthesia wanted surgical clearance and chest x-ray. Pending chest x-ray. Patient is diabetic and currently smokes 2 packs of cigarettes daily for approximately 30 years. Did discuss with patient that this can inhibit wound healing and increased risk for complications. Did run patient's medical history and surgical calculator she is either at average or below average risk for complications. She is above average when it comes to surgical wound complication which we discussed with patient. Pending chest x-ray clear patient for surgical procedure of left mastectomy.  Patients chest xray results with no acute change noted per radiology.  Patient is cleared for surgical intervention".  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  Recent mammogram and U/S showed diffuse skin thickening of the left breast concerning for inflammatory breast cancer versus infection or radiation changes. 03/08/21 left axillary biopsy showed fat necrosis. Left mastectomy with future reconstruction desired. )       Anesthesia Quick Evaluation

## 2021-05-03 NOTE — Progress Notes (Addendum)
Mrs. Vondra denies chest pain or shortness of breath.Patient denies having any s/s of Covid in her household.  Patient denies any known exposure to Covid.   PCP is Dr. Elsie Stain, Mrs Davoli was seen 1/23/23by Karl Ito, NP  for medical  clearance and a Chest xray.   Mrs Canny has type II diabetes, I instructed patient to hold Metformin the day of surgery. Patient does not check CBG on a regular basis. A1C was 6.2 on 04/29/21. I instructed patient to check CBG after awaking and every 2 hours until arrival  to the hospital.  I Instructed patient if CBG is less than 70 to take 4 Glucose Tablets or 1 tube of Glucose Gel or 1/2 cup of a clear juice. Recheck CBG in 15 minutes if CBG is not over 70 call, pre- op desk at [phone] for further instructions.   I instructed Mrs. Beckmann to shower with antibiotic soap, if it is available.  Dry off with a clean towel. Do not put lotion, powder, cologne or deodorant or makeup.No jewelry or piercings. Men may shave their face and neck. Woman should not shave. No nail polish, artificial or acrylic nails. Wear clean clothes, brush your teeth. Glasses, contact lens,dentures or partials may not be worn in the OR. If you need to wear them, please bring a case for glasses, do not wear contacts or bring a case, the hospital does not have contact cases, dentures or partials will have to be removed , make sure they are clean, we will provide a denture cup to put them in. You will need some one to drive you home and a responsible person over the age of 73 to stay with you for the first 24 hours after surgery.  Mrs Rhinehart reports that she has piercing's that she cannot remove and replace with  spacers, patient stated that they were always taped in the past. UI instructed Mrs Canto to hold Advil.

## 2021-05-03 NOTE — Progress Notes (Signed)
Anesthesia Chart Review: Julia [name]  Case: [id number] Date/Time: 05/06/21 1315   Procedure: LEFT TOTAL MASTECTOMY (Left: Breast)   Anesthesia type: General   Pre-op diagnosis: LEFT BREAST FIBROSIS   Location: [place] OR ROOM 09 / [place] OR   Surgeons: Jovita Kussmaul, MD       DISCUSSION: Patient is a 56 year old female scheduled for the above procedure. Surgery was initially scheduled at [facility] but preoperative medical evaluation recommended given recent prescriptions on 04/19/21 for doxycycline and Tessalon Perles for coughing/bronchitis in a patient with smoking (2PPD) and COPD/asthma history.  Since then case moved to [place], and she had in person visit with Karl Ito, NP on 04/29/21. He wrote,  "Patient was evaluated for left mastectomy anesthesia wanted surgical clearance and chest x-ray.  Pending chest x-ray.  Patient is diabetic and currently smokes 2 packs of cigarettes daily for approximately 30 years.  Did discuss with patient that this can inhibit wound healing and increased risk for complications.  Did run patient's medical history and surgical calculator she is either at average or below average risk for complications.  She is above average when it comes to surgical wound complication which we discussed with patient.  Pending chest x-ray clear patient for surgical procedure of left mastectomy.   Patients chest xray results with no acute change noted per radiology. Patient is cleared for surgical intervention".  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  Recent mammogram and U/S showed diffuse skin thickening of the left breast concerning for inflammatory breast cancer versus infection or radiation changes. 03/08/21 left axillary biopsy showed  fat necrosis. Left mastectomy with future reconstruction desired.   A1c 6.2% on 04/29/21. Last BMET 04/25/21, CBC 11/05/20. Labs as indicated and anesthesia team evaluation on the day of surgery.   VS:  BP Readings from Last 3 Encounters:  04/29/21 120/64  03/25/21 122/62  02/22/21 114/63   Pulse Readings from Last 3 Encounters:  04/29/21 86  03/25/21 81  02/22/21 85     PROVIDERS: Tonia Ghent, MD is PCP Nicholas Lose, MD is Baker Pierini, MD is RAD-ONC   LABS: Most recent lab results include: Lab Results  Component Value Date   WBC 10.4 11/05/2020   HGB 14.0 11/05/2020   HCT 39.4 11/05/2020   PLT 202 11/05/2020   GLUCOSE 93 04/25/2021   ALT 27 11/05/2020   AST 21 11/05/2020   NA 135 04/25/2021   K 4.1 04/25/2021   CL 102 04/25/2021   CREATININE 0.71 04/25/2021   BUN 8 04/25/2021   CO2 23 04/25/2021   HGBA1C 6.2 (A) 04/29/2021     IMAGES: CXR 04/29/21: IMPRESSION: Mild density overlying the left lateral lower lung on frontal view only may be related to overlying left breast tissues, with postsurgical changes of the anterior left chest wall and breast possibly increasing soft tissue density in this region. No definite airspace opacity is seen within the lungs in this region on lateral view. Therefore, there is no definite acute lung process.    EKG: 04/25/21: NSR   CV: Echo 11/07/19: IMPRESSIONS   1. Left ventricular ejection fraction, by estimation, is 55 to 60%. The  left ventricle has normal function. The left ventricle has no regional  wall motion abnormalities. Left ventricular diastolic parameters are  consistent with Grade I diastolic  dysfunction (impaired relaxation). The average left ventricular global  longitudinal strain is -19.0 %. The global longitudinal strain is normal.   2. Right ventricular systolic function is normal. The right ventricular  size is normal.   3. The mitral valve is normal in structure. No evidence of mitral valve   regurgitation. No evidence of mitral stenosis.   4. The aortic valve is normal in structure. Aortic valve regurgitation is  not visualized. No aortic stenosis is present.   5. The inferior vena cava is normal in size with greater than 50%  respiratory variability, suggesting right atrial pressure of 3 mmHg.    Past Medical History:  Diagnosis Date   Acute bronchitis 04/19/2021   Anxiety    over cancer diagnosis   Asthma    bronchial asthma    Cancer ([place]) 09/2019   left breast invasive mammary cancer   Chronic back pain    COPD (chronic obstructive pulmonary disease) ([place])    smokes 1 1/2 ppd   Diabetes mellitus without complication (HCC)    GERD (gastroesophageal reflux disease)    Personal history of chemotherapy    Personal history of radiation therapy    Pneumonia    hx of 2018    Smoking     Past Surgical History:  Procedure Laterality Date   BREAST BIOPSY Left 09/2019   x2   BREAST BIOPSY Left 11/22/2020   BREAST LUMPECTOMY Left 10/2019   BREAST LUMPECTOMY WITH SENTINEL LYMPH NODE BIOPSY Left 10/07/2019   Procedure: LEFT BREAST LUMPECTOMY WITH SENTINEL LYMPH NODE BX;  Surgeon: Jovita Kussmaul, MD;  Location: [place];  Service: General;  Laterality: Left;  [place] Right 11/10/2019   Procedure: INSERTION PORT-A-CATH;  Surgeon: Jovita Kussmaul, MD;  Location: [facility];  Service: General;  Laterality: Right;   TONSILLECTOMY     VULVECTOMY N/A 01/30/2015   Procedure: WIDE LOCAL EXCISION VULVA;  Surgeon: Everitt Amber, MD;  Location: [facility];  Service: Gynecology;  Laterality: N/A;    MEDICATIONS:  Accu-Chek Softclix Lancets lancets   acetaminophen (TYLENOL) 500 MG tablet   albuterol (PROVENTIL) (2.5 MG/3ML) 0.083% nebulizer solution   albuterol (VENTOLIN HFA) 108 (90 Base) MCG/ACT inhaler   anastrozole (ARIMIDEX) 1 MG tablet   blood glucose meter kit and supplies KIT   gabapentin (NEURONTIN) 300 MG capsule    glucose blood (ACCU-CHEK GUIDE) test strip   HYDROcodone-acetaminophen (NORCO) 5-325 MG tablet   HYDROcodone-acetaminophen (NORCO) 5-325 MG tablet   ibuprofen (ADVIL) 600 MG tablet   metFORMIN (GLUCOPHAGE) 500 MG tablet   omeprazole (PRILOSEC OTC) 20 MG tablet   No current facility-administered medications for this encounter.    Myra Gianotti, PA-C Surgical Short Stay/Anesthesiology [facility] Phone [phone] [facility] Phone [phone] 05/03/2021 3:05 PM

## 2021-05-06 ENCOUNTER — Encounter (HOSPITAL_COMMUNITY): Payer: Self-pay | Admitting: General Surgery

## 2021-05-06 ENCOUNTER — Inpatient Hospital Stay: Payer: Medicaid Other | Admitting: Hematology and Oncology

## 2021-05-06 ENCOUNTER — Other Ambulatory Visit: Payer: Self-pay

## 2021-05-06 ENCOUNTER — Ambulatory Visit (HOSPITAL_COMMUNITY): Payer: Medicaid Other | Admitting: Anesthesiology

## 2021-05-06 ENCOUNTER — Encounter (HOSPITAL_COMMUNITY)
Admission: RE | Disposition: A | Discharge status: Home / Self Care | Payer: Self-pay | Source: Home / Self Care | Attending: General Surgery

## 2021-05-06 ENCOUNTER — Ambulatory Visit (HOSPITAL_COMMUNITY)
Admission: RE | Admit: 2021-05-06 | Discharge: 2021-05-07 | Disposition: A | Discharge status: Home / Self Care | Payer: Medicaid Other | Attending: General Surgery | Admitting: General Surgery

## 2021-05-06 ENCOUNTER — Ambulatory Visit (HOSPITAL_COMMUNITY): Payer: Medicaid Other | Admitting: Vascular Surgery

## 2021-05-06 DIAGNOSIS — Z170 Estrogen receptor positive status [ER+]: Secondary | ICD-10-CM | POA: Diagnosis not present

## 2021-05-06 DIAGNOSIS — C50412 Malignant neoplasm of upper-outer quadrant of left female breast: Secondary | ICD-10-CM | POA: Insufficient documentation

## 2021-05-06 DIAGNOSIS — N6032 Fibrosclerosis of left breast: Secondary | ICD-10-CM | POA: Diagnosis not present

## 2021-05-06 DIAGNOSIS — Z20822 Contact with and (suspected) exposure to covid-19: Secondary | ICD-10-CM | POA: Diagnosis not present

## 2021-05-06 DIAGNOSIS — Z923 Personal history of irradiation: Secondary | ICD-10-CM | POA: Diagnosis not present

## 2021-05-06 DIAGNOSIS — F17210 Nicotine dependence, cigarettes, uncomplicated: Secondary | ICD-10-CM | POA: Diagnosis not present

## 2021-05-06 DIAGNOSIS — E119 Type 2 diabetes mellitus without complications: Secondary | ICD-10-CM | POA: Insufficient documentation

## 2021-05-06 DIAGNOSIS — K219 Gastro-esophageal reflux disease without esophagitis: Secondary | ICD-10-CM | POA: Diagnosis not present

## 2021-05-06 DIAGNOSIS — J449 Chronic obstructive pulmonary disease, unspecified: Secondary | ICD-10-CM | POA: Insufficient documentation

## 2021-05-06 DIAGNOSIS — I10 Essential (primary) hypertension: Secondary | ICD-10-CM | POA: Diagnosis not present

## 2021-05-06 DIAGNOSIS — Z9221 Personal history of antineoplastic chemotherapy: Secondary | ICD-10-CM | POA: Diagnosis not present

## 2021-05-06 DIAGNOSIS — C50912 Malignant neoplasm of unspecified site of left female breast: Secondary | ICD-10-CM | POA: Diagnosis present

## 2021-05-06 DIAGNOSIS — N61 Mastitis without abscess: Secondary | ICD-10-CM | POA: Diagnosis not present

## 2021-05-06 HISTORY — PX: TOTAL MASTECTOMY: SHX6129

## 2021-05-06 LAB — GLUCOSE, CAPILLARY
Glucose-Capillary: 162 mg/dL — ABNORMAL HIGH (ref 70–99)
Glucose-Capillary: 116 mg/dL — ABNORMAL HIGH (ref 70–99)
Glucose-Capillary: 126 mg/dL — ABNORMAL HIGH (ref 70–99)

## 2021-05-06 LAB — CBC
HCT: 45.8 % (ref 36.0–46.0)
Hemoglobin: 16.3 g/dL — ABNORMAL HIGH (ref 12.0–15.0)
MCH: 33.1 pg (ref 26.0–34.0)
MCHC: 35.6 g/dL (ref 30.0–36.0)
MCV: 92.9 fL (ref 80.0–100.0)
Platelets: 242 10*3/uL (ref 150–400)
RBC: 4.93 MIL/uL (ref 3.87–5.11)
RDW: 12.3 % (ref 11.5–15.5)
WBC: 13 10*3/uL — ABNORMAL HIGH (ref 4.0–10.5)
nRBC: 0 % (ref 0.0–0.2)

## 2021-05-06 LAB — SARS CORONAVIRUS 2 BY RT PCR (HOSPITAL ORDER, PERFORMED IN ~~LOC~~ HOSPITAL LAB): SARS Coronavirus 2: NEGATIVE

## 2021-05-06 SURGERY — MASTECTOMY, SIMPLE
Anesthesia: General | Site: Breast | Laterality: Left

## 2021-05-06 MED ORDER — ONDANSETRON HCL 4 MG/2ML IJ SOLN: 4.0000 mg | Freq: Once | INTRAMUSCULAR | Status: DC | PRN

## 2021-05-06 MED ORDER — GABAPENTIN 100 MG PO CAPS
200.0000 mg | ORAL_CAPSULE | Freq: Three times a day (TID) | ORAL | Status: DC
  Administered 2021-05-07 (×2): 200 mg via ORAL
  Filled 2021-05-06 (×2): qty 2

## 2021-05-06 MED ORDER — PROPOFOL 10 MG/ML IV BOLUS
INTRAVENOUS | Status: DC | PRN
  Administered 2021-05-06: 150 mg via INTRAVENOUS

## 2021-05-06 MED ORDER — METFORMIN HCL 500 MG PO TABS
500.0000 mg | ORAL_TABLET | Freq: Every day | ORAL | Status: DC
  Administered 2021-05-07: 500 mg via ORAL
  Filled 2021-05-06: qty 1

## 2021-05-06 MED ORDER — CHLORHEXIDINE GLUCONATE CLOTH 2 % EX PADS: 6.0000 | MEDICATED_PAD | Freq: Once | CUTANEOUS | Status: DC

## 2021-05-06 MED ORDER — FENTANYL CITRATE (PF) 250 MCG/5ML IJ SOLN
INTRAMUSCULAR | Status: AC
  Filled 2021-05-06: qty 5

## 2021-05-06 MED ORDER — OXYCODONE HCL 5 MG PO TABS: 5.0000 mg | ORAL_TABLET | Freq: Once | ORAL | Status: DC | PRN

## 2021-05-06 MED ORDER — LIDOCAINE 2% (20 MG/ML) 5 ML SYRINGE
INTRAMUSCULAR | Status: AC
  Filled 2021-05-06: qty 5

## 2021-05-06 MED ORDER — ALBUTEROL SULFATE HFA 108 (90 BASE) MCG/ACT IN AERS: 2.0000 | INHALATION_SPRAY | Freq: Four times a day (QID) | RESPIRATORY_TRACT | Status: DC | PRN

## 2021-05-06 MED ORDER — 0.9 % SODIUM CHLORIDE (POUR BTL) OPTIME
TOPICAL | Status: DC | PRN
  Administered 2021-05-06: 1000 mL

## 2021-05-06 MED ORDER — ANASTROZOLE 1 MG PO TABS
1.0000 mg | ORAL_TABLET | Freq: Every day | ORAL | Status: DC
  Administered 2021-05-07: 1 mg via ORAL
  Filled 2021-05-06: qty 1

## 2021-05-06 MED ORDER — EPHEDRINE SULFATE-NACL 50-0.9 MG/10ML-% IV SOSY
PREFILLED_SYRINGE | INTRAVENOUS | Status: DC | PRN
  Administered 2021-05-06 (×2): 5 mg via INTRAVENOUS

## 2021-05-06 MED ORDER — MIDAZOLAM HCL 2 MG/2ML IJ SOLN
INTRAMUSCULAR | Status: AC
  Filled 2021-05-06: qty 2

## 2021-05-06 MED ORDER — GABAPENTIN 300 MG PO CAPS
300.0000 mg | ORAL_CAPSULE | ORAL | Status: AC
  Administered 2021-05-06: 300 mg via ORAL
  Filled 2021-05-06: qty 1

## 2021-05-06 MED ORDER — DEXAMETHASONE SODIUM PHOSPHATE 4 MG/ML IJ SOLN
INTRAMUSCULAR | Status: DC | PRN
  Administered 2021-05-06: 10 mg via INTRAVENOUS

## 2021-05-06 MED ORDER — EPHEDRINE 5 MG/ML INJ
INTRAVENOUS | Status: AC
  Filled 2021-05-06: qty 5

## 2021-05-06 MED ORDER — FENTANYL CITRATE (PF) 100 MCG/2ML IJ SOLN
INTRAMUSCULAR | Status: DC | PRN
Start: 2021-05-06 — End: 2021-05-06
  Administered 2021-05-06: 50 ug via INTRAVENOUS
  Administered 2021-05-06: 100 ug via INTRAVENOUS
  Administered 2021-05-06 (×2): 25 ug via INTRAVENOUS

## 2021-05-06 MED ORDER — FENTANYL CITRATE (PF) 100 MCG/2ML IJ SOLN
INTRAMUSCULAR | Status: AC
  Filled 2021-05-06: qty 2

## 2021-05-06 MED ORDER — HYDROCODONE-ACETAMINOPHEN 5-325 MG PO TABS
1.0000 | ORAL_TABLET | ORAL | Status: DC | PRN
  Administered 2021-05-06 – 2021-05-07 (×3): 2 via ORAL
  Filled 2021-05-06 (×3): qty 2

## 2021-05-06 MED ORDER — ONDANSETRON HCL 4 MG/2ML IJ SOLN
INTRAMUSCULAR | Status: AC
  Filled 2021-05-06: qty 2

## 2021-05-06 MED ORDER — PHENYLEPHRINE 40 MCG/ML (10ML) SYRINGE FOR IV PUSH (FOR BLOOD PRESSURE SUPPORT)
PREFILLED_SYRINGE | INTRAVENOUS | Status: AC
  Filled 2021-05-06: qty 10

## 2021-05-06 MED ORDER — LACTATED RINGERS IV SOLN
INTRAVENOUS | Status: DC
Start: 2021-05-06 — End: 2021-05-06

## 2021-05-06 MED ORDER — ALBUTEROL SULFATE (2.5 MG/3ML) 0.083% IN NEBU: 2.5000 mg | INHALATION_SOLUTION | Freq: Four times a day (QID) | RESPIRATORY_TRACT | Status: DC | PRN

## 2021-05-06 MED ORDER — DEXAMETHASONE SODIUM PHOSPHATE 10 MG/ML IJ SOLN
INTRAMUSCULAR | Status: AC
  Filled 2021-05-06: qty 1

## 2021-05-06 MED ORDER — ONDANSETRON HCL 4 MG/2ML IJ SOLN
INTRAMUSCULAR | Status: DC | PRN
  Administered 2021-05-06: 4 mg via INTRAVENOUS

## 2021-05-06 MED ORDER — CHLORHEXIDINE GLUCONATE 0.12 % MT SOLN
OROMUCOSAL | Status: AC
  Administered 2021-05-06: 15 mL
  Filled 2021-05-06: qty 15

## 2021-05-06 MED ORDER — PHENYLEPHRINE 40 MCG/ML (10ML) SYRINGE FOR IV PUSH (FOR BLOOD PRESSURE SUPPORT)
PREFILLED_SYRINGE | INTRAVENOUS | Status: DC | PRN
  Administered 2021-05-06: 80 ug via INTRAVENOUS
  Administered 2021-05-06: 40 ug via INTRAVENOUS
  Administered 2021-05-06 (×3): 80 ug via INTRAVENOUS
  Administered 2021-05-06 (×2): 40 ug via INTRAVENOUS

## 2021-05-06 MED ORDER — FENTANYL CITRATE (PF) 100 MCG/2ML IJ SOLN
25.0000 ug | INTRAMUSCULAR | Status: DC | PRN
  Administered 2021-05-06 (×3): 50 ug via INTRAVENOUS

## 2021-05-06 MED ORDER — ACETAMINOPHEN 325 MG PO TABS: 325.0000 mg | ORAL_TABLET | ORAL | Status: DC | PRN

## 2021-05-06 MED ORDER — MIDAZOLAM HCL 5 MG/5ML IJ SOLN
INTRAMUSCULAR | Status: DC | PRN
  Administered 2021-05-06: 2 mg via INTRAVENOUS

## 2021-05-06 MED ORDER — LIDOCAINE 2% (20 MG/ML) 5 ML SYRINGE
INTRAMUSCULAR | Status: DC | PRN
  Administered 2021-05-06: 100 mg via INTRAVENOUS

## 2021-05-06 MED ORDER — PROPOFOL 10 MG/ML IV BOLUS
INTRAVENOUS | Status: AC
  Filled 2021-05-06: qty 20

## 2021-05-06 MED ORDER — CEFAZOLIN SODIUM-DEXTROSE 2-4 GM/100ML-% IV SOLN
2.0000 g | INTRAVENOUS | Status: AC
  Administered 2021-05-06: 2 g via INTRAVENOUS
  Filled 2021-05-06: qty 100

## 2021-05-06 MED ORDER — ACETAMINOPHEN 160 MG/5ML PO SOLN: 325.0000 mg | ORAL | Status: DC | PRN

## 2021-05-06 MED ORDER — MEPERIDINE HCL 25 MG/ML IJ SOLN: 6.2500 mg | INTRAMUSCULAR | Status: DC | PRN

## 2021-05-06 MED ORDER — HYDROCODONE-ACETAMINOPHEN 5-325 MG PO TABS
ORAL_TABLET | ORAL | Status: AC
  Administered 2021-05-07: 2
  Filled 2021-05-06: qty 2

## 2021-05-06 MED ORDER — OXYCODONE HCL 5 MG/5ML PO SOLN: 5.0000 mg | Freq: Once | ORAL | Status: DC | PRN

## 2021-05-06 SURGICAL SUPPLY — 46 items
ADH SKN CLS APL DERMABOND .7 (GAUZE/BANDAGES/DRESSINGS) ×1
APL PRP STRL LF DISP 70% ISPRP (MISCELLANEOUS) ×1
APPLIER CLIP 9.375 MED OPEN (MISCELLANEOUS) ×2
APR CLP MED 9.3 20 MLT OPN (MISCELLANEOUS) ×1
BAG COUNTER SPONGE SURGICOUNT (BAG) ×3 IMPLANT
BAG SPNG CNTER NS LX DISP (BAG) ×1
BINDER BREAST LRG (GAUZE/BANDAGES/DRESSINGS) IMPLANT
BINDER BREAST XLRG (GAUZE/BANDAGES/DRESSINGS) ×1 IMPLANT
BIOPATCH RED 1 DISK 7.0 (GAUZE/BANDAGES/DRESSINGS) ×5 IMPLANT
CANISTER SUCT 3000ML PPV (MISCELLANEOUS) ×3 IMPLANT
CHLORAPREP W/TINT 26 (MISCELLANEOUS) ×3 IMPLANT
CLIP APPLIE 9.375 MED OPEN (MISCELLANEOUS) ×2 IMPLANT
COVER SURGICAL LIGHT HANDLE (MISCELLANEOUS) ×3 IMPLANT
DERMABOND ADVANCED (GAUZE/BANDAGES/DRESSINGS) ×1
DERMABOND ADVANCED .7 DNX12 (GAUZE/BANDAGES/DRESSINGS) ×2 IMPLANT
DEVICE DSSCT PLSMBLD 3.0S LGHT (MISCELLANEOUS) ×2 IMPLANT
DRAIN CHANNEL 19F RND (DRAIN) ×3 IMPLANT
DRAPE LAPAROSCOPIC ABDOMINAL (DRAPES) ×3 IMPLANT
DRSG PAD ABDOMINAL 8X10 ST (GAUZE/BANDAGES/DRESSINGS) ×6 IMPLANT
DRSG TEGADERM 2-3/8X2-3/4 SM (GAUZE/BANDAGES/DRESSINGS) ×1 IMPLANT
ELECT REM PT RETURN 9FT ADLT (ELECTROSURGICAL) ×2
ELECTRODE REM PT RTRN 9FT ADLT (ELECTROSURGICAL) ×2 IMPLANT
EVACUATOR SILICONE 100CC (DRAIN) ×3 IMPLANT
GAUZE SPONGE 4X4 12PLY STRL (GAUZE/BANDAGES/DRESSINGS) ×3 IMPLANT
GLOVE SURG ENC MOIS LTX SZ7.5 (GLOVE) ×3 IMPLANT
GOWN STRL REUS W/ TWL LRG LVL3 (GOWN DISPOSABLE) ×4 IMPLANT
GOWN STRL REUS W/TWL LRG LVL3 (GOWN DISPOSABLE) ×4
KIT BASIN OR (CUSTOM PROCEDURE TRAY) ×3 IMPLANT
KIT TURNOVER KIT B (KITS) ×3 IMPLANT
LIGHT WAVEGUIDE WIDE FLAT (MISCELLANEOUS) IMPLANT
NS IRRIG 1000ML POUR BTL (IV SOLUTION) ×3 IMPLANT
PACK GENERAL/GYN (CUSTOM PROCEDURE TRAY) ×3 IMPLANT
PAD ABD 8X10 STRL (GAUZE/BANDAGES/DRESSINGS) ×1 IMPLANT
PAD ARMBOARD 7.5X6 YLW CONV (MISCELLANEOUS) ×3 IMPLANT
PENCIL SMOKE EVACUATOR (MISCELLANEOUS) IMPLANT
PLASMABLADE 3.0S W/LIGHT (MISCELLANEOUS) ×2
SPECIMEN JAR X LARGE (MISCELLANEOUS) ×3 IMPLANT
SUT ETHILON 3 0 FSL (SUTURE) ×3 IMPLANT
SUT MNCRL AB 4-0 PS2 18 (SUTURE) ×1 IMPLANT
SUT MON AB 4-0 PC3 18 (SUTURE) ×3 IMPLANT
SUT VIC AB 3-0 54X BRD REEL (SUTURE) IMPLANT
SUT VIC AB 3-0 BRD 54 (SUTURE)
SUT VIC AB 3-0 SH 18 (SUTURE) ×4 IMPLANT
TOWEL GREEN STERILE (TOWEL DISPOSABLE) ×3 IMPLANT
TOWEL GREEN STERILE FF (TOWEL DISPOSABLE) ×3 IMPLANT
TUBE CONNECTING 12X1/4 (SUCTIONS) ×3 IMPLANT

## 2021-05-06 NOTE — Transfer of Care (Signed)
Immediate Anesthesia Transfer of Care Note  Patient: Julia [name]  Procedure(s) Performed: LEFT TOTAL MASTECTOMY (Left: Breast)  Patient Location: PACU  Anesthesia Type:General  Level of Consciousness: awake and alert   Airway & Oxygen Therapy: Patient Spontanous Breathing and Patient connected to face mask oxygen  Post-op Assessment: Report given to RN and Post -op Vital signs reviewed and stable  Post vital signs: Reviewed and stable  Last Vitals:  Vitals Value Taken Time  BP 130/67 05/06/21 1546  Temp 36.7 C 05/06/21 1545  Pulse 79 05/06/21 1554  Resp 17 05/06/21 1554  SpO2 99 % 05/06/21 1554  Vitals shown include unvalidated device data.  Last Pain:  Vitals:   05/06/21 1152  TempSrc:   PainSc: 0-No pain         Complications: No notable events documented.

## 2021-05-06 NOTE — H&P (Signed)
PROVIDER: Landry Corporal, MD  MRN: [id number] DOB: 06-28-1965 Subjective   Chief Complaint: Long Term Follow Up (Breast )   History of Present Illness: Julia [name] is a 56 y.o. female who is seen today for left breast cancer. The patient is a 56 year old white female who is about 1-1/2 years status post left breast lumpectomy and sentinel node biopsy for a T2N0 left breast cancer that was ER and PR positive and HER2 negative. Her Oncotype score was high risk so she was also treated with chemotherapy in addition to radiation. She has developed significant pain and swelling in the left breast. Her recent MRI showed diffuse inflammation consistent with radiation fibrosis. At this point she would like to have the breast removed  Review of Systems: A complete review of systems was obtained from the patient. I have reviewed this information and discussed as appropriate with the patient. See HPI as well for other ROS.  ROS   Medical History: Past Medical History:  Diagnosis Date   Arthritis   Asthma, unspecified asthma severity, unspecified whether complicated, unspecified whether persistent   Diabetes mellitus without complication (CMS-HCC)   Hyperlipidemia   Hypertension   Patient Active Problem List  Diagnosis   Body mass index (BMI) 26.0-26.9, adult   Carpal tunnel syndrome   Cough   DDD (degenerative disc disease), lumbosacral   Diabetes mellitus without complication (CMS-HCC)   Advance care planning   Encounter for chronic pain management   Routine general medical examination at a health care facility   Essential (primary) hypertension   Hyperlipidemia, mixed   Malignant neoplasm of upper-outer quadrant of left breast in female, estrogen receptor positive (CMS-HCC)   Migraine headache   Osteoarthritis of facet joint of lumbar spine   Osteoporosis   Plantar fasciitis   Port-A-Cath in place   Radiculopathy, lumbar region   Rash   Skin lesion   Piriformis syndrome    Sciatica, left side   Shingles   Syncope   Tobacco abuse   VIN III (vulvar intraepithelial neoplasia III)   Past Surgical History:  Procedure Laterality Date   Breast Surgery  Left Biopsy08/2022, Left Lumpectomy 10/2019   CHOLECYSTECTOMY   Port a Cath Right 11/2019  Placement   TONSILLECTOMY   VULVECTOMY    Allergies  Allergen Reactions   Propoxyphene Hives   Propoxyphene N-Acetaminophen Hives   Nabumetone Rash  She can tolerate ibuprofen w/o difficulty   Rofecoxib Rash  Nightmares (vioxx)   Current Outpatient Medications on File Prior to Visit  Medication Sig Dispense Refill   albuterol (PROVENTIL) 2.5 mg /3 mL (0.083 %) nebulizer solution Inhale 3 mLs (2.5 mg total) into the lungs every 6 (six) hours as needed   albuterol 90 mcg/actuation inhaler Inhale into the lungs   gabapentin (NEURONTIN) 300 MG capsule Take up to 4 tabs a day (2 tabs twice a day)   metFORMIN (GLUCOPHAGE) 500 MG tablet Take by mouth every 12 (twelve) hours   omeprazole (PRILOSEC) 20 MG DR capsule Take 1 capsule (20 mg total) by mouth every other day   No current facility-administered medications on file prior to visit.   History reviewed. No pertinent family history.   Social History   Tobacco Use  Smoking Status Every Day   Types: Cigarettes  Smokeless Tobacco Never    Social History   Socioeconomic History   Marital status: Married  Tobacco Use   Smoking status: Every Day  Types: Cigarettes   Smokeless tobacco: Never  Vaping  Use   Vaping Use: Never used  Substance and Sexual Activity   Alcohol use: Not Currently   Drug use: Never   Objective:   Vitals:  Weight: 66.6 kg (146 lb 12.8 oz)  Height: 160 cm ('5\' 3"' )   Body mass index is 26 kg/m.  Physical Exam Vitals reviewed.  Constitutional:  General: She is not in acute distress. Appearance: Normal appearance.  HENT:  Head: Normocephalic and atraumatic.  Right Ear: External ear normal.  Left Ear: External ear normal.   Nose: Nose normal.  Mouth/Throat:  Mouth: Mucous membranes are moist.  Pharynx: Oropharynx is clear.  Eyes:  General: No scleral icterus. Extraocular Movements: Extraocular movements intact.  Conjunctiva/sclera: Conjunctivae normal.  Pupils: Pupils are equal, round, and reactive to light.  Cardiovascular:  Rate and Rhythm: Normal rate and regular rhythm.  Pulses: Normal pulses.  Heart sounds: Normal heart sounds.  Pulmonary:  Effort: Pulmonary effort is normal. No respiratory distress.  Breath sounds: Normal breath sounds.  Abdominal:  General: Bowel sounds are normal.  Palpations: Abdomen is soft.  Tenderness: There is no abdominal tenderness.  Musculoskeletal:  General: No swelling, tenderness or deformity. Normal range of motion.  Cervical back: Normal range of motion and neck supple.  Skin: General: Skin is warm and dry.  Coloration: Skin is not jaundiced.  Neurological:  General: No focal deficit present.  Mental Status: She is alert and oriented to person, place, and time.  Psychiatric:  Mood and Affect: Mood normal.  Behavior: Behavior normal.     Breast: There is diffuse thickening of the left breast with some redness. There is significant tenderness associated with it. There is no palpable axillary, supraclavicular, or cervical lymphadenopathy. There is no palpable mass in the right breast.  Labs, Imaging and Diagnostic Testing:  Assessment and Plan:  Diagnoses and all orders for this visit:  Malignant neoplasm of upper-outer quadrant of left breast in female, estrogen receptor positive (CMS-HCC)    The patient is about 1-1/2 years status post left breast lumpectomy for breast cancer. She continues to have significant pain and swelling in the left breast. At this point she has considered the options and would like to have the left breast removed. She is not interested in reconstruction. I have discussed with her in detail the risks and benefits of the  operation as well as some of the technical aspects and she understands and wishes to proceed.

## 2021-05-06 NOTE — Anesthesia Procedure Notes (Signed)
Procedure Name: LMA Insertion Date/Time: 05/06/2021 2:15 PM Performed by: Michele Rockers, CRNA Pre-anesthesia Checklist: Patient identified, Patient being monitored, Timeout performed, Emergency Drugs available and Suction available Patient Re-evaluated:Patient Re-evaluated prior to induction Oxygen Delivery Method: Circle System Utilized Preoxygenation: Pre-oxygenation with 100% oxygen Induction Type: IV induction Ventilation: Mask ventilation without difficulty LMA: LMA inserted LMA Size: 4.0 Number of attempts: 1 Placement Confirmation: positive ETCO2 and breath sounds checked- equal and bilateral Tube secured with: Tape Dental Injury: Teeth and Oropharynx as per pre-operative assessment

## 2021-05-06 NOTE — Op Note (Signed)
05/06/2021  3:34 PM  PATIENT:  Julia [name]  56 y.o. female  PRE-OPERATIVE DIAGNOSIS:  LEFT BREAST FIBROSIS  POST-OPERATIVE DIAGNOSIS:  LEFT BREAST FIBROSIS  PROCEDURE:  Procedure(s): LEFT TOTAL MASTECTOMY (Left)  SURGEON:  Surgeon(s) and Role:    Jovita Kussmaul, MD - Primary  PHYSICIAN ASSISTANT:   ASSISTANTS: Pryor Curia, RNFA   ANESTHESIA:   general  EBL:  10cc   BLOOD ADMINISTERED:none  DRAINS: (1) Blake drain(s) in the prepectoral space    LOCAL MEDICATIONS USED:  NONE  SPECIMEN:  Source of Specimen:  left mastectomy  DISPOSITION OF SPECIMEN:  PATHOLOGY  COUNTS:  YES  TOURNIQUET:  * No tourniquets in log *  DICTATION: .Dragon Dictation  After informed consent was obtained the patient was brought to the operating room and placed in the supine position on the operating table.  After adequate induction of general anesthesia the patient's left chest, breast, and axillary area were prepped with ChloraPrep, allowed to dry, and draped in usual sterile manner.  An appropriate timeout was performed.  The patient had a very red tender fibrotic left breast after radiation that did not respond to antibiotic therapy.  She presents now for mastectomy.  An elliptical incision was made in the skin around the nipple and areola complex in a manner to try to leave adequate skin for coverage of the chest wall.  The incision was carried through the skin and subcutaneous tissue sharply with the PlasmaBlade.  Breast hooks were used to elevate the skin flaps anteriorly towards the ceiling.  Thick skin flaps were then created by dissecting between the breast tissue and the subcutaneous fat and skin.  Given the inflamed nature of the skin I tried to make the skin flaps a little bit thicker to allow them to be viable.  This dissection was carried all the way to the chest wall circumferentially.  Next the breast was removed from the pectoralis muscle with the pectoralis fashion.  Once this  was accomplished the entire left breast was removed from the patient.  It was marked with a stitch on the lateral skin and sent to pathology for further evaluation.  Hemostasis was achieved using the PlasmaBlade.  The wound was irrigated with copious amounts of saline.  A small stab incision was made near the anterior axillary line inferior to the operative bed.  The tonsil clamp was placed through this opening and used to bring a 19 [place] round Blake drain into the operative bed.  The drain was curled along the chest wall.  The drain was anchored to the skin with a 3-0 nylon stitch.  Next the superior and inferior skin flaps were grossly reapproximated with interrupted 3-0 Vicryl stitches.  The skin was then closed with a running 4-0 Monocryl subcuticular stitch.  Dermabond dressings were applied.  The patient tolerated the procedure well.  At the end of the case all needle sponge and instrument counts were correct.  The remaining skin was still very inflamed and thick.  The drain was placed to bulb suction and there was a good seal.  The patient was then awakened and taken to recovery in stable condition.  PLAN OF CARE: Admit for overnight observation  PATIENT DISPOSITION:  PACU - hemodynamically stable.   Delay start of Pharmacological VTE agent (>24hrs) due to surgical blood loss or risk of bleeding: no

## 2021-05-06 NOTE — Anesthesia Postprocedure Evaluation (Signed)
Anesthesia Post Note  Patient: Julia [name]  Procedure(s) Performed: LEFT TOTAL MASTECTOMY (Left: Breast)     Patient location during evaluation: PACU Anesthesia Type: General Level of consciousness: awake and alert Pain management: pain level controlled Vital Signs Assessment: post-procedure vital signs reviewed and stable Respiratory status: spontaneous breathing, nonlabored ventilation, respiratory function stable and patient connected to nasal cannula oxygen Cardiovascular status: blood pressure returned to baseline and stable Postop Assessment: no apparent nausea or vomiting Anesthetic complications: no   No notable events documented.  Last Vitals:  Vitals:   05/06/21 1900 05/06/21 1930  BP: 109/64 116/64  Pulse: 100 86  Resp: 20 14  Temp:  36.7 C  SpO2: 97% 96%    Last Pain:  Vitals:   05/06/21 1900  TempSrc:   PainSc: 4                  Nya Monds

## 2021-05-06 NOTE — Progress Notes (Signed)
Patient refused PIV start, RN made aware.

## 2021-05-07 ENCOUNTER — Telehealth: Payer: Self-pay | Admitting: Hematology and Oncology

## 2021-05-07 ENCOUNTER — Encounter (HOSPITAL_COMMUNITY): Payer: Self-pay | Admitting: General Surgery

## 2021-05-07 DIAGNOSIS — N6032 Fibrosclerosis of left breast: Secondary | ICD-10-CM | POA: Diagnosis not present

## 2021-05-07 MED ORDER — HYDROCODONE-ACETAMINOPHEN 5-325 MG PO TABS: 1.0000 | ORAL_TABLET | Freq: Four times a day (QID) | ORAL | 0 refills | Status: DC | PRN

## 2021-05-07 MED ORDER — ONDANSETRON 4 MG PO TBDP: 4.0000 mg | ORAL_TABLET | Freq: Four times a day (QID) | ORAL | Status: DC | PRN

## 2021-05-07 MED ORDER — MORPHINE SULFATE (PF) 2 MG/ML IV SOLN: 1.0000 mg | INTRAVENOUS | Status: DC | PRN

## 2021-05-07 MED ORDER — GABAPENTIN 100 MG PO CAPS: 200.0000 mg | ORAL_CAPSULE | Freq: Three times a day (TID) | ORAL | 10 refills | Status: DC

## 2021-05-07 MED ORDER — PANTOPRAZOLE SODIUM 40 MG IV SOLR: 40.0000 mg | Freq: Every day | INTRAVENOUS | Status: DC

## 2021-05-07 MED ORDER — METHOCARBAMOL 500 MG PO TABS: 500.0000 mg | ORAL_TABLET | Freq: Four times a day (QID) | ORAL | Status: DC | PRN

## 2021-05-07 MED ORDER — HEPARIN SODIUM (PORCINE) 5000 UNIT/ML IJ SOLN
5000.0000 [IU] | Freq: Three times a day (TID) | INTRAMUSCULAR | Status: DC
  Administered 2021-05-07: 5000 [IU] via SUBCUTANEOUS
  Filled 2021-05-07: qty 1

## 2021-05-07 MED ORDER — ONDANSETRON HCL 4 MG/2ML IJ SOLN: 4.0000 mg | Freq: Four times a day (QID) | INTRAMUSCULAR | Status: DC | PRN

## 2021-05-07 MED ORDER — SODIUM CHLORIDE 0.9 % IV SOLN: INTRAVENOUS | Status: DC

## 2021-05-07 NOTE — Progress Notes (Signed)
Pt discharged to home via wheelchair per volunteers with all belongings.

## 2021-05-07 NOTE — Plan of Care (Signed)
Pt admitted post left breast masectomy. Pt has JP drain. A&OX4 c/o pain from surgery. No further questions by the patient 2/4 rails up, call bell nearby and bed in lowest position no further needs voiced at this time. Julia [name] 05/07/21 3:38 AM

## 2021-05-07 NOTE — Progress Notes (Signed)
Pt arrived post op for left breast masectomy. Pt A&Ox4  PO pain meds given with some relief. Pt in bed with bed in lowest position, 2/4 rails up and the least restrictive option s were though of.Marland KitchenMarland Kitchen

## 2021-05-07 NOTE — Telephone Encounter (Signed)
Sch per 1/27 inb, pt ware

## 2021-05-07 NOTE — Discharge Summary (Signed)
Physician Discharge Summary  Patient ID: Julia [name] MRN: [id number] DOB/AGE: 08/31/1965 56 y.o.  Admit date: 05/06/2021 Discharge date: 05/07/2021  Admission Diagnoses:  Discharge Diagnoses:  Principal Problem:   Cancer of left female breast [facility])   Discharged Condition: good  Hospital Course: the pt underwent left mastectomy. She tolerated surgery well. On pod 1 she was ready for d/c home  Consults: None  Significant Diagnostic Studies: none  Treatments: surgery: as above  Discharge Exam: Blood pressure 121/66, pulse 80, temperature 98.7 F (37.1 C), temperature source Oral, resp. rate 18, height 5\' 4"  (1.626 m), weight 61.2 kg, SpO2 93 %. Chest wall: skin flaps viable  Disposition: Discharge disposition: 01-Home or Self Care       Discharge Instructions     Call MD for:  difficulty breathing, headache or visual disturbances   Complete by: As directed    Call MD for:  extreme fatigue   Complete by: As directed    Call MD for:  hives   Complete by: As directed    Call MD for:  persistant dizziness or light-headedness   Complete by: As directed    Call MD for:  persistant nausea and vomiting   Complete by: As directed    Call MD for:  redness, tenderness, or signs of infection (pain, swelling, redness, odor or green/yellow discharge around incision site)   Complete by: As directed    Call MD for:  severe uncontrolled pain   Complete by: As directed    Call MD for:  temperature >100.4   Complete by: As directed    Diet - low sodium heart healthy   Complete by: As directed    Discharge instructions   Complete by: As directed    Sponge bathe for first 4-5 days then may shower but have water hit on right side. No overhead activity with left arm. Empty drain, record output, recharge bulb daily   Increase activity slowly   Complete by: As directed    No wound care   Complete by: As directed         Follow-up Information     Autumn Messing III, MD Follow up  in 2 week(s).   Specialty: General Surgery Contact information: [place] [address] [phone]                 Signed: Autumn Messing III 05/07/2021, 9:06 AM

## 2021-05-07 NOTE — Progress Notes (Signed)
1 Day Post-Op   Subjective/Chief Complaint: No complaints other than some soreness   Objective: Vital signs in last 24 hours: Temp:  [97.8 F (36.6 C)-98.7 F (37.1 C)] 98.7 F (37.1 C) (01/31 0740) Pulse Rate:  [73-108] 80 (01/31 0740) Resp:  [11-20] 18 (01/31 0740) BP: (104-133)/(53-84) 121/66 (01/31 0740) SpO2:  [93 %-100 %] 93 % (01/31 0740) Weight:  [61.2 kg] 61.2 kg (01/30 1042)    Intake/Output from previous day: 01/30 0701 - 01/31 0700 In: 1260 [P.O.:360; I.V.:800; IV Piggyback:100] Out: 55 [Drains:30; Blood:25] Intake/Output this shift: Total I/O In: 240 [P.O.:240] Out: -   General appearance: alert and cooperative Resp: clear to auscultation bilaterally Chest wall: skin flaps viable. Look better than yesterday Cardio: regular rate and rhythm GI: soft, non-tender; bowel sounds normal; no masses,  no organomegaly  Lab Results:  Recent Labs    05/06/21 1116  WBC 13.0*  HGB 16.3*  HCT 45.8  PLT 242   BMET No results for input(s): NA, K, CL, CO2, GLUCOSE, BUN, CREATININE, CALCIUM in the last 72 hours. PT/INR No results for input(s): LABPROT, INR in the last 72 hours. ABG No results for input(s): PHART, HCO3 in the last 72 hours.  Invalid input(s): PCO2, PO2  Studies/Results: No results found.  Anti-infectives: Anti-infectives (From admission, onward)    Start     Dose/Rate Route Frequency Ordered Stop   05/06/21 1030  ceFAZolin (ANCEF) IVPB 2g/100 mL premix        2 g 200 mL/hr over 30 Minutes Intravenous On call to O.R. 05/06/21 1027 05/06/21 1405       Assessment/Plan: s/p Procedure(s): LEFT TOTAL MASTECTOMY (Left) Advance diet Discharge  LOS: 0 days    Autumn Messing III 05/07/2021

## 2021-05-07 NOTE — Progress Notes (Signed)
Discharge instructions given to pt. Pt verbalized understanding of all teaching ?

## 2021-05-09 ENCOUNTER — Inpatient Hospital Stay: Payer: Medicaid Other | Admitting: Hematology and Oncology

## 2021-05-09 ENCOUNTER — Inpatient Hospital Stay: Payer: Medicaid Other

## 2021-05-09 LAB — SURGICAL PATHOLOGY

## 2021-05-12 ENCOUNTER — Other Ambulatory Visit: Payer: Self-pay | Admitting: Hematology and Oncology

## 2021-05-13 MED ORDER — HYDROCODONE-ACETAMINOPHEN 5-325 MG PO TABS
1.0000 | ORAL_TABLET | Freq: Four times a day (QID) | ORAL | 0 refills | Status: DC | PRN
Start: 2021-05-13 — End: 2021-05-14

## 2021-05-14 ENCOUNTER — Other Ambulatory Visit: Payer: Self-pay | Admitting: Hematology and Oncology

## 2021-05-14 MED ORDER — HYDROCODONE-ACETAMINOPHEN 5-325 MG PO TABS: 1.0000 | ORAL_TABLET | Freq: Four times a day (QID) | ORAL | 0 refills | Status: DC | PRN

## 2021-05-15 NOTE — Progress Notes (Signed)
Patient Care Team: Tonia Ghent, MD as PCP - General (Family Medicine) [name], RN as Registered Nurse [name], RN as Registered Nurse [name], RN as Oncology Nurse Navigator [name], RN as Oncology Nurse Navigator Kyung Rudd, MD as Consulting Physician (Radiation Oncology) Nicholas Lose, MD as Consulting Physician (Hematology and Oncology) Jovita Kussmaul, MD as Consulting Physician (General Surgery)  DIAGNOSIS:    ICD-10-CM   1. Malignant neoplasm of upper-outer quadrant of left breast in female, estrogen receptor positive ([place])  C50.412    Z17.0       SUMMARY OF ONCOLOGIC HISTORY: Oncology History  Malignant neoplasm of upper-outer quadrant of left breast in female, estrogen receptor positive ([place])  08/25/2019 Initial Diagnosis   Palpable left breast mass x1 month. Diagnostic mammogram and [place] on 08/18/19 showed a 2.8cm mass at the 2 o'clock position, and one lymph node with mild cortical thickening in the left axilla. Biopsy on 08/25/19 showed invasive mammary carcinoma in the breast, grade 3, HER-2 equivocal by IHC (2+) negative by FISH, ER+ >90%, PR+ >90%, left axilla negative   10/07/2019 Surgery   Left lumpectomy and sentinel lymph node biopsy [name]) [phone]): Grade 3 IDC with DCIS, 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   10/18/2019 Cancer Staging   Staging form: Breast, AJCC 8th Edition - Pathologic stage from 10/18/2019: Stage IB (pT2, pN0(sn), cM0, G3, ER+, PR+, HER2-)   10/21/2019 Oncotype testing   The Oncotype DX score was 32 predicting a risk of outside the breast recurrence over the next 9 years of 20% if the patient's only systemic therapy is tamoxifen for 5 years.    11/15/2019 - 03/27/2020 Chemotherapy   Adjuvant chemotherapy with dose dense Adriamycin and Cytoxan x4 (11/15/2019 - 12/27/2019) followed by Taxol weekly x12 (01/09/2020 - 03/27/20).   04/25/2020 - 05/25/2020 Radiation Therapy   The patient  initially received a dose of 42.56 Gy in 16 fractions to the breast using whole-breast tangent fields. This was delivered using a 3-D conformal technique. The pt received a boost delivering an additional 8 Gy in 4 fractions using a electron boost with 9mV electrons. The total dose was 50.56 Gy.   05/2020 -  Anti-estrogen oral therapy   Anastrozole   05/06/2021 Surgery   Left mastectomy: Benign (performed because of left breast fibrosis and pain)     CHIEF COMPLIANT: Follow-up of left breast cancer  INTERVAL HISTORY: Julia FORRYis a 56y.o. with above-mentioned history of left breast cancer treated with lumpectomy, adjuvant chemotherapy, radiation, and is currently on antiestrogen therapy with anastrozole. She presents to the clinic today for follow-up.  She had a mastectomy on the left breast and is here today to discuss the final pathology report.  She is healing and recovering very well from the mastectomy.  The pain is manageable.  ALLERGIES:  is allergic to propoxyphene n-acetaminophen, nabumetone, and rofecoxib.  MEDICATIONS:  Current Outpatient Medications  Medication Sig Dispense Refill   Accu-Chek Softclix Lancets lancets SMARTSIG:Topical     acetaminophen (TYLENOL) 500 MG tablet Take 500-1,000 mg by mouth every 6 (six) hours as needed (pain.).     albuterol (PROVENTIL) (2.5 MG/3ML) 0.083% nebulizer solution Take 3 mLs (2.5 mg total) by nebulization every 6 (six) hours as needed for wheezing or shortness of breath. 75 mL 1   albuterol (VENTOLIN HFA) 108 (90 Base) MCG/ACT inhaler Inhale 2 puffs into the lungs every 6 (six) hours as needed for wheezing  or shortness of breath. 18 g 3   anastrozole (ARIMIDEX) 1 MG tablet TAKE 1 TABLET BY MOUTH EVERY DAY 90 tablet 3   blood glucose meter kit and supplies KIT Dispense based on patient and insurance preference. Use up to four times daily as directed. (FOR ICD-9 250.00, 250.01). 1 each 0   gabapentin (NEURONTIN) 100 MG capsule Take 2  capsules (200 mg total) by mouth 3 (three) times daily. 90 capsule 10   gabapentin (NEURONTIN) 300 MG capsule Take up to 4 tabs a day (2 tabs twice a day) (Patient taking differently: Take 600 mg by mouth 2 (two) times daily as needed (pain.).) 120 capsule 3   glucose blood (ACCU-CHEK GUIDE) test strip USE TO TEST BLOOD SUGAR UP TO 4 TIMES A DAY AS DIRECTED 100 strip 3   HYDROcodone-acetaminophen (NORCO) 5-325 MG tablet Take 1-2 tablets by mouth every 6 (six) hours as needed for severe pain. Sedation caution 60 tablet 0   ibuprofen (ADVIL) 600 MG tablet Take 1 tablet (600 mg total) by mouth every 8 (eight) hours as needed (For pain.  Take with food.). 90 tablet 2   metFORMIN (GLUCOPHAGE) 500 MG tablet TAKE 1 TABLET BY MOUTH ONCE DAILY WITH MEAL 90 tablet 1   omeprazole (PRILOSEC OTC) 20 MG tablet Take 1 tablet (20 mg total) by mouth every other day. 45 tablet 1   No current facility-administered medications for this visit.    PHYSICAL EXAMINATION: ECOG PERFORMANCE STATUS: 1 - Symptomatic but completely ambulatory  Vitals:   05/16/21 1131  BP: (!) 116/59  Pulse: 72  Resp: 18  Temp: (!) 97.5 F (36.4 C)  SpO2: 96%   Filed Weights   05/16/21 1131  Weight: 145 lb 6.4 oz (66 kg)    BREAST: Postmastectomy scar appears to be doing well (exam performed in the presence of a chaperone)  LABORATORY DATA:  I have reviewed the data as listed CMP Latest Ref Rng & Units 05/16/2021 04/25/2021 11/05/2020  Glucose 70 - 99 mg/dL 153(H) 93 145(H)  BUN 6 - 20 mg/dL '11 8 8  ' Creatinine 0.44 - 1.00 mg/dL 0.60 0.71 0.66  Sodium 135 - 145 mmol/L 137 135 142  Potassium 3.5 - 5.1 mmol/L 3.9 4.1 3.8  Chloride 98 - 111 mmol/L 105 102 108  CO2 22 - 32 mmol/L '26 23 27  ' Calcium 8.9 - 10.3 mg/dL 9.3 9.3 9.5  Total Protein 6.5 - 8.1 g/dL 6.8 - 6.3(L)  Total Bilirubin 0.3 - 1.2 mg/dL 0.4 - 0.5  Alkaline Phos 38 - 126 U/L 68 - 95  AST 15 - 41 U/L 18 - 21  ALT 0 - 44 U/L 19 - 27    Lab Results  Component Value  Date   WBC 8.8 05/16/2021   HGB 14.4 05/16/2021   HCT 41.1 05/16/2021   MCV 92.6 05/16/2021   PLT 225 05/16/2021   NEUTROABS 6.0 05/16/2021    ASSESSMENT & PLAN:  Malignant neoplasm of upper-outer quadrant of left breast in female, estrogen receptor positive ([place]) 10/07/2019:Left lumpectomy and sentinel lymph node biopsy [name]): IDC with DCIS, 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  ER 90%, PR 90%, HER-2 negative by FISH Posterior margin positive but no further surgery done because it is muscle on the back. T2N0 stage Ib Oncotype DX score 32: 20% risk of distant recurrence in 9 years   Treatment plan:  1. Adjuvant chemotherapy with dose dense Adriamycin and Cytoxan x4 followed by Taxol weekly x12  completed 03/27/21 2. followed by radiation therapy started 04/26/20 3.  Followed by adjuvant antiestrogen therapy.  Started February 2022 ------------------------------------------------------------------------------------------------------------------------------------------------- Anastrozole toxicities: She is tolerating it extremely well. Osteoporosis: Prolia every 6 months beginning 8/1/202  Left breast pain: Left mastectomy 04/29/2021: Benign Breast cancer surveillance: Right breast mammogram due July 2023 Osteoporosis: Every 6 months Prolia injections Return to clinic in 1 year for follow-up  No orders of the defined types were placed in this encounter.  The patient has a good understanding of the overall plan. she agrees with it. she will call with any problems that may develop before the next visit here.  Total time spent: 30 mins including face to face time and time spent for planning, charting and coordination of care  Rulon Eisenmenger, MD, MPH 05/16/2021  I, Thana Ates, am acting as scribe for Dr. Nicholas Lose.  I have reviewed the above documentation for accuracy and completeness, and I agree with the above.

## 2021-05-16 ENCOUNTER — Inpatient Hospital Stay: Payer: Medicaid Other | Attending: Adult Health

## 2021-05-16 ENCOUNTER — Other Ambulatory Visit: Payer: Self-pay

## 2021-05-16 ENCOUNTER — Inpatient Hospital Stay (HOSPITAL_BASED_OUTPATIENT_CLINIC_OR_DEPARTMENT_OTHER): Payer: Medicaid Other | Admitting: Hematology and Oncology

## 2021-05-16 ENCOUNTER — Inpatient Hospital Stay: Payer: Medicaid Other

## 2021-05-16 DIAGNOSIS — Z9012 Acquired absence of left breast and nipple: Secondary | ICD-10-CM | POA: Diagnosis not present

## 2021-05-16 DIAGNOSIS — Z923 Personal history of irradiation: Secondary | ICD-10-CM | POA: Diagnosis not present

## 2021-05-16 DIAGNOSIS — C50412 Malignant neoplasm of upper-outer quadrant of left female breast: Secondary | ICD-10-CM | POA: Insufficient documentation

## 2021-05-16 DIAGNOSIS — Z79899 Other long term (current) drug therapy: Secondary | ICD-10-CM | POA: Insufficient documentation

## 2021-05-16 DIAGNOSIS — M810 Age-related osteoporosis without current pathological fracture: Secondary | ICD-10-CM | POA: Insufficient documentation

## 2021-05-16 DIAGNOSIS — Z9221 Personal history of antineoplastic chemotherapy: Secondary | ICD-10-CM | POA: Diagnosis not present

## 2021-05-16 DIAGNOSIS — Z95828 Presence of other vascular implants and grafts: Secondary | ICD-10-CM

## 2021-05-16 DIAGNOSIS — Z170 Estrogen receptor positive status [ER+]: Secondary | ICD-10-CM

## 2021-05-16 DIAGNOSIS — Z7984 Long term (current) use of oral hypoglycemic drugs: Secondary | ICD-10-CM | POA: Diagnosis not present

## 2021-05-16 DIAGNOSIS — Z79811 Long term (current) use of aromatase inhibitors: Secondary | ICD-10-CM | POA: Diagnosis not present

## 2021-05-16 DIAGNOSIS — M816 Localized osteoporosis [Lequesne]: Secondary | ICD-10-CM

## 2021-05-16 LAB — CBC WITH DIFFERENTIAL (CANCER CENTER ONLY)
Abs Immature Granulocytes: 0.03 10*3/uL (ref 0.00–0.07)
Basophils Absolute: 0 10*3/uL (ref 0.0–0.1)
Basophils Relative: 1 %
Eosinophils Absolute: 0.5 10*3/uL (ref 0.0–0.5)
Eosinophils Relative: 5 %
HCT: 41.1 % (ref 36.0–46.0)
Hemoglobin: 14.4 g/dL (ref 12.0–15.0)
Immature Granulocytes: 0 %
Lymphocytes Relative: 20 %
Lymphs Abs: 1.7 10*3/uL (ref 0.7–4.0)
MCH: 32.4 pg (ref 26.0–34.0)
MCHC: 35 g/dL (ref 30.0–36.0)
MCV: 92.6 fL (ref 80.0–100.0)
Monocytes Absolute: 0.6 10*3/uL (ref 0.1–1.0)
Monocytes Relative: 7 %
Neutro Abs: 6 10*3/uL (ref 1.7–7.7)
Neutrophils Relative %: 67 %
Platelet Count: 225 10*3/uL (ref 150–400)
RBC: 4.44 MIL/uL (ref 3.87–5.11)
RDW: 12.5 % (ref 11.5–15.5)
WBC Count: 8.8 10*3/uL (ref 4.0–10.5)
nRBC: 0 % (ref 0.0–0.2)

## 2021-05-16 LAB — CMP (CANCER CENTER ONLY)
ALT: 19 U/L (ref 0–44)
AST: 18 U/L (ref 15–41)
Albumin: 3.9 g/dL (ref 3.5–5.0)
Alkaline Phosphatase: 68 U/L (ref 38–126)
Anion gap: 6 (ref 5–15)
BUN: 11 mg/dL (ref 6–20)
CO2: 26 mmol/L (ref 22–32)
Calcium: 9.3 mg/dL (ref 8.9–10.3)
Chloride: 105 mmol/L (ref 98–111)
Creatinine: 0.6 mg/dL (ref 0.44–1.00)
GFR, Estimated: >60 mL/min (ref >60)
Glucose, Bld: 153 mg/dL — ABNORMAL HIGH (ref 70–99)
Potassium: 3.9 mmol/L (ref 3.5–5.1)
Sodium: 137 mmol/L (ref 135–145)
Total Bilirubin: 0.4 mg/dL (ref 0.3–1.2)
Total Protein: 6.8 g/dL (ref 6.5–8.1)

## 2021-05-16 MED ORDER — DENOSUMAB 60 MG/ML ~~LOC~~ SOSY
60.0000 mg | PREFILLED_SYRINGE | Freq: Once | SUBCUTANEOUS | Status: AC
  Administered 2021-05-16: 60 mg via SUBCUTANEOUS
  Filled 2021-05-16: qty 1

## 2021-05-16 NOTE — Assessment & Plan Note (Signed)
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 T2N0 stage Ib Oncotype DX score 32: 20% risk of distant recurrence in 9 years  Treatment plan:  1. Adjuvant chemotherapy with dose dense Adriamycin and Cytoxan x4 followed by Taxol weekly x12completed 03/27/21 2. followed by radiation therapystarted 04/26/20 3. Followed by adjuvant antiestrogen therapy.  Started February 2022 ------------------------------------------------------------------------------------------------------------------------------------------------- Anastrozole toxicities: She is tolerating it extremely well. Osteoporosis: Prolia every 6 months beginning 8/1/202  Left breast pain: Left mastectomy 04/29/2021: Benign Breast cancer surveillance: Right breast mammogram due July 2023 Return to clinic in 1 year for follow-up

## 2021-05-17 ENCOUNTER — Telehealth: Payer: Self-pay | Admitting: Hematology and Oncology

## 2021-05-17 NOTE — Telephone Encounter (Signed)
Scheduled follow-up appointments per 2/9 los. Patient is aware. 

## 2021-05-23 ENCOUNTER — Other Ambulatory Visit: Payer: Self-pay | Admitting: Hematology and Oncology

## 2021-05-24 MED ORDER — HYDROCODONE-ACETAMINOPHEN 5-325 MG PO TABS: 1.0000 | ORAL_TABLET | Freq: Four times a day (QID) | ORAL | 0 refills | Status: DC | PRN

## 2021-06-06 ENCOUNTER — Ambulatory Visit: Payer: Medicaid Other | Admitting: Rehabilitation

## 2021-06-08 ENCOUNTER — Other Ambulatory Visit: Payer: Self-pay | Admitting: Family Medicine

## 2021-06-08 ENCOUNTER — Other Ambulatory Visit: Payer: Self-pay | Admitting: Hematology and Oncology

## 2021-06-10 ENCOUNTER — Other Ambulatory Visit: Payer: Self-pay | Admitting: Hematology and Oncology

## 2021-06-10 DIAGNOSIS — C50912 Malignant neoplasm of unspecified site of left female breast: Secondary | ICD-10-CM | POA: Diagnosis not present

## 2021-06-10 NOTE — Telephone Encounter (Signed)
Dr. Lindi Adie can you please refuse this, it was last filled 2 weeks ago and is too soon for a refill.  ?

## 2021-06-11 ENCOUNTER — Telehealth: Payer: Self-pay | Admitting: Family Medicine

## 2021-06-11 ENCOUNTER — Other Ambulatory Visit: Payer: Self-pay | Admitting: Hematology and Oncology

## 2021-06-11 MED ORDER — HYDROCODONE-ACETAMINOPHEN 5-325 MG PO TABS: 1.0000 | ORAL_TABLET | Freq: Four times a day (QID) | ORAL | 0 refills | Status: DC | PRN

## 2021-06-11 NOTE — Telephone Encounter (Signed)
Dr. Lindi Adie can you please refuse this request.  You have already re-filled it today but the pharmacy sent 3 requests and it will not allow me to refuse it since it is a controlled substance.  ?

## 2021-06-11 NOTE — Telephone Encounter (Signed)
Pt needs refill on HYDROcodone-acetaminophen (NORCO) 5-325 MG tablet sent to [facility] in [place] ?

## 2021-06-11 NOTE — Telephone Encounter (Signed)
Medication has already been refilled by Dr. Lindi Adie today. ?

## 2021-06-12 DIAGNOSIS — C50912 Malignant neoplasm of unspecified site of left female breast: Secondary | ICD-10-CM | POA: Diagnosis not present

## 2021-06-25 ENCOUNTER — Encounter: Payer: Self-pay | Admitting: Physical Therapy

## 2021-06-25 ENCOUNTER — Other Ambulatory Visit: Payer: Self-pay

## 2021-06-25 ENCOUNTER — Ambulatory Visit: Payer: Medicaid Other | Attending: General Surgery | Admitting: Physical Therapy

## 2021-06-25 DIAGNOSIS — M25612 Stiffness of left shoulder, not elsewhere classified: Secondary | ICD-10-CM | POA: Insufficient documentation

## 2021-06-25 DIAGNOSIS — M25512 Pain in left shoulder: Secondary | ICD-10-CM | POA: Diagnosis not present

## 2021-06-25 DIAGNOSIS — L599 Disorder of the skin and subcutaneous tissue related to radiation, unspecified: Secondary | ICD-10-CM | POA: Insufficient documentation

## 2021-06-25 DIAGNOSIS — Z483 Aftercare following surgery for neoplasm: Secondary | ICD-10-CM | POA: Insufficient documentation

## 2021-06-25 DIAGNOSIS — Z170 Estrogen receptor positive status [ER+]: Secondary | ICD-10-CM | POA: Diagnosis not present

## 2021-06-25 DIAGNOSIS — C50412 Malignant neoplasm of upper-outer quadrant of left female breast: Secondary | ICD-10-CM | POA: Diagnosis not present

## 2021-06-25 NOTE — Therapy (Signed)
?OUTPATIENT PHYSICAL THERAPY ONCOLOGY EVALUATION ? ?Patient Name: Julia [name] ?MRN: [id number] ?DOB:[birth year], 56 y.o., female ?Today's Date: 06/25/2021 ? ? PT End of Session - 06/25/21 0948   ? ? Visit Number 1   ? Number of Visits 2   ? Date for PT Re-Evaluation 07/23/21   ? PT Start Time [phone]   ? PT Stop Time [phone]   ? PT Time Calculation (min) 45 min   ? Activity Tolerance Patient tolerated treatment well   ? Behavior During Therapy [facility] for tasks assessed/performed   ? ?  ?  ? ?  ? ? ?Past Medical History:  ?Diagnosis Date  ? Acute bronchitis 04/19/2021  ? Anxiety   ? over cancer diagnosis  ? Asthma   ? bronchial asthma   ? Cancer ([place]) 09/2019  ? left breast invasive mammary cancer  ? Chronic back pain   ? COPD (chronic obstructive pulmonary disease) ([place])   ? smokes 1 1/2 ppd  ? Diabetes mellitus without complication ([place])   ? GERD (gastroesophageal reflux disease)   ? Personal history of chemotherapy   ? Personal history of radiation therapy   ? Pneumonia   ? hx of 2018   ? Smoking   ? ?Past Surgical History:  ?Procedure Laterality Date  ? BREAST BIOPSY Left 09/2019  ? x2  ? BREAST BIOPSY Left 11/22/2020  ? BREAST LUMPECTOMY Left 10/2019  ? BREAST LUMPECTOMY WITH SENTINEL LYMPH NODE BIOPSY Left 10/07/2019  ? Procedure: LEFT BREAST LUMPECTOMY WITH SENTINEL LYMPH NODE BX;  Surgeon: Jovita Kussmaul, MD;  Location: [facility];  Service: General;  Laterality: Left;  PEC BLOCK  ? CHOLECYSTECTOMY    ? PORTACATH PLACEMENT Right 11/10/2019  ? Procedure: INSERTION PORT-A-CATH;  Surgeon: Jovita Kussmaul, MD;  Location: [facility];  Service: General;  Laterality: Right;  ? TONSILLECTOMY    ? TOTAL MASTECTOMY Left 05/06/2021  ? Procedure: LEFT TOTAL MASTECTOMY;  Surgeon: Jovita Kussmaul, MD;  Location: [facility];  Service: General;  Laterality: Left;  ? VULVECTOMY N/A 01/30/2015  ? Procedure: WIDE LOCAL EXCISION VULVA;  Surgeon: Everitt Amber, MD;  Location: [facility];  Service: Gynecology;  Laterality: N/A;  ? ?Patient  Active Problem List  ? Diagnosis Date Noted  ? Cancer of left female breast ([place]) 05/06/2021  ? Surgical counseling visit 04/29/2021  ? Bronchitis 04/19/2021  ? Carpal tunnel syndrome 10/21/2020  ? Osteoporosis 10/19/2020  ? Diabetes mellitus without complication ([place]) 99/83/3825  ? Port-A-Cath in place 11/29/2019  ? Malignant neoplasm of upper-outer quadrant of left breast in female, estrogen receptor positive ([place]) 09/01/2019  ? Encounter for chronic pain management 07/21/2019  ? Essential (primary) hypertension 02/25/2019  ? Radiculopathy, lumbar region 02/25/2019  ? Migraine headache 02/04/2018  ? Osteoarthritis of facet joint of lumbar spine 08/18/2017  ? Sciatica, left side 07/18/2017  ? Syncope 04/14/2017  ? Cough 08/07/2016  ? Rash 07/09/2016  ? 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  ? Advance care planning 12/22/2014  ? Plantar fasciitis 10/12/2014  ? Shingles 11/14/2012  ? Routine general medical examination at a health care facility 05/07/2011  ? SKIN LESION 06/22/2008  ? TOBACCO ABUSE 01/27/2007  ? HYPERLIPIDEMIA, MIXED 12/14/2006  ? ? ?PCP: Tonia Ghent, MD ? ?REFERRING PROVIDER: Jovita Kussmaul, MD ? ?REFERRING DIAG: C50.412 (ICD-10-CM) - Malignant neoplasm of upper-outer quadrant of left female breast Z17.0 (ICD-10-CM) - Estrogen receptor positive status (ER+)  ? ?THERAPY DIAG:  ?Stiffness of left shoulder, not  elsewhere classified ? ?Acute pain of left shoulder ? ?Disorder of the skin and subcutaneous tissue related to radiation, unspecified ? ?Aftercare following surgery for neoplasm ? ?Malignant neoplasm of upper-outer quadrant of left breast in female, estrogen receptor positive ([place]) ? ?ONSET DATE: 05/06/21 ? ?SUBJECTIVE                                                                                                                                                                                          ? ?SUBJECTIVE STATEMENT: Dr. [name] wanted me to come and see you before I  go back to work. 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  ? ?PERTINENT HISTORY: 05/06/21- L mastectomy due to left breast fibrosis and pain, previous hx of L breast ca in 2021 with L lumpectomy and SLNB on 10/07/19 (grade 3, IDC with DCIS, 3.3cm, 0/6 nodes, ER/PR+, HER2-), completed chemo from 8/21 to 12/21 and then radiation from 1/22-2/22, and has been taking anastrozole, COPD, DM ? ?PAIN:  ?Are you having pain? No ? ?PRECAUTIONS: Other: at risk of lymphedema ? ?WEIGHT BEARING RESTRICTIONS No ? ?FALLS:  ?Has patient fallen in last 6 months? No, Number of falls: 0 ? ?LIVING ENVIRONMENT: ?Lives with: lives with their spouse ?Lives in: House/apartment ?Stairs: Yes; External: 4 steps; none ?Has following equipment at home: None ? 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 ?LEISURE: pt does not currently exercise ? ?HAND DOMINANCE : right  ? ?PRIOR LEVEL OF FUNCTION: Independent ? ?PATIENT GOALS to go back to work ? ? ?OBJECTIVE ? ?COGNITION: ? Overall cognitive status: Within functional limits for tasks assessed  ? ?PALPATION: Very fibrotic tissue in L chest ? ?OBSERVATIONS / OTHER ASSESSMENTS: redness across L chest from previous radiation, fibrotic tissue and tightness across L chest is leading to decreased L shoulder ROM ? ?POSTURE: forward head, rounded shoulders ? ?UPPER EXTREMITY AROM/PROM: ? ?A/PROM RIGHT  06/25/2021 ?  ?Shoulder extension 85  ?Shoulder flexion 165  ?Shoulder abduction 168  ?Shoulder internal rotation 56  ?Shoulder external rotation 94  ?  (Blank rows = not tested) ? ?A/PROM LEFT  06/25/2021  ?Shoulder extension 34  ?Shoulder flexion 103  ?Shoulder abduction 92  ?Shoulder internal rotation 69  ?Shoulder external rotation 58  ?  (Blank rows = not tested) ? ? ? ?UPPER EXTREMITY STRENGTH: RUE 5/5 did not test L due to limited ROM and pain ? ? ?LYMPHEDEMA ASSESSMENTS:  ? ?SURGERY TYPE/DATE: 10/07/2019- L lumpectomy (0/6), 05/06/21-L  mastectomy ? ?NUMBER OF LYMPH NODES REMOVED: 6 nodes all negative ? ?  CHEMOTHERAPY: completed in 2021 ? ?RADIATION:completed in early 2022 ? ?HORMONE TREATMENT: currently taking ? ?INFECTIONS: none ? ?LYMPHEDEMA ASSESSMENTS:  ? ?[place] RIGHT  06/25/2021  ?10 cm proximal to olecranon process 25  ?Olecranon process 23.2  ?10 cm proximal to ulnar styloid process 19.5  ?Just proximal to ulnar styloid process 14.5  ?Across hand at thumb web space 18.8  ?At base of 2nd digit 6.2  ?(Blank rows = not tested) ? ?[place] LEFT  06/25/2021  ?10 cm proximal to olecranon process 25.5  ?Olecranon process 23  ?10 cm proximal to ulnar styloid process 19  ?Just proximal to ulnar styloid process 15.1  ?Across hand at thumb web space 18.5  ?At base of 2nd digit 6.2  ?(Blank rows = not tested) ? ? ?FUNCTIONAL TESTS:  ?30 seconds chair stand test: 18 reps which is good for her age ? ?GAIT: WFL ? ? ?QUICK DASH SURVEY:  ? Katina Dung - 06/25/21 0001   ? ? Open a tight or new jar Moderate difficulty   ? Do heavy household chores (wash walls, wash floors) Moderate difficulty   ? Carry a shopping bag or briefcase Moderate difficulty   ? Wash your back Unable   ? Use a knife to cut food Mild difficulty   ? Recreational activities in which you take some force or impact through your arm, shoulder, or hand (golf, hammering, tennis) Mild difficulty   ? During the past week, to what extent has your arm, shoulder or hand problem interfered with your normal social activities with family, friends, neighbors, or groups? Modererately   ? During the past week, to what extent has your arm, shoulder or hand problem limited your work or other regular daily activities Modererately   ? Arm, shoulder, or hand pain. Moderate   ? Tingling (pins and needles) in your arm, shoulder, or hand Moderate   ? Difficulty Sleeping No difficulty   ? DASH Score 45.45 %   ? ?  ?  ? ?  ? ? ? ? ?TODAY'S TREATMENT  ?None today due to time constraints ? ?PATIENT EDUCATION:   ?Education details: educated pt about scar mobilization and gently soft tissue mobilization to L chest, ABC class, anatomy and physiology of the lymphatic system, signs of lymphedema  ?Person educated: Patient ?Educ

## 2021-06-28 ENCOUNTER — Ambulatory Visit: Payer: Medicaid Other

## 2021-06-28 ENCOUNTER — Other Ambulatory Visit: Payer: Self-pay

## 2021-06-28 DIAGNOSIS — L599 Disorder of the skin and subcutaneous tissue related to radiation, unspecified: Secondary | ICD-10-CM | POA: Diagnosis not present

## 2021-06-28 DIAGNOSIS — Z483 Aftercare following surgery for neoplasm: Secondary | ICD-10-CM | POA: Diagnosis not present

## 2021-06-28 DIAGNOSIS — C50412 Malignant neoplasm of upper-outer quadrant of left female breast: Secondary | ICD-10-CM | POA: Diagnosis not present

## 2021-06-28 DIAGNOSIS — M25612 Stiffness of left shoulder, not elsewhere classified: Secondary | ICD-10-CM | POA: Diagnosis not present

## 2021-06-28 DIAGNOSIS — M25512 Pain in left shoulder: Secondary | ICD-10-CM | POA: Diagnosis not present

## 2021-06-28 DIAGNOSIS — Z170 Estrogen receptor positive status [ER+]: Secondary | ICD-10-CM | POA: Diagnosis not present

## 2021-06-28 NOTE — Therapy (Signed)
?OUTPATIENT PHYSICAL THERAPY TREATMENT NOTE ? ? ?Patient Name: Julia [name] ?MRN: [id number] ?DOB:1965/07/24, 56 y.o., female ?Today's Date: 06/28/2021 ? ?PCP: Tonia Ghent, MD ?REFERRING PROVIDER: Jovita Kussmaul, MD ? ? PT End of Session - 06/28/21 1111   ? ? Visit Number 2   ? Number of Visits 8   ? Date for PT Re-Evaluation 07/23/21   ? PT Start Time 1108   ? PT Stop Time 1202   ? PT Time Calculation (min) 54 min   ? Activity Tolerance Patient tolerated treatment well   ? Behavior During Therapy [facility] for tasks assessed/performed   ? ?  ?  ? ?  ? ? ?Past Medical History:  ?Diagnosis Date  ? Acute bronchitis 04/19/2021  ? Anxiety   ? over cancer diagnosis  ? Asthma   ? bronchial asthma   ? Cancer ([place]) 09/2019  ? left breast invasive mammary cancer  ? Chronic back pain   ? COPD (chronic obstructive pulmonary disease) ([place])   ? smokes 1 1/2 ppd  ? Diabetes mellitus without complication ([place])   ? GERD (gastroesophageal reflux disease)   ? Personal history of chemotherapy   ? Personal history of radiation therapy   ? Pneumonia   ? hx of 2018   ? Smoking   ? ?Past Surgical History:  ?Procedure Laterality Date  ? BREAST BIOPSY Left 09/2019  ? x2  ? BREAST BIOPSY Left 11/22/2020  ? BREAST LUMPECTOMY Left 10/2019  ? BREAST LUMPECTOMY WITH SENTINEL LYMPH NODE BIOPSY Left 10/07/2019  ? Procedure: LEFT BREAST LUMPECTOMY WITH SENTINEL LYMPH NODE BX;  Surgeon: Jovita Kussmaul, MD;  Location: [place];  Service: General;  Laterality: Left;  PEC BLOCK  ? CHOLECYSTECTOMY    ? PORTACATH PLACEMENT Right 11/10/2019  ? Procedure: INSERTION PORT-A-CATH;  Surgeon: Jovita Kussmaul, MD;  Location[facility];  Service: General;  Laterality: Right;  ? TONSILLECTOMY    ? TOTAL MASTECTOMY Left 05/06/2021  ? Procedure: LEFT TOTAL MASTECTOMY;  Surgeon: Jovita Kussmaul, MD;  Location: [place];  Service: General;  Laterality: Left;  ? VULVECTOMY N/A 01/30/2015  ? Procedure: WIDE LOCAL EXCISION VULVA;  Surgeon: Everitt Amber, MD;   Location: [facility];  Service: Gynecology;  Laterality: N/A;  ? ?Patient Active Problem List  ? Diagnosis Date Noted  ? Cancer of left female breast ([place]) 05/06/2021  ? Surgical counseling visit 04/29/2021  ? Bronchitis 04/19/2021  ? Carpal tunnel syndrome 10/21/2020  ? Osteoporosis 10/19/2020  ? Diabetes mellitus without complication ([place]) 62/13/0865  ? Port-A-Cath in place 11/29/2019  ? Malignant neoplasm of upper-outer quadrant of left breast in female, estrogen receptor positive ([place]) 09/01/2019  ? Encounter for chronic pain management 07/21/2019  ? Essential (primary) hypertension 02/25/2019  ? Radiculopathy, lumbar region 02/25/2019  ? Migraine headache 02/04/2018  ? Osteoarthritis of facet joint of lumbar spine 08/18/2017  ? Sciatica, left side 07/18/2017  ? Syncope 04/14/2017  ? Cough 08/07/2016  ? Rash 07/09/2016  ? 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  ? Advance care planning 12/22/2014  ? Plantar fasciitis 10/12/2014  ? Shingles 11/14/2012  ? Routine general medical examination at a health care facility 05/07/2011  ? SKIN LESION 06/22/2008  ? TOBACCO ABUSE 01/27/2007  ? HYPERLIPIDEMIA, MIXED 12/14/2006  ? ? ?REFERRING DIAG: C50.412 (ICD-10-CM) - Malignant neoplasm of upper-outer quadrant of left female breast Z17.0 (ICD-10-CM) - Estrogen receptor positive status (ER+)  ? ?THERAPY DIAG:  ?Stiffness of left shoulder, not  elsewhere classified ? ?Acute pain of left shoulder ? ?Disorder of the skin and subcutaneous tissue related to radiation, unspecified ? ?Aftercare following surgery for neoplasm ? ?Malignant neoplasm of upper-outer quadrant of left breast in female, estrogen receptor positive ([place]) ? 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 ?PRECAUTIONS: Other: at risk of lymphedema ? ?SUBJECTIVE: I took my pain meds before I came so I'm not hurting right now.  ? ?PAIN:  ?Are you having pain? No ? ? ?OBJECTIVE ?  ?COGNITION: ?           Overall cognitive status: Within functional limits for tasks assessed  ?  ?PALPATION: Very fibrotic tissue in L chest ?  ?OBSERVATIONS / OTHER ASSESSMENTS: redness across L chest from previous radiation, fibrotic tissue and tightness across L chest is leading to decreased L shoulder ROM ?  ?POSTURE: forward head, rounded shoulders ?  ?UPPER EXTREMITY AROM/PROM: ?  ?A/PROM RIGHT  06/25/2021 ?   ?Shoulder extension 85  ?Shoulder flexion 165  ?Shoulder abduction 168  ?Shoulder internal rotation 56  ?Shoulder external rotation 94  ?                        (Blank rows = not tested) ?  ?A/PROM LEFT  06/25/2021  ?Shoulder extension 34  ?Shoulder flexion 103  ?Shoulder abduction 92  ?Shoulder internal rotation 69  ?Shoulder external rotation 58  ?                        (Blank rows = not tested) ?  ?  ?  ?UPPER EXTREMITY STRENGTH: RUE 5/5 did not test L due to limited ROM and pain ?  ?  ?LYMPHEDEMA ASSESSMENTS:  ?  ?SURGERY TYPE/DATE: 10/07/2019- L lumpectomy (0/6), 05/06/21-L mastectomy ?  ?NUMBER OF LYMPH NODES REMOVED: 6 nodes all negative ?  ?CHEMOTHERAPY: completed in 2021 ?  ?RADIATION:completed in early 2022 ?  ?HORMONE TREATMENT: currently taking ?  ?INFECTIONS: none ?  ?LYMPHEDEMA ASSESSMENTS:  ?  ?LANDMARK RIGHT  06/25/2021  ?10 cm proximal to olecranon process 25  ?Olecranon process 23.2  ?10 cm proximal to ulnar styloid process 19.5  ?Just proximal to ulnar styloid process 14.5  ?Across hand at thumb web space 18.8  ?At base of 2nd digit 6.2  ?(Blank rows = not tested) ?  ?[place] LEFT  06/25/2021  ?10 cm proximal to olecranon process 25.5  ?Olecranon process 23  ?10 cm proximal to ulnar styloid process 19  ?Just proximal to ulnar styloid process 15.1  ?Across hand at thumb web space 18.5   ?At base of 2nd digit 6.2  ?(Blank rows = not tested) ? ? ?  ?  ?TODAY'S TREATMENT  ? ? 06/28/21: ?Therapeutic Ex: ?Roll ball up wall into flexion 5 reps returning therapist demo, pt with slow motion due to muscle guarding so cuing throughout to work on relaxing, then seated on mat table for scaption with Lt UE resting arm on ball and leaning into scaption 7 reps. ?Pulleys: Flexion and scaption x2 mins each returning therapist demo and VCs throughout to decrease Lt scapular compensation ?Manual Therapy:  ?In Supine: Rt shoulder P/ROM flexion, abd  and D2 to pts tolerance and VCs throughout to relax due to muscle guarding ?MFR: Gentle at Rt axilla during P/ROM ?STM: In Supine to Lt chest wall superior and inferior to incision, also gently at axilla and upper arm where palpable tightness ? ?06/25/21: ?None today due to time constraints ?  ?PATIENT EDUCATION:  ?06/25/21: ?Education details: educated pt about scar mobilization and gently soft tissue mobilization to L chest, ABC class, anatomy and physiology of the lymphatic system, signs of lymphedema  ?Person educated: Patient ?Education method: Explanation and Demonstration ?Education comprehension: verbalized understanding ?  ?  ?HOME EXERCISE PROGRAM: ?Gentle scar and soft tissue mobilization to L chest ?  ?ASSESSMENT: ?  ?CLINICAL IMPRESSION: ? Pt with tightness and muscle guarding during AA/ROM but was able to relax well with multiple VCs during. Also continued with need for VCs to relax during manual therapy due to muscle guarding but pt was able to do so with cuing and good improvement with end motions noted by end of session . ?  ?  ?OBJECTIVE IMPAIRMENTS decreased ROM, decreased strength, increased edema, increased fascial restrictions, impaired flexibility, impaired UE functional use, postural dysfunction, and pain.  ?  ?ACTIVITY LIMITATIONS cleaning, community activity, meal prep, occupation, laundry, and yard work.  ?  ?PERSONAL FACTORS Fitness and  Past/current experiences (hx of radiation) are also affecting patient's functional outcome.  ?  ?  ?REHAB POTENTIAL: Good ?  ?CLINICAL DECISION MAKING: Stable/uncomplicated ?  ?EVALUATION COMPLEXITY: Low ?  ?GOALS: ?Goal

## 2021-07-01 ENCOUNTER — Other Ambulatory Visit: Payer: Self-pay

## 2021-07-01 ENCOUNTER — Encounter: Payer: Self-pay | Admitting: Physical Therapy

## 2021-07-01 ENCOUNTER — Ambulatory Visit: Payer: Medicaid Other | Admitting: Physical Therapy

## 2021-07-01 DIAGNOSIS — M25512 Pain in left shoulder: Secondary | ICD-10-CM

## 2021-07-01 DIAGNOSIS — Z170 Estrogen receptor positive status [ER+]: Secondary | ICD-10-CM | POA: Diagnosis not present

## 2021-07-01 DIAGNOSIS — M25612 Stiffness of left shoulder, not elsewhere classified: Secondary | ICD-10-CM | POA: Diagnosis not present

## 2021-07-01 DIAGNOSIS — L599 Disorder of the skin and subcutaneous tissue related to radiation, unspecified: Secondary | ICD-10-CM | POA: Diagnosis not present

## 2021-07-01 DIAGNOSIS — C50412 Malignant neoplasm of upper-outer quadrant of left female breast: Secondary | ICD-10-CM | POA: Diagnosis not present

## 2021-07-01 DIAGNOSIS — Z483 Aftercare following surgery for neoplasm: Secondary | ICD-10-CM

## 2021-07-01 NOTE — Therapy (Addendum)
?OUTPATIENT PHYSICAL THERAPY TREATMENT NOTE ? ? ?Patient Name: Julia [name] ?MRN: [id number] ?DOB:[birth year], 56 y.o., female ?Today's Date: 07/01/2021 ? ?PCP: Tonia Ghent, MD ?REFERRING PROVIDER: Jovita Kussmaul, MD ? ? PT End of Session - 07/01/21 1007   ? ? Visit Number 3   ? Number of Visits 8   ? Date for PT Re-Evaluation 07/23/21   ? PT Start Time 1006   ? PT Stop Time 1052   ? PT Time Calculation (min) 46 min   ? Activity Tolerance Patient tolerated treatment well   ? Behavior During Therapy [facility] for tasks assessed/performed   ? ?  ?  ? ?  ? ? ?Past Medical History:  ?Diagnosis Date  ? Acute bronchitis 04/19/2021  ? Anxiety   ? over cancer diagnosis  ? Asthma   ? bronchial asthma   ? Cancer ([place]) 09/2019  ? left breast invasive mammary cancer  ? Chronic back pain   ? COPD (chronic obstructive pulmonary disease) ([place])   ? smokes 1 1/2 ppd  ? Diabetes mellitus without complication ([place])   ? GERD (gastroesophageal reflux disease)   ? Personal history of chemotherapy   ? Personal history of radiation therapy   ? Pneumonia   ? hx of 2018   ? Smoking   ? ?Past Surgical History:  ?Procedure Laterality Date  ? BREAST BIOPSY Left 09/2019  ? x2  ? BREAST BIOPSY Left 11/22/2020  ? BREAST LUMPECTOMY Left 10/2019  ? BREAST LUMPECTOMY WITH SENTINEL LYMPH NODE BIOPSY Left 10/07/2019  ? Procedure: LEFT BREAST LUMPECTOMY WITH SENTINEL LYMPH NODE BX;  Surgeon: Jovita Kussmaul, MD;  Location: [place];  Service: General;  Laterality: Left;  PEC BLOCK  ? CHOLECYSTECTOMY    ? PORTACATH PLACEMENT Right 11/10/2019  ? Procedure: INSERTION PORT-A-CATH;  Surgeon: Jovita Kussmaul, MD;  [facility];  Service: General;  Laterality: Right;  ? TONSILLECTOMY    ? TOTAL MASTECTOMY Left 05/06/2021  ? Procedure: LEFT TOTAL MASTECTOMY;  Surgeon: Jovita Kussmaul, MD;  Location: [place];  Service: General;  Laterality: Left;  ? VULVECTOMY N/A 01/30/2015  ? Procedure: WIDE LOCAL EXCISION VULVA;  Surgeon: Everitt Amber, MD;   [facility];  Service: Gynecology;  Laterality: N/A;  ? ?Patient Active Problem List  ? Diagnosis Date Noted  ? Cancer of left female breast ([place]) 05/06/2021  ? Surgical counseling visit 04/29/2021  ? Bronchitis 04/19/2021  ? Carpal tunnel syndrome 10/21/2020  ? Osteoporosis 10/19/2020  ? Diabetes mellitus without complication ([place]) 40/98/1191  ? Port-A-Cath in place 11/29/2019  ? Malignant neoplasm of upper-outer quadrant of left breast in female, estrogen receptor positive ([place]) 09/01/2019  ? Encounter for chronic pain management 07/21/2019  ? Essential (primary) hypertension 02/25/2019  ? Radiculopathy, lumbar region 02/25/2019  ? Migraine headache 02/04/2018  ? Osteoarthritis of facet joint of lumbar spine 08/18/2017  ? Sciatica, left side 07/18/2017  ? Syncope 04/14/2017  ? Cough 08/07/2016  ? Rash 07/09/2016  ? 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  ? Advance care planning 12/22/2014  ? Plantar fasciitis 10/12/2014  ? Shingles 11/14/2012  ? Routine general medical examination at a health care facility 05/07/2011  ? SKIN LESION 06/22/2008  ? TOBACCO ABUSE 01/27/2007  ? HYPERLIPIDEMIA, MIXED 12/14/2006  ? ? ?REFERRING DIAG: C50.412 (ICD-10-CM) - Malignant neoplasm of upper-outer quadrant of left female breast Z17.0 (ICD-10-CM) - Estrogen receptor positive status (ER+)  ? ?THERAPY DIAG:  ?Stiffness of left shoulder, not  elsewhere classified ? ?Acute pain of left shoulder ? ?Disorder of the skin and subcutaneous tissue related to radiation, unspecified ? ?Aftercare following surgery for neoplasm ? ?Malignant neoplasm of upper-outer quadrant of left breast in female, estrogen receptor positive ([place]) ? 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 ?PRECAUTIONS: Other: at risk of lymphedema ? ?SUBJECTIVE: My wrist is hurting today. I don't know what I did to it.  ? ?PAIN:  ?Are you having pain? Yes: NPRS scale: 6/10 ?Pain location: R wrist ?Pain description: sharp ?Aggravating factors: picking up things, turning steering wheel ?Relieving factors: nothing ? ? ?OBJECTIVE ?  ?COGNITION: ?           Overall cognitive status: Within functional limits for tasks assessed  ?  ?PALPATION: Very fibrotic tissue in L chest ?  ?OBSERVATIONS / OTHER ASSESSMENTS: redness across L chest from previous radiation, fibrotic tissue and tightness across L chest is leading to decreased L shoulder ROM ?  ?POSTURE: forward head, rounded shoulders ?  ?UPPER EXTREMITY AROM/PROM: ?  ?A/PROM RIGHT  06/25/2021 ?   ?Shoulder extension 85  ?Shoulder flexion 165  ?Shoulder abduction 168  ?Shoulder internal rotation 56  ?Shoulder external rotation 94  ?                        (Blank rows = not tested) ?  ?A/PROM LEFT  06/25/2021  ?Shoulder extension 34  ?Shoulder flexion 103  ?Shoulder abduction 92  ?Shoulder internal rotation 69  ?Shoulder external rotation 58  ?                        (Blank rows = not tested) ?  ?  ?  ?UPPER EXTREMITY STRENGTH: RUE 5/5 did not test L due to limited ROM and pain ?  ?  ?LYMPHEDEMA ASSESSMENTS:  ?  ?SURGERY TYPE/DATE: 10/07/2019- L lumpectomy (0/6), 05/06/21-L mastectomy ?  ?NUMBER OF LYMPH NODES REMOVED: 6 nodes all negative ?  ?CHEMOTHERAPY: completed in 2021 ?  ?RADIATION:completed in early 2022 ?  ?HORMONE TREATMENT: currently taking ?  ?INFECTIONS: none ?  ?LYMPHEDEMA ASSESSMENTS:  ?  ?LANDMARK RIGHT  06/25/2021  ?10 cm proximal to olecranon process 25  ?Olecranon process 23.2  ?10 cm proximal to ulnar styloid process 19.5  ?Just proximal to ulnar styloid process 14.5  ?Across hand at thumb web space 18.8  ?At base of 2nd digit 6.2  ?(Blank rows = not tested) ?  ?[place] LEFT  06/25/2021  ?10 cm proximal to olecranon process 25.5   ?Olecranon process 23  ?10 cm proximal to ulnar styloid process 19  ?Just proximal to ulnar styloid process 15.1  ?Across hand at thumb web space 18.5  ?At base of 2nd digit 6.2  ?(Blank rows = not tested) ? ? ?  ?  ?TODAY'S TREATMENT  ? ? 07/01/21 ?Therapeutic Ex:  ? -Pulleys x 2 min in direction of flexion and 2 min in direction of abduction with v/c to keep shoulders relaxed throughout to decrease compensation ? - Ball up wall x 10 reps in direction of flexion and 10 reps in direction of L abduction with pt requiring less cueing today for  muscular guarding ?Manual Therapy: ?In supine: L shoulder PROM in to flexion, abduction and ER to pt's tolerance with pt able to gain full PROM by end of session ?STM: to L chest wall superior and inferior to incision and to L axilla ? ? 06/28/21: ?Therapeutic Ex: ?Roll ball up wall into flexion 5 reps returning therapist demo, pt with slow motion due to muscle guarding so cuing throughout to work on relaxing, then seated on mat table for scaption with Lt UE resting arm on ball and leaning into scaption 7 reps. ?Pulleys: Flexion and scaption x2 mins each returning therapist demo and VCs throughout to decrease Lt scapular compensation ?Manual Therapy:  ?In Supine: Lt shoulder P/ROM flexion, abd and D2 to pts tolerance and VCs throughout to relax due to muscle guarding ?MFR: Gentle at Lt axilla during P/ROM ?STM: In Supine to Lt chest wall superior and inferior to incision, also gently at axilla and upper arm where palpable tightness ? ?06/25/21: ?None today due to time constraints ?  ?PATIENT EDUCATION:  ?06/25/21: ?Education details: educated pt about scar mobilization and gently soft tissue mobilization to L chest, ABC class, anatomy and physiology of the lymphatic system, signs of lymphedema  ?Person educated: Patient ?Education method: Explanation and Demonstration ?Education comprehension: verbalized understanding ?  ?  ?HOME EXERCISE PROGRAM: ?Gentle scar and soft tissue  mobilization to L chest ?  ?ASSESSMENT: ?  ?CLINICAL IMPRESSION: ?Pt demonstrated improvement today with muscle guarding and did not require as many verbal cues to relax. Pt still required v/c to decrease shoulde

## 2021-07-03 ENCOUNTER — Other Ambulatory Visit: Payer: Self-pay

## 2021-07-03 ENCOUNTER — Encounter: Payer: Self-pay | Admitting: Physical Therapy

## 2021-07-03 ENCOUNTER — Ambulatory Visit: Payer: Medicaid Other | Admitting: Physical Therapy

## 2021-07-03 DIAGNOSIS — C50412 Malignant neoplasm of upper-outer quadrant of left female breast: Secondary | ICD-10-CM | POA: Diagnosis not present

## 2021-07-03 DIAGNOSIS — Z170 Estrogen receptor positive status [ER+]: Secondary | ICD-10-CM | POA: Diagnosis not present

## 2021-07-03 DIAGNOSIS — Z483 Aftercare following surgery for neoplasm: Secondary | ICD-10-CM | POA: Diagnosis not present

## 2021-07-03 DIAGNOSIS — M25512 Pain in left shoulder: Secondary | ICD-10-CM

## 2021-07-03 DIAGNOSIS — L599 Disorder of the skin and subcutaneous tissue related to radiation, unspecified: Secondary | ICD-10-CM | POA: Diagnosis not present

## 2021-07-03 DIAGNOSIS — M25612 Stiffness of left shoulder, not elsewhere classified: Secondary | ICD-10-CM

## 2021-07-03 NOTE — Therapy (Signed)
?OUTPATIENT PHYSICAL THERAPY TREATMENT NOTE ? ? ?Patient Name: Julia [name] ?MRN: [id number] ?DOB:[birth year], 56 y.o., female ?Today's Date: 07/03/2021 ? ?PCP: Tonia Ghent, MD ?REFERRING PROVIDER: Jovita Kussmaul, MD ? ? PT End of Session - 07/03/21 0953   ? ? Visit Number 4   ? Number of Visits 8   ? Date for PT Re-Evaluation 07/23/21   ? PT Start Time [phone]   ? PT Stop Time 1051   ? PT Time Calculation (min) 55 min   ? Activity Tolerance Patient tolerated treatment well   ? Behavior During Therapy [facility] for tasks assessed/performed   ? ?  ?  ? ?  ? ? ?Past Medical History:  ?Diagnosis Date  ? Acute bronchitis 04/19/2021  ? Anxiety   ? over cancer diagnosis  ? Asthma   ? bronchial asthma   ? Cancer ([place]) 09/2019  ? left breast invasive mammary cancer  ? Chronic back pain   ? COPD (chronic obstructive pulmonary disease) ([place])   ? smokes 1 1/2 ppd  ? Diabetes mellitus without complication ([place])   ? GERD (gastroesophageal reflux disease)   ? Personal history of chemotherapy   ? Personal history of radiation therapy   ? Pneumonia   ? hx of 2018   ? Smoking   ? ?Past Surgical History:  ?Procedure Laterality Date  ? BREAST BIOPSY Left 09/2019  ? x2  ? BREAST BIOPSY Left 11/22/2020  ? BREAST LUMPECTOMY Left 10/2019  ? BREAST LUMPECTOMY WITH SENTINEL LYMPH NODE BIOPSY Left 10/07/2019  ? Procedure: LEFT BREAST LUMPECTOMY WITH SENTINEL LYMPH NODE BX;  Surgeon: Jovita Kussmaul, MD;  Location: [place];  Service: General;  Laterality: Left;  PEC BLOCK  ? CHOLECYSTECTOMY    ? PORTACATH PLACEMENT Right 11/10/2019  ? Procedure: INSERTION PORT-A-CATH;  Surgeon: Jovita Kussmaul, MD;  [facility];  Service: General;  Laterality: Right;  ? TONSILLECTOMY    ? TOTAL MASTECTOMY Left 05/06/2021  ? Procedure: LEFT TOTAL MASTECTOMY;  Surgeon: Jovita Kussmaul, MD;  Location: [place];  Service: General;  Laterality: Left;  ? VULVECTOMY N/A 01/30/2015  ? Procedure: WIDE LOCAL EXCISION VULVA;  Surgeon: Everitt Amber, MD;   Location: [facility];  Service: Gynecology;  Laterality: N/A;  ? ?Patient Active Problem List  ? Diagnosis Date Noted  ? Cancer of left female breast ([place]) 05/06/2021  ? Surgical counseling visit 04/29/2021  ? Bronchitis 04/19/2021  ? Carpal tunnel syndrome 10/21/2020  ? Osteoporosis 10/19/2020  ? Diabetes mellitus without complication ([place]) 33/35/4562  ? Port-A-Cath in place 11/29/2019  ? Malignant neoplasm of upper-outer quadrant of left breast in female, estrogen receptor positive ([place]) 09/01/2019  ? Encounter for chronic pain management 07/21/2019  ? Essential (primary) hypertension 02/25/2019  ? Radiculopathy, lumbar region 02/25/2019  ? Migraine headache 02/04/2018  ? Osteoarthritis of facet joint of lumbar spine 08/18/2017  ? Sciatica, left side 07/18/2017  ? Syncope 04/14/2017  ? Cough 08/07/2016  ? Rash 07/09/2016  ? 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  ? Advance care planning 12/22/2014  ? Plantar fasciitis 10/12/2014  ? Shingles 11/14/2012  ? Routine general medical examination at a health care facility 05/07/2011  ? SKIN LESION 06/22/2008  ? TOBACCO ABUSE 01/27/2007  ? HYPERLIPIDEMIA, MIXED 12/14/2006  ? ? ?REFERRING DIAG: C50.412 (ICD-10-CM) - Malignant neoplasm of upper-outer quadrant of left female breast Z17.0 (ICD-10-CM) - Estrogen receptor positive status (ER+)  ? ?THERAPY DIAG:  ?Stiffness of left shoulder, not  elsewhere classified ? ?Acute pain of left shoulder ? ?Disorder of the skin and subcutaneous tissue related to radiation, unspecified ? ?Aftercare following surgery for neoplasm ? ?Malignant neoplasm of upper-outer quadrant of left breast in female, estrogen receptor positive ([place]) ? 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 ?PRECAUTIONS: Other: at risk of lymphedema ? ?SUBJECTIVE: I think my ROM is improving. ? ?PAIN:  ?Are you having pain? Yes: NPRS scale: 5/10 ?Pain location: R wrist ?Pain description: sharp ?Aggravating factors: picking up things, turning steering wheel ?Relieving factors: nothing ? ? ?OBJECTIVE ?  ?COGNITION: ?           Overall cognitive status: Within functional limits for tasks assessed  ?  ?PALPATION: Very fibrotic tissue in L chest ?  ?OBSERVATIONS / OTHER ASSESSMENTS: redness across L chest from previous radiation, fibrotic tissue and tightness across L chest is leading to decreased L shoulder ROM ?  ?POSTURE: forward head, rounded shoulders ?  ?UPPER EXTREMITY AROM/PROM: ?  ?A/PROM RIGHT  06/25/2021 ?   ?Shoulder extension 85  ?Shoulder flexion 165  ?Shoulder abduction 168  ?Shoulder internal rotation 56  ?Shoulder external rotation 94  ?                        (Blank rows = not tested) ?  ?A/PROM LEFT  06/25/2021 LEFT 07/03/21  ?Shoulder extension 34 80  ?Shoulder flexion 103 154  ?Shoulder abduction 92 150  ?Shoulder internal rotation 69 71  ?Shoulder external rotation 58 95  ?                        (Blank rows = not tested) ?  ?  ?  ?UPPER EXTREMITY STRENGTH: RUE 5/5 did not test L due to limited ROM and pain ?  ?  ?LYMPHEDEMA ASSESSMENTS:  ?  ?SURGERY TYPE/DATE: 10/07/2019- L lumpectomy (0/6), 05/06/21-L mastectomy ?  ?NUMBER OF LYMPH NODES REMOVED: 6 nodes all negative ?  ?CHEMOTHERAPY: completed in 2021 ?  ?RADIATION:completed in early 2022 ?  ?HORMONE TREATMENT: currently taking ?  ?INFECTIONS: none ?  ?LYMPHEDEMA ASSESSMENTS:  ?  ?LANDMARK RIGHT  06/25/2021  ?10 cm proximal to olecranon process 25  ?Olecranon process 23.2  ?10 cm proximal to ulnar styloid process 19.5  ?Just proximal to ulnar styloid process 14.5  ?Across hand at thumb web space 18.8  ?At base of 2nd digit 6.2  ?(Blank rows = not tested) ?  ?[place] LEFT  06/25/2021  ?10 cm proximal to olecranon process 25.5   ?Olecranon process 23  ?10 cm proximal to ulnar styloid process 19  ?Just proximal to ulnar styloid process 15.1  ?Across hand at thumb web space 18.5  ?At base of 2nd digit 6.2  ?(Blank rows = not tested) ? ? ?  ?  ?TODAY'S TREATMENT  ? ?07/03/21- ?Therapeutic Ex:  ?-Pulleys x 2 min in direction of flexion and 2 min in direction of abduction with v/c to keep shoulders relaxed throughout to decrease compensation ?- Ball up wall x 10 reps in direction of flexion and 10 reps in direction of L abduction with pt requiring less cueing today for muscular guarding ?- AAROM  in to flexion using dowel with pt returning therapist demo x 10 reps and issued this as part of HEP ?-AAROM in to abduction on L using dowel with pt returning therapist demo x 10 reps and issued this as part of HEP ?Manual Therapy: ?In supine: R shoulder PROM in to flexion, abduction and ER to pt's tolerance with pt able to gain full PROM by end of session ?STM: to L chest wall superior and inferior to incision and to L axilla ?  ?07/01/21 ?Therapeutic Ex:  ? -Pulleys x 2 min in direction of flexion and 2 min in direction of abduction with v/c to keep shoulders relaxed throughout to decrease compensation ? - Ball up wall x 10 reps in direction of flexion and 10 reps in direction of L abduction with pt requiring less cueing today for muscular guarding ?Manual Therapy: ?In supine: R shoulder PROM in to flexion, abduction and ER to pt's tolerance with pt able to gain full PROM by end of session ?STM: to L chest wall superior and inferior to incision and to L axilla ? ? 06/28/21: ?Therapeutic Ex: ?Roll ball up wall into flexion 5 reps returning therapist demo, pt with slow motion due to muscle guarding so cuing throughout to work on relaxing, then seated on mat table for scaption with Lt UE resting arm on ball and leaning into scaption 7 reps. ?Pulleys: Flexion and scaption x2 mins each returning therapist demo and VCs throughout to decrease Lt scapular  compensation ?Manual Therapy:  ?In Supine: Rt shoulder P/ROM flexion, abd and D2 to pts tolerance and VCs throughout to relax due to muscle guarding ?MFR: Gentle at Rt axilla during P/ROM ?STM: In Supine to Lt che

## 2021-07-08 ENCOUNTER — Encounter: Payer: Self-pay | Admitting: Physical Therapy

## 2021-07-08 ENCOUNTER — Ambulatory Visit: Payer: Medicaid Other | Attending: General Surgery | Admitting: Physical Therapy

## 2021-07-08 DIAGNOSIS — C50412 Malignant neoplasm of upper-outer quadrant of left female breast: Secondary | ICD-10-CM | POA: Insufficient documentation

## 2021-07-08 DIAGNOSIS — Z483 Aftercare following surgery for neoplasm: Secondary | ICD-10-CM | POA: Insufficient documentation

## 2021-07-08 DIAGNOSIS — M25612 Stiffness of left shoulder, not elsewhere classified: Secondary | ICD-10-CM | POA: Insufficient documentation

## 2021-07-08 DIAGNOSIS — L599 Disorder of the skin and subcutaneous tissue related to radiation, unspecified: Secondary | ICD-10-CM | POA: Insufficient documentation

## 2021-07-08 DIAGNOSIS — M25512 Pain in left shoulder: Secondary | ICD-10-CM | POA: Diagnosis not present

## 2021-07-08 DIAGNOSIS — Z170 Estrogen receptor positive status [ER+]: Secondary | ICD-10-CM | POA: Insufficient documentation

## 2021-07-08 NOTE — Therapy (Signed)
?OUTPATIENT PHYSICAL THERAPY TREATMENT NOTE ? ? ?Patient Name: Julia [name] ?MRN: [id number] ?DOB:January 26, 1966, 56 y.o., female ?Today's Date: 07/08/2021 ? ?PCP: Tonia Ghent, MD ?REFERRING PROVIDER: Jovita Kussmaul, MD ? ? PT End of Session - 07/08/21 1150   ? ? Visit Number 5   ? Number of Visits 8   ? Date for PT Re-Evaluation 07/23/21   ? PT Start Time 1103   ? PT Stop Time 1151   ? PT Time Calculation (min) 48 min   ? Activity Tolerance Patient tolerated treatment well   ? Behavior During Therapy [facility] for tasks assessed/performed   ? ?  ?  ? ?  ? ? ? ?Past Medical History:  ?Diagnosis Date  ? Acute bronchitis 04/19/2021  ? Anxiety   ? over cancer diagnosis  ? Asthma   ? bronchial asthma   ? Cancer ([place]) 09/2019  ? left breast invasive mammary cancer  ? Chronic back pain   ? COPD (chronic obstructive pulmonary disease) ([place])   ? smokes 1 1/2 ppd  ? Diabetes mellitus without complication ([place])   ? GERD (gastroesophageal reflux disease)   ? Personal history of chemotherapy   ? Personal history of radiation therapy   ? Pneumonia   ? hx of 2018   ? Smoking   ? ?Past Surgical History:  ?Procedure Laterality Date  ? BREAST BIOPSY Left 09/2019  ? x2  ? BREAST BIOPSY Left 11/22/2020  ? BREAST LUMPECTOMY Left 10/2019  ? BREAST LUMPECTOMY WITH SENTINEL LYMPH NODE BIOPSY Left 10/07/2019  ? Procedure: LEFT BREAST LUMPECTOMY WITH SENTINEL LYMPH NODE BX;  Surgeon: Jovita Kussmaul, MD;  Location: [place];  Service: General;  Laterality: Left;  PEC BLOCK  ? CHOLECYSTECTOMY    ? PORTACATH PLACEMENT Right 11/10/2019  ? Procedure: INSERTION PORT-A-CATH;  Surgeon: Jovita Kussmaul, MD;  [facility];  Service: General;  Laterality: Right;  ? TONSILLECTOMY    ? TOTAL MASTECTOMY Left 05/06/2021  ? Procedure: LEFT TOTAL MASTECTOMY;  Surgeon: Jovita Kussmaul, MD;  Location: [place];  Service: General;  Laterality: Left;  ? VULVECTOMY N/A 01/30/2015  ? Procedure: WIDE LOCAL EXCISION VULVA;  Surgeon: Everitt Amber, MD;   [facility];  Service: Gynecology;  Laterality: N/A;  ? ?Patient Active Problem List  ? Diagnosis Date Noted  ? Cancer of left female breast ([place]) 05/06/2021  ? Surgical counseling visit 04/29/2021  ? Bronchitis 04/19/2021  ? Carpal tunnel syndrome 10/21/2020  ? Osteoporosis 10/19/2020  ? Diabetes mellitus without complication ([place]) 50/93/2671  ? Port-A-Cath in place 11/29/2019  ? Malignant neoplasm of upper-outer quadrant of left breast in female, estrogen receptor positive ([place]) 09/01/2019  ? Encounter for chronic pain management 07/21/2019  ? Essential (primary) hypertension 02/25/2019  ? Radiculopathy, lumbar region 02/25/2019  ? Migraine headache 02/04/2018  ? Osteoarthritis of facet joint of lumbar spine 08/18/2017  ? Sciatica, left side 07/18/2017  ? Syncope 04/14/2017  ? Cough 08/07/2016  ? Rash 07/09/2016  ? 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  ? Advance care planning 12/22/2014  ? Plantar fasciitis 10/12/2014  ? Shingles 11/14/2012  ? Routine general medical examination at a health care facility 05/07/2011  ? SKIN LESION 06/22/2008  ? TOBACCO ABUSE 01/27/2007  ? HYPERLIPIDEMIA, MIXED 12/14/2006  ? ? ?REFERRING DIAG: C50.412 (ICD-10-CM) - Malignant neoplasm of upper-outer quadrant of left female breast Z17.0 (ICD-10-CM) - Estrogen receptor positive status (ER+)  ? ?THERAPY DIAG:  ?Stiffness of left shoulder,  not elsewhere classified ? ?Acute pain of left shoulder ? ?Disorder of the skin and subcutaneous tissue related to radiation, unspecified ? ?Aftercare following surgery for neoplasm ? ?Malignant neoplasm of upper-outer quadrant of left breast in female, estrogen receptor positive ([place]) ? 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 ?PRECAUTIONS: Other: at risk of lymphedema ? ?SUBJECTIVE: This wrist is getting worse.  ?PAIN:  ?Are you having pain? Yes: NPRS scale: 8/10 ?Pain location: R wrist ?Pain description: sharp ?Aggravating factors: picking up things, turning steering wheel ?Relieving factors: nothing ? ? ?OBJECTIVE ?  ?COGNITION: ?           Overall cognitive status: Within functional limits for tasks assessed  ?  ?PALPATION: Very fibrotic tissue in L chest ?  ?OBSERVATIONS / OTHER ASSESSMENTS: redness across L chest from previous radiation, fibrotic tissue and tightness across L chest is leading to decreased L shoulder ROM ?  ?POSTURE: forward head, rounded shoulders ?  ?UPPER EXTREMITY AROM/PROM: ?  ?A/PROM RIGHT  06/25/2021 ?   ?Shoulder extension 85  ?Shoulder flexion 165  ?Shoulder abduction 168  ?Shoulder internal rotation 56  ?Shoulder external rotation 94  ?                        (Blank rows = not tested) ?  ?A/PROM LEFT  06/25/2021 LEFT 07/03/21  ?Shoulder extension 34 80  ?Shoulder flexion 103 154  ?Shoulder abduction 92 150  ?Shoulder internal rotation 69 71  ?Shoulder external rotation 58 95  ?                        (Blank rows = not tested) ?  ?  ?  ?UPPER EXTREMITY STRENGTH: RUE 5/5 did not test L due to limited ROM and pain ?  ?  ?LYMPHEDEMA ASSESSMENTS:  ?  ?SURGERY TYPE/DATE: 10/07/2019- L lumpectomy (0/6), 05/06/21-L mastectomy ?  ?NUMBER OF LYMPH NODES REMOVED: 6 nodes all negative ?  ?CHEMOTHERAPY: completed in 2021 ?  ?RADIATION:completed in early 2022 ?  ?HORMONE TREATMENT: currently taking ?  ?INFECTIONS: none ?  ?LYMPHEDEMA ASSESSMENTS:  ?  ?LANDMARK RIGHT  06/25/2021  ?10 cm proximal to olecranon process 25  ?Olecranon process 23.2  ?10 cm proximal to ulnar styloid process 19.5  ?Just proximal to ulnar styloid process 14.5  ?Across hand at thumb web space 18.8  ?At base of 2nd digit 6.2  ?(Blank rows = not tested) ?  ?[place] LEFT  06/25/2021  ?10 cm proximal to olecranon process 25.5   ?Olecranon process 23  ?10 cm proximal to ulnar styloid process 19  ?Just proximal to ulnar styloid process 15.1  ?Across hand at thumb web space 18.5  ?At base of 2nd digit 6.2  ?(Blank rows = not tested) ? ? ?  ?  ?TODAY'S TREATMENT  ? ?07/08/21- ?Therapeutic Ex:  ?-Pulleys x 2 min in direction of flexion and 2 min in direction of abduction with v/c to keep shoulders relaxed throughout to decrease compensation ?- Ball up wall x 10 reps in direction of flexion and 10 reps in direction of L abduction ?Manual Therapy: ?In supine: L shoulder PROM in to flexion, abduction  and ER to pt's tolerance with pt able to gain full PROM by end of session ?STM: to L chest wall superior and inferior to incision and to L axilla ?MLD: R axilla then anterior interaxillary pathway then R anterior axilla moving fluid across pathway and retracing all steps ? ?07/03/21- ?Therapeutic Ex:  ?-Pulleys x 2 min in direction of flexion and 2 min in direction of abduction with v/c to keep shoulders relaxed throughout to decrease compensation ?- Ball up wall x 10 reps in direction of flexion and 10 reps in direction of L abduction with pt requiring less cueing today for muscular guarding ?- AAROM in to flexion using dowel with pt returning therapist demo x 10 reps and issued this as part of HEP ?-AAROM in to abduction on L using dowel with pt returning therapist demo x 10 reps and issued this as part of HEP ?Manual Therapy: ?In supine: L shoulder PROM in to flexion, abduction and ER to pt's tolerance with pt able to gain full PROM by end of session ?STM: to L chest wall superior and inferior to incision and to L axilla ?  ?07/01/21 ?Therapeutic Ex:  ? -Pulleys x 2 min in direction of flexion and 2 min in direction of abduction with v/c to keep shoulders relaxed throughout to decrease compensation ? - Ball up wall x 10 reps in direction of flexion and 10 reps in direction of L abduction with pt requiring less cueing today for muscular  guarding ?Manual Therapy: ?In supine: L shoulder PROM in to flexion, abduction and ER to pt's tolerance with pt able to gain full PROM by end of session ?STM: to L chest wall superior and inferior to incision and to L axil

## 2021-07-10 ENCOUNTER — Encounter: Payer: Medicaid Other | Admitting: Physical Therapy

## 2021-07-12 ENCOUNTER — Other Ambulatory Visit: Payer: Self-pay | Admitting: Family Medicine

## 2021-07-14 ENCOUNTER — Encounter: Payer: Self-pay | Admitting: Family Medicine

## 2021-07-14 ENCOUNTER — Encounter: Payer: Self-pay | Admitting: Nurse Practitioner

## 2021-07-14 ENCOUNTER — Other Ambulatory Visit: Payer: Self-pay | Admitting: Hematology and Oncology

## 2021-07-15 ENCOUNTER — Ambulatory Visit: Payer: Medicaid Other | Admitting: Physical Therapy

## 2021-07-15 ENCOUNTER — Encounter: Payer: Self-pay | Admitting: Physical Therapy

## 2021-07-15 DIAGNOSIS — Z170 Estrogen receptor positive status [ER+]: Secondary | ICD-10-CM | POA: Diagnosis not present

## 2021-07-15 DIAGNOSIS — Z483 Aftercare following surgery for neoplasm: Secondary | ICD-10-CM

## 2021-07-15 DIAGNOSIS — M25512 Pain in left shoulder: Secondary | ICD-10-CM | POA: Diagnosis not present

## 2021-07-15 DIAGNOSIS — L599 Disorder of the skin and subcutaneous tissue related to radiation, unspecified: Secondary | ICD-10-CM

## 2021-07-15 DIAGNOSIS — M25612 Stiffness of left shoulder, not elsewhere classified: Secondary | ICD-10-CM

## 2021-07-15 DIAGNOSIS — C50412 Malignant neoplasm of upper-outer quadrant of left female breast: Secondary | ICD-10-CM | POA: Diagnosis not present

## 2021-07-15 NOTE — Therapy (Signed)
?OUTPATIENT PHYSICAL THERAPY TREATMENT NOTE ? ? ?Patient Name: Julia [name] ?MRN: [id number] ?DOB:November 04, 1965, 56 y.o., female ?Today's Date: 07/15/2021 ? ?PCP: Tonia Ghent, MD ?REFERRING PROVIDER: Jovita Kussmaul, MD ? ? PT End of Session - 07/15/21 0954   ? ? Visit Number 6   ? Number of Visits 8   ? Date for PT Re-Evaluation 07/23/21   ? PT Start Time [phone]   ? PT Stop Time [phone]   ? PT Time Calculation (min) 49 min   ? Activity Tolerance Patient tolerated treatment well   ? Behavior During Therapy [facility] for tasks assessed/performed   ? ?  ?  ? ?  ? ? ? ? ?Past Medical History:  ?Diagnosis Date  ? Acute bronchitis 04/19/2021  ? Anxiety   ? over cancer diagnosis  ? Asthma   ? bronchial asthma   ? Cancer ([place]) 09/2019  ? left breast invasive mammary cancer  ? Chronic back pain   ? COPD (chronic obstructive pulmonary disease) ([place])   ? smokes 1 1/2 ppd  ? Diabetes mellitus without complication ([place])   ? GERD (gastroesophageal reflux disease)   ? Personal history of chemotherapy   ? Personal history of radiation therapy   ? Pneumonia   ? hx of 2018   ? Smoking   ? ?Past Surgical History:  ?Procedure Laterality Date  ? BREAST BIOPSY Left 09/2019  ? x2  ? BREAST BIOPSY Left 11/22/2020  ? BREAST LUMPECTOMY Left 10/2019  ? BREAST LUMPECTOMY WITH SENTINEL LYMPH NODE BIOPSY Left 10/07/2019  ? Procedure: LEFT BREAST LUMPECTOMY WITH SENTINEL LYMPH NODE BX;  Surgeon: Jovita Kussmaul, MD;  Location: [place];  Service: General;  Laterality: Left;  PEC BLOCK  ? CHOLECYSTECTOMY    ? PORTACATH PLACEMENT Right 11/10/2019  ? Procedure: INSERTION PORT-A-CATH;  Surgeon: Jovita Kussmaul, MD;  [facility];  Service: General;  Laterality: Right;  ? TONSILLECTOMY    ? TOTAL MASTECTOMY Left 05/06/2021  ? Procedure: LEFT TOTAL MASTECTOMY;  Surgeon: Jovita Kussmaul, MD;  Location: [place];  Service: General;  Laterality: Left;  ? VULVECTOMY N/A 01/30/2015  ? Procedure: WIDE LOCAL EXCISION VULVA;  Surgeon: Everitt Amber, MD;   Location[facility];  Service: Gynecology;  Laterality: N/A;  ? ?Patient Active Problem List  ? Diagnosis Date Noted  ? Cancer of left female breast ([place]) 05/06/2021  ? Surgical counseling visit 04/29/2021  ? Bronchitis 04/19/2021  ? Carpal tunnel syndrome 10/21/2020  ? Osteoporosis 10/19/2020  ? Diabetes mellitus without complication ([place]) 40/81/4481  ? Port-A-Cath in place 11/29/2019  ? Malignant neoplasm of upper-outer quadrant of left breast in female, estrogen receptor positive ([place]) 09/01/2019  ? Encounter for chronic pain management 07/21/2019  ? Essential (primary) hypertension 02/25/2019  ? Radiculopathy, lumbar region 02/25/2019  ? Migraine headache 02/04/2018  ? Osteoarthritis of facet joint of lumbar spine 08/18/2017  ? Sciatica, left side 07/18/2017  ? Syncope 04/14/2017  ? Cough 08/07/2016  ? Rash 07/09/2016  ? 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  ? Advance care planning 12/22/2014  ? Plantar fasciitis 10/12/2014  ? Shingles 11/14/2012  ? Routine general medical examination at a health care facility 05/07/2011  ? SKIN LESION 06/22/2008  ? TOBACCO ABUSE 01/27/2007  ? HYPERLIPIDEMIA, MIXED 12/14/2006  ? ? ?REFERRING DIAG: C50.412 (ICD-10-CM) - Malignant neoplasm of upper-outer quadrant of left female breast Z17.0 (ICD-10-CM) - Estrogen receptor positive status (ER+)  ? ?THERAPY DIAG:  ?Stiffness of left  shoulder, not elsewhere classified ? ?Acute pain of left shoulder ? ?Disorder of the skin and subcutaneous tissue related to radiation, unspecified ? ?Aftercare following surgery for neoplasm ? ?Malignant neoplasm of upper-outer quadrant of left breast in female, estrogen receptor positive ([place]) ? 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 ?PRECAUTIONS: Other: at risk of lymphedema ? ?SUBJECTIVE: I have an appointment on the 18th for my wrist. I have been doing all of my exercises.  ?PAIN:  ?Are you having pain? Yes: NPRS scale: (not rated today)/10 ?Pain location: R wrist ?Pain description: sharp ?Aggravating factors: picking up things, turning steering wheel ?Relieving factors: nothing ? ? ?OBJECTIVE ?  ?COGNITION: ?           Overall cognitive status: Within functional limits for tasks assessed  ?  ?PALPATION: Very fibrotic tissue in L chest ?  ?OBSERVATIONS / OTHER ASSESSMENTS: redness across L chest from previous radiation, fibrotic tissue and tightness across L chest is leading to decreased L shoulder ROM ?  ?POSTURE: forward head, rounded shoulders ?  ?UPPER EXTREMITY AROM/PROM: ?  ?A/PROM RIGHT  06/25/2021 ?   ?Shoulder extension 85  ?Shoulder flexion 165  ?Shoulder abduction 168  ?Shoulder internal rotation 56  ?Shoulder external rotation 94  ?                        (Blank rows = not tested) ?  ?A/PROM LEFT  06/25/2021 LEFT 07/03/21 LEFT 07/15/21  ?Shoulder extension 34 80   ?Shoulder flexion 103 154 135 after AAROM 148  ?Shoulder abduction 92 150 122 after AAROM 138  ?Shoulder internal rotation 69 71   ?Shoulder external rotation 58 95   ?                        (Blank rows = not tested) ?  ?  ?  ?UPPER EXTREMITY STRENGTH: RUE 5/5 did not test L due to limited ROM and pain ?  ?  ?LYMPHEDEMA ASSESSMENTS:  ?  ?SURGERY TYPE/DATE: 10/07/2019- L lumpectomy (0/6), 05/06/21-L mastectomy ?  ?NUMBER OF LYMPH NODES REMOVED: 6 nodes all negative ?  ?CHEMOTHERAPY: completed in 2021 ?  ?RADIATION:completed in early 2022 ?  ?HORMONE TREATMENT: currently taking ?  ?INFECTIONS: none ?  ?LYMPHEDEMA ASSESSMENTS:  ?  ?LANDMARK RIGHT  06/25/2021  ?10 cm proximal to olecranon process 25  ?Olecranon process 23.2  ?10 cm proximal to ulnar styloid process 19.5  ?Just proximal to ulnar styloid process 14.5  ?Across hand at thumb web space  18.8  ?At base of 2nd digit 6.2  ?(Blank rows = not tested) ?  ?[place] LEFT  06/25/2021  ?10 cm proximal to olecranon process 25.5  ?Olecranon process 23  ?10 cm proximal to ulnar styloid process 19  ?Just proximal to ulnar styloid process 15.1  ?Across hand at thumb web space 18.5  ?At base of 2nd digit 6.2  ?(Blank rows = not tested) ? ? ?  ?  ?TODAY'S TREATMENT  ? ?07/15/21- ?Therapeutic Ex:  ?-Pulleys x 2 min in direction of flexion and 2 min in direction of abduction with v/c to keep shoulders relaxed throughout to decrease compensation ?-  Ball up wall x 10 reps in direction of flexion and 10 reps in direction of L abduction ?Manual Therapy: ?In R sidelying: scapular mobilization to L scapula in direction of protraction and retraction while passively moving LUE, increased tightness noted at first then eased, then performed STM to L rhomboids and lateral edge of lats where there was increased muscle tightness that was relieved with massage ?In supine: PROM to L shoulder in to flexion and abduction to pt's tolerance  ? ?07/08/21- ?Therapeutic Ex:  ?-Pulleys x 2 min in direction of flexion and 2 min in direction of abduction with v/c to keep shoulders relaxed throughout to decrease compensation ?- Ball up wall x 10 reps in direction of flexion and 10 reps in direction of L abduction ?Manual Therapy: ?In supine: L shoulder PROM in to flexion, abduction and ER to pt's tolerance with pt able to gain full PROM by end of session ?STM: to L chest wall superior and inferior to incision and to L axilla ?MLD: R axilla then anterior interaxillary pathway then R anterior axilla moving fluid across pathway and retracing all steps ? ?07/03/21- ?Therapeutic Ex:  ?-Pulleys x 2 min in direction of flexion and 2 min in direction of abduction with v/c to keep shoulders relaxed throughout to decrease compensation ?- Ball up wall x 10 reps in direction of flexion and 10 reps in direction of L abduction with pt requiring less cueing  today for muscular guarding ?- AAROM in to flexion using dowel with pt returning therapist demo x 10 reps and issued this as part of HEP ?-AAROM in to abduction on L using dowel with pt returning therapist demo

## 2021-07-16 ENCOUNTER — Other Ambulatory Visit: Payer: Self-pay | Admitting: Family Medicine

## 2021-07-16 ENCOUNTER — Encounter: Payer: Self-pay | Admitting: Adult Health

## 2021-07-16 ENCOUNTER — Telehealth: Payer: Self-pay | Admitting: Family Medicine

## 2021-07-16 MED ORDER — HYDROCODONE-ACETAMINOPHEN 5-325 MG PO TABS
1.0000 | ORAL_TABLET | Freq: Four times a day (QID) | ORAL | 0 refills | Status: DC | PRN
Start: 2021-07-16 — End: 2021-08-10

## 2021-07-16 NOTE — Telephone Encounter (Signed)
See phone note about refill.  I routed that to Dr. Lindi Adie for input.   ?

## 2021-07-16 NOTE — Telephone Encounter (Signed)
Dr. Shon Hale patient sent a refill request to me about her hydrocodone.  I saw that you had filled it recently.  I did not want to fill this without your okay.  If you are going to continue to address this then I greatly appreciate your help and I will defer to you.  Thank you. ?

## 2021-07-17 ENCOUNTER — Encounter: Payer: Self-pay | Admitting: Physical Therapy

## 2021-07-17 ENCOUNTER — Ambulatory Visit: Payer: Medicaid Other | Admitting: Physical Therapy

## 2021-07-17 DIAGNOSIS — L599 Disorder of the skin and subcutaneous tissue related to radiation, unspecified: Secondary | ICD-10-CM | POA: Diagnosis not present

## 2021-07-17 DIAGNOSIS — Z170 Estrogen receptor positive status [ER+]: Secondary | ICD-10-CM | POA: Diagnosis not present

## 2021-07-17 DIAGNOSIS — M25512 Pain in left shoulder: Secondary | ICD-10-CM

## 2021-07-17 DIAGNOSIS — Z483 Aftercare following surgery for neoplasm: Secondary | ICD-10-CM | POA: Diagnosis not present

## 2021-07-17 DIAGNOSIS — M25612 Stiffness of left shoulder, not elsewhere classified: Secondary | ICD-10-CM

## 2021-07-17 DIAGNOSIS — C50412 Malignant neoplasm of upper-outer quadrant of left female breast: Secondary | ICD-10-CM | POA: Diagnosis not present

## 2021-07-17 NOTE — Therapy (Addendum)
OUTPATIENT PHYSICAL THERAPY TREATMENT NOTE   Patient Name: Julia [name] MRN: [id number] DOB:06/15/1965, 56 y.o., female Today's Date: 07/17/2021  PCP: Tonia Ghent, MD REFERRING PROVIDER: Jovita Kussmaul, MD   PT End of Session - 07/17/21 [phone]     Visit Number 7    Number of Visits 8    Date for PT Re-Evaluation 07/23/21    PT Start Time 0906    PT Stop Time 0956    PT Time Calculation (min) 50 min    Activity Tolerance Patient tolerated treatment well    Behavior During Therapy [facility] for tasks assessed/performed               Past Medical History:  Diagnosis Date   Acute bronchitis 04/19/2021   Anxiety    over cancer diagnosis   Asthma    bronchial asthma    Cancer ([place]) 09/2019   left breast invasive mammary cancer   Chronic back pain    COPD (chronic obstructive pulmonary disease) ([place])    smokes 1 1/2 ppd   Diabetes mellitus without complication ([facility])    GERD (gastroesophageal reflux disease)    Personal history of chemotherapy    Personal history of radiation therapy    Pneumonia    hx of 2018    Smoking    Past Surgical History:  Procedure Laterality Date   BREAST BIOPSY Left 09/2019   x2   BREAST BIOPSY Left 11/22/2020   BREAST LUMPECTOMY Left 10/2019   BREAST LUMPECTOMY WITH SENTINEL LYMPH NODE BIOPSY Left 10/07/2019   Procedure: LEFT BREAST LUMPECTOMY WITH SENTINEL LYMPH NODE BX;  Surgeon: Jovita Kussmaul, MD;  Location: [place];  Service: General;  Laterality: Left;  PEC BLOCK   CHOLECYSTECTOMY     PORTACATH PLACEMENT Right 11/10/2019   Procedure: INSERTION PORT-A-CATH;  Surgeon: Jovita Kussmaul, MD;  [facility];  Service: General;  Laterality: Right;   TONSILLECTOMY     TOTAL MASTECTOMY Left 05/06/2021   Procedure: LEFT TOTAL MASTECTOMY;  Surgeon: Jovita Kussmaul, MD;  Location: [place];  Service: General;  Laterality: Left;   VULVECTOMY N/A 01/30/2015   Procedure: WIDE LOCAL EXCISION VULVA;  Surgeon: Everitt Amber, MD;   [facility];  Service: Gynecology;  Laterality: N/A;   Patient Active Problem List   Diagnosis Date Noted   Cancer of left female breast ([place]) 05/06/2021   Surgical counseling visit 04/29/2021   Bronchitis 04/19/2021   Carpal tunnel syndrome 10/21/2020   Osteoporosis 10/19/2020   Diabetes mellitus without complication ([place]) 02/21/3566   Port-A-Cath in place 11/29/2019   Malignant neoplasm of upper-outer quadrant of left breast in female, estrogen receptor positive ([place]) 09/01/2019   Encounter for chronic pain management 07/21/2019   Essential (primary) hypertension 02/25/2019   Radiculopathy, lumbar region 02/25/2019   Migraine headache 02/04/2018   Osteoarthritis of facet joint of lumbar spine 08/18/2017   Sciatica, left side 07/18/2017   Syncope 04/14/2017   Cough 08/07/2016   Rash 07/09/2016   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   Advance care planning 12/22/2014   Plantar fasciitis 10/12/2014   Shingles 11/14/2012   Routine general medical examination at a health care facility 05/07/2011   SKIN LESION 06/22/2008   TOBACCO ABUSE 01/27/2007   HYPERLIPIDEMIA, MIXED 12/14/2006    REFERRING DIAG: C50.412 (ICD-10-CM) - Malignant neoplasm of upper-outer quadrant of left female breast Z17.0 (ICD-10-CM) - Estrogen receptor positive status (ER+)   THERAPY DIAG:  Stiffness of left  shoulder, not elsewhere classified  Acute pain of left shoulder  Disorder of the skin and subcutaneous tissue related to radiation, unspecified  Aftercare following surgery for neoplasm  Malignant neoplasm of upper-outer quadrant of left breast in female, estrogen receptor positive ([place])  PERTINENT HISTORY: 05/06/21- L mastectomy due to left breast fibrosis and pain, previous hx of L breast ca in 2021 with L lumpectomy and SLNB on 10/07/19 (grade 3, IDC with DCIS, 3.3cm, 0/6 nodes, ER/PR+, HER2-), completed chemo from 8/21 to 12/21 and then radiation from 1/22-2/22, and has been  taking anastrozole, COPD, DM  PRECAUTIONS: Other: at risk of lymphedema  SUBJECTIVE: I think I am going to go to urgent care about my wrist. The pain is getting worse.  PAIN:  Are you having pain? Yes: NPRS scale: (8)/10 Pain location: R wrist Pain description: sharp Aggravating factors: picking up things, turning steering wheel Relieving factors: nothing   OBJECTIVE   COGNITION:            Overall cognitive status: Within functional limits for tasks assessed    PALPATION: Very fibrotic tissue in L chest   OBSERVATIONS / OTHER ASSESSMENTS: redness across L chest from previous radiation, fibrotic tissue and tightness across L chest is leading to decreased L shoulder ROM   POSTURE: forward head, rounded shoulders   UPPER EXTREMITY AROM/PROM:   A/PROM RIGHT  06/25/2021    Shoulder extension 85  Shoulder flexion 165  Shoulder abduction 168  Shoulder internal rotation 56  Shoulder external rotation 94                          (Blank rows = not tested)   A/PROM LEFT  06/25/2021 LEFT 07/03/21 LEFT 07/15/21  Shoulder extension 34 80   Shoulder flexion 103 154 135 after AAROM 148  Shoulder abduction 92 150 122 after AAROM 138  Shoulder internal rotation 69 71   Shoulder external rotation 58 95                           (Blank rows = not tested)       UPPER EXTREMITY STRENGTH: RUE 5/5 did not test L due to limited ROM and pain     LYMPHEDEMA ASSESSMENTS:    SURGERY TYPE/DATE: 10/07/2019- L lumpectomy (0/6), 05/06/21-L mastectomy   NUMBER OF LYMPH NODES REMOVED: 6 nodes all negative   CHEMOTHERAPY: completed in 2021   RADIATION:completed in early 2022   HORMONE TREATMENT: currently taking   INFECTIONS: none   LYMPHEDEMA ASSESSMENTS:    LANDMARK RIGHT  06/25/2021  10 cm proximal to olecranon process 25  Olecranon process 23.2  10 cm proximal to ulnar styloid process 19.5  Just proximal to ulnar styloid process 14.5  Across hand at thumb web space 18.8  At base of 2nd  digit 6.2  (Blank rows = not tested)   LANDMARK LEFT  06/25/2021  10 cm proximal to olecranon process 25.5  Olecranon process 23  10 cm proximal to ulnar styloid process 19  Just proximal to ulnar styloid process 15.1  Across hand at thumb web space 18.5  At base of 2nd digit 6.2  (Blank rows = not tested)       TODAY'S TREATMENT   07/17/21- Therapeutic Ex:  -Pulleys x 2 min in direction of flexion and 2 min in direction of abduction with v/c to keep shoulders relaxed throughout to decrease compensation - Ball up  wall x 10 reps in direction of flexion and 10 reps in direction of L abduction Manual Therapy: STM to pt's L chest to help mobilize the skin since there is barely any mobility and increased fibrosis Used adhesive remover to remove remaining glue since her surgery was 05/06/21, her scar is well healed In supine: PROM to L shoulder in to flexion and abduction to pt's tolerance   07/15/21- Therapeutic Ex:  -Pulleys x 2 min in direction of flexion and 2 min in direction of abduction with v/c to keep shoulders relaxed throughout to decrease compensation - Ball up wall x 10 reps in direction of flexion and 10 reps in direction of L abduction Manual Therapy: In R sidelying: scapular mobilization to L scapula in direction of protraction and retraction while passively moving LUE, increased tightness noted at first then eased, then performed STM to L rhomboids and lateral edge of lats where there was increased muscle tightness that was relieved with massage In supine: PROM to L shoulder in to flexion and abduction to pt's tolerance   07/08/21- Therapeutic Ex:  -Pulleys x 2 min in direction of flexion and 2 min in direction of abduction with v/c to keep shoulders relaxed throughout to decrease compensation - Ball up wall x 10 reps in direction of flexion and 10 reps in direction of L abduction Manual Therapy: In supine: L shoulder PROM in to flexion, abduction and ER to pt's tolerance  with pt able to gain full PROM by end of session STM: to L chest wall superior and inferior to incision and to L axilla MLD: R axilla then anterior interaxillary pathway then R anterior axilla moving fluid across pathway and retracing all steps  07/03/21- Therapeutic Ex:  -Pulleys x 2 min in direction of flexion and 2 min in direction of abduction with v/c to keep shoulders relaxed throughout to decrease compensation - Ball up wall x 10 reps in direction of flexion and 10 reps in direction of L abduction with pt requiring less cueing today for muscular guarding - AAROM in to flexion using dowel with pt returning therapist demo x 10 reps and issued this as part of HEP -AAROM in to abduction on L using dowel with pt returning therapist demo x 10 reps and issued this as part of HEP Manual Therapy: In supine: L shoulder PROM in to flexion, abduction and ER to pt's tolerance with pt able to gain full PROM by end of session STM: to L chest wall superior and inferior to incision and to L axilla   07/01/21 Therapeutic Ex:   -Pulleys x 2 min in direction of flexion and 2 min in direction of abduction with v/c to keep shoulders relaxed throughout to decrease compensation  - Ball up wall x 10 reps in direction of flexion and 10 reps in direction of L abduction with pt requiring less cueing today for muscular guarding Manual Therapy: In supine: L shoulder PROM in to flexion, abduction and ER to pt's tolerance with pt able to gain full PROM by end of session STM: to L chest wall superior and inferior to incision and to L axilla     PATIENT EDUCATION:  06/25/21: Education details: educated pt about scar mobilization and gently soft tissue mobilization to L chest, ABC class, anatomy and physiology of the lymphatic system, signs of lymphedema  Person educated: Patient Education method: Explanation and Demonstration Education comprehension: verbalized understanding     HOME EXERCISE PROGRAM: Gentle scar  and soft tissue mobilization  to L chest   ASSESSMENT:   CLINICAL IMPRESSION: Pt is continuing to have increasing wrist pain. She plans on going to urgent care after her appointment today. Continued with AAROM exercises today followed by PROM and STM to anterior chest where pt has decreased skin mobility. Used adhesive remover to remove remaining glue since her surgery was 1/30 to allow her to begin scar mobilization. Educated pt she can use cocoa butter across her L chest to help mobilize skin.      OBJECTIVE IMPAIRMENTS decreased ROM, decreased strength, increased edema, increased fascial restrictions, impaired flexibility, impaired UE functional use, postural dysfunction, and pain.    ACTIVITY LIMITATIONS cleaning, community activity, meal prep, occupation, laundry, and yard work.    PERSONAL FACTORS Fitness and Past/current experiences (hx of radiation) are also affecting patient's functional outcome.      REHAB POTENTIAL: Good   CLINICAL DECISION MAKING: Stable/uncomplicated   EVALUATION COMPLEXITY: Low   GOALS: Goals reviewed with patient? Yes   SHORT TERM GOALS:   Pt will attend ABC and be familiar with lymphedema risk reduction practices.  Baseline: unaware Target date: 07/09/2021 Goal status: INITIAL   LONG TERM GOALS:   Pt will demonstrate 165 degrees of L shoulder flexion to allow her to reach overhead.  Baseline: 103 Target date: 07/23/2021 Goal status: INITIAL   2.  Pt will demonstrate 165 degrees of L shoulder abduction to allow her to reach out to the side. Baseline: 92 Target date: 07/23/2021 Goal status: INITIAL   3.  Pt will report a 75% improvement in feelings of tightness and discomfort across L chest with overhead motions to allow improved comfort. Baseline: has pain and discomfort Target date: 07/23/2021 Goal status: INITIAL   4.  Pt will be independent in a home exercise program for continued stretching and strengthening. Baseline: N/A Target date:  07/23/2021 Goal status: INITIAL   5.  Pt will score less than a 15 on quick dash for improved UE function Baseline: 45.45 Target date: 07/23/2021 Goal status: INITIAL   PLAN: PT FREQUENCY: 2x/week   PT DURATION: 4 weeks   PLANNED INTERVENTIONS: Therapeutic exercises, Patient/Family education, Joint mobilization, Manual lymph drainage, scar mobilization, Taping, and Manual therapy   PLAN FOR NEXT SESSION: Cont pulleys, ball, gentle PROM to L shoulder, gentle STM to L chest and scar mobilization, give supine scap once pt regains ROM      [employer], PT 07/17/2021, 9:59 AM    PHYSICAL THERAPY DISCHARGE SUMMARY  Visits from Start of Care: 7  Current functional level related to goals / functional outcomes: See above   Remaining deficits: See above   Education / Equipment: HEP   Patient agrees to discharge. Patient goals were not met. Patient is being discharged due to not returning since the last visit.  Allyson Sabal [place], [place] 12/04/21 2:17 PM

## 2021-07-18 ENCOUNTER — Encounter: Payer: Self-pay | Admitting: Family Medicine

## 2021-07-18 ENCOUNTER — Other Ambulatory Visit: Payer: Self-pay | Admitting: Hematology and Oncology

## 2021-07-18 ENCOUNTER — Other Ambulatory Visit: Payer: Self-pay | Admitting: Family Medicine

## 2021-07-18 ENCOUNTER — Other Ambulatory Visit: Payer: Self-pay | Admitting: Adult Health

## 2021-07-18 DIAGNOSIS — Z170 Estrogen receptor positive status [ER+]: Secondary | ICD-10-CM

## 2021-07-19 ENCOUNTER — Other Ambulatory Visit: Payer: Self-pay | Admitting: *Deleted

## 2021-07-19 ENCOUNTER — Encounter: Payer: Self-pay | Admitting: Adult Health

## 2021-07-19 MED ORDER — ACCU-CHEK SOFTCLIX LANCETS MISC: 5 refills | Status: DC

## 2021-07-19 MED ORDER — BLOOD GLUCOSE MONITOR KIT: PACK | 0 refills | Status: DC

## 2021-07-19 MED ORDER — ACCU-CHEK GUIDE VI STRP: ORAL_STRIP | 3 refills | Status: DC

## 2021-07-21 ENCOUNTER — Encounter: Payer: Self-pay | Admitting: Adult Health

## 2021-07-21 ENCOUNTER — Other Ambulatory Visit: Payer: Self-pay | Admitting: Family Medicine

## 2021-07-21 MED ORDER — OMEPRAZOLE 20 MG PO CPDR: 20.0000 mg | DELAYED_RELEASE_CAPSULE | Freq: Every day | ORAL | 1 refills | Status: DC

## 2021-07-22 ENCOUNTER — Encounter: Payer: Medicaid Other | Admitting: Physical Therapy

## 2021-07-22 NOTE — Telephone Encounter (Signed)
Nicholas Lose, MD  You 2 hours ago (5:50 AM)  ? ?I am happy to manage her pain meds  ?Thanks  ?Vinay   ?=============== ?Duly noted and appreciated.  I will defer.  ?

## 2021-07-23 ENCOUNTER — Encounter: Payer: Self-pay | Admitting: Family Medicine

## 2021-07-23 ENCOUNTER — Ambulatory Visit (INDEPENDENT_AMBULATORY_CARE_PROVIDER_SITE_OTHER): Payer: Medicaid Other | Admitting: Family Medicine

## 2021-07-23 DIAGNOSIS — K219 Gastro-esophageal reflux disease without esophagitis: Secondary | ICD-10-CM

## 2021-07-23 DIAGNOSIS — F32A Depression, unspecified: Secondary | ICD-10-CM | POA: Diagnosis not present

## 2021-07-23 DIAGNOSIS — M79646 Pain in unspecified finger(s): Secondary | ICD-10-CM | POA: Diagnosis not present

## 2021-07-23 MED ORDER — VENLAFAXINE HCL 37.5 MG PO TABS: 37.5000 mg | ORAL_TABLET | Freq: Two times a day (BID) | ORAL | 1 refills | Status: DC

## 2021-07-23 MED ORDER — OMEPRAZOLE 20 MG PO CPDR: 20.0000 mg | DELAYED_RELEASE_CAPSULE | Freq: Every day | ORAL | 1 refills | Status: DC

## 2021-07-23 NOTE — Patient Instructions (Addendum)
Likely thumb tendonitis.  ?Try icing for 5 minutes at a time and use a thumb spica splint.  ? ?Start venlafaxine daily for 4 days then try to increase to twice a day.  Please update me in about 1 week, sooner if needed.  ? ?Take care.  Glad to see you. ? ?

## 2021-07-23 NOTE — Progress Notes (Signed)
Depression and anxiety.  Still smoking, wants to quit.  At home with her dog and cats.  Husband is still work during the day.  No SI/HI.  D/w pt about counseling vs meds vs both.  She was not interested in counseling. ? ?R wrist and thumb pain. Going on for about 1 month.  On extensor side of R thumb, not palmar side.  No trauma, no injury.  Pain with grip ? ?Hydrocodone per outside clinic.  Discussed. ? ?PPI was effective taking daily, rx prev sent but patient had trouble picking up.  Rx printed for patient, d/w pt.   ? ?Rare hot flashes on arimidex.  Discussed.  She can tolerate as is. ? ?Meds, vitals, and allergies reviewed.  ? ?ROS: Per HPI unless specifically indicated in ROS section  ? ?GEN: nad, alert and oriented ?HEENT: ncat ?NECK: supple w/o LA ?CV: rrr.  ?PULM: ctab, no inc wob ?ABD: soft, +bs ?EXT: no edema ?SKIN: Well-perfused ?Right hand with normal grip except for pain on range of motion at the right thumb.  Distally neurovascular intact and no tendon deficit but she has pain on testing of the extensor tendon of the right thumb, no pain on testing the palmar tendon.  She does not have any erythema or bruising.  Normal radial pulse. ?

## 2021-07-25 DIAGNOSIS — K219 Gastro-esophageal reflux disease without esophagitis: Secondary | ICD-10-CM | POA: Insufficient documentation

## 2021-07-25 DIAGNOSIS — M79646 Pain in unspecified finger(s): Secondary | ICD-10-CM | POA: Insufficient documentation

## 2021-07-25 NOTE — Assessment & Plan Note (Signed)
Likely extensor tendinitis and without trauma x-ray is likely to be unremarkable so we agreed to defer that for now.  Anatomy discussed with patient. ?I asked her to try icing for 5 minutes at a time and use a thumb spica splint.  She can update me as needed ? ?

## 2021-07-25 NOTE — Assessment & Plan Note (Signed)
Continue daily omeprazole.  Rx printed. ?

## 2021-07-25 NOTE — Assessment & Plan Note (Signed)
We can address smoking cessation has been going on but it likely makes sense to work on her mood first.  Discussed options, still okay for outpatient follow-up. ?Start venlafaxine daily for 4 days then try to increase to twice a day.  I asked her to update me in about 1 week, sooner if needed.  ?

## 2021-08-10 ENCOUNTER — Other Ambulatory Visit: Payer: Self-pay | Admitting: Hematology and Oncology

## 2021-08-12 MED ORDER — HYDROCODONE-ACETAMINOPHEN 5-325 MG PO TABS
1.0000 | ORAL_TABLET | Freq: Four times a day (QID) | ORAL | 0 refills | Status: DC | PRN
Start: 2021-08-12 — End: 2021-08-13

## 2021-08-13 ENCOUNTER — Other Ambulatory Visit: Payer: Self-pay | Admitting: Hematology and Oncology

## 2021-08-14 MED ORDER — HYDROCODONE-ACETAMINOPHEN 5-325 MG PO TABS: 1.0000 | ORAL_TABLET | Freq: Four times a day (QID) | ORAL | 0 refills | Status: DC | PRN

## 2021-08-14 NOTE — Telephone Encounter (Signed)
Can you please decline this- you have already refilled earlier this week.  ?

## 2021-08-15 ENCOUNTER — Other Ambulatory Visit: Payer: Self-pay | Admitting: Family Medicine

## 2021-08-26 ENCOUNTER — Telehealth: Payer: Self-pay | Admitting: Family Medicine

## 2021-08-26 DIAGNOSIS — M79673 Pain in unspecified foot: Secondary | ICD-10-CM

## 2021-08-26 NOTE — Telephone Encounter (Signed)
Patient would like to be referred to a podiatrist for ingrown toenails. She states it hurts when they covers or anything touches them.

## 2021-08-27 NOTE — Addendum Note (Signed)
Addended by: Tonia Ghent on: 08/27/2021 09:00 AM   Modules accepted: Orders

## 2021-08-27 NOTE — Telephone Encounter (Signed)
Referral placed  Thanks!

## 2021-08-29 ENCOUNTER — Encounter: Payer: Self-pay | Admitting: Adult Health

## 2021-09-05 ENCOUNTER — Other Ambulatory Visit: Payer: Self-pay | Admitting: Hematology and Oncology

## 2021-09-07 ENCOUNTER — Other Ambulatory Visit: Payer: Self-pay | Admitting: Hematology and Oncology

## 2021-09-09 ENCOUNTER — Other Ambulatory Visit: Payer: Self-pay | Admitting: Hematology and Oncology

## 2021-09-09 NOTE — Telephone Encounter (Signed)
VG has pt taking norco for ongoing pain post mastectomy in January.  Do you feel comfortable refilling?

## 2021-09-10 ENCOUNTER — Telehealth: Payer: Self-pay

## 2021-09-10 ENCOUNTER — Other Ambulatory Visit: Payer: Medicaid Other

## 2021-09-10 ENCOUNTER — Ambulatory Visit: Payer: Medicaid Other

## 2021-09-10 ENCOUNTER — Encounter: Payer: Self-pay | Admitting: Adult Health

## 2021-09-10 ENCOUNTER — Encounter: Payer: Medicaid Other | Admitting: Physician Assistant

## 2021-09-10 MED ORDER — HYDROCODONE-ACETAMINOPHEN 5-325 MG PO TABS: 1.0000 | ORAL_TABLET | Freq: Four times a day (QID) | ORAL | 0 refills | Status: DC | PRN

## 2021-09-10 NOTE — Telephone Encounter (Signed)
Pt called and LVM stating she needs refill on Norco. Pt states she has been trying to get it refilled since Thursday however there is no history of request. Called pt to advise her norco was called in to [facility] but she did not answer. LVM with details and to call with any further questions/concerns.

## 2021-09-10 NOTE — Telephone Encounter (Signed)
Patient is calling in stating that her oncologist is not refilling her HYDROcodone-acetaminophen (NORCO) 5-325 MG tablet. Wanting to know if Dr.Duncan will take it over.

## 2021-09-11 ENCOUNTER — Encounter: Payer: Self-pay | Admitting: Adult Health

## 2021-09-11 ENCOUNTER — Ambulatory Visit: Payer: Medicaid Other | Admitting: Podiatry

## 2021-09-11 NOTE — Telephone Encounter (Signed)
It does not look like we need to change this.  Her prescription was sent yesterday.

## 2021-09-12 NOTE — Telephone Encounter (Signed)
Can you please refuse this, I don't know why the pharmacy keeps sending it over.  Looks like you sent the refill yesterday.

## 2021-09-12 NOTE — Telephone Encounter (Signed)
Patient advised that medication was refilled by oncologist and that they left her a detailed message about this also. Patient verbalized understanding

## 2021-09-13 ENCOUNTER — Encounter: Payer: Self-pay | Admitting: Family

## 2021-09-13 ENCOUNTER — Encounter: Payer: Self-pay | Admitting: Adult Health

## 2021-09-13 ENCOUNTER — Ambulatory Visit (INDEPENDENT_AMBULATORY_CARE_PROVIDER_SITE_OTHER): Payer: Medicaid Other | Admitting: Family

## 2021-09-13 VITALS — BP 104/56 | HR 71 | Temp 98.1°F | Resp 16 | Ht 64.0 in | Wt 144.2 lb

## 2021-09-13 DIAGNOSIS — H60312 Diffuse otitis externa, left ear: Secondary | ICD-10-CM | POA: Insufficient documentation

## 2021-09-13 DIAGNOSIS — H6692 Otitis media, unspecified, left ear: Secondary | ICD-10-CM | POA: Diagnosis not present

## 2021-09-13 MED ORDER — AMOXICILLIN-POT CLAVULANATE 875-125 MG PO TABS: 1.0000 | ORAL_TABLET | Freq: Two times a day (BID) | ORAL | 0 refills | Status: DC

## 2021-09-13 MED ORDER — NEOMYCIN-POLYMYXIN-HC 1 % OT SOLN: 3.0000 [drp] | Freq: Four times a day (QID) | OTIC | 0 refills | Status: AC

## 2021-09-13 NOTE — Assessment & Plan Note (Signed)
rx otic solution

## 2021-09-13 NOTE — Progress Notes (Incomplete)
Established Patient Office Visit  Subjective:  Patient ID: Julia [name], female    DOB: 1965/07/11  Age: 56 y.o. MRN: [id number]  CC:  Chief Complaint  Patient presents with  . Ear Pain    Left ear pain X 1 week    HPI Julia [name] is here today with concerns.   Ear pain x one week ago. No sore throat.  No nasal congestion.  No fever no chills.  No cough or chest congestion.   Past Medical History:  Diagnosis Date  . Acute bronchitis 04/19/2021  . Anxiety    over cancer diagnosis  . Asthma    bronchial asthma   . Cancer ([place]) 09/2019   left breast invasive mammary cancer  . Chronic back pain   . COPD (chronic obstructive pulmonary disease) (H[facility])    smokes 1 1/2 ppd  . Diabetes mellitus without complication ([place])   . GERD (gastroesophageal reflux disease)   . Personal history of chemotherapy   . Personal history of radiation therapy   . Pneumonia    hx of 2018   . Smoking     Past Surgical History:  Procedure Laterality Date  . BREAST BIOPSY Left 09/2019   x2  . BREAST BIOPSY Left 11/22/2020  . BREAST LUMPECTOMY Left 10/2019  . BREAST LUMPECTOMY WITH SENTINEL LYMPH NODE BIOPSY Left 10/07/2019   Procedure: LEFT BREAST LUMPECTOMY WITH SENTINEL LYMPH NODE BX;  Surgeon: Jovita Kussmaul, MD;  Location: [facility];  Service: General;  Laterality: Left;  PEC BLOCK  . CHOLECYSTECTOMY    . PORTACATH PLACEMENT Right 11/10/2019   Procedure: INSERTION PORT-A-CATH;  Surgeon: Jovita Kussmaul, MD;  Location: [facility];  Service: General;  Laterality: Right;  . TONSILLECTOMY    . TOTAL MASTECTOMY Left 05/06/2021   Procedure: LEFT TOTAL MASTECTOMY;  Surgeon: Jovita Kussmaul, MD;  Location: [facility];  Service: General;  Laterality: Left;  [place] VULVECTOMY N/A 01/30/2015   Procedure: WIDE LOCAL EXCISION VULVA;  Surgeon: Everitt Amber, MD;  Location: [facility];  Service: Gynecology;  Laterality: N/A;    Family History  Problem Relation Age of Onset  . Hypertension Mother    . Diabetes Mother        insulin dependent  . Heart disease Mother   . Diabetes Father   . Heart disease Father        CAD  . Hypertension Brother   . Kidney disease Brother        kidney failure/resolved  . Colon cancer Maternal Aunt        dx'd at ~62  . Breast cancer Maternal Aunt 65    Social History   Socioeconomic History  . Marital status: Married    Spouse name: Not on file  . Number of children: 1  . Years of education: 48  . Highest education level: Not on file  Occupational History  . Occupation: Scientist, water quality at [employer]  . Smoking status: Every Day    Packs/day: 2.00    Years: 31.00    Total pack years: 62.00    Types: Cigarettes  . Smokeless tobacco: Never  Vaping Use  . Vaping Use: Never used  Substance and Sexual Activity  . Alcohol use: No    Alcohol/week: 0.0 standard drinks of alcohol  . Drug use: No  . Sexual activity: Yes    Birth control/protection: Post-menopausal  Other Topics Concern  . Not on file  Social History Narrative  One son   Married 2000 (had been together since 1984)   Social Determinants of Radio broadcast assistant Strain: Not on [employer] Insecurity: Not on file  Transportation Needs: Not on file  Physical Activity: Not on file  Stress: Not on file  Social Connections: Not on file  Intimate Partner Violence: Not on file    Outpatient Medications Prior to Visit  Medication Sig Dispense Refill  . Accu-Chek Softclix Lancets lancets Check sugar once daily. 100 each 5  . albuterol (PROVENTIL) (2.5 MG/3ML) 0.083% nebulizer solution Take 3 mLs (2.5 mg total) by nebulization every 6 (six) hours as needed for wheezing or shortness of breath. 75 mL 1  . anastrozole (ARIMIDEX) 1 MG tablet TAKE 1 TABLET BY MOUTH EVERY DAY 90 tablet 3  . blood glucose meter kit and supplies KIT Dispense based on patient and insurance preference. Use up to four times daily as directed. (FOR ICD-9 250.00, 250.01). 1 each 0  .  gabapentin (NEURONTIN) 300 MG capsule Take up to 4 tabs a day (2 tabs twice a day) 120 capsule 3  . glucose blood (ACCU-CHEK GUIDE) test strip USE TO TEST BLOOD SUGAR UP TO 4 TIMES A DAY AS DIRECTED 100 strip 3  . HYDROcodone-acetaminophen (NORCO) 5-325 MG tablet Take 1-2 tablets by mouth every 6 (six) hours as needed for severe pain. Sedation caution 60 tablet 0  . ibuprofen (ADVIL) 600 MG tablet TAKE 1 TABLET (600 MG TOTAL) BY MOUTH EVERY 8 (EIGHT) HOURS AS NEEDED (FOR PAIN. TAKE WITH FOOD.). 90 tablet 2  . metFORMIN (GLUCOPHAGE) 500 MG tablet TAKE 1 TABLET BY MOUTH ONCE DAILY WITH MEAL 90 tablet 1  . omeprazole (PRILOSEC) 20 MG capsule Take 1 capsule (20 mg total) by mouth daily. 90 capsule 1  . venlafaxine (EFFEXOR) 37.5 MG tablet TAKE 1 TABLET BY MOUTH 2 TIMES DAILY. 180 tablet 1  . VENTOLIN HFA 108 (90 Base) MCG/ACT inhaler INHALE 2 PUFFS INTO THE LUNGS EVERY 6 HOURS AS NEEDED FOR WHEEZING OR SHORTNESS OF BREATH 18 each 2   No facility-administered medications prior to visit.    Allergies  Allergen Reactions  . Propoxyphene N-Acetaminophen Hives  . Nabumetone Rash    She can tolerate ibuprofen w/o difficulty  . Rofecoxib Rash    Nightmares (vioxx)        Objective:    Physical Exam  BP (!) 104/56   Pulse 71   Temp 98.1 F (36.7 C)   Resp 16   Ht _0  (1.626 m)   Wt 144 lb 3 oz (65.4 kg)   SpO2 96%   BMI 24.75 kg/m  Wt Readings from Last 3 Encounters:  09/13/21 144 lb 3 oz (65.4 kg)  07/23/21 143 lb (64.9 kg)  05/16/21 145 lb 6.4 oz (66 kg)     Health Maintenance Due  Topic Date Due  . COVID-19 Vaccine (1) Never done  . OPHTHALMOLOGY EXAM  Never done  . URINE MICROALBUMIN  Never done  . Hepatitis C Screening  Never done  . COLONOSCOPY (Pts 45-39yr Insurance coverage will need to be confirmed)  Never done  . PAP SMEAR-Modifier  05/06/2014  . TETANUS/TDAP  06/24/2018  . FOOT EXAM  01/29/2021    There are no preventive care reminders to display for this  patient.  Lab Results  Component Value Date   TSH 0.65 04/13/2017   Lab Results  Component Value Date   WBC 8.8 05/16/2021   HGB 14.4 05/16/2021  HCT 41.1 05/16/2021   MCV 92.6 05/16/2021   PLT 225 05/16/2021   Lab Results  Component Value Date   NA 137 05/16/2021   K 3.9 05/16/2021   CO2 26 05/16/2021   GLUCOSE 153 (H) 05/16/2021   BUN 11 05/16/2021   CREATININE 0.60 05/16/2021   BILITOT 0.4 05/16/2021   ALKPHOS 68 05/16/2021   AST 18 05/16/2021   ALT 19 05/16/2021   PROT 6.8 05/16/2021   ALBUMIN 3.9 05/16/2021   CALCIUM 9.3 05/16/2021   ANIONGAP 6 05/16/2021   GFR 96.34 07/18/2019   Lab Results  Component Value Date   HGBA1C 6.2 (A) 04/29/2021      Assessment & Plan:   Problem List Items Addressed This Visit   None   No orders of the defined types were placed in this encounter.   Follow-up: No follow-ups on file.    Eugenia Pancoast, FNP

## 2021-09-13 NOTE — Assessment & Plan Note (Signed)
rx augmentin 875/125 mg po bid x 10 days Take antibiotic as prescribed. Increase oral fluids. Pt to f/u if sx worsen and or fail to improve in 2-3 days.  

## 2021-09-13 NOTE — Progress Notes (Unsigned)
Established Patient Office Visit  Subjective:  Patient ID: Julia [name], female    DOB: 1965-10-19  Age: 56 y.o. MRN: [id number]  CC:  Chief Complaint  Patient presents with   Ear Pain    Left ear pain X 1 week    HPI Julia [name] is here today with concerns.   Ear pain x one week ago. No sore throat.  No nasal congestion.  No fever no chills.  No cough or chest congestion  Past Medical History:  Diagnosis Date   Acute bronchitis 04/19/2021   Anxiety    over cancer diagnosis   Asthma    bronchial asthma    Cancer ([place]) 09/2019   left breast invasive mammary cancer   Chronic back pain    COPD (chronic obstructive pulmonary disease) ([place])    smokes 1 1/2 ppd   Diabetes mellitus without complication (HCC)    GERD (gastroesophageal reflux disease)    Personal history of chemotherapy    Personal history of radiation therapy    Pneumonia    hx of 2018    Smoking     Past Surgical History:  Procedure Laterality Date   BREAST BIOPSY Left 09/2019   x2   BREAST BIOPSY Left 11/22/2020   BREAST LUMPECTOMY Left 10/2019   BREAST LUMPECTOMY WITH SENTINEL LYMPH NODE BIOPSY Left 10/07/2019   Procedure: LEFT BREAST LUMPECTOMY WITH SENTINEL LYMPH NODE BX;  Surgeon: Jovita Kussmaul, MD;  Location: [facility];  Service: General;  Laterality: Left;  [place] Right 11/10/2019   Procedure: INSERTION PORT-A-CATH;  Surgeon: Jovita Kussmaul, MD;  Location[facility];  Service: General;  Laterality: Right;   TONSILLECTOMY     TOTAL MASTECTOMY Left 05/06/2021   Procedure: LEFT TOTAL MASTECTOMY;  Surgeon: Jovita Kussmaul, MD;  Location: [facility];  Service: General;  Laterality: Left;   VULVECTOMY N/A 01/30/2015   Procedure: WIDE LOCAL EXCISION VULVA;  Surgeon: Everitt Amber, MD;  [facility];  Service: Gynecology;  Laterality: N/A;    Family History  Problem Relation Age of Onset   Hypertension Mother    Diabetes Mother         insulin dependent   Heart disease Mother    Diabetes Father    Heart disease Father        CAD   Hypertension Brother    Kidney disease Brother        kidney failure/resolved   Colon cancer Maternal Aunt        dx'd at ~78   Breast cancer Maternal Aunt 65    Social History   Socioeconomic History   Marital status: Married    Spouse name: Not on file   Number of children: 1   Years of education: 12   Highest education level: Not on file  Occupational History   Occupation: Scientist, 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   Smoking status: Every Day    Packs/day: 2.00    Years: 31.00    Total pack years: 62.00    Types: Cigarettes   Smokeless tobacco: Never  Vaping Use   Vaping Use: Never used  Substance and Sexual Activity   Alcohol use: No    Alcohol/week: 0.0 standard drinks of alcohol   Drug use: No   Sexual activity: Yes    Birth control/protection: Post-menopausal  Other Topics Concern   Not on file  Social History Narrative  One son   Married 2000 (had been together since 1984)   Social Determinants of Radio broadcast assistant Strain: Not on [employer] Insecurity: Not on file  Transportation Needs: Not on file  Physical Activity: Not on file  Stress: Not on file  Social Connections: Not on file  Intimate Partner Violence: Not on file    Outpatient Medications Prior to Visit  Medication Sig Dispense Refill   Accu-Chek Softclix Lancets lancets Check sugar once daily. 100 each 5   albuterol (PROVENTIL) (2.5 MG/3ML) 0.083% nebulizer solution Take 3 mLs (2.5 mg total) by nebulization every 6 (six) hours as needed for wheezing or shortness of breath. 75 mL 1   anastrozole (ARIMIDEX) 1 MG tablet TAKE 1 TABLET BY MOUTH EVERY DAY 90 tablet 3   blood glucose meter kit and supplies KIT Dispense based on patient and insurance preference. Use up to four times daily as directed. (FOR ICD-9 250.00, 250.01). 1 each 0   gabapentin (NEURONTIN) 300 MG capsule Take up to 4  tabs a day (2 tabs twice a day) 120 capsule 3   glucose blood (ACCU-CHEK GUIDE) test strip USE TO TEST BLOOD SUGAR UP TO 4 TIMES A DAY AS DIRECTED 100 strip 3   HYDROcodone-acetaminophen (NORCO) 5-325 MG tablet Take 1-2 tablets by mouth every 6 (six) hours as needed for severe pain. Sedation caution 60 tablet 0   ibuprofen (ADVIL) 600 MG tablet TAKE 1 TABLET (600 MG TOTAL) BY MOUTH EVERY 8 (EIGHT) HOURS AS NEEDED (FOR PAIN. TAKE WITH FOOD.). 90 tablet 2   metFORMIN (GLUCOPHAGE) 500 MG tablet TAKE 1 TABLET BY MOUTH ONCE DAILY WITH MEAL 90 tablet 1   omeprazole (PRILOSEC) 20 MG capsule Take 1 capsule (20 mg total) by mouth daily. 90 capsule 1   venlafaxine (EFFEXOR) 37.5 MG tablet TAKE 1 TABLET BY MOUTH 2 TIMES DAILY. 180 tablet 1   VENTOLIN HFA 108 (90 Base) MCG/ACT inhaler INHALE 2 PUFFS INTO THE LUNGS EVERY 6 HOURS AS NEEDED FOR WHEEZING OR SHORTNESS OF BREATH 18 each 2   No facility-administered medications prior to visit.    Allergies  Allergen Reactions   Propoxyphene N-Acetaminophen Hives   Nabumetone Rash    She can tolerate ibuprofen w/o difficulty   Rofecoxib Rash    Nightmares (vioxx)        Objective:    Physical Exam Vitals reviewed.  Constitutional:      General: She is not in acute distress.    Appearance: Normal appearance. She is normal weight. She is not ill-appearing.  HENT:     Right Ear: Hearing, tympanic membrane, ear canal and external ear normal.     Left Ear: No decreased hearing noted. Drainage (dried blood) and tenderness (external ear very tender) present. No laceration or swelling. A middle ear effusion is present. There is no impacted cerumen. Tympanic membrane is erythematous and retracted.     Nose: Nose normal. No congestion or rhinorrhea.     Right Turbinates: Not enlarged or swollen.     Left Turbinates: Not enlarged or swollen.     Right Sinus: No maxillary sinus tenderness or frontal sinus tenderness.     Left Sinus: No maxillary sinus  tenderness or frontal sinus tenderness.     Mouth/Throat:     Mouth: Mucous membranes are moist.     Pharynx: No pharyngeal swelling, oropharyngeal exudate or posterior oropharyngeal erythema.     Tonsils: No tonsillar exudate.  Eyes:     Extraocular  Movements: Extraocular movements intact.     Conjunctiva/sclera: Conjunctivae normal.     Pupils: Pupils are equal, round, and reactive to light.  Neck:     Thyroid: No thyroid mass.  Cardiovascular:     Rate and Rhythm: Normal rate and regular rhythm.  Pulmonary:     Effort: Pulmonary effort is normal.     Breath sounds: Normal breath sounds.  Lymphadenopathy:     Cervical:     Right cervical: No superficial cervical adenopathy.    Left cervical: No superficial cervical adenopathy.  Neurological:     Mental Status: She is alert.     BP (!) 104/56   Pulse 71   Temp 98.1 F (36.7 C)   Resp 16   Ht '5\' 4"'  (1.626 m)   Wt 144 lb 3 oz (65.4 kg)   SpO2 96%   BMI 24.75 kg/m  Wt Readings from Last 3 Encounters:  09/13/21 144 lb 3 oz (65.4 kg)  07/23/21 143 lb (64.9 kg)  05/16/21 145 lb 6.4 oz (66 kg)     Health Maintenance Due  Topic Date Due   COVID-19 Vaccine (1) Never done   OPHTHALMOLOGY EXAM  Never done   URINE MICROALBUMIN  Never done   Hepatitis C Screening  Never done   COLONOSCOPY (Pts 45-73yr Insurance coverage will need to be confirmed)  Never done   PAP SMEAR-Modifier  05/06/2014   TETANUS/TDAP  06/24/2018   FOOT EXAM  01/29/2021    There are no preventive care reminders to display for this patient.  Lab Results  Component Value Date   TSH 0.65 04/13/2017   Lab Results  Component Value Date   WBC 8.8 05/16/2021   HGB 14.4 05/16/2021   HCT 41.1 05/16/2021   MCV 92.6 05/16/2021   PLT 225 05/16/2021   Lab Results  Component Value Date   NA 137 05/16/2021   K 3.9 05/16/2021   CO2 26 05/16/2021   GLUCOSE 153 (H) 05/16/2021   BUN 11 05/16/2021   CREATININE 0.60 05/16/2021   BILITOT 0.4 05/16/2021    ALKPHOS 68 05/16/2021   AST 18 05/16/2021   ALT 19 05/16/2021   PROT 6.8 05/16/2021   ALBUMIN 3.9 05/16/2021   CALCIUM 9.3 05/16/2021   ANIONGAP 6 05/16/2021   GFR 96.34 07/18/2019   Lab Results  Component Value Date   HGBA1C 6.2 (A) 04/29/2021      Assessment & Plan:   Problem List Items Addressed This Visit       Nervous and Auditory   Acute diffuse otitis externa of left ear    rx otic solution        Relevant Medications   amoxicillin-clavulanate (AUGMENTIN) 875-125 MG tablet   NEOMYCIN-POLYMYXIN-HYDROCORTISONE (CORTISPORIN) 1 % SOLN OTIC solution   Left acute otitis media - Primary    rx augmentin 875/125 mg po bid x 10 days Take antibiotic as prescribed. Increase oral fluids. Pt to f/u if sx worsen and or fail to improve in 2-3 days.       Relevant Medications   amoxicillin-clavulanate (AUGMENTIN) 875-125 MG tablet   NEOMYCIN-POLYMYXIN-HYDROCORTISONE (CORTISPORIN) 1 % SOLN OTIC solution    Meds ordered this encounter  Medications   amoxicillin-clavulanate (AUGMENTIN) 875-125 MG tablet    Sig: Take 1 tablet by mouth 2 (two) times daily.    Dispense:  20 tablet    Refill:  0    Order Specific Question:   Supervising Provider    Answer:   BDiona Browner  AMY E [[id number]]   NEOMYCIN-POLYMYXIN-HYDROCORTISONE (CORTISPORIN) 1 % SOLN OTIC solution    Sig: Place 3 drops into the left ear 4 (four) times daily for 7 days.    Dispense:  4.2 mL    Refill:  0    Order Specific Question:   Supervising Provider    Answer:   BEDSOLE, AMY E [[id number]]    Follow-up: Return if symptoms worsen or fail to improve with pcp.    Eugenia Pancoast, FNP

## 2021-09-16 NOTE — Patient Instructions (Signed)
Antibiotic sent to preferred pharmacy.   Please increase oral fluids, steamy hot shower/humidifier prn.  Please follow up if no improvement in 2-3 days.   It was a pleasure seeing you today! Please do not hesitate to reach out with any questions and or concerns.  Regards,   Ashelynn Marks   

## 2021-09-23 DIAGNOSIS — Z170 Estrogen receptor positive status [ER+]: Secondary | ICD-10-CM | POA: Diagnosis not present

## 2021-09-23 DIAGNOSIS — Z9012 Acquired absence of left breast and nipple: Secondary | ICD-10-CM | POA: Diagnosis not present

## 2021-09-23 DIAGNOSIS — C50412 Malignant neoplasm of upper-outer quadrant of left female breast: Secondary | ICD-10-CM | POA: Diagnosis not present

## 2021-09-26 ENCOUNTER — Other Ambulatory Visit: Payer: Self-pay | Admitting: Student

## 2021-09-26 DIAGNOSIS — Z9012 Acquired absence of left breast and nipple: Secondary | ICD-10-CM

## 2021-10-02 ENCOUNTER — Other Ambulatory Visit: Payer: Self-pay | Admitting: Adult Health

## 2021-10-03 ENCOUNTER — Other Ambulatory Visit: Payer: Self-pay | Admitting: Hematology and Oncology

## 2021-10-03 ENCOUNTER — Encounter: Payer: Self-pay | Admitting: Adult Health

## 2021-10-06 ENCOUNTER — Other Ambulatory Visit: Payer: Self-pay | Admitting: Adult Health

## 2021-10-07 ENCOUNTER — Encounter: Payer: Self-pay | Admitting: Adult Health

## 2021-10-09 MED ORDER — HYDROCODONE-ACETAMINOPHEN 5-325 MG PO TABS: 1.0000 | ORAL_TABLET | Freq: Four times a day (QID) | ORAL | 0 refills | Status: DC | PRN

## 2021-10-11 ENCOUNTER — Telehealth: Payer: Self-pay | Admitting: Hematology and Oncology

## 2021-10-11 NOTE — Telephone Encounter (Signed)
.  Called patient to schedule appointment per 7/5 inbasket, patient is aware of date and time.   

## 2021-10-14 ENCOUNTER — Ambulatory Visit
Admission: RE | Admit: 2021-10-14 | Discharge: 2021-10-14 | Disposition: A | Discharge status: Home / Self Care | Payer: Medicaid Other | Source: Ambulatory Visit | Attending: Student | Admitting: Student

## 2021-10-14 ENCOUNTER — Other Ambulatory Visit: Payer: Self-pay | Admitting: Student

## 2021-10-14 DIAGNOSIS — Z9012 Acquired absence of left breast and nipple: Secondary | ICD-10-CM

## 2021-10-14 DIAGNOSIS — R928 Other abnormal and inconclusive findings on diagnostic imaging of breast: Secondary | ICD-10-CM | POA: Diagnosis not present

## 2021-10-14 DIAGNOSIS — M79622 Pain in left upper arm: Secondary | ICD-10-CM

## 2021-10-14 DIAGNOSIS — N6489 Other specified disorders of breast: Secondary | ICD-10-CM | POA: Diagnosis not present

## 2021-10-22 ENCOUNTER — Other Ambulatory Visit: Payer: Self-pay

## 2021-10-22 ENCOUNTER — Telehealth: Payer: Self-pay | Admitting: *Deleted

## 2021-10-22 ENCOUNTER — Encounter: Payer: Self-pay | Admitting: *Deleted

## 2021-10-22 ENCOUNTER — Inpatient Hospital Stay: Payer: Medicaid Other | Attending: Hematology and Oncology | Admitting: Hematology and Oncology

## 2021-10-22 ENCOUNTER — Inpatient Hospital Stay: Payer: Medicaid Other

## 2021-10-22 DIAGNOSIS — Z853 Personal history of malignant neoplasm of breast: Secondary | ICD-10-CM | POA: Insufficient documentation

## 2021-10-22 DIAGNOSIS — Z79811 Long term (current) use of aromatase inhibitors: Secondary | ICD-10-CM | POA: Insufficient documentation

## 2021-10-22 DIAGNOSIS — C50412 Malignant neoplasm of upper-outer quadrant of left female breast: Secondary | ICD-10-CM

## 2021-10-22 DIAGNOSIS — Z170 Estrogen receptor positive status [ER+]: Secondary | ICD-10-CM | POA: Diagnosis not present

## 2021-10-22 DIAGNOSIS — M810 Age-related osteoporosis without current pathological fracture: Secondary | ICD-10-CM | POA: Diagnosis not present

## 2021-10-22 DIAGNOSIS — R0789 Other chest pain: Secondary | ICD-10-CM | POA: Insufficient documentation

## 2021-10-22 DIAGNOSIS — Z9012 Acquired absence of left breast and nipple: Secondary | ICD-10-CM | POA: Diagnosis not present

## 2021-10-22 LAB — CBC WITH DIFFERENTIAL (CANCER CENTER ONLY)
Abs Immature Granulocytes: 0.02 10*3/uL (ref 0.00–0.07)
Basophils Absolute: 0 10*3/uL (ref 0.0–0.1)
Basophils Relative: 0 %
Eosinophils Absolute: 0.2 10*3/uL (ref 0.0–0.5)
Eosinophils Relative: 3 %
HCT: 43.4 % (ref 36.0–46.0)
Hemoglobin: 15.3 g/dL — ABNORMAL HIGH (ref 12.0–15.0)
Immature Granulocytes: 0 %
Lymphocytes Relative: 24 %
Lymphs Abs: 2.2 10*3/uL (ref 0.7–4.0)
MCH: 32.8 pg (ref 26.0–34.0)
MCHC: 35.3 g/dL (ref 30.0–36.0)
MCV: 92.9 fL (ref 80.0–100.0)
Monocytes Absolute: 0.6 10*3/uL (ref 0.1–1.0)
Monocytes Relative: 6 %
Neutro Abs: 6.3 10*3/uL (ref 1.7–7.7)
Neutrophils Relative %: 67 %
Platelet Count: 249 10*3/uL (ref 150–400)
RBC: 4.67 MIL/uL (ref 3.87–5.11)
RDW: 12.5 % (ref 11.5–15.5)
WBC Count: 9.3 10*3/uL (ref 4.0–10.5)
nRBC: 0 % (ref 0.0–0.2)

## 2021-10-22 LAB — CMP (CANCER CENTER ONLY)
ALT: 13 U/L (ref 0–44)
AST: 14 U/L — ABNORMAL LOW (ref 15–41)
Albumin: 4.5 g/dL (ref 3.5–5.0)
Alkaline Phosphatase: 68 U/L (ref 38–126)
Anion gap: 5 (ref 5–15)
BUN: 9 mg/dL (ref 6–20)
CO2: 25 mmol/L (ref 22–32)
Calcium: 10 mg/dL (ref 8.9–10.3)
Chloride: 108 mmol/L (ref 98–111)
Creatinine: 0.57 mg/dL (ref 0.44–1.00)
GFR, Estimated: >60 mL/min (ref >60)
Glucose, Bld: 110 mg/dL — ABNORMAL HIGH (ref 70–99)
Potassium: 4.3 mmol/L (ref 3.5–5.1)
Sodium: 138 mmol/L (ref 135–145)
Total Bilirubin: 0.6 mg/dL (ref 0.3–1.2)
Total Protein: 7.6 g/dL (ref 6.5–8.1)

## 2021-10-22 MED ORDER — GABAPENTIN 300 MG PO CAPS: 600.0000 mg | ORAL_CAPSULE | Freq: Three times a day (TID) | ORAL | 3 refills | Status: DC

## 2021-10-22 NOTE — Assessment & Plan Note (Addendum)
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 T2N0 stage Ib Oncotype DX score 32: 20% risk of distant recurrence in 9 years  Treatment plan:  1. Adjuvant chemotherapy with dose dense Adriamycin and Cytoxan x4 followed by Taxol weekly x12completed 03/27/21 2. followed by radiation therapystarted 04/26/20 3. Followed by adjuvant antiestrogen therapy.  Started February 2022 ------------------------------------------------------------------------------------------------------------------------------------------------- Anastrozole toxicities: She is tolerating it extremely well. Osteoporosis: Prolia every 6 months beginning 8/1/202  Left breast pain: Left mastectomy 04/29/2021: Benign  Severe left chest wall pain: In spite of taking pain medications and gabapentin. I will request acupuncture We will increase the gabapentin dose to 600 p.o. 3 times daily If that does not work then we will have to refer her to a pain doctor for injecting the nerve.  Breast cancer surveillance: Right breast mammogram 10/14/2021: Benign, breast density category B Osteoporosis: Every 6 months Prolia injections Return to clinic in 1 year for follow-up

## 2021-10-22 NOTE — Progress Notes (Signed)
Patient Care Team: Tonia Ghent, MD as PCP - General (Family Medicine) [name], RN as Registered Nurse [name], RN as Registered Nurse [name], RN as Oncology Nurse Navigator [name], RN as Oncology Nurse Navigator Kyung Rudd, MD as Consulting Physician (Radiation Oncology) Nicholas Lose, MD as Consulting Physician (Hematology and Oncology) Jovita Kussmaul, MD as Consulting Physician (General Surgery)  DIAGNOSIS:  Encounter Diagnosis  Name Primary?   Malignant neoplasm of upper-outer quadrant of left breast in female, estrogen receptor positive ([place])     SUMMARY OF ONCOLOGIC HISTORY: Oncology History  Malignant neoplasm of upper-outer quadrant of left breast in female, estrogen receptor positive ([place])  08/25/2019 Initial Diagnosis   Palpable left breast mass x1 month. Diagnostic mammogram and [place] on 08/18/19 showed a 2.8cm mass at the 2 o'clock position, and one lymph node with mild cortical thickening in the left axilla. Biopsy on 08/25/19 showed invasive mammary carcinoma in the breast, grade 3, HER-2 equivocal by IHC (2+) negative by FISH, ER+ >90%, PR+ >90%, left axilla negative   10/07/2019 Surgery   Left lumpectomy and sentinel lymph node biopsy [name]) [phone]): Grade 3 IDC with DCIS, 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   10/18/2019 Cancer Staging   Staging form: Breast, AJCC 8th Edition - Pathologic stage from 10/18/2019: Stage IB (pT2, pN0(sn), cM0, G3, ER+, PR+, HER2-)   10/21/2019 Oncotype testing   The Oncotype DX score was 32 predicting a risk of outside the breast recurrence over the next 9 years of 20% if the patient's only systemic therapy is tamoxifen for 5 years.    11/15/2019 - 03/27/2020 Chemotherapy   Adjuvant chemotherapy with dose dense Adriamycin and Cytoxan x4 (11/15/2019 - 12/27/2019) followed by Taxol weekly x12 (01/09/2020 - 03/27/20).   04/25/2020 - 05/25/2020 Radiation Therapy   The patient  initially received a dose of 42.56 Gy in 16 fractions to the breast using whole-breast tangent fields. This was delivered using a 3-D conformal technique. The pt received a boost delivering an additional 8 Gy in 4 fractions using a electron boost with 19mV electrons. The total dose was 50.56 Gy.   05/2020 -  Anti-estrogen oral therapy   Anastrozole   05/06/2021 Surgery   Left mastectomy: Benign (performed because of left breast fibrosis and pain)     CHIEF COMPLIANT: Follow-up on Anastrozole, complaining of severe left chest wall pain  INTERVAL HISTORY: Julia BARLOWEis a 56y.o. with above-mentioned history of left breast cancer treated with lumpectomy, adjuvant chemotherapy, radiation, and is currently on antiestrogen therapy with anastrozole. She presents to the clinic today for follow-up. She complains of pain in the breast and radiates. If she sneeze or cough it hurts. She takes hydrocodone and gabapentin for relief but it doesn't help. She does get some relief when she goes to bed.  ALLERGIES:  is allergic to propoxyphene n-acetaminophen, nabumetone, and rofecoxib.  MEDICATIONS:  Current Outpatient Medications  Medication Sig Dispense Refill   Accu-Chek Softclix Lancets lancets Check sugar once daily. 100 each 5   albuterol (PROVENTIL) (2.5 MG/3ML) 0.083% nebulizer solution Take 3 mLs (2.5 mg total) by nebulization every 6 (six) hours as needed for wheezing or shortness of breath. 75 mL 1   anastrozole (ARIMIDEX) 1 MG tablet TAKE 1 TABLET BY MOUTH EVERY DAY 90 tablet 3   blood glucose meter kit and supplies KIT Dispense based on patient and insurance preference. Use up to four times daily as directed. (  FOR ICD-9 250.00, 250.01). 1 each 0   gabapentin (NEURONTIN) 300 MG capsule Take 2 capsules (600 mg total) by mouth 3 (three) times daily. Take up to 4 tabs a day (2 tabs twice a day) 180 capsule 3   glucose blood (ACCU-CHEK GUIDE) test strip USE TO TEST BLOOD SUGAR UP TO 4 TIMES A DAY  AS DIRECTED 100 strip 3   HYDROcodone-acetaminophen (NORCO) 5-325 MG tablet Take 1-2 tablets by mouth every 6 (six) hours as needed for severe pain. Sedation caution 60 tablet 0   ibuprofen (ADVIL) 600 MG tablet TAKE 1 TABLET (600 MG TOTAL) BY MOUTH EVERY 8 (EIGHT) HOURS AS NEEDED (FOR PAIN. TAKE WITH FOOD.). 90 tablet 2   metFORMIN (GLUCOPHAGE) 500 MG tablet TAKE 1 TABLET BY MOUTH ONCE DAILY WITH MEAL 90 tablet 1   omeprazole (PRILOSEC) 20 MG capsule Take 1 capsule (20 mg total) by mouth daily. 90 capsule 1   VENTOLIN HFA 108 (90 Base) MCG/ACT inhaler INHALE 2 PUFFS INTO THE LUNGS EVERY 6 HOURS AS NEEDED FOR WHEEZING OR SHORTNESS OF BREATH 18 each 2   No current facility-administered medications for this visit.    PHYSICAL EXAMINATION: ECOG PERFORMANCE STATUS: 1 - Symptomatic but completely ambulatory  Vitals:   10/22/21 0923  BP: (!) 148/76  Pulse: 86  Resp: 18  Temp: (!) 97.3 F (36.3 C)  SpO2: 98%   Filed Weights   10/22/21 0923  Weight: 143 lb 4.8 oz (65 kg)    BREAST: Severe pain in the left chest wall with the scar tissue no palpable lumps or nodules.  (exam performed in the presence of a chaperone)  LABORATORY DATA:  I have reviewed the data as listed    Latest Ref Rng & Units 05/16/2021   10:27 AM 04/25/2021    3:30 PM 11/05/2020    1:25 PM  CMP  Glucose 70 - 99 mg/dL 153  93  145   BUN 6 - 20 mg/dL '11  8  8   ' Creatinine 0.44 - 1.00 mg/dL 0.60  0.71  0.66   Sodium 135 - 145 mmol/L 137  135  142   Potassium 3.5 - 5.1 mmol/L 3.9  4.1  3.8   Chloride 98 - 111 mmol/L 105  102  108   CO2 22 - 32 mmol/L '26  23  27   ' Calcium 8.9 - 10.3 mg/dL 9.3  9.3  9.5   Total Protein 6.5 - 8.1 g/dL 6.8   6.3   Total Bilirubin 0.3 - 1.2 mg/dL 0.4   0.5   Alkaline Phos 38 - 126 U/L 68   95   AST 15 - 41 U/L 18   21   ALT 0 - 44 U/L 19   27     Lab Results  Component Value Date   WBC 8.8 05/16/2021   HGB 14.4 05/16/2021   HCT 41.1 05/16/2021   MCV 92.6 05/16/2021   PLT 225  05/16/2021   NEUTROABS 6.0 05/16/2021    ASSESSMENT & PLAN:  Malignant neoplasm of upper-outer quadrant of left breast in female, estrogen receptor positive ([place]) 10/07/2019:Left lumpectomy and sentinel lymph node biopsy [name]): IDC with DCIS, 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  ER 90%, PR 90%, HER-2 negative by FISH Posterior margin positive but no further surgery done because it is muscle on the back. T2N0 stage Ib Oncotype DX score 32: 20% risk of distant recurrence in 9 years   Treatment plan:  1.  Adjuvant chemotherapy with dose dense Adriamycin and Cytoxan x4 followed by Taxol weekly x12 completed 03/27/21 2. followed by radiation therapy started 04/26/20 3.  Followed by adjuvant antiestrogen therapy.  Started February 2022 ------------------------------------------------------------------------------------------------------------------------------------------------- Anastrozole toxicities: She is tolerating it extremely well. Osteoporosis: Prolia every 6 months beginning 8/1/202   Left breast pain: Left mastectomy 04/29/2021: Benign  Severe left chest wall pain: In spite of taking pain medications and gabapentin. I will request acupuncture We will increase the gabapentin dose to 600 p.o. 3 times daily If that does not work then we will have to refer her to a pain doctor for injecting the nerve.  Breast cancer surveillance: Right breast mammogram 10/14/2021: Benign, breast density category B Osteoporosis: Every 6 months Prolia injections   Telephone visit in 6 weeks to assess how she is doing with the pain after acupuncture.   Orders Placed This Encounter  Procedures   CMP ([facility] only)    Standing Status:   Future    Standing Expiration Date:   10/23/2022   CBC with Differential (Cancer Center Only)    Standing Status:   Future    Standing Expiration Date:   10/23/2022   The patient has a good understanding of the overall plan. she agrees with it. she  will call with any problems that may develop before the next visit here. Total time spent: 30 mins including face to face time and time spent for planning, charting and co-ordination of care   Harriette Ohara, MD 10/22/21    I Gardiner Coins am scribing for Dr. Lindi Adie  I have reviewed the above documentation for accuracy and completeness, and I agree with the above.

## 2021-10-22 NOTE — Telephone Encounter (Signed)
Received referral from medical oncologist for acupuncture services to address pain. CSW called patient and left voicemail to return call.  Maryjean Morn, MSW, LCSW, OSW-C Clinical Social Worker [facility] [phone]

## 2021-10-23 ENCOUNTER — Inpatient Hospital Stay: Payer: Medicaid Other

## 2021-10-23 ENCOUNTER — Other Ambulatory Visit: Payer: Self-pay | Admitting: Hematology and Oncology

## 2021-10-23 ENCOUNTER — Telehealth: Payer: Self-pay | Admitting: Hematology and Oncology

## 2021-10-23 VITALS — BP 146/73 | HR 83 | Temp 98.6°F | Resp 18

## 2021-10-23 DIAGNOSIS — Z79811 Long term (current) use of aromatase inhibitors: Secondary | ICD-10-CM | POA: Diagnosis not present

## 2021-10-23 DIAGNOSIS — M810 Age-related osteoporosis without current pathological fracture: Secondary | ICD-10-CM | POA: Diagnosis not present

## 2021-10-23 DIAGNOSIS — Z853 Personal history of malignant neoplasm of breast: Secondary | ICD-10-CM | POA: Diagnosis not present

## 2021-10-23 DIAGNOSIS — R0789 Other chest pain: Secondary | ICD-10-CM | POA: Diagnosis not present

## 2021-10-23 DIAGNOSIS — M816 Localized osteoporosis [Lequesne]: Secondary | ICD-10-CM

## 2021-10-23 DIAGNOSIS — Z9012 Acquired absence of left breast and nipple: Secondary | ICD-10-CM | POA: Diagnosis not present

## 2021-10-23 DIAGNOSIS — Z95828 Presence of other vascular implants and grafts: Secondary | ICD-10-CM

## 2021-10-23 MED ORDER — DENOSUMAB 60 MG/ML ~~LOC~~ SOSY
60.0000 mg | PREFILLED_SYRINGE | Freq: Once | SUBCUTANEOUS | Status: AC
  Administered 2021-10-23: 60 mg via SUBCUTANEOUS
  Filled 2021-10-23: qty 1

## 2021-10-23 NOTE — Telephone Encounter (Signed)
Scheduled appointment per 7/18 los. Patient is aware.

## 2021-10-31 ENCOUNTER — Other Ambulatory Visit: Payer: Self-pay | Admitting: Hematology and Oncology

## 2021-11-01 ENCOUNTER — Telehealth: Payer: Self-pay

## 2021-11-01 MED ORDER — HYDROCODONE-ACETAMINOPHEN 5-325 MG PO TABS: 1.0000 | ORAL_TABLET | Freq: Four times a day (QID) | ORAL | 0 refills | Status: DC | PRN

## 2021-11-01 NOTE — Telephone Encounter (Signed)
Notified Patient by voicemail of prior authorization approval for Hydrocodone 5/325 mg Tablets.Medication is approved through 04/30/2022.

## 2021-11-05 ENCOUNTER — Other Ambulatory Visit: Payer: Self-pay | Admitting: Family Medicine

## 2021-11-06 ENCOUNTER — Encounter: Payer: Self-pay | Admitting: Family Medicine

## 2021-11-06 ENCOUNTER — Ambulatory Visit (INDEPENDENT_AMBULATORY_CARE_PROVIDER_SITE_OTHER): Payer: Medicaid Other | Admitting: Family Medicine

## 2021-11-06 VITALS — BP 104/60 | HR 84 | Temp 97.8°F | Ht 64.0 in | Wt 138.2 lb

## 2021-11-06 DIAGNOSIS — W57XXXA Bitten or stung by nonvenomous insect and other nonvenomous arthropods, initial encounter: Secondary | ICD-10-CM | POA: Diagnosis not present

## 2021-11-06 DIAGNOSIS — R1084 Generalized abdominal pain: Secondary | ICD-10-CM | POA: Diagnosis not present

## 2021-11-06 DIAGNOSIS — S1096XA Insect bite of unspecified part of neck, initial encounter: Secondary | ICD-10-CM | POA: Diagnosis not present

## 2021-11-06 DIAGNOSIS — R5382 Chronic fatigue, unspecified: Secondary | ICD-10-CM | POA: Diagnosis not present

## 2021-11-06 DIAGNOSIS — T471X5A Adverse effect of other antacids and anti-gastric-secretion drugs, initial encounter: Secondary | ICD-10-CM

## 2021-11-06 DIAGNOSIS — K219 Gastro-esophageal reflux disease without esophagitis: Secondary | ICD-10-CM

## 2021-11-06 DIAGNOSIS — R5383 Other fatigue: Secondary | ICD-10-CM | POA: Insufficient documentation

## 2021-11-06 LAB — COMPREHENSIVE METABOLIC PANEL
ALT: 21 U/L (ref 0–35)
AST: 24 U/L (ref 0–37)
Albumin: 4.6 g/dL (ref 3.5–5.2)
Alkaline Phosphatase: 76 U/L (ref 39–117)
BUN: 11 mg/dL (ref 6–23)
CO2: 25 mEq/L (ref 19–32)
Calcium: 9.5 mg/dL (ref 8.4–10.5)
Chloride: 101 mEq/L (ref 96–112)
Creatinine, Ser: 0.65 mg/dL (ref 0.40–1.20)
GFR: 97.8 mL/min (ref >60.00)
Glucose, Bld: 119 mg/dL — ABNORMAL HIGH (ref 70–99)
Potassium: 4.2 mEq/L (ref 3.5–5.1)
Sodium: 136 mEq/L (ref 135–145)
Total Bilirubin: 0.7 mg/dL (ref 0.2–1.2)
Total Protein: 7.6 g/dL (ref 6.0–8.3)

## 2021-11-06 LAB — CBC WITH DIFFERENTIAL/PLATELET
Basophils Absolute: 0.1 10*3/uL (ref 0.0–0.1)
Basophils Relative: 0.7 % (ref 0.0–3.0)
Eosinophils Absolute: 0.3 10*3/uL (ref 0.0–0.7)
Eosinophils Relative: 2.8 % (ref 0.0–5.0)
HCT: 46.1 % — ABNORMAL HIGH (ref 36.0–46.0)
Hemoglobin: 16 g/dL — ABNORMAL HIGH (ref 12.0–15.0)
Lymphocytes Relative: 22.4 % (ref 12.0–46.0)
Lymphs Abs: 2 10*3/uL (ref 0.7–4.0)
MCHC: 34.7 g/dL (ref 30.0–36.0)
MCV: 93.7 fl (ref 78.0–100.0)
Monocytes Absolute: 0.5 10*3/uL (ref 0.1–1.0)
Monocytes Relative: 6 % (ref 3.0–12.0)
Neutro Abs: 6.1 10*3/uL (ref 1.4–7.7)
Neutrophils Relative %: 68.1 % (ref 43.0–77.0)
Platelets: 265 10*3/uL (ref 150.0–400.0)
RBC: 4.92 Mil/uL (ref 3.87–5.11)
RDW: 12.8 % (ref 11.5–15.5)
WBC: 9 10*3/uL (ref 4.0–10.5)

## 2021-11-06 LAB — TSH: TSH: 0.85 u[IU]/mL (ref 0.35–5.50)

## 2021-11-06 LAB — VITAMIN B12: Vitamin B-12: 384 pg/mL (ref 211–911)

## 2021-11-06 LAB — LIPASE: Lipase: 22 U/L (ref 11.0–59.0)

## 2021-11-06 MED ORDER — FAMOTIDINE 20 MG PO TABS: 20.0000 mg | ORAL_TABLET | Freq: Two times a day (BID) | ORAL | 1 refills | Status: DC

## 2021-11-06 NOTE — Assessment & Plan Note (Signed)
Takes ppi  Some epigastric tenderness today   pepcid px 20 mg bid

## 2021-11-06 NOTE — Assessment & Plan Note (Signed)
Tick bite in June - scab/scar remains  No rash  Felt "woozy" since then  Now with abd pain and headache/nausea and malaise  Tick labs drawn and sent

## 2021-11-06 NOTE — Patient Instructions (Signed)
Drink fluids Rest  Take your current medicines  Avoid ibuprofen or naproxen for now   Watch your temperature   Take generic pepcid 20 mg twice daily for stomach pain   Labs today  Out of work until Monday  If any symptoms become severe please go to the ER  If you develop a new rash

## 2021-11-06 NOTE — Assessment & Plan Note (Signed)
Most tender in epigastric area  Suspect possible gastritis  Labs ordered-pending results  Px pepcid 20 mg bid  Consider imaging if no imp and based on lab results

## 2021-11-06 NOTE — Assessment & Plan Note (Signed)
B12 added to labs 

## 2021-11-06 NOTE — Progress Notes (Signed)
Subjective:    Patient ID: Julia [name], female    DOB: 1965/09/08, 56 y.o.   MRN: [id number]  HPI 56 yo pt presents with GI complaints  Wt Readings from Last 3 Encounters:  11/06/21 138 lb 3.2 oz (62.7 kg)  10/22/21 143 lb 4.8 oz (65 kg)  09/13/21 144 lb 3 oz (65.4 kg)   23.72 kg/m  Had a tick bite in June Thought it was a mole on her shoulder  Found out it was a tick  Unsure if got the whole tick off Has not felt right since  The area stayed swollen for a while  Unsure if it resembled a bullseye  Now it itches No rash  Has felt woozy since then   Does not drink alcohol at all  Stomach has been hurting since Sunday night   (missed work on Monday and Tuesday)  Sharp in nature  Comes and goes  No appetite  Eating does make it worse  Headache Weakness  Nausea but no vomiting  No diarrhea  No urinary symptoms   No fever   Has had some runny nose  A little  ? Coming down with something   Has had cancer in the past     [place] in the past   H/o GERD Takes daily omeprazole   Current smoker  Tames metformin for DM2  Patient Active Problem List   Diagnosis Date Noted   Fatigue 11/06/2021   Abdominal pain, generalized 11/06/2021   Tick bite of neck 11/06/2021   Adverse effect of proton pump inhibitor 11/06/2021   Acute diffuse otitis externa of left ear 09/13/2021   Left acute otitis media 09/13/2021   GERD (gastroesophageal reflux disease) 07/25/2021   Thumb pain 07/25/2021   Cancer of left female breast ([place]) 05/06/2021   Surgical counseling visit 04/29/2021   Carpal tunnel syndrome 10/21/2020   Osteoporosis 10/19/2020   Diabetes mellitus without complication ([place]) 25/00/3704   Port-A-Cath in place 11/29/2019   Malignant neoplasm of upper-outer quadrant of left breast in female, estrogen receptor positive ([place]) 09/01/2019   Encounter for chronic pain management 07/21/2019   Essential (primary) hypertension 02/25/2019   Radiculopathy, lumbar region  02/25/2019   Migraine headache 02/04/2018   Osteoarthritis of facet joint of lumbar spine 08/18/2017   Sciatica, left side 07/18/2017   Syncope 04/14/2017   Cough 08/07/2016   Rash 07/09/2016   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   Advance care planning 12/22/2014   Plantar fasciitis 10/12/2014   Shingles 11/14/2012   Routine general medical examination at a health care facility 05/07/2011   SKIN LESION 06/22/2008   Depression 12/15/2007   TOBACCO ABUSE 01/27/2007   HYPERLIPIDEMIA, MIXED 12/14/2006   Past Medical History:  Diagnosis Date   Acute bronchitis 04/19/2021   Anxiety    over cancer diagnosis   Asthma    bronchial asthma    Cancer ([place]) 09/2019   left breast invasive mammary cancer   Chronic back pain    COPD (chronic obstructive pulmonary disease) ([place])    smokes 1 1/2 ppd   Diabetes mellitus without complication ([facility])    GERD (gastroesophageal reflux disease)    Personal history of chemotherapy    Personal history of radiation therapy    Pneumonia    hx of 2018    Smoking    Past Surgical History:  Procedure Laterality Date   BREAST BIOPSY Left 09/2019   x2   BREAST BIOPSY Left 11/22/2020  BREAST LUMPECTOMY Left 10/2019   BREAST LUMPECTOMY WITH SENTINEL LYMPH NODE BIOPSY Left 10/07/2019   Procedure: LEFT BREAST LUMPECTOMY WITH SENTINEL LYMPH NODE BX;  Surgeon: Jovita Kussmaul, MD;  Location: [facility];  Service: General;  Laterality: Left;  PEC BLOCK   CHOLECYSTECTOMY     PORTACATH PLACEMENT Right 11/10/2019   Procedure: INSERTION PORT-A-CATH;  Surgeon: Jovita Kussmaul, MD;  Location: [facility];  Service: General;  Laterality: Right;   TONSILLECTOMY     TOTAL MASTECTOMY Left 05/06/2021   Procedure: LEFT TOTAL MASTECTOMY;  Surgeon: Jovita Kussmaul, MD;  Location: [facility];  Service: General;  Laterality: Left;   VULVECTOMY N/A 01/30/2015   Procedure: WIDE LOCAL EXCISION VULVA;  Surgeon: Everitt Amber, MD;  Location: [facility];   Service: Gynecology;  Laterality: N/A;   Social History   Tobacco Use   Smoking status: Every Day    Packs/day: 2.00    Years: 31.00    Total pack years: 62.00    Types: Cigarettes   Smokeless tobacco: Never  Vaping Use   Vaping Use: Never used  Substance Use Topics   Alcohol use: No    Alcohol/week: 0.0 standard drinks of alcohol   Drug use: No   Family History  Problem Relation Age of Onset   Hypertension Mother    Diabetes Mother        insulin dependent   Heart disease Mother    Diabetes Father    Heart disease Father        CAD   Hypertension Brother    Kidney disease Brother        kidney failure/resolved   Colon cancer Maternal Aunt        dx'd at ~62   Breast cancer Maternal Aunt 65   Allergies  Allergen Reactions   Propoxyphene N-Acetaminophen Hives   Nabumetone Rash    She can tolerate ibuprofen w/o difficulty   Rofecoxib Rash    Nightmares (vioxx)   Current Outpatient Medications on File Prior to Visit  Medication Sig Dispense Refill   Accu-Chek Softclix Lancets lancets Check sugar once daily. 100 each 5   albuterol (PROVENTIL) (2.5 MG/3ML) 0.083% nebulizer solution Take 3 mLs (2.5 mg total) by nebulization every 6 (six) hours as needed for wheezing or shortness of breath. 75 mL 1   anastrozole (ARIMIDEX) 1 MG tablet TAKE 1 TABLET BY MOUTH EVERY DAY 90 tablet 3   blood glucose meter kit and supplies KIT Dispense based on patient and insurance preference. Use up to four times daily as directed. (FOR ICD-9 250.00, 250.01). 1 each 0   gabapentin (NEURONTIN) 300 MG capsule Take 2 capsules (600 mg total) by mouth 3 (three) times daily. Take up to 4 tabs a day (2 tabs twice a day) 180 capsule 3   glucose blood (ACCU-CHEK GUIDE) test strip USE TO TEST BLOOD SUGAR UP TO 4 TIMES A DAY AS DIRECTED 100 strip 3   HYDROcodone-acetaminophen (NORCO) 5-325 MG tablet Take 1-2 tablets by mouth every 6 (six) hours as needed for severe pain. Sedation caution 60 tablet 0    ibuprofen (ADVIL) 600 MG tablet TAKE 1 TABLET (600 MG TOTAL) BY MOUTH EVERY 8 (EIGHT) HOURS AS NEEDED (FOR PAIN. TAKE WITH FOOD.). 90 tablet 2   metFORMIN (GLUCOPHAGE) 500 MG tablet TAKE 1 TABLET BY MOUTH ONCE DAILY WITH MEAL 90 tablet 1   omeprazole (PRILOSEC) 20 MG capsule Take 1 capsule (20 mg total) by mouth daily. 90 capsule 1  VENTOLIN HFA 108 (90 Base) MCG/ACT inhaler INHALE 2 PUFFS INTO THE LUNGS EVERY 6 HOURS AS NEEDED FOR WHEEZING OR SHORTNESS OF BREATH 18 each 2   [DISCONTINUED] prochlorperazine (COMPAZINE) 10 MG tablet Take 1 tablet (10 mg total) by mouth every 6 (six) hours as needed (Nausea or vomiting). 30 tablet 0   No current facility-administered medications on file prior to visit.      Review of Systems  Constitutional:  Positive for fatigue. Negative for activity change, appetite change, fever and unexpected weight change.  HENT:  Positive for postnasal drip. Negative for congestion, ear pain, rhinorrhea, sinus pressure and sore throat.   Eyes:  Negative for pain, redness and visual disturbance.  Respiratory:  Negative for cough, shortness of breath and wheezing.   Cardiovascular:  Negative for chest pain and palpitations.  Gastrointestinal:  Positive for abdominal pain and nausea. Negative for abdominal distention, blood in stool, constipation, diarrhea and vomiting.  Endocrine: Negative for polydipsia, polyphagia and polyuria.  Genitourinary:  Negative for dysuria, frequency and urgency.  Musculoskeletal:  Positive for myalgias. Negative for arthralgias and back pain.  Skin:  Negative for pallor and rash.  Allergic/Immunologic: Negative for environmental allergies.  Neurological:  Positive for headaches. Negative for dizziness and syncope.  Hematological:  Negative for adenopathy. Does not bruise/bleed easily.  Psychiatric/Behavioral:  Negative for decreased concentration and dysphoric mood. The patient is not nervous/anxious.        Objective:   Physical  Exam Constitutional:      General: She is not in acute distress.    Appearance: Normal appearance. She is well-developed and normal weight. She is not ill-appearing or diaphoretic.  HENT:     Head: Normocephalic and atraumatic.     Right Ear: Tympanic membrane and ear canal normal.     Left Ear: Tympanic membrane and ear canal normal.     Nose: Nose normal.     Mouth/Throat:     Mouth: Mucous membranes are moist.  Eyes:     General: No scleral icterus.       Right eye: No discharge.        Left eye: No discharge.     Conjunctiva/sclera: Conjunctivae normal.     Pupils: Pupils are equal, round, and reactive to light.  Neck:     Thyroid: No thyromegaly.     Vascular: No carotid bruit or JVD.  Cardiovascular:     Rate and Rhythm: Normal rate and regular rhythm.     Heart sounds: Normal heart sounds.     No gallop.  Pulmonary:     Effort: Pulmonary effort is normal. No respiratory distress.     Breath sounds: Normal breath sounds. No stridor. No wheezing, rhonchi or rales.     Comments: Diffusely distant bs  Abdominal:     General: Abdomen is flat. Bowel sounds are normal. There is no distension or abdominal bruit.     Palpations: Abdomen is soft. There is no hepatomegaly, splenomegaly or pulsatile mass.     Tenderness: There is generalized abdominal tenderness and tenderness in the epigastric area. There is no right CVA tenderness, left CVA tenderness, guarding or rebound. Negative signs include Murphy's sign and McBurney's sign.     Hernia: No hernia is present.     Comments: Tender in bilateral upper and L lower quadrants-mild Most prominent in epigastric area  No rebound or guarding   Musculoskeletal:     Cervical back: Normal range of motion and neck supple. No tenderness.  Right lower leg: No edema.     Left lower leg: No edema.  Lymphadenopathy:     Cervical: No cervical adenopathy.  Skin:    General: Skin is warm and dry.     Coloration: Skin is not pale.      Findings: No rash.  Neurological:     Mental Status: She is alert.     Coordination: Coordination normal.     Deep Tendon Reflexes: Reflexes are normal and symmetric. Reflexes normal.  Psychiatric:        Mood and Affect: Mood normal. Affect is flat.           Assessment & Plan:   Problem List Items Addressed This Visit       Digestive   GERD (gastroesophageal reflux disease)    Takes ppi  Some epigastric tenderness today   pepcid px 20 mg bid        Relevant Medications   famotidine (PEPCID) 20 MG tablet     Musculoskeletal and Integument   Tick bite of neck    Tick bite in June - scab/scar remains  No rash  Felt "woozy" since then  Now with abd pain and headache/nausea and malaise  Tick labs drawn and sent       Relevant Orders   B. burgdorfi antibodies by WB   Rocky mtn spotted fvr abs pnl(IgG+IgM)     Other   Abdominal pain, generalized - Primary    Most tender in epigastric area  Suspect possible gastritis  Labs ordered-pending results  Px pepcid 20 mg bid  Consider imaging if no imp and based on lab results        Relevant Orders   CBC with Differential/Platelet (Completed)   Comprehensive metabolic panel (Completed)   Lipase (Completed)   Adverse effect of proton pump inhibitor    B12 added to labs       Fatigue   Relevant Orders   CBC with Differential/Platelet (Completed)   Comprehensive metabolic panel (Completed)   TSH (Completed)   Vitamin B12 (Completed)

## 2021-11-08 ENCOUNTER — Telehealth: Payer: Self-pay

## 2021-11-08 NOTE — Telephone Encounter (Signed)
-----   Message from Abner Greenspan, MD sent at 11/08/2021  4:23 PM EDT ----- Lyme disease antibody test is negative  The rocky mt spotted fever test is still pending

## 2021-11-08 NOTE — Telephone Encounter (Signed)
I left a message for the patient to return my call.

## 2021-11-11 NOTE — Telephone Encounter (Signed)
Patient notified as instructed by telephone and verbalized understanding. Patient stated that she is sick as a dog. Patient stated that she can't eat anything. Patient stated when she tries to eat she cramps up in a ball. Patient denies a fever.   Pharmacy [facility] [address]

## 2021-11-11 NOTE — Telephone Encounter (Signed)
Called and spoke w/ pt made her an appt , the only day that she said that she could come in was Wednesday of this week.  Made an appt for the 11/13/21 @ 830am

## 2021-11-11 NOTE — Telephone Encounter (Signed)
Is it okay being her back to see you

## 2021-11-11 NOTE — Telephone Encounter (Signed)
She needs a f/u appt with first available please I think Dr Eulas Post is out this week?

## 2021-11-11 NOTE — Telephone Encounter (Signed)
yes

## 2021-11-11 NOTE — Telephone Encounter (Signed)
Left a message on voicemail for patient to call the office back. 

## 2021-11-11 NOTE — Telephone Encounter (Signed)
Patient returned the call she received.

## 2021-11-12 LAB — ROCKY MTN SPOTTED FVR ABS PNL(IGG+IGM)
RMSF IgG: NOT DETECTED
RMSF IgM: NOT DETECTED

## 2021-11-12 LAB — B. BURGDORFI ANTIBODIES BY WB
B burgdorferi IgG Abs (IB): NEGATIVE
B burgdorferi IgM Abs (IB): NEGATIVE
Lyme Disease 18 kD IgG: NONREACTIVE
Lyme Disease 23 kD IgG: NONREACTIVE
Lyme Disease 23 kD IgM: NONREACTIVE
Lyme Disease 28 kD IgG: NONREACTIVE
Lyme Disease 30 kD IgG: NONREACTIVE
Lyme Disease 39 kD IgG: NONREACTIVE
Lyme Disease 39 kD IgM: NONREACTIVE
Lyme Disease 41 kD IgG: NONREACTIVE
Lyme Disease 41 kD IgM: NONREACTIVE
Lyme Disease 45 kD IgG: REACTIVE — AB
Lyme Disease 58 kD IgG: NONREACTIVE
Lyme Disease 66 kD IgG: NONREACTIVE
Lyme Disease 93 kD IgG: NONREACTIVE

## 2021-11-12 NOTE — Telephone Encounter (Signed)
Noted. Thanks.

## 2021-11-13 ENCOUNTER — Other Ambulatory Visit: Payer: Medicaid Other

## 2021-11-13 ENCOUNTER — Ambulatory Visit: Payer: Medicaid Other | Admitting: Family Medicine

## 2021-11-13 ENCOUNTER — Ambulatory Visit: Payer: Medicaid Other

## 2021-11-26 NOTE — Progress Notes (Signed)
HEMATOLOGY-ONCOLOGY TELEPHONE VISIT PROGRESS NOTE  I connected with our patient on 12/03/21 at  8:45 AM EDT by telephone and verified that I am speaking with the correct person using two identifiers.  I discussed the limitations, risks, security and privacy concerns of performing an evaluation and management service by telephone and the availability of in person appointments.  I also discussed with the patient that there may be a patient responsible charge related to this service. The patient expressed understanding and agreed to proceed.   History of Present Illness: Julia [name] is a 56 y.o. with above-mentioned history of left breast cancer treated with lumpectomy, adjuvant chemotherapy, radiation, and is currently on antiestrogen therapy with anastrozole. She presents to the clinic today for a telephone follow-up.   Oncology History  Malignant neoplasm of upper-outer quadrant of left breast in female, estrogen receptor positive ([place])  08/25/2019 Initial Diagnosis   Palpable left breast mass x1 month. Diagnostic mammogram and [place] on 08/18/19 showed a 2.8cm mass at the 2 o'clock position, and one lymph node with mild cortical thickening in the left axilla. 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   10/18/2019 Cancer Staging   Staging form: Breast, AJCC 8th Edition - Pathologic stage from 10/18/2019: Stage IB (pT2, pN0(sn), cM0, G3, ER+, PR+, HER2-)   10/21/2019 Oncotype testing   The Oncotype DX score was 32 predicting a risk of outside the breast recurrence over the next 9 years of 20% if the patient's only systemic therapy is tamoxifen for 5 years.    11/15/2019 - 03/27/2020 Chemotherapy   Adjuvant  chemotherapy with dose dense Adriamycin and Cytoxan x4 (11/15/2019 - 12/27/2019) followed by Taxol weekly x12 (01/09/2020 - 03/27/20).   04/25/2020 - 05/25/2020 Radiation Therapy   The patient initially received a dose of 42.56 Gy in 16 fractions to the breast using whole-breast tangent fields. This was delivered using a 3-D conformal technique. The pt received a boost delivering an additional 8 Gy in 4 fractions using a electron boost with 24mV electrons. The total dose was 50.56 Gy.   05/2020 -  Anti-estrogen oral therapy   Anastrozole   05/06/2021 Surgery   Left mastectomy: Benign (performed because of left breast fibrosis and pain)     REVIEW OF SYSTEMS:   Constitutional: Denies fevers, chills or abnormal weight loss All other systems were reviewed with the patient and are negative. 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  ER 90%, PR 90%, HER-2 negative by FISH Posterior margin positive but no further surgery done because it is muscle on the back. T2N0 stage Ib Oncotype DX score 32: 20% risk of distant recurrence in 9 years   Treatment plan:  1. Adjuvant chemotherapy with dose dense Adriamycin and Cytoxan x4 followed by Taxol weekly x12 completed 03/27/21 2. followed by radiation therapy started 04/26/20 3.  Followed by adjuvant antiestrogen therapy.  Started February 2022 ------------------------------------------------------------------------------------------------------------------------------------------------- Anastrozole toxicities: She is tolerating it extremely well. Osteoporosis: Prolia every 6 months beginning 8/1/202   Left breast pain:  Left mastectomy 04/29/2021: Benign.  We referred her to physical therapy for dry needling     Severe left chest wall pain: In spite of taking pain medications and gabapentin. Pain persisted in spite of increasing gabapentin dose to 600 p.o. 3 times daily   Breast cancer surveillance:  Right breast mammogram 10/14/2021: Benign, breast density category B Osteoporosis: Every 6 months Prolia injections  She's considering disability because shes having difficulty doing work because of intractable pain in chest  Abdominal pain: Diffuse and constant. Will obtain CT abdomen and pelvis for further assessment  We will call the patient next week to discuss results.      I discussed the assessment and treatment plan with the patient. The patient was provided an opportunity to ask questions and all were answered. The patient agreed with the plan and demonstrated an understanding of the instructions. The patient was advised to call back or seek an in-person evaluation if the symptoms worsen or if the condition fails to improve as anticipated.   I provided 15 minutes of non-face-to-face time during this encounter.  This includes time for charting and coordination of care   Harriette Ohara, MD   I Gardiner Coins am scribing for Dr. Lindi Adie  I have reviewed the above documentation for accuracy and completeness, and I agree with the above.

## 2021-11-27 ENCOUNTER — Other Ambulatory Visit: Payer: Self-pay | Admitting: Hematology and Oncology

## 2021-12-03 ENCOUNTER — Inpatient Hospital Stay: Payer: Medicaid Other | Attending: Hematology and Oncology | Admitting: Hematology and Oncology

## 2021-12-03 DIAGNOSIS — C50412 Malignant neoplasm of upper-outer quadrant of left female breast: Secondary | ICD-10-CM | POA: Diagnosis not present

## 2021-12-03 DIAGNOSIS — Z170 Estrogen receptor positive status [ER+]: Secondary | ICD-10-CM | POA: Diagnosis not present

## 2021-12-03 DIAGNOSIS — R1084 Generalized abdominal pain: Secondary | ICD-10-CM

## 2021-12-03 NOTE — Assessment & Plan Note (Addendum)
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 T2N0 stage Ib Oncotype DX score 32: 20% risk of distant recurrence in 9 years  Treatment plan:  1. Adjuvant chemotherapy with dose dense Adriamycin and Cytoxan x4 followed by Taxol weekly x12completed 03/27/21 2. followed by radiation therapystarted 04/26/20 3. Followed by adjuvant antiestrogen therapy.Started February 2022 ------------------------------------------------------------------------------------------------------------------------------------------------- Anastrozole toxicities:She is tolerating it extremely well. Osteoporosis: Prolia every 6 months beginning 8/1/202  Left breast pain: Left mastectomy 04/29/2021: Benign.  We referred her to physical therapy for dry needling   Severe left chest wall pain: In spite of taking pain medications and gabapentin. Pain persisted in spite of increasing gabapentin dose to 600 p.o. 3 times daily  Breast cancer surveillance:  Right breast mammogram 10/14/2021: Benign, breast density category B Osteoporosis: Every 6 months Prolia injections  She's considering disability because shes having difficulty doing work because of intractable pain in chest  Abdominal pain: Diffuse and constant. Will obtain CT abdomen and pelvis for further assessment  We will call the patient next week to discuss results.

## 2021-12-04 ENCOUNTER — Other Ambulatory Visit: Payer: Self-pay | Admitting: Hematology and Oncology

## 2021-12-04 ENCOUNTER — Ambulatory Visit (HOSPITAL_COMMUNITY)
Admission: RE | Admit: 2021-12-04 | Discharge: 2021-12-04 | Disposition: A | Discharge status: Home / Self Care | Payer: Medicaid Other | Source: Ambulatory Visit | Attending: Hematology and Oncology | Admitting: Hematology and Oncology

## 2021-12-04 ENCOUNTER — Other Ambulatory Visit: Payer: Self-pay | Admitting: Family Medicine

## 2021-12-04 ENCOUNTER — Telehealth: Payer: Self-pay | Admitting: Hematology and Oncology

## 2021-12-04 DIAGNOSIS — K6389 Other specified diseases of intestine: Secondary | ICD-10-CM | POA: Diagnosis not present

## 2021-12-04 DIAGNOSIS — R1084 Generalized abdominal pain: Secondary | ICD-10-CM | POA: Insufficient documentation

## 2021-12-04 DIAGNOSIS — K5730 Diverticulosis of large intestine without perforation or abscess without bleeding: Secondary | ICD-10-CM | POA: Diagnosis not present

## 2021-12-04 MED ORDER — IOHEXOL 300 MG/ML  SOLN
100.0000 mL | Freq: Once | INTRAMUSCULAR | Status: AC | PRN
  Administered 2021-12-04: 100 mL via INTRAVENOUS

## 2021-12-04 MED ORDER — SODIUM CHLORIDE (PF) 0.9 % IJ SOLN
INTRAMUSCULAR | Status: AC
  Filled 2021-12-04: qty 50

## 2021-12-04 NOTE — Telephone Encounter (Signed)
I reviewed the CT scan with the patient.  It does show gastritis and diverticulosis and small pulmonary nodules which do not need any additional work-up. She currently takes Prilosec for gastritis.  I offered her gastroenterology evaluation but she declined.

## 2021-12-08 ENCOUNTER — Other Ambulatory Visit: Payer: Self-pay | Admitting: Hematology and Oncology

## 2021-12-09 ENCOUNTER — Other Ambulatory Visit: Payer: Self-pay | Admitting: Hematology and Oncology

## 2021-12-10 MED ORDER — HYDROCODONE-ACETAMINOPHEN 5-325 MG PO TABS: 1.0000 | ORAL_TABLET | Freq: Four times a day (QID) | ORAL | 0 refills | Status: DC | PRN

## 2021-12-12 ENCOUNTER — Ambulatory Visit: Payer: Medicaid Other | Attending: Hematology and Oncology

## 2021-12-22 ENCOUNTER — Other Ambulatory Visit: Payer: Self-pay | Admitting: Family Medicine

## 2021-12-29 ENCOUNTER — Other Ambulatory Visit: Payer: Self-pay | Admitting: Family Medicine

## 2021-12-30 ENCOUNTER — Other Ambulatory Visit: Payer: Self-pay | Admitting: Hematology and Oncology

## 2021-12-31 ENCOUNTER — Telehealth: Payer: Self-pay

## 2021-12-31 ENCOUNTER — Other Ambulatory Visit: Payer: Self-pay | Admitting: Hematology and Oncology

## 2021-12-31 ENCOUNTER — Other Ambulatory Visit: Payer: Self-pay | Admitting: *Deleted

## 2021-12-31 ENCOUNTER — Encounter: Payer: Self-pay | Admitting: Family Medicine

## 2021-12-31 ENCOUNTER — Ambulatory Visit (INDEPENDENT_AMBULATORY_CARE_PROVIDER_SITE_OTHER): Payer: Medicaid Other | Admitting: Family Medicine

## 2021-12-31 VITALS — BP 100/60 | HR 83 | Temp 98.0°F | Ht 64.0 in | Wt 138.0 lb

## 2021-12-31 DIAGNOSIS — G43909 Migraine, unspecified, not intractable, without status migrainosus: Secondary | ICD-10-CM | POA: Diagnosis not present

## 2021-12-31 DIAGNOSIS — E119 Type 2 diabetes mellitus without complications: Secondary | ICD-10-CM | POA: Diagnosis not present

## 2021-12-31 MED ORDER — SUMATRIPTAN SUCCINATE 50 MG PO TABS: 50.0000 mg | ORAL_TABLET | ORAL | 0 refills | Status: DC | PRN

## 2021-12-31 MED ORDER — ALBUTEROL SULFATE HFA 108 (90 BASE) MCG/ACT IN AERS: 2.0000 | INHALATION_SPRAY | Freq: Four times a day (QID) | RESPIRATORY_TRACT | 3 refills | Status: DC | PRN

## 2021-12-31 MED ORDER — ONDANSETRON HCL 4 MG PO TABS: 4.0000 mg | ORAL_TABLET | Freq: Three times a day (TID) | ORAL | 0 refills | Status: DC | PRN

## 2021-12-31 MED ORDER — HYDROCODONE-ACETAMINOPHEN 5-325 MG PO TABS: 1.0000 | ORAL_TABLET | Freq: Four times a day (QID) | ORAL | 0 refills | Status: DC | PRN

## 2021-12-31 NOTE — Telephone Encounter (Signed)
[facility] Night - Client TELEPHONE ADVICE RECORD [facility] Patient Name: Julia [name] [facility] Gender: Female DOB: October 25, 1965 Age: 56 Y 62 M 21 D Return Phone Number: [phone] (Primary) Address: City/ State/ Zip: [place]  [address] Client [facility] Night - Client Client Site [place] - Night Provider Renford Dills - MD Contact Type Call Who Is Calling Patient / Member / Family / Caregiver Call Type Triage / Clinical Relationship To Patient Self Return Phone Number [phone] (Primary) Chief Complaint Dizziness Reason for Call Request to Schedule Office Appointment Initial Comment Caller states she would like to make an appointment. Her blood pressure is 166/95. She states she is seeing stars, she is dizzy. She was sent home by the nurse at work Translation No Nurse Assessment Nurse: Kirk Ruths, RN, [place] [facility] Date/Time (Eastern Time): 12/31/2021 7:56:44 AM Confirm and document reason for call. If symptomatic, describe symptoms. ---Caller states she is having blood pressure 166/95 no history of blood pressure issues blood sugar 140 has had headache for the past three days seeing stars dizziness trying to stand makes it worse no chest pain no numbness exertion equals shortness of breath slight runny nose no fever Does the patient have any new or worsening symptoms? ---Yes Will a triage be completed? ---Yes Related visit to physician within the last 2 weeks? ---No Does the PT have any chronic conditions? (i.e. diabetes, asthma, this includes High risk factors for pregnancy, etc.) ---Yes List chronic conditions. ---breast cancer Is this a behavioral health or substance abuse call? ---No Guidelines Guideline Title Affirmed Question Affirmed Notes Nurse Date/Time (Eastern Time) Blood Pressure - High [3] Systolic BP >= 664 OR Diastolic >= 403 AND [4] cardiac (e.g., breathing difficulty,  chest pain) or neurologic Tristan Schroeder 12/31/2021 7:59:41 AM PLEASE NOTE: All timestamps contained within this report are represented as [place] Standard Time. CONFIDENTIALTY NOTICE: This fax transmission is intended only for the addressee. It contains information that is legally privileged, confidential or otherwise protected from use or disclosure. If you are not the intended recipient, you are strictly prohibited from reviewing, disclosing, copying using or disseminating any of this information or taking any action in reliance on or regarding this information. If you have received this fax in error, please notify us immediately by telephone so that we can arrange for its return to [place]. Phone: [phone], Toll-Free: [phone], Fax: [phone] Page: 2 of 2 Call Id: [id number] Guidelines Guideline Title Affirmed Question Affirmed Notes Nurse Date/Time Eilene Ghazi Time) symptoms (e.g., new-onset blurred or double vision, unsteady gait) Disp. Time Eilene Ghazi Time) Disposition Final User 12/31/2021 8:01:25 AM Go to ED Now Yes Kirk Ruths, RN, [place] [facility] Final Disposition 12/31/2021 8:01:25 AM Go to ED Now Yes Kirk Ruths, RN, Liana Gerold Disagree/Comply Comply Caller Understands Yes PreDisposition Call Doctor Care Advice Given Per Guideline GO TO ED NOW: * You need to be seen in the Emergency Department. * Go to the ED at ___________ [place] now. Drive carefully. NOTE TO TRIAGER - DRIVING: * Another adult should drive. * Patient should not delay going to the emergency department. * If immediate transportation is not available via car, rideshare (e.g., Lyft, Uber), or taxi, then the patient should be instructed to call [id number]. CARE ADVICE given per High Blood Pressure (Adult) guideline. Referrals [facility] - E

## 2021-12-31 NOTE — Progress Notes (Unsigned)
BP was 166/95 earlier this AM.  Felt dizzy at the time, was "seeing stars."  Sugar 140 at the time.  Sent home from work.  No new meds or clear trigger with her meds.    She was feeling well upon waking.  Quick onset, seeing stars.  The room was spinning.  She felt lightheaded.  No syncope.  Was still able to walk, talk, etc.  Drove home.  No numbness with the event.  No CP.  Didn't have to use inhaler.  No ear pain.  No vision loss or partial/field loss.    H/o similar in distant past.  Also with h/o migraine.  Has had a HA going on for 3 days.  This HA is similar to prev.  H/o photophobia.  Resting in a dark room helps.  No nausea now but had some recently.  Dec in appetite noted.  HA posterior to B eye.  Has been using flonase in the meantime.    Still on gabapentin at baseline.  Needed SABA refill, used prn.  Heat at work is a trigger for patient.  Work stressors d/w pt.    D/w pt about recheck A1c.  Still on metformin.

## 2021-12-31 NOTE — Telephone Encounter (Signed)
I spoke with pt;pt said she did not go to UC or ED because feeling little better. Pt said she is not dizzy or seeing stars right now. Now BP 123/73 P 82. Pt said she does not want to go to UC or ED. Pt scheduled appt with Dr Damita Dunnings 12/31/21 at 3PM due to pt not feeling well earlier. UC & ED precautions given and pt voiced understanding.sending note to Dr Damita Dunnings and Janett Billow CMA.

## 2021-12-31 NOTE — Patient Instructions (Signed)
Go to the lab on the way out.   If you have mychart we'll likely use that to update you.   Take care.  Glad to see you. Take imitrex if needed for headache and zofran if needed for nausea.   Out of work in the meantime.   Let me know if you are not improving.

## 2021-12-31 NOTE — Telephone Encounter (Signed)
Will see at OV.  Thanks.  

## 2021-12-31 NOTE — Telephone Encounter (Signed)
Please refuse this, you already filled it today.  Not sure why the pharmacy re sent it.

## 2022-01-01 LAB — CBC WITH DIFFERENTIAL/PLATELET
Basophils Absolute: 0 10*3/uL (ref 0.0–0.1)
Basophils Relative: 0.6 % (ref 0.0–3.0)
Eosinophils Absolute: 0.3 10*3/uL (ref 0.0–0.7)
Eosinophils Relative: 3.8 % (ref 0.0–5.0)
HCT: 43.3 % (ref 36.0–46.0)
Hemoglobin: 15.1 g/dL — ABNORMAL HIGH (ref 12.0–15.0)
Lymphocytes Relative: 24.3 % (ref 12.0–46.0)
Lymphs Abs: 1.9 10*3/uL (ref 0.7–4.0)
MCHC: 34.9 g/dL (ref 30.0–36.0)
MCV: 94 fl (ref 78.0–100.0)
Monocytes Absolute: 0.7 10*3/uL (ref 0.1–1.0)
Monocytes Relative: 8.3 % (ref 3.0–12.0)
Neutro Abs: 5 10*3/uL (ref 1.4–7.7)
Neutrophils Relative %: 63 % (ref 43.0–77.0)
Platelets: 263 10*3/uL (ref 150.0–400.0)
RBC: 4.6 Mil/uL (ref 3.87–5.11)
RDW: 13 % (ref 11.5–15.5)
WBC: 7.9 10*3/uL (ref 4.0–10.5)

## 2022-01-01 LAB — COMPREHENSIVE METABOLIC PANEL
ALT: 14 U/L (ref 0–35)
AST: 15 U/L (ref 0–37)
Albumin: 4.2 g/dL (ref 3.5–5.2)
Alkaline Phosphatase: 66 U/L (ref 39–117)
BUN: 9 mg/dL (ref 6–23)
CO2: 27 mEq/L (ref 19–32)
Calcium: 9.6 mg/dL (ref 8.4–10.5)
Chloride: 104 mEq/L (ref 96–112)
Creatinine, Ser: 0.7 mg/dL (ref 0.40–1.20)
GFR: 95.97 mL/min (ref >60.00)
Glucose, Bld: 105 mg/dL — ABNORMAL HIGH (ref 70–99)
Potassium: 4 mEq/L (ref 3.5–5.1)
Sodium: 139 mEq/L (ref 135–145)
Total Bilirubin: 0.4 mg/dL (ref 0.2–1.2)
Total Protein: 7.2 g/dL (ref 6.0–8.3)

## 2022-01-01 LAB — HEMOGLOBIN A1C: Hgb A1c MFr Bld: 6.1 % (ref 4.6–6.5)

## 2022-01-01 NOTE — Assessment & Plan Note (Signed)
History of.  See notes on labs.  30 minutes were devoted to patient care in this encounter (this includes time spent reviewing the patient's file/history, interviewing and examining the patient, counseling/reviewing plan with patient).

## 2022-01-01 NOTE — Assessment & Plan Note (Signed)
I question if her symptoms are migraine related.  She could have had an aura to explain her vision changes.  She does not have vision loss.  No concern for stroke.  She has a normal neurologic exam. Discussed options. Take imitrex if needed for headache and zofran if needed for nausea.   Out of work in the meantime.  She will update me if not improving.

## 2022-01-06 ENCOUNTER — Telehealth: Payer: Self-pay | Admitting: Family Medicine

## 2022-01-06 NOTE — Telephone Encounter (Signed)
Patient called in returning a call she received. She didn't want to hold a minute, and stated she will answer when someone calls back.

## 2022-01-06 NOTE — Telephone Encounter (Signed)
See lab notes patient called and given information.  No further action needed at this time.

## 2022-01-07 ENCOUNTER — Other Ambulatory Visit: Payer: Self-pay | Admitting: Family Medicine

## 2022-01-07 MED ORDER — GABAPENTIN 300 MG PO CAPS: 600.0000 mg | ORAL_CAPSULE | Freq: Three times a day (TID) | ORAL | Status: DC

## 2022-01-18 IMAGING — MG DIGITAL DIAGNOSTIC BILAT W/ TOMO W/ CAD
5 of 9 series · 5 of 25 positions shown · non-contrast
Comparison: Previous exam(s).
COMPARISON: Previous exam(s).

Addendum:
CLINICAL DATA: History of LEFT breast cancer in 3139 status post
lumpectomy and radiation therapy.

EXAM:
DIGITAL DIAGNOSTIC BILATERAL MAMMOGRAM WITH TOMOSYNTHESIS AND CAD
TECHNIQUE: Bilateral digital diagnostic mammography and breast tomosynthesis
was performed. The images were evaluated with computer-aided
detection.

[L CC]
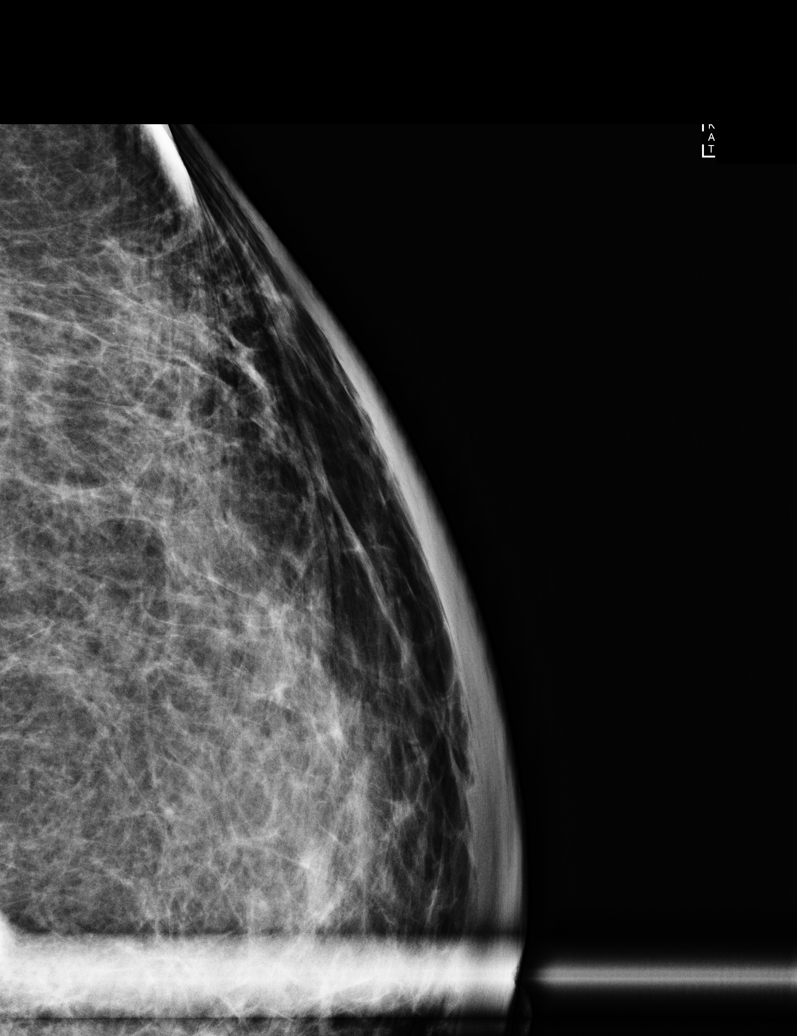

[L MLO synth-2D]
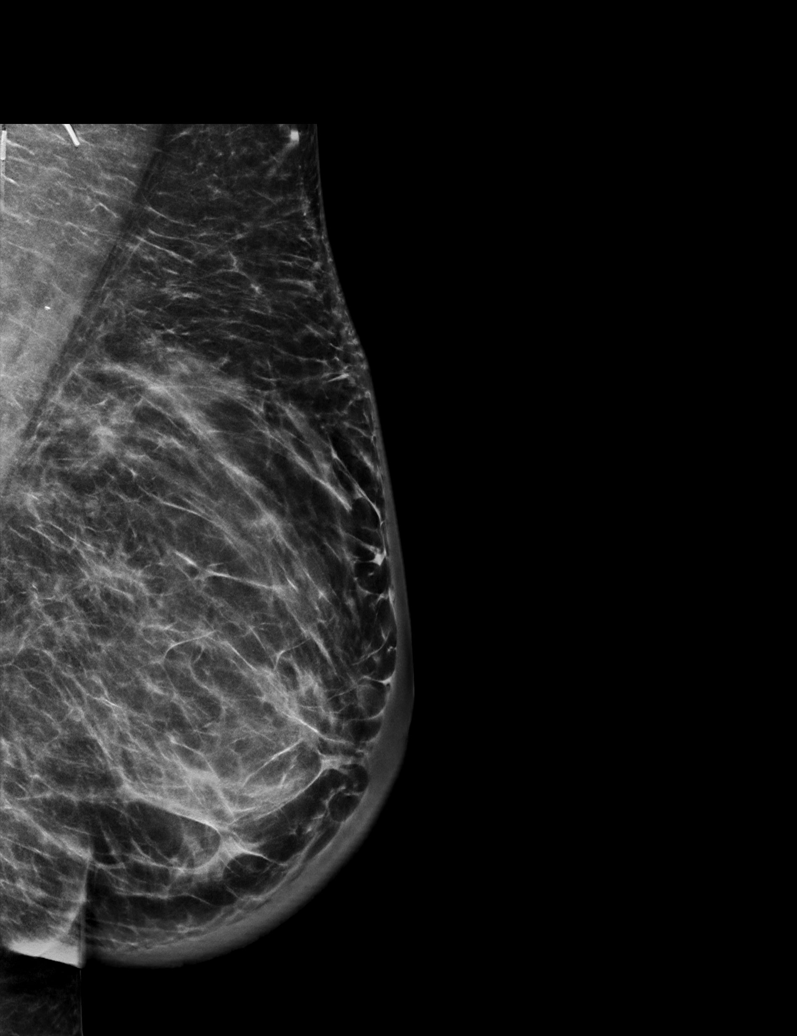

[L CC synth-2D]
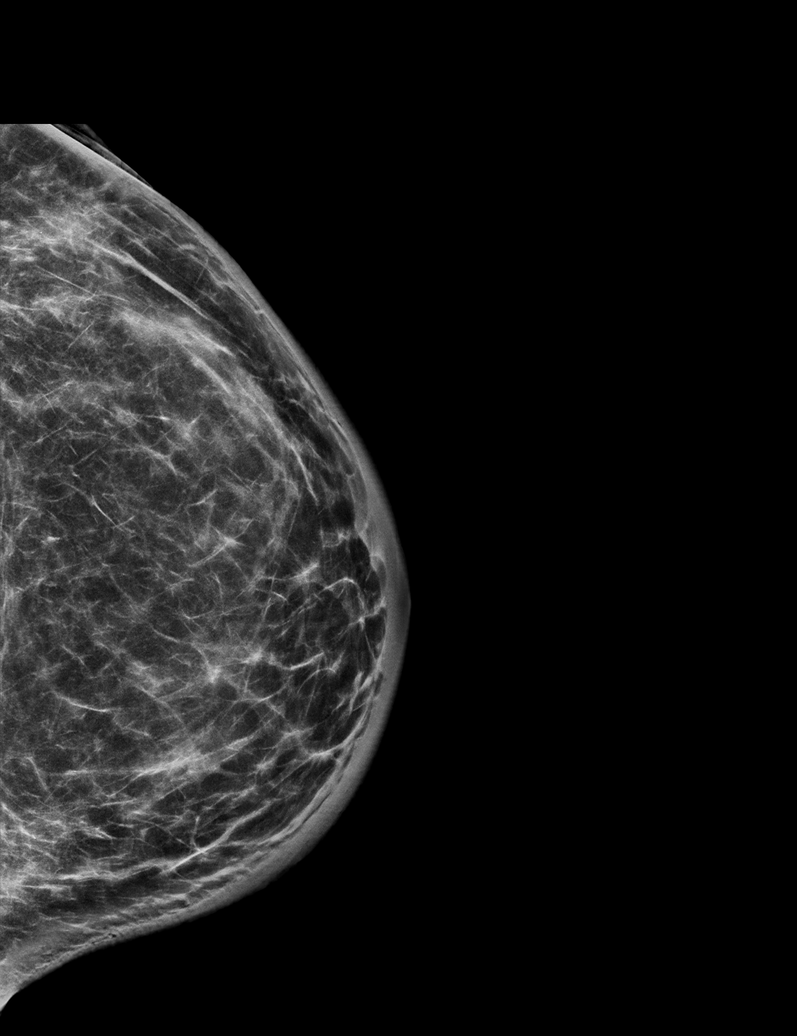

[R CC synth-2D]
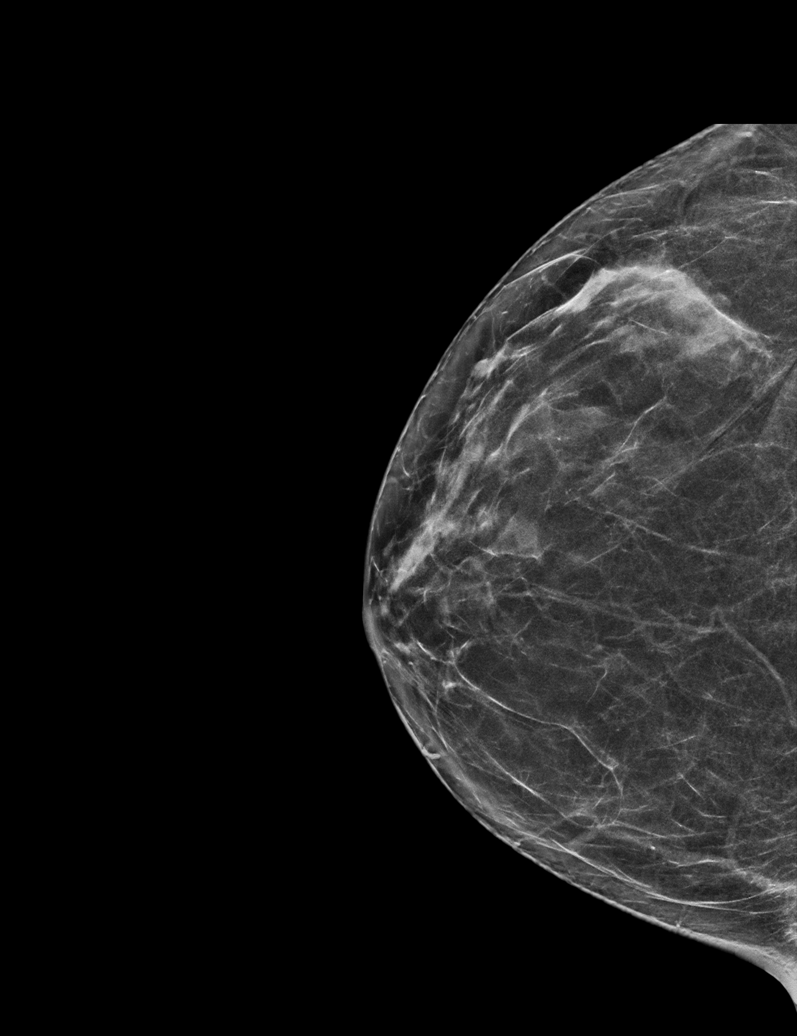

[R MLO synth-2D]
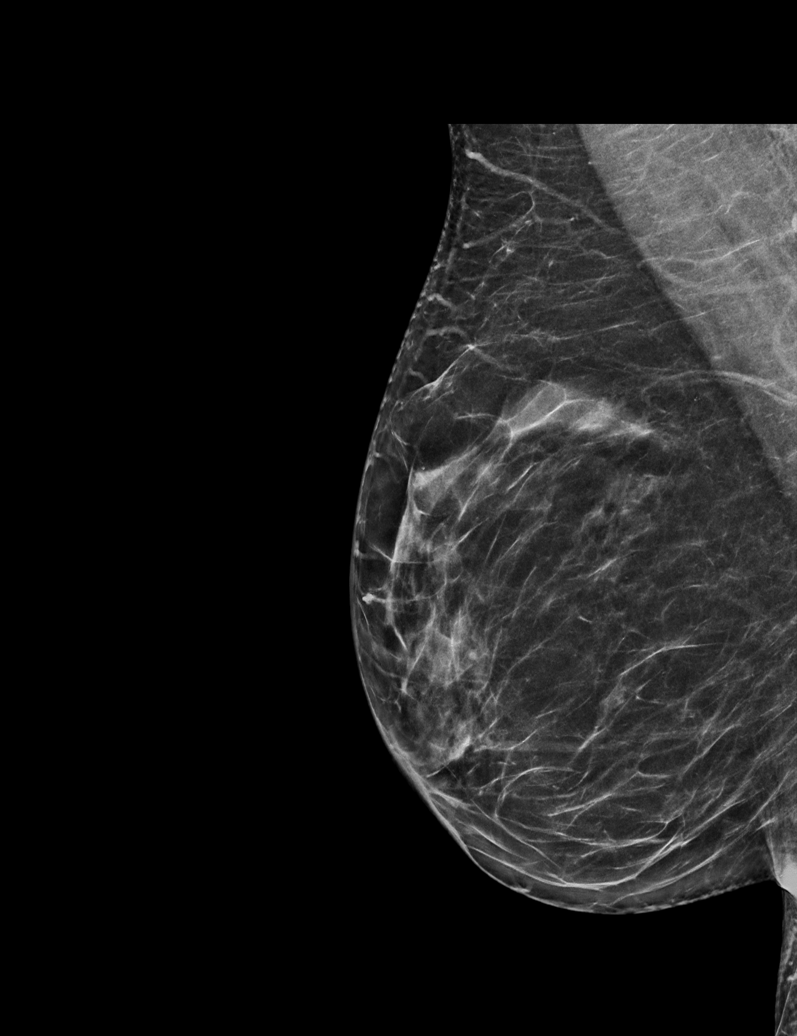

[5 of 25 positions shown; findings below may reference images not displayed]

ACR Breast Density Category b: There are scattered areas of
fibroglandular density.
FINDINGS: Patient describes persistent LEFT breast pain. LEFT breast is firm,
slightly warm to touch, and with a slightly pinkish/reddish color.

There are expected postsurgical changes within the LEFT breast.
There is diffuse skin thickening and diffuse trabecular thickening,
increased compared to previous exam, most likely post radiation
change.

There are no new dominant masses, suspicious calcifications or
secondary signs of malignancy within the RIGHT breast.
IMPRESSION: 1. LEFT breast with diffuse skin thickening and diffuse trabecular
thickening, with corresponding physical exam findings of breast
firmness, slight warmth to touch, and slightly pinkish/reddish
color. These are most likely prominent post radiation changes, but
inflammatory breast cancer recurrence cannot be excluded. Recommend
surgical consultation for possible skin punch biopsy.
2. Expected postsurgical changes within the outer LEFT breast.
3. No evidence of malignancy within the RIGHT breast.

RECOMMENDATION:
1. Surgical consultation for possible LEFT breast skin punch biopsy.
2. If skin punch biopsy is performed and reveals a benign pathology
result (post radiation change), recommend bilateral diagnostic
mammogram in 1 year.

I have discussed the findings and recommendations with the patient.
If applicable, a reminder letter will be sent to the patient
regarding the next appointment.

BI-RADS CATEGORY  4: Suspicious. Surgical consultation for possible
LEFT breast skin punch biopsy recommended.

ADDENDUM:

Surgical consultation for possible skin punch biopsy due to

increased skin thickening, firmness and redness. Patient has

surgical consultation appointment scheduled with Dr. Lmaryoula Viragiste on

Vinaria Grba RN  09/20/2020

NOTE: Patient attended appointment with Dr. Lmaryoula Viragiste on 10/02/2020
at 11:00 o'clock.

Vinaria Grba RN  10/09/2020

*** End of Addendum ***
ACR Breast Density Category b: There are scattered areas of
fibroglandular density.
FINDINGS: Patient describes persistent LEFT breast pain. LEFT breast is firm,
slightly warm to touch, and with a slightly pinkish/reddish color.

There are expected postsurgical changes within the LEFT breast.
There is diffuse skin thickening and diffuse trabecular thickening,
increased compared to previous exam, most likely post radiation
change.

There are no new dominant masses, suspicious calcifications or
secondary signs of malignancy within the RIGHT breast.
IMPRESSION: 1. LEFT breast with diffuse skin thickening and diffuse trabecular
thickening, with corresponding physical exam findings of breast
firmness, slight warmth to touch, and slightly pinkish/reddish
color. These are most likely prominent post radiation changes, but
inflammatory breast cancer recurrence cannot be excluded. Recommend
surgical consultation for possible skin punch biopsy.
2. Expected postsurgical changes within the outer LEFT breast.
3. No evidence of malignancy within the RIGHT breast.

RECOMMENDATION:
1. Surgical consultation for possible LEFT breast skin punch biopsy.
2. If skin punch biopsy is performed and reveals a benign pathology
result (post radiation change), recommend bilateral diagnostic
mammogram in 1 year.

I have discussed the findings and recommendations with the patient.
If applicable, a reminder letter will be sent to the patient
regarding the next appointment.

BI-RADS CATEGORY  4: Suspicious. Surgical consultation for possible
LEFT breast skin punch biopsy recommended.

## 2022-01-30 ENCOUNTER — Other Ambulatory Visit: Payer: Self-pay | Admitting: Hematology and Oncology

## 2022-01-31 MED ORDER — HYDROCODONE-ACETAMINOPHEN 5-325 MG PO TABS: 1.0000 | ORAL_TABLET | Freq: Four times a day (QID) | ORAL | 0 refills | Status: DC | PRN

## 2022-02-02 ENCOUNTER — Other Ambulatory Visit: Payer: Self-pay | Admitting: Hematology and Oncology

## 2022-02-02 ENCOUNTER — Other Ambulatory Visit: Payer: Self-pay | Admitting: Family Medicine

## 2022-02-03 MED ORDER — SUMATRIPTAN SUCCINATE 50 MG PO TABS: 50.0000 mg | ORAL_TABLET | ORAL | 0 refills | Status: DC | PRN

## 2022-02-03 NOTE — Telephone Encounter (Signed)
Refill request for gabapentin (NEURONTIN) 300 MG capsule  LOV - 12/31/21  Next OV - not scheduled Last refill - 01/07/22 entered as no print

## 2022-02-03 NOTE — Telephone Encounter (Signed)
Please deny, you filled 3 days ago.

## 2022-02-04 MED ORDER — GABAPENTIN 300 MG PO CAPS: 600.0000 mg | ORAL_CAPSULE | Freq: Three times a day (TID) | ORAL | 1 refills | Status: DC

## 2022-02-04 MED ORDER — HYDROCODONE-ACETAMINOPHEN 5-325 MG PO TABS: 1.0000 | ORAL_TABLET | Freq: Four times a day (QID) | ORAL | 0 refills | Status: DC | PRN

## 2022-02-13 ENCOUNTER — Other Ambulatory Visit: Payer: Self-pay | Admitting: Family Medicine

## 2022-02-17 ENCOUNTER — Other Ambulatory Visit: Payer: Self-pay | Admitting: Family Medicine

## 2022-02-25 ENCOUNTER — Other Ambulatory Visit: Payer: Self-pay | Admitting: Hematology and Oncology

## 2022-02-25 MED ORDER — HYDROCODONE-ACETAMINOPHEN 5-325 MG PO TABS: 1.0000 | ORAL_TABLET | Freq: Four times a day (QID) | ORAL | 0 refills | Status: DC | PRN

## 2022-03-09 ENCOUNTER — Other Ambulatory Visit: Payer: Self-pay | Admitting: Family Medicine

## 2022-03-20 ENCOUNTER — Other Ambulatory Visit: Payer: Self-pay | Admitting: Hematology and Oncology

## 2022-03-20 ENCOUNTER — Other Ambulatory Visit: Payer: Self-pay | Admitting: Family Medicine

## 2022-03-20 MED ORDER — SUMATRIPTAN SUCCINATE 50 MG PO TABS: 50.0000 mg | ORAL_TABLET | ORAL | 2 refills | Status: AC | PRN

## 2022-03-20 MED ORDER — HYDROCODONE-ACETAMINOPHEN 5-325 MG PO TABS: 1.0000 | ORAL_TABLET | Freq: Four times a day (QID) | ORAL | 0 refills | Status: DC | PRN

## 2022-03-21 ENCOUNTER — Other Ambulatory Visit: Payer: Self-pay | Admitting: Hematology and Oncology

## 2022-03-21 ENCOUNTER — Other Ambulatory Visit: Payer: Self-pay | Admitting: Family Medicine

## 2022-04-07 ENCOUNTER — Encounter (HOSPITAL_COMMUNITY): Payer: Self-pay

## 2022-04-07 ENCOUNTER — Ambulatory Visit (HOSPITAL_COMMUNITY)
Admission: EM | Admit: 2022-04-07 | Discharge: 2022-04-07 | Disposition: A | Discharge status: Home / Self Care | Payer: Medicaid Other | Attending: Internal Medicine | Admitting: Internal Medicine

## 2022-04-07 ENCOUNTER — Other Ambulatory Visit: Payer: Self-pay | Admitting: Hematology and Oncology

## 2022-04-07 DIAGNOSIS — H5789 Other specified disorders of eye and adnexa: Secondary | ICD-10-CM

## 2022-04-07 DIAGNOSIS — K047 Periapical abscess without sinus: Secondary | ICD-10-CM

## 2022-04-07 DIAGNOSIS — L03213 Periorbital cellulitis: Secondary | ICD-10-CM

## 2022-04-07 MED ORDER — IBUPROFEN 600 MG PO TABS: 600.0000 mg | ORAL_TABLET | Freq: Four times a day (QID) | ORAL | 0 refills | Status: DC | PRN

## 2022-04-07 MED ORDER — AMOXICILLIN-POT CLAVULANATE 875-125 MG PO TABS: 1.0000 | ORAL_TABLET | Freq: Two times a day (BID) | ORAL | 0 refills | Status: DC

## 2022-04-07 NOTE — ED Provider Notes (Signed)
[place]    CSN: [id number] Arrival date & time: 04/07/22  1216      History   Chief Complaint Chief Complaint  Patient presents with   Oral Swelling    HPI Julia [name] is a 57 y.o. female.   Patient presents urgent care for evaluation of left-sided oral swelling, left-sided dental pain, and redness and swelling to the surrounding area of the left eye that started upon waking yesterday.  She is missing multiple teeth to the left side of the mouth. Experiencing pain and swelling to the upper aspect of the left side of the mouth. Denies recent antibiotic use and recent dental work. Reports good dental hygiene habits. Denies eye pain, headache, fever/chills, chest pain, shortness of breath, nausea, vomiting, abdominal pain, diarrhea, cough, body aches, and sore throat. Denies drooling, difficulty maintaining secretions, and voice changes. No history of cerebrovascular accident or TIA. Denies one-sided weakness, changes in gait, or changes in cognition. No recent trauma or injury to the left eye or left mouth. History of breast cancer. Has not been taking any over the counter medications for pain prior to arrival at urgent care.    Past Medical History:  Diagnosis Date   Acute bronchitis 04/19/2021   Anxiety    over cancer diagnosis   Asthma    bronchial asthma    Cancer ([place]) 09/2019   left breast invasive mammary cancer   Chronic back pain    COPD (chronic obstructive pulmonary disease) ([place])    smokes 1 1/2 ppd   Diabetes mellitus without complication (HCC)    GERD (gastroesophageal reflux disease)    Personal history of chemotherapy    Personal history of radiation therapy    Pneumonia    hx of 2018    Smoking     Patient Active Problem List   Diagnosis Date Noted   Fatigue 11/06/2021   Tick bite of neck 11/06/2021   Adverse effect of proton pump inhibitor 11/06/2021   Left acute otitis media 09/13/2021   GERD (gastroesophageal reflux disease)  07/25/2021   Thumb pain 07/25/2021   Cancer of left female breast ([place]) 05/06/2021   Surgical counseling visit 04/29/2021   Carpal tunnel syndrome 10/21/2020   Osteoporosis 10/19/2020   Diabetes mellitus without complication ([place]) 65/06/5463   Port-A-Cath in place 11/29/2019   Malignant neoplasm of upper-outer quadrant of left breast in female, estrogen receptor positive ([place]) 09/01/2019   Encounter for chronic pain management 07/21/2019   Essential (primary) hypertension 02/25/2019   Radiculopathy, lumbar region 02/25/2019   Migraine headache 02/04/2018   Osteoarthritis of facet joint of lumbar spine 08/18/2017   Sciatica, left side 07/18/2017   Syncope 04/14/2017   Cough 08/07/2016   Rash 07/09/2016   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   Advance care planning 12/22/2014   Plantar fasciitis 10/12/2014   Shingles 11/14/2012   Routine general medical examination at a health care facility 05/07/2011   SKIN LESION 06/22/2008   Depression 12/15/2007   TOBACCO ABUSE 01/27/2007   HYPERLIPIDEMIA, MIXED 12/14/2006    Past Surgical History:  Procedure Laterality Date   BREAST BIOPSY Left 09/2019   x2   BREAST BIOPSY Left 11/22/2020   BREAST LUMPECTOMY Left 10/2019   BREAST LUMPECTOMY WITH SENTINEL LYMPH NODE BIOPSY Left 10/07/2019   Procedure: LEFT BREAST LUMPECTOMY WITH SENTINEL LYMPH NODE BX;  Surgeon: Jovita Kussmaul, MD;  Location: [facility];  Service: General;  Laterality: Left;  PEC BLOCK  CHOLECYSTECTOMY     PORTACATH PLACEMENT Right 11/10/2019   Procedure: INSERTION PORT-A-CATH;  Surgeon: Jovita Kussmaul, MD;  Location: [facility];  Service: General;  Laterality: Right;   TONSILLECTOMY     TOTAL MASTECTOMY Left 05/06/2021   Procedure: LEFT TOTAL MASTECTOMY;  Surgeon: Jovita Kussmaul, MD;  Location: [facility];  Service: General;  Laterality: Left;   VULVECTOMY N/A 01/30/2015   Procedure: WIDE LOCAL EXCISION VULVA;  Surgeon: Everitt Amber, MD;   Location: [facility];  Service: Gynecology;  Laterality: N/A;    OB History   No obstetric history on file.      Home Medications    Prior to Admission medications   Medication Sig Start Date End Date Taking? Authorizing Provider  amoxicillin-clavulanate (AUGMENTIN) 875-125 MG tablet Take 1 tablet by mouth every 12 (twelve) hours. 04/07/22  Yes Talbot Grumbling, FNP  ibuprofen (ADVIL) 600 MG tablet Take 1 tablet (600 mg total) by mouth every 6 (six) hours as needed. 04/07/22  Yes Nicolette Gieske, Stasia Cavalier, FNP  ACCU-CHEK GUIDE test strip USE TO TEST BLOOD SUGAR UP TO 4 TIMES A DAY AS DIRECTED 11/28/21   Nicholas Lose, MD  Accu-Chek Softclix Lancets lancets Check sugar once daily. 07/19/21   Nicholas Lose, MD  albuterol (PROVENTIL) (2.5 MG/3ML) 0.083% nebulizer solution Take 3 mLs (2.5 mg total) by nebulization every 6 (six) hours as needed for wheezing or shortness of breath. 10/13/19   Tonia Ghent, MD  albuterol (VENTOLIN HFA) 108 (90 Base) MCG/ACT inhaler Inhale 2 puffs into the lungs every 6 (six) hours as needed for wheezing or shortness of breath. 12/31/21   Tonia Ghent, MD  anastrozole (ARIMIDEX) 1 MG tablet TAKE 1 TABLET BY MOUTH EVERY DAY 03/21/22   Nicholas Lose, MD  blood glucose meter kit and supplies KIT Dispense based on patient and insurance preference. Use up to four times daily as directed. (FOR ICD-9 250.00, 250.01). 07/19/21   Nicholas Lose, MD  famotidine (PEPCID) 20 MG tablet TAKE 1 TABLET BY MOUTH TWICE A DAY 02/13/22   Tonia Ghent, MD  gabapentin (NEURONTIN) 300 MG capsule Take 2 capsules (600 mg total) by mouth 3 (three) times daily. 02/04/22   Tonia Ghent, MD  HYDROcodone-acetaminophen (NORCO) 5-325 MG tablet Take 1-2 tablets by mouth every 6 (six) hours as needed for severe pain. Sedation caution 03/20/22   Nicholas Lose, MD  metFORMIN (GLUCOPHAGE) 500 MG tablet TAKE 1 TABLET BY MOUTH ONCE DAILY WITH MEAL 12/31/21   Tonia Ghent, MD  omeprazole (PRILOSEC) 20  MG capsule TAKE 1 CAPSULE BY MOUTH EVERY DAY 03/24/22   Tonia Ghent, MD  ondansetron (ZOFRAN) 4 MG tablet Take 1 tablet (4 mg total) by mouth every 8 (eight) hours as needed for nausea or vomiting. 12/31/21   Tonia Ghent, MD  SUMAtriptan (IMITREX) 50 MG tablet Take 1 tablet (50 mg total) by mouth every 2 (two) hours as needed for migraine (max 2 doses in 24 hours.). May repeat in 2 hours if headache persists or recurs. 03/20/22   Tonia Ghent, MD  prochlorperazine (COMPAZINE) 10 MG tablet Take 1 tablet (10 mg total) by mouth every 6 (six) hours as needed (Nausea or vomiting). 06/01/20 06/21/20  Magrinat, Virgie Dad, MD    Family History Family History  Problem Relation Age of Onset   Hypertension Mother    Diabetes Mother        insulin dependent   Heart disease Mother    Diabetes  Father    Heart disease Father        CAD   Hypertension Brother    Kidney disease Brother        kidney failure/resolved   Colon cancer Maternal Aunt        dx'd at ~62   Breast cancer Maternal Aunt 65    Social History Social History   Tobacco Use   Smoking status: Every Day    Packs/day: 2.00    Years: 31.00    Total pack years: 62.00    Types: Cigarettes   Smokeless tobacco: Never  Vaping Use   Vaping Use: Never used  Substance Use Topics   Alcohol use: No    Alcohol/week: 0.0 standard drinks of alcohol   Drug use: No     Allergies   Propoxyphene n-acetaminophen, Nabumetone, and Rofecoxib   Review of Systems Review of Systems Per HPI  Physical Exam Triage Vital Signs ED Triage Vitals  Enc Vitals Group     BP 04/07/22 1317 132/69     Pulse Rate 04/07/22 1317 85     Resp 04/07/22 1317 16     Temp 04/07/22 1317 98 F (36.7 C)     Temp Source 04/07/22 1317 Oral     SpO2 04/07/22 1317 98 %     Weight --      Height --      Head Circumference --      Peak Flow --      Pain Score 04/07/22 1314 10     Pain Loc --      Pain Edu? --      Excl. in [place]? --    No data  found.  Updated Vital Signs BP 132/69 (BP Location: Right Arm)   Pulse 85   Temp 98 F (36.7 C) (Oral)   Resp 16   SpO2 98%   Visual Acuity Right Eye Distance:   Left Eye Distance:   Bilateral Distance:    Right Eye Near:   Left Eye Near:    Bilateral Near:     Physical Exam Vitals and nursing note reviewed.  Constitutional:      Appearance: She is not ill-appearing or toxic-appearing.  HENT:     Head: Normocephalic and atraumatic. Left periorbital erythema present.     Jaw: There is normal jaw occlusion.     Comments: Left periorbital erythema and edema.  Left-sided facial droop present due to perioral swelling and dental abscess.  See image of face below for further detail.    Right Ear: Hearing, tympanic membrane, ear canal and external ear normal.     Left Ear: Hearing, tympanic membrane, ear canal and external ear normal.     Nose: Nose normal. No rhinorrhea.     Mouth/Throat:     Lips: Pink.     Mouth: Mucous membranes are moist.     Dentition: Abnormal dentition. Dental tenderness, gingival swelling and dental caries present.     Pharynx: Oropharynx is clear. Uvula midline. Posterior oropharyngeal erythema present. No pharyngeal swelling.     Tonsils: No tonsillar exudate or tonsillar abscesses. 0 on the right. 0 on the left.     Comments: Multiple missing teeth present to the left upper and lower aspect of the mouth.  Swelling and tenderness to palpation to the left generalized buccal area.  No drainage from infected dental caries or infected dental abscess.  Eyes:     General: Lids are normal. Vision grossly intact. Gaze aligned  appropriately.     Extraocular Movements: Extraocular movements intact.     Conjunctiva/sclera: Conjunctivae normal.     Pupils: Pupils are equal, round, and reactive to light.     Comments: EOMs intact without pain or dizziness elicited.  No visual field deficit.  Cardiovascular:     Rate and Rhythm: Normal rate and regular rhythm.      Heart sounds: Normal heart sounds, S1 normal and S2 normal.  Pulmonary:     Effort: Pulmonary effort is normal. No respiratory distress.     Breath sounds: Normal breath sounds and air entry.  Musculoskeletal:     Cervical back: Neck supple.  Lymphadenopathy:     Cervical: Cervical adenopathy present.  Skin:    General: Skin is warm and dry.     Capillary Refill: Capillary refill takes less than 2 seconds.     Findings: No rash.  Neurological:     General: No focal deficit present.     Mental Status: She is alert and oriented to person, place, and time. Mental status is at baseline.     Cranial Nerves: No cranial nerve deficit, dysarthria or facial asymmetry.     Sensory: No sensory deficit.     Motor: No weakness.     Coordination: Coordination normal.     Gait: Gait normal.  Psychiatric:        Mood and Affect: Mood normal.        Speech: Speech normal.        Behavior: Behavior normal.        Thought Content: Thought content normal.        Judgment: Judgment normal.      UC Treatments / Results  Labs (all labs ordered are listed, but only abnormal results are displayed) Labs Reviewed - No data to display  EKG   Radiology No results found.  Procedures Procedures (including critical care time)  Medications Ordered in UC Medications - No data to display  Initial Impression / Assessment and Plan / UC Course  I have reviewed the triage vital signs and the nursing notes.  Pertinent labs & imaging results that were available during my care of the patient were reviewed by me and considered in my medical decision making (see chart for details).   1.  Dental abscess, eye irritation, preseptal cellulitis Presentation is consistent with significant dental abscess and preseptal cellulitis.  Eye exam is stable and without deficit.  Low suspicion for periorbital cellulitis.  Suspect symptoms are fully related to dental abscess to the left maxillary aspect of the mouth.   Augmentin antibiotic twice daily for the next 7 days sent to pharmacy.  May take ibuprofen 600 mg every 6 hours as needed for dental pain and swelling.  She is tolerating fluids and food well at this time with hemodynamically stable vital signs.  Advised to call community dentist listed on paperwork to schedule appointment for soon as possible or find another dentist in the area that accept her insurance for follow-up.  Warm compresses recommended to the left face to reduce swelling and inflammation/infection.  She is agreeable with this plan.  Strict ER return precautions discussed.   Discussed physical exam and available lab work findings in clinic with patient.  Counseled patient regarding appropriate use of medications and potential side effects for all medications recommended or prescribed today. Discussed red flag signs and symptoms of worsening condition,when to call the PCP office, return to urgent care, and when to seek higher level  of care in the emergency department. Patient verbalizes understanding and agreement with plan. All questions answered. Patient discharged in stable condition.    Final Clinical Impressions(s) / UC Diagnoses   Final diagnoses:  Dental abscess  Eye irritation  Preseptal cellulitis     Discharge Instructions      Take Augmentin antibiotic twice daily for the next 7 days to treat oral dental infection that has spread to your face.  Please contact a dentist in the area as soon as possible to have this further evaluated as I believe that you likely have a dental abscess to the left side of your mouth.   The antibiotic will help with this, but you still need to find a dentist.  You may take ibuprofen 600 mg every 6 hours as needed for dental pain and swelling.  Take this medicine with a snack to avoid stomach upset.  You may call one of the community dentists listed on the sheet of paper that I gave you to attempt to get an appointment for follow-up.  If you  develop any new or worsening symptoms or do not improve in the next 2 to 3 days, please return.  If your symptoms are severe, please go to the emergency room.  Follow-up with your primary care provider for further evaluation and management of your symptoms as well as ongoing wellness visits.  I hope you feel better!    ED Prescriptions     Medication Sig Dispense Auth. Provider   ibuprofen (ADVIL) 600 MG tablet Take 1 tablet (600 mg total) by mouth every 6 (six) hours as needed. 30 tablet Talbot Grumbling, FNP   amoxicillin-clavulanate (AUGMENTIN) 875-125 MG tablet Take 1 tablet by mouth every 12 (twelve) hours. 14 tablet Talbot Grumbling, FNP      PDMP not reviewed this encounter.   Talbot Grumbling,  04/07/22 2010

## 2022-04-07 NOTE — ED Triage Notes (Signed)
Pt is here for dental and facial swelling x few day causing pain and discomfort .

## 2022-04-07 NOTE — Discharge Instructions (Addendum)
Take Augmentin antibiotic twice daily for the next 7 days to treat oral dental infection that has spread to your face.  Please contact a dentist in the area as soon as possible to have this further evaluated as I believe that you likely have a dental abscess to the left side of your mouth.   The antibiotic will help with this, but you still need to find a dentist.  You may take ibuprofen 600 mg every 6 hours as needed for dental pain and swelling.  Take this medicine with a snack to avoid stomach upset.  You may call one of the community dentists listed on the sheet of paper that I gave you to attempt to get an appointment for follow-up.  If you develop any new or worsening symptoms or do not improve in the next 2 to 3 days, please return.  If your symptoms are severe, please go to the emergency room.  Follow-up with your primary care provider for further evaluation and management of your symptoms as well as ongoing wellness visits.  I hope you feel better!

## 2022-04-10 ENCOUNTER — Other Ambulatory Visit: Payer: Self-pay | Admitting: Hematology and Oncology

## 2022-04-10 MED ORDER — HYDROCODONE-ACETAMINOPHEN 5-325 MG PO TABS: 1.0000 | ORAL_TABLET | Freq: Four times a day (QID) | ORAL | 0 refills | Status: DC | PRN

## 2022-04-10 NOTE — Telephone Encounter (Signed)
Please deny this, you last filled it on 12/14 and this is too early.

## 2022-04-22 ENCOUNTER — Other Ambulatory Visit: Payer: Self-pay | Admitting: Hematology and Oncology

## 2022-04-22 ENCOUNTER — Other Ambulatory Visit: Payer: Self-pay | Admitting: Family Medicine

## 2022-04-23 ENCOUNTER — Other Ambulatory Visit: Payer: Self-pay | Admitting: *Deleted

## 2022-04-23 ENCOUNTER — Other Ambulatory Visit: Payer: Self-pay | Admitting: Family Medicine

## 2022-04-23 MED ORDER — ONDANSETRON HCL 4 MG PO TABS: 4.0000 mg | ORAL_TABLET | Freq: Three times a day (TID) | ORAL | 0 refills | Status: DC | PRN

## 2022-04-23 NOTE — Telephone Encounter (Signed)
Please refuse these as well, I contact the pharmacy to stop sending 4 refill requests a day.  We just filled last week.

## 2022-04-23 NOTE — Telephone Encounter (Signed)
Please deny this, you filled 1 week ago and I don't know why pharmacy sent it twice.

## 2022-05-06 ENCOUNTER — Other Ambulatory Visit: Payer: Self-pay | Admitting: Hematology and Oncology

## 2022-05-10 ENCOUNTER — Other Ambulatory Visit: Payer: Self-pay | Admitting: Hematology and Oncology

## 2022-05-12 ENCOUNTER — Other Ambulatory Visit: Payer: Self-pay | Admitting: Hematology and Oncology

## 2022-05-12 ENCOUNTER — Telehealth: Payer: Self-pay

## 2022-05-12 ENCOUNTER — Other Ambulatory Visit: Payer: Self-pay | Admitting: Family Medicine

## 2022-05-12 MED ORDER — HYDROCODONE-ACETAMINOPHEN 5-325 MG PO TABS: 1.0000 | ORAL_TABLET | Freq: Four times a day (QID) | ORAL | 0 refills | Status: DC | PRN

## 2022-05-12 NOTE — Telephone Encounter (Signed)
Notified Patient by voicemail of prior authorization approval for Hydrocodone 5/'325mg'$  Tablets. Medication is approved through 11/08/2022.

## 2022-05-15 ENCOUNTER — Other Ambulatory Visit: Payer: Self-pay | Admitting: *Deleted

## 2022-05-15 DIAGNOSIS — Z170 Estrogen receptor positive status [ER+]: Secondary | ICD-10-CM

## 2022-05-16 NOTE — Progress Notes (Signed)
Patient Care Team: Tonia Ghent, MD as PCP - General (Family Medicine) [name], RN as Registered Nurse [name], RN as Registered Nurse [name], RN as Oncology Nurse Navigator [name], RN as Oncology Nurse Navigator Kyung Rudd, MD as Consulting Physician (Radiation Oncology) Nicholas Lose, MD as Consulting Physician (Hematology and Oncology) Jovita Kussmaul, MD as Consulting Physician (General Surgery)  DIAGNOSIS: No diagnosis found.  SUMMARY OF ONCOLOGIC HISTORY: Oncology History  Malignant neoplasm of upper-outer quadrant of left breast in female, estrogen receptor positive ([place])  08/25/2019 Initial Diagnosis   Palpable left breast mass x1 month. Diagnostic mammogram and [place] on 08/18/19 showed a 2.8cm mass at the 2 o'clock position, and one lymph node with mild cortical thickening in the left axilla. 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   10/18/2019 Cancer Staging   Staging form: Breast, AJCC 8th Edition - Pathologic stage from 10/18/2019: Stage IB (pT2, pN0(sn), cM0, G3, ER+, PR+, HER2-)   10/21/2019 Oncotype testing   The Oncotype DX score was 32 predicting a risk of outside the breast recurrence over the next 9 years of 20% if the patient's only systemic therapy is tamoxifen for 5 years.    11/15/2019 - 03/27/2020 Chemotherapy   Adjuvant chemotherapy with dose dense Adriamycin and Cytoxan x4 (11/15/2019 - 12/27/2019) followed by Taxol weekly x12 (01/09/2020 - 03/27/20).   04/25/2020 - 05/25/2020 Radiation Therapy   The patient initially received a dose of 42.56 Gy in 16 fractions to the breast using whole-breast tangent fields. This was delivered using a  3-D conformal technique. The pt received a boost delivering an additional 8 Gy in 4 fractions using a electron boost with 61mV electrons. The total dose was 50.56 Gy.   05/2020 -  Anti-estrogen oral therapy   Anastrozole   05/06/2021 Surgery   Left mastectomy: Benign (performed because of left breast fibrosis and pain)     CHIEF COMPLIANT: left breast cancer/ discuss pain management  INTERVAL HISTORY: Julia KITZINGERis a 57y.o. with above-mentioned history of left breast cancer treated with lumpectomy, adjuvant chemotherapy, radiation, and is currently on antiestrogen therapy with anastrozole. She presents to the clinic today for a telephone follow-up.     ALLERGIES:  is allergic to propoxyphene n-acetaminophen, nabumetone, and rofecoxib.  MEDICATIONS:  Current Outpatient Medications  Medication Sig Dispense Refill   ACCU-CHEK GUIDE test strip USE TO TEST BLOOD SUGAR UP TO 4 TIMES A DAY AS DIRECTED 100 strip 3   Accu-Chek Softclix Lancets lancets Check sugar once daily. 100 each 5   albuterol (PROVENTIL) (2.5 MG/3ML) 0.083% nebulizer solution Take 3 mLs (2.5 mg total) by nebulization every 6 (six) hours as needed for wheezing or shortness of breath. 75 mL 1   albuterol (VENTOLIN HFA) 108 (90 Base) MCG/ACT inhaler Inhale 2 puffs into the lungs every 6 (six) hours as needed for wheezing or shortness of breath. 18 each 3   amoxicillin-clavulanate (AUGMENTIN) 875-125 MG tablet Take 1 tablet by mouth every 12 (twelve) hours. 14 tablet 0   anastrozole (ARIMIDEX) 1 MG tablet TAKE 1 TABLET BY MOUTH EVERY DAY 90 tablet 3   blood glucose meter kit and supplies KIT Dispense  based on patient and insurance preference. Use up to four times daily as directed. (FOR ICD-9 250.00, 250.01). 1 each 0   famotidine (PEPCID) 20 MG tablet TAKE 1 TABLET BY MOUTH TWICE A DAY 180 tablet 1   gabapentin (NEURONTIN) 300 MG capsule Take 2 capsules (600 mg total) by mouth 3 (three) times daily. 180 capsule 1    HYDROcodone-acetaminophen (NORCO) 5-325 MG tablet Take 1-2 tablets by mouth every 6 (six) hours as needed for severe pain. Sedation caution 60 tablet 0   ibuprofen (ADVIL) 600 MG tablet Take 1 tablet (600 mg total) by mouth every 6 (six) hours as needed. 30 tablet 0   metFORMIN (GLUCOPHAGE) 500 MG tablet TAKE 1 TABLET BY MOUTH ONCE DAILY WITH MEAL 90 tablet 1   omeprazole (PRILOSEC) 20 MG capsule TAKE 1 CAPSULE BY MOUTH EVERY OTHER DAY 90 capsule 1   ondansetron (ZOFRAN) 4 MG tablet Take 1 tablet (4 mg total) by mouth every 8 (eight) hours as needed for nausea or vomiting. 20 tablet 0   SUMAtriptan (IMITREX) 50 MG tablet Take 1 tablet (50 mg total) by mouth every 2 (two) hours as needed for migraine (max 2 doses in 24 hours.). May repeat in 2 hours if headache persists or recurs. 10 tablet 2   No current facility-administered medications for this visit.    PHYSICAL EXAMINATION: ECOG PERFORMANCE STATUS: {CHL ONC ECOG PS:[phone]}  There were no vitals filed for this visit. There were no vitals filed for this visit.  BREAST:*** No palpable masses or nodules in either right or left breasts. No palpable axillary supraclavicular or infraclavicular adenopathy no breast tenderness or nipple discharge. (exam performed in the presence of a chaperone)  LABORATORY DATA:  I have reviewed the data as listed    Latest Ref Rng & Units 12/31/2021    3:38 PM 11/06/2021    9:03 AM 10/22/2021   10:27 AM  CMP  Glucose 70 - 99 mg/dL 105  119  110   BUN 6 - 23 mg/dL 9  11  9   $ Creatinine 0.40 - 1.20 mg/dL 0.70  0.65  0.57   Sodium 135 - 145 mEq/L 139  136  138   Potassium 3.5 - 5.1 mEq/L 4.0  4.2  4.3   Chloride 96 - 112 mEq/L 104  101  108   CO2 19 - 32 mEq/L 27  25  25   $ Calcium 8.4 - 10.5 mg/dL 9.6  9.5  10.0   Total Protein 6.0 - 8.3 g/dL 7.2  7.6  7.6   Total Bilirubin 0.2 - 1.2 mg/dL 0.4  0.7  0.6   Alkaline Phos 39 - 117 U/L 66  76  68   AST 0 - 37 U/L 15  24  14   $ ALT 0 - 35 U/L 14  21  13      $ Lab Results  Component Value Date   WBC 7.9 12/31/2021   HGB 15.1 (H) 12/31/2021   HCT 43.3 12/31/2021   MCV 94.0 12/31/2021   PLT 263.0 12/31/2021   NEUTROABS 5.0 12/31/2021    ASSESSMENT & PLAN:  No problem-specific Assessment & Plan notes found for this encounter.    No orders of the defined types were placed in this encounter.  The patient has a good understanding of the overall plan. she agrees with it. she will call with any problems that may develop before the next visit here. Total time spent: 30 mins including face to face time and  time spent for planning, charting and co-ordination of care   Suzzette Righter, [place] 05/16/22    I Gardiner Coins am acting as a Education administrator for [employer]  ***

## 2022-05-17 NOTE — Assessment & Plan Note (Signed)
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  ER 90%, PR 90%, HER-2 negative by FISH Posterior margin positive but no further surgery done because it is muscle on the back. T2N0 stage Ib Oncotype DX score 32: 20% risk of distant recurrence in 9 years   Treatment plan:  1. Adjuvant chemotherapy with dose dense Adriamycin and Cytoxan x4 followed by Taxol weekly x12 completed 03/27/21 2. followed by radiation therapy started 04/26/20 3.  Followed by adjuvant antiestrogen therapy.  Started February 2022 ------------------------------------------------------------------------------------------------------------------------------------------------- Anastrozole toxicities: She is tolerating it extremely well. Osteoporosis: Prolia every 6 months beginning 8/1/202   Left breast pain: Left mastectomy 04/29/2021: Benign.  We referred her to physical therapy for dry needling    Severe left chest wall pain: In spite of taking pain medications and gabapentin. Pain persisted in spite of increasing gabapentin dose to 600 p.o. 3 times daily   Breast cancer surveillance:  Right breast mammogram 10/14/2021: Benign, breast density category B Osteoporosis: Every 6 months Prolia injections   She's considering disability because shes having difficulty doing work because of intractable pain in chest   Abdominal pain: Diffuse and constant. CT abdomen and pelvis 12/04/21: Gastritis, multiple lung nodules 4 mm  RTC in 1 year

## 2022-05-19 ENCOUNTER — Inpatient Hospital Stay: Payer: Medicaid Other

## 2022-05-19 ENCOUNTER — Inpatient Hospital Stay: Payer: Medicaid Other | Attending: Hematology and Oncology

## 2022-05-19 ENCOUNTER — Telehealth: Payer: Self-pay | Admitting: Family Medicine

## 2022-05-19 ENCOUNTER — Inpatient Hospital Stay (HOSPITAL_BASED_OUTPATIENT_CLINIC_OR_DEPARTMENT_OTHER): Payer: Medicaid Other | Admitting: Hematology and Oncology

## 2022-05-19 VITALS — BP 147/67 | HR 71 | Temp 97.6°F | Resp 16 | Wt 137.2 lb

## 2022-05-19 DIAGNOSIS — C50412 Malignant neoplasm of upper-outer quadrant of left female breast: Secondary | ICD-10-CM

## 2022-05-19 DIAGNOSIS — Z79811 Long term (current) use of aromatase inhibitors: Secondary | ICD-10-CM | POA: Insufficient documentation

## 2022-05-19 DIAGNOSIS — Z9221 Personal history of antineoplastic chemotherapy: Secondary | ICD-10-CM | POA: Diagnosis not present

## 2022-05-19 DIAGNOSIS — Z170 Estrogen receptor positive status [ER+]: Secondary | ICD-10-CM

## 2022-05-19 DIAGNOSIS — Z923 Personal history of irradiation: Secondary | ICD-10-CM | POA: Insufficient documentation

## 2022-05-19 DIAGNOSIS — M810 Age-related osteoporosis without current pathological fracture: Secondary | ICD-10-CM | POA: Diagnosis not present

## 2022-05-19 DIAGNOSIS — M816 Localized osteoporosis [Lequesne]: Secondary | ICD-10-CM

## 2022-05-19 DIAGNOSIS — Z9012 Acquired absence of left breast and nipple: Secondary | ICD-10-CM | POA: Diagnosis not present

## 2022-05-19 DIAGNOSIS — Z79899 Other long term (current) drug therapy: Secondary | ICD-10-CM | POA: Insufficient documentation

## 2022-05-19 DIAGNOSIS — Z95828 Presence of other vascular implants and grafts: Secondary | ICD-10-CM

## 2022-05-19 LAB — CBC WITH DIFFERENTIAL (CANCER CENTER ONLY)
Abs Immature Granulocytes: 0.03 10*3/uL (ref 0.00–0.07)
Basophils Absolute: 0 10*3/uL (ref 0.0–0.1)
Basophils Relative: 0 %
Eosinophils Absolute: 0.3 10*3/uL (ref 0.0–0.5)
Eosinophils Relative: 3 %
HCT: 43.1 % (ref 36.0–46.0)
Hemoglobin: 15.3 g/dL — ABNORMAL HIGH (ref 12.0–15.0)
Immature Granulocytes: 0 %
Lymphocytes Relative: 17 %
Lymphs Abs: 1.8 10*3/uL (ref 0.7–4.0)
MCH: 32.8 pg (ref 26.0–34.0)
MCHC: 35.5 g/dL (ref 30.0–36.0)
MCV: 92.3 fL (ref 80.0–100.0)
Monocytes Absolute: 0.6 10*3/uL (ref 0.1–1.0)
Monocytes Relative: 6 %
Neutro Abs: 7.9 10*3/uL — ABNORMAL HIGH (ref 1.7–7.7)
Neutrophils Relative %: 74 %
Platelet Count: 229 10*3/uL (ref 150–400)
RBC: 4.67 MIL/uL (ref 3.87–5.11)
RDW: 12.5 % (ref 11.5–15.5)
WBC Count: 10.6 10*3/uL — ABNORMAL HIGH (ref 4.0–10.5)
nRBC: 0 % (ref 0.0–0.2)

## 2022-05-19 LAB — CMP (CANCER CENTER ONLY)
ALT: 11 U/L (ref 0–44)
AST: 13 U/L — ABNORMAL LOW (ref 15–41)
Albumin: 4.2 g/dL (ref 3.5–5.0)
Alkaline Phosphatase: 85 U/L (ref 38–126)
Anion gap: 7 (ref 5–15)
BUN: 10 mg/dL (ref 6–20)
CO2: 26 mmol/L (ref 22–32)
Calcium: 10.1 mg/dL (ref 8.9–10.3)
Chloride: 105 mmol/L (ref 98–111)
Creatinine: 0.62 mg/dL (ref 0.44–1.00)
GFR, Estimated: >60 mL/min (ref >60)
Glucose, Bld: 124 mg/dL — ABNORMAL HIGH (ref 70–99)
Potassium: 3.9 mmol/L (ref 3.5–5.1)
Sodium: 138 mmol/L (ref 135–145)
Total Bilirubin: 0.6 mg/dL (ref 0.3–1.2)
Total Protein: 7.1 g/dL (ref 6.5–8.1)

## 2022-05-19 MED ORDER — DENOSUMAB 60 MG/ML ~~LOC~~ SOSY
60.0000 mg | PREFILLED_SYRINGE | Freq: Once | SUBCUTANEOUS | Status: AC
  Administered 2022-05-19: 60 mg via SUBCUTANEOUS
  Filled 2022-05-19: qty 1

## 2022-05-19 MED ORDER — RADIAPLEXRX EX GEL: 1.0000 | Freq: Every day | CUTANEOUS | 3 refills | Status: DC

## 2022-05-19 NOTE — Telephone Encounter (Signed)
Please check with patient about abdominal pain.  I saw her hematology note.   I don't recall recent discussion about seeing GI.  If still having symptoms, then let me know and offer eval here. Thanks.

## 2022-05-19 NOTE — Patient Instructions (Signed)
Denosumab Injection (Osteoporosis) What is this medication? DENOSUMAB (den oh SUE mab) prevents and treats osteoporosis. It works by making your bones stronger and less likely to break (fracture). It is a monoclonal antibody. This medicine may be used for other purposes; ask your health care provider or pharmacist if you have questions. COMMON BRAND NAME(S): Prolia What should I tell my care team before I take this medication? They need to know if you have any of these conditions: Dental or gum disease, or plan to have dental surgery or a tooth pulled Infection Kidney disease Low levels of calcium or vitamin D in your blood On dialysis Poor nutrition Skin conditions Thyroid disease, or have had thyroid or parathyroid surgery Trouble absorbing minerals in your stomach or intestine An unusual or allergic reaction to denosumab, other medications, foods, dyes, or preservatives Pregnant or trying to get pregnant Breastfeeding How should I use this medication? This medication is injected under the skin. It is given by your care team in a hospital or clinic setting. A special MedGuide will be given to you before each treatment. Be sure to read this information carefully each time. Talk to your care team about the use of this medication in children. Special care may be needed. Overdosage: If you think you have taken too much of this medicine contact a poison control center or emergency room at once. NOTE: This medicine is only for you. Do not share this medicine with others. What if I miss a dose? Keep appointments for follow-up doses. It is important not to miss your dose. Call your care team if you are unable to keep an appointment. What may interact with this medication? Do not take this medication with any of the following: Other medications that contain denosumab This medication may also interact with the following: Medications that lower your chance of fighting infection Steroid  medications, such as prednisone or cortisone This list may not describe all possible interactions. Give your health care provider a list of all the medicines, herbs, non-prescription drugs, or dietary supplements you use. Also tell them if you smoke, drink alcohol, or use illegal drugs. Some items may interact with your medicine. What should I watch for while using this medication? Your condition will be monitored carefully while you are receiving this medication. You may need blood work while taking this medication. This medication may increase your risk of getting an infection. Call your care team for advice if you get a fever, chills, sore throat, or other symptoms of a cold or flu. Do not treat yourself. Try to avoid being around people who are sick. Tell your dentist and dental surgeon that you are taking this medication. You should not have major dental surgery while on this medication. See your dentist to have a dental exam and fix any dental problems before starting this medication. Take good care of your teeth while on this medication. Make sure you see your dentist for regular follow-up appointments. You should make sure you get enough calcium and vitamin D while you are taking this medication. Discuss the foods you eat and the vitamins you take with your care team. Talk to your care team if you are pregnant or think you might be pregnant. This medication can cause serious birth defects if taken during pregnancy and for 5 months after the last dose. You will need a negative pregnancy test before starting this medication. Contraception is recommended while taking this medication and for 5 months after the last dose. Your care   team can help you find the option that works for you. Talk to your care team before breastfeeding. Changes to your treatment plan may be needed. What side effects may I notice from receiving this medication? Side effects that you should report to your care team as soon as  possible: Allergic reactions--skin rash, itching, hives, swelling of the face, lips, tongue, or throat Infection--fever, chills, cough, sore throat, wounds that don't heal, pain or trouble when passing urine, general feeling of discomfort or being unwell Low calcium level--muscle pain or cramps, confusion, tingling, or numbness in the hands or feet Osteonecrosis of the jaw--pain, swelling, or redness in the mouth, numbness of the jaw, poor healing after dental work, unusual discharge from the mouth, visible bones in the mouth Severe bone, joint, or muscle pain Skin infection--skin redness, swelling, warmth, or pain Side effects that usually do not require medical attention (report these to your care team if they continue or are bothersome): Back pain Headache Joint pain Muscle pain Pain in the hands, arms, legs, or feet Runny or stuffy nose Sore throat This list may not describe all possible side effects. Call your doctor for medical advice about side effects. You may report side effects to FDA at 1-800-FDA-1088. Where should I keep my medication? This medication is given in a hospital or clinic. It will not be stored at home. NOTE: This sheet is a summary. It may not cover all possible information. If you have questions about this medicine, talk to your doctor, pharmacist, or health care provider.  2023 Elsevier/Gold Standard (2021-08-05 00:00:00)  

## 2022-05-20 ENCOUNTER — Other Ambulatory Visit: Payer: Self-pay | Admitting: Family Medicine

## 2022-05-21 NOTE — Telephone Encounter (Signed)
Spoke with patient and she states she is doing much better now. She said her medications have been changed up and that really helped. I advised patient to let us know if she experiences this again.

## 2022-05-21 NOTE — Telephone Encounter (Signed)
Noted. Thanks. In that case I'll defer to patient.

## 2022-05-22 ENCOUNTER — Telehealth: Payer: Self-pay | Admitting: Hematology and Oncology

## 2022-05-22 NOTE — Telephone Encounter (Signed)
Scheduled appointment per los. Patient is aware of all made appointments.

## 2022-05-23 ENCOUNTER — Telehealth: Payer: Self-pay

## 2022-05-23 ENCOUNTER — Other Ambulatory Visit: Payer: Self-pay | Admitting: Hematology and Oncology

## 2022-05-23 NOTE — Telephone Encounter (Signed)
Called Pt regarding [facility]. Called [facility] who stated that rx is on backorder and unavailable within 20 mile radius. [place] who stated rx is unavailable but seems to be available on [employer]. Called Pt and gave above information. Discussed with Pt that she could purchase rx on [employer] or use voltaren gel until rx is back in stock. Pt verbalized understanding.

## 2022-06-02 ENCOUNTER — Other Ambulatory Visit: Payer: Self-pay | Admitting: Hematology and Oncology

## 2022-06-02 MED ORDER — HYDROCODONE-ACETAMINOPHEN 5-325 MG PO TABS: 1.0000 | ORAL_TABLET | Freq: Four times a day (QID) | ORAL | 0 refills | Status: DC | PRN

## 2022-06-03 ENCOUNTER — Other Ambulatory Visit: Payer: Self-pay | Admitting: Hematology and Oncology

## 2022-06-04 NOTE — Telephone Encounter (Signed)
REFUSE please, you just filled 2 days ago.

## 2022-07-01 ENCOUNTER — Other Ambulatory Visit: Payer: Self-pay | Admitting: Hematology and Oncology

## 2022-07-07 MED ORDER — HYDROCODONE-ACETAMINOPHEN 5-325 MG PO TABS: 1.0000 | ORAL_TABLET | Freq: Four times a day (QID) | ORAL | 0 refills | Status: DC | PRN

## 2022-07-27 ENCOUNTER — Other Ambulatory Visit: Payer: Self-pay | Admitting: Hematology and Oncology

## 2022-07-28 MED ORDER — HYDROCODONE-ACETAMINOPHEN 5-325 MG PO TABS: 1.0000 | ORAL_TABLET | Freq: Four times a day (QID) | ORAL | 0 refills | Status: DC | PRN

## 2022-07-28 NOTE — Telephone Encounter (Signed)
Do you want to continue filling this?

## 2022-07-29 ENCOUNTER — Telehealth: Payer: Self-pay

## 2022-07-29 NOTE — Telephone Encounter (Signed)
Notified Patient of prior authorization approval for Hydrocodone 5/325 mg Tablets. Medication is approved through 07/29/2023. No other needs or concerns voiced at this time.

## 2022-08-05 DIAGNOSIS — M5137 Other intervertebral disc degeneration, lumbosacral region: Secondary | ICD-10-CM | POA: Diagnosis not present

## 2022-08-05 DIAGNOSIS — M5416 Radiculopathy, lumbar region: Secondary | ICD-10-CM | POA: Diagnosis not present

## 2022-08-13 DIAGNOSIS — C50912 Malignant neoplasm of unspecified site of left female breast: Secondary | ICD-10-CM | POA: Diagnosis not present

## 2022-08-14 ENCOUNTER — Other Ambulatory Visit: Payer: Self-pay | Admitting: Family Medicine

## 2022-08-23 ENCOUNTER — Other Ambulatory Visit: Payer: Self-pay | Admitting: Hematology and Oncology

## 2022-08-25 MED ORDER — HYDROCODONE-ACETAMINOPHEN 5-325 MG PO TABS: 1.0000 | ORAL_TABLET | Freq: Four times a day (QID) | ORAL | 0 refills | Status: DC | PRN

## 2022-08-26 ENCOUNTER — Other Ambulatory Visit: Payer: Self-pay | Admitting: Hematology and Oncology

## 2022-08-27 MED ORDER — HYDROCODONE-ACETAMINOPHEN 5-325 MG PO TABS: 1.0000 | ORAL_TABLET | Freq: Four times a day (QID) | ORAL | 0 refills | Status: DC | PRN

## 2022-08-27 MED ORDER — BLOOD GLUCOSE MONITOR KIT: PACK | 0 refills | Status: DC

## 2022-08-27 NOTE — Telephone Encounter (Signed)
Please refuse these.  You just filled them on 08/25/22

## 2022-09-09 ENCOUNTER — Telehealth: Payer: Self-pay | Admitting: *Deleted

## 2022-09-09 NOTE — Telephone Encounter (Signed)
Received VM from pt requesting advice as to where she received lymphedema physical therapy.  Pt states she is being treated at [facility] for chronic back pain and is needing physical therapy for her back.  RN attempt x1 to return call to pt.  No answer, LVM stating pt was seen with [facility] and pt can contact them at [phone].  Also instructed pt that [facility] would need to be the ones to place the referral for the chronic back pain as we are not treating pt for this condition.

## 2022-09-22 ENCOUNTER — Other Ambulatory Visit: Payer: Self-pay | Admitting: Family Medicine

## 2022-09-26 ENCOUNTER — Other Ambulatory Visit: Payer: Self-pay | Admitting: Hematology and Oncology

## 2022-09-29 MED ORDER — HYDROCODONE-ACETAMINOPHEN 5-325 MG PO TABS: 1.0000 | ORAL_TABLET | Freq: Four times a day (QID) | ORAL | 0 refills | Status: DC | PRN

## 2022-09-30 ENCOUNTER — Other Ambulatory Visit: Payer: Self-pay | Admitting: Family Medicine

## 2022-09-30 ENCOUNTER — Other Ambulatory Visit: Payer: Self-pay | Admitting: Hematology and Oncology

## 2022-10-01 ENCOUNTER — Other Ambulatory Visit: Payer: Self-pay | Admitting: Hematology and Oncology

## 2022-10-01 NOTE — Telephone Encounter (Signed)
Patient due for diabetes f/u; please call to schedule appt.

## 2022-10-02 ENCOUNTER — Other Ambulatory Visit: Payer: Self-pay | Admitting: Family Medicine

## 2022-10-02 MED ORDER — METFORMIN HCL 500 MG PO TABS: 500.0000 mg | ORAL_TABLET | Freq: Every day | ORAL | 0 refills | Status: DC

## 2022-10-02 MED ORDER — HYDROCODONE-ACETAMINOPHEN 5-325 MG PO TABS: 1.0000 | ORAL_TABLET | Freq: Four times a day (QID) | ORAL | 0 refills | Status: DC | PRN

## 2022-10-02 NOTE — Telephone Encounter (Signed)
Spoke to pt, scheduled f/u for 10/07/22

## 2022-10-07 ENCOUNTER — Ambulatory Visit: Payer: Medicaid Other | Admitting: Family Medicine

## 2022-10-08 ENCOUNTER — Encounter: Payer: Self-pay | Admitting: Family Medicine

## 2022-10-11 ENCOUNTER — Encounter: Payer: Self-pay | Admitting: Adult Health

## 2022-10-22 DIAGNOSIS — M5416 Radiculopathy, lumbar region: Secondary | ICD-10-CM | POA: Diagnosis not present

## 2022-11-03 ENCOUNTER — Other Ambulatory Visit: Payer: Self-pay | Admitting: Hematology and Oncology

## 2022-11-06 ENCOUNTER — Encounter: Payer: Self-pay | Admitting: Adult Health

## 2022-11-07 ENCOUNTER — Telehealth: Payer: Self-pay

## 2022-11-07 NOTE — Telephone Encounter (Signed)
Mrs Maillet called with concern for a new dime-sized lump to her right neck, close to her collaar bone. She denies erythema or pain; states it is moveable. Hx of (L) BRCA 2021, tx with AC then taxol, rxt and left mastectomy in 2023. Currently taking Anastrozole and Prolia injections. Message sent to [facility] and her supportive RN. Awaiting reply.   S/w pt for update and advised pt I would reach back out Monday morning regarding an appt. She verbalized thanks and understanding.

## 2022-11-09 ENCOUNTER — Other Ambulatory Visit: Payer: Self-pay | Admitting: Hematology and Oncology

## 2022-11-10 ENCOUNTER — Inpatient Hospital Stay: Payer: 59 | Attending: Physician Assistant | Admitting: Physician Assistant

## 2022-11-10 ENCOUNTER — Other Ambulatory Visit: Payer: Self-pay

## 2022-11-10 ENCOUNTER — Telehealth: Payer: Self-pay

## 2022-11-10 VITALS — BP 156/73 | HR 77 | Temp 98.1°F | Resp 16 | Wt 131.2 lb

## 2022-11-10 DIAGNOSIS — F17210 Nicotine dependence, cigarettes, uncomplicated: Secondary | ICD-10-CM | POA: Diagnosis not present

## 2022-11-10 DIAGNOSIS — Z9221 Personal history of antineoplastic chemotherapy: Secondary | ICD-10-CM | POA: Insufficient documentation

## 2022-11-10 DIAGNOSIS — Z7984 Long term (current) use of oral hypoglycemic drugs: Secondary | ICD-10-CM | POA: Diagnosis not present

## 2022-11-10 DIAGNOSIS — Z803 Family history of malignant neoplasm of breast: Secondary | ICD-10-CM | POA: Diagnosis not present

## 2022-11-10 DIAGNOSIS — C50412 Malignant neoplasm of upper-outer quadrant of left female breast: Secondary | ICD-10-CM | POA: Insufficient documentation

## 2022-11-10 DIAGNOSIS — Z923 Personal history of irradiation: Secondary | ICD-10-CM | POA: Insufficient documentation

## 2022-11-10 DIAGNOSIS — Z800 Family history of malignant neoplasm of digestive organs: Secondary | ICD-10-CM | POA: Insufficient documentation

## 2022-11-10 DIAGNOSIS — Z9012 Acquired absence of left breast and nipple: Secondary | ICD-10-CM | POA: Insufficient documentation

## 2022-11-10 DIAGNOSIS — Z170 Estrogen receptor positive status [ER+]: Secondary | ICD-10-CM | POA: Insufficient documentation

## 2022-11-10 DIAGNOSIS — R634 Abnormal weight loss: Secondary | ICD-10-CM | POA: Insufficient documentation

## 2022-11-10 DIAGNOSIS — K219 Gastro-esophageal reflux disease without esophagitis: Secondary | ICD-10-CM | POA: Diagnosis not present

## 2022-11-10 DIAGNOSIS — Z79899 Other long term (current) drug therapy: Secondary | ICD-10-CM | POA: Insufficient documentation

## 2022-11-10 DIAGNOSIS — R7989 Other specified abnormal findings of blood chemistry: Secondary | ICD-10-CM | POA: Diagnosis not present

## 2022-11-10 DIAGNOSIS — E119 Type 2 diabetes mellitus without complications: Secondary | ICD-10-CM | POA: Insufficient documentation

## 2022-11-10 DIAGNOSIS — R599 Enlarged lymph nodes, unspecified: Secondary | ICD-10-CM | POA: Insufficient documentation

## 2022-11-10 DIAGNOSIS — Z79811 Long term (current) use of aromatase inhibitors: Secondary | ICD-10-CM | POA: Diagnosis not present

## 2022-11-10 LAB — CBC WITH DIFFERENTIAL (CANCER CENTER ONLY)
Abs Immature Granulocytes: 0.02 10*3/uL (ref 0.00–0.07)
Basophils Absolute: 0 10*3/uL (ref 0.0–0.1)
Basophils Relative: 1 %
Eosinophils Absolute: 0.2 10*3/uL (ref 0.0–0.5)
Eosinophils Relative: 3 %
HCT: 41.9 % (ref 36.0–46.0)
Hemoglobin: 14.9 g/dL (ref 12.0–15.0)
Immature Granulocytes: 0 %
Lymphocytes Relative: 22 %
Lymphs Abs: 1.6 10*3/uL (ref 0.7–4.0)
MCH: 32.7 pg (ref 26.0–34.0)
MCHC: 35.6 g/dL (ref 30.0–36.0)
MCV: 91.9 fL (ref 80.0–100.0)
Monocytes Absolute: 0.5 10*3/uL (ref 0.1–1.0)
Monocytes Relative: 7 %
Neutro Abs: 4.9 10*3/uL (ref 1.7–7.7)
Neutrophils Relative %: 67 %
Platelet Count: 264 10*3/uL (ref 150–400)
RBC: 4.56 MIL/uL (ref 3.87–5.11)
RDW: 12.7 % (ref 11.5–15.5)
WBC Count: 7.3 10*3/uL (ref 4.0–10.5)
nRBC: 0 % (ref 0.0–0.2)

## 2022-11-10 LAB — CMP (CANCER CENTER ONLY)
ALT: 7 U/L (ref 0–44)
AST: 14 U/L — ABNORMAL LOW (ref 15–41)
Albumin: 4.1 g/dL (ref 3.5–5.0)
Alkaline Phosphatase: 71 U/L (ref 38–126)
Anion gap: 5 (ref 5–15)
BUN: 8 mg/dL (ref 6–20)
CO2: 25 mmol/L (ref 22–32)
Calcium: 9.5 mg/dL (ref 8.9–10.3)
Chloride: 110 mmol/L (ref 98–111)
Creatinine: 0.68 mg/dL (ref 0.44–1.00)
GFR, Estimated: >60 mL/min (ref >60)
Glucose, Bld: 89 mg/dL (ref 70–99)
Potassium: 4.1 mmol/L (ref 3.5–5.1)
Sodium: 140 mmol/L (ref 135–145)
Total Bilirubin: 0.4 mg/dL (ref 0.3–1.2)
Total Protein: 6.8 g/dL (ref 6.5–8.1)

## 2022-11-10 NOTE — Patient Instructions (Signed)
Once we hear that the CT scan of your neck is approved we will call to let you know. After that you can schedule it by calling [phone].   Once I have the results from the CT scan I will call you to discuss.  [facility]. The office should be contacting you to schedule it. If you do not hear from them you can call [phone]

## 2022-11-10 NOTE — Telephone Encounter (Signed)
Pt was offered [facility] appt for today at 1:45 per below phone encounter. Pt accepted. Appt made and [facility] RN aware.

## 2022-11-10 NOTE — Progress Notes (Signed)
Symptom Management Consult Note [facility]    Patient Care Team: Julia Nam, MD as PCP - General (Family Medicine) Julia Like, RN as Registered Nurse Julia Like, RN as Registered Nurse Julia Proud, RN as Oncology Nurse Navigator Julia Angelica, RN as Oncology Nurse Navigator Julia Puffer, MD as Consulting Physician (Radiation Oncology) Julia Croissant, MD as Consulting Physician (Hematology and Oncology) Julia Miner, MD as Consulting Physician (General Surgery)    Name / MRN / DOB: Julia [name]  [id number]  October 01, 1965   Date of visit: 11/10/2022   Chief Complaint/Reason for visit: right neck swelling   Current Therapy: Prolia q6 months, Anastrozole   ASSESSMENT & PLAN: Patient is a 57 y.o. female  with oncologic history of Malignant neoplasm of upper-outer quadrant of left breast in female, estrogen receptor positive followed by Dr. Pamelia Hoit.  I have viewed most recent oncology note and lab work.    #Malignant neoplasm of upper-outer quadrant of left breast in female, estrogen receptor positive - Next appointment with oncologist is 05/20/23 - Was due for yearly mammogram in July 2023. Ordered placed and scheduling message sent to [facility].  #Right neck swelling -Acute onset. Exam with hard palpable right cervical lymph node, immobile, non tender. -CT neck ordered for further evaluation, concern for malignancy vs infection. No systemic symptoms so will hold off on antibiotics at this time. -CBC and CMP overall unremarkable.   Strict ED precautions discussed should symptoms worsen.    Heme/Onc History: Oncology History  Malignant neoplasm of upper-outer quadrant of left breast in female, estrogen receptor positive (HCC)  08/25/2019 Initial Diagnosis   Palpable left breast mass x1 month. Diagnostic mammogram and [place] on 08/18/19 showed a 2.8cm mass at the 2 o'clock position, and one lymph node with mild cortical thickening in  the left axilla. 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   10/18/2019 Cancer Staging   Staging form: Breast, AJCC 8th Edition - Pathologic stage from 10/18/2019: Stage IB (pT2, pN0(sn), cM0, G3, ER+, PR+, HER2-)   10/21/2019 Oncotype testing   The Oncotype DX score was 32 predicting a risk of outside the breast recurrence over the next 9 years of 20% if the patient's only systemic therapy is tamoxifen for 5 years.    11/15/2019 - 03/27/2020 Chemotherapy   Adjuvant chemotherapy with dose dense Adriamycin and Cytoxan x4 (11/15/2019 - 12/27/2019) followed by Taxol weekly x12 (01/09/2020 - 03/27/20).   04/25/2020 - 05/25/2020 Radiation Therapy   The patient initially received a dose of 42.56 Gy in 16 fractions to the breast using whole-breast tangent fields. This was delivered using a 3-D conformal technique. The pt received a boost delivering an additional 8 Gy in 4 fractions using a electron boost with electrons. The total dose was 50.56 Gy.   05/2020 -  Anti-estrogen oral therapy   Anastrozole   05/06/2021 Surgery   Left mastectomy: Benign (performed because of left breast fibrosis and pain)       Interval history-: Julia [name] is a 57 y.o. female with oncologic history as above presenting to [facility] today with chief complaint of right neck swelling. Patient present unaccompanied to clinic today.  Patient noticed a lump on the right side of her neck x 4 days ago. She states the area is hard to the touch and not tender. It has not grown in size. She denies any recent illness or URI symptoms. No difficulty breathing or swallowing. She does endorse unintentional weight loss and fatigue x several months. No  OTC medications taken for symptoms prior to arrival. Denies lymph node swelling in axillary and inguinal regions.  Patient reports she did not yet have her mammogram and  thinks it was due last month. She denies any breast pain or changes.      ROS  All other systems are reviewed and are negative for acute change except as noted in the HPI.    Allergies  Allergen Reactions   Propoxyphene N-Acetaminophen Hives   Nabumetone Rash    She can tolerate ibuprofen w/o difficulty   Rofecoxib Rash    Nightmares (vioxx)     Past Medical History:  Diagnosis Date   Acute bronchitis 04/19/2021   Anxiety    over cancer diagnosis   Asthma    bronchial asthma    Cancer (HCC) 09/2019   left breast invasive mammary cancer   Chronic back pain    COPD (chronic obstructive pulmonary disease) (HCC)    smokes 1 1/2 ppd   Diabetes mellitus without complication (HCC)    GERD (gastroesophageal reflux disease)    Personal history of chemotherapy    Personal history of radiation therapy    Pneumonia    hx of 2018    Smoking      Past Surgical History:  Procedure Laterality Date   BREAST BIOPSY Left 09/2019   x2   BREAST BIOPSY Left 11/22/2020   BREAST LUMPECTOMY Left 10/2019   BREAST LUMPECTOMY WITH SENTINEL LYMPH NODE BIOPSY Left 10/07/2019   Procedure: LEFT BREAST LUMPECTOMY WITH SENTINEL LYMPH NODE BX;  Surgeon: Julia Miner, MD;  Location: [facility];  Service: General;  Laterality: Left;  PEC BLOCK   CHOLECYSTECTOMY     PORTACATH PLACEMENT Right 11/10/2019   Procedure: INSERTION PORT-A-CATH;  Surgeon: Julia Miner, MD;  Location: [facility];  Service: General;  Laterality: Right;   TONSILLECTOMY     TOTAL MASTECTOMY Left 05/06/2021   Procedure: LEFT TOTAL MASTECTOMY;  Surgeon: Julia Miner, MD;  Location: [facility];  Service: General;  Laterality: Left;   VULVECTOMY N/A 01/30/2015   Procedure: WIDE LOCAL EXCISION VULVA;  Surgeon: Adolphus Birchwood, MD;  Location: [facility];   Service: Gynecology;  Laterality: N/A;    Social History   Socioeconomic History   Marital status: Married    Spouse name: Not on file   Number of children: 1   Years of education: 12   Highest education level: Not on file  Occupational History   Occupation: Conservation officer, nature at [employer]  Tobacco Use   Smoking status: Every Day    Current packs/day: 2.00    Average packs/day: 2.0 packs/day for 31.0 years (62.0 ttl pk-yrs)    Types: Cigarettes   Smokeless tobacco: Never  Vaping Use   Vaping status: Never Used  Substance and Sexual Activity   Alcohol use: No    Alcohol/week: 0.0 standard drinks of alcohol   Drug use: No   Sexual activity: Yes    Birth control/protection: Post-menopausal  Other Topics Concern   Not on file  Social History Narrative   One son   Married 2000 (had been together since 1984)   Social  Determinants of Health   Financial Resource Strain: Not on file  Food Insecurity: Not on file  Transportation Needs: Not on file  Physical Activity: Not on file  Stress: Not on file  Social Connections: Not on file  Intimate Partner Violence: Not on file    Family History  Problem Relation Age of Onset   Hypertension Mother    Diabetes Mother        insulin dependent   Heart disease Mother    Diabetes Father    Heart disease Father        CAD   Hypertension Brother    Kidney disease Brother        kidney failure/resolved   Colon cancer Maternal Aunt        dx'd at ~62   Breast cancer Maternal Aunt 36     Current Outpatient Medications:    ACCU-CHEK GUIDE test strip, USE TO TEST BLOOD SUGAR UP TO 4 TIMES A DAY AS DIRECTED, Disp: 100 strip, Rfl: 3   Accu-Chek Softclix Lancets lancets, Check sugar once daily., Disp: 100 each, Rfl: 5   blood glucose meter kit and supplies KIT, Dispense based on patient and insurance preference. Use up to four times daily as directed. (FOR ICD-9 250.00, 250.01)., Disp: 1 each, Rfl: 0   famotidine (PEPCID) 20 MG tablet,  TAKE 1 TABLET BY MOUTH TWICE A DAY, Disp: 180 tablet, Rfl: 1   gabapentin (NEURONTIN) 300 MG capsule, TAKE 2 CAPSULES BY MOUTH 3 TIMES DAILY. TAKE UP TO 4 CAPSULES A DAY (2 CAPSULES TWICE A DAY), Disp: 180 capsule, Rfl: 3   hyaluronate sodium (RADIAPLEXRX) GEL, APPLY 1 APPLICATION TOPICALLY DAILY, Disp: 170 g, Rfl: 3   HYDROcodone-acetaminophen (NORCO) 5-325 MG tablet, Take 1-2 tablets by mouth every 6 (six) hours as needed for severe pain. Sedation caution, Disp: 60 tablet, Rfl: 0   ibuprofen (ADVIL) 600 MG tablet, TAKE 1 TABLET (600 MG TOTAL) BY MOUTH EVERY 8 (EIGHT) HOURS AS NEEDED (FOR PAIN. TAKE WITH FOOD.)., Disp: 90 tablet, Rfl: 2   metFORMIN (GLUCOPHAGE) 500 MG tablet, Take 1 tablet (500 mg total) by mouth daily at 2 PM. TAKE 1 TABLET BY MOUTH ONCE DAILY WITH MEAL, Disp: 90 tablet, Rfl: 0   omeprazole (PRILOSEC) 20 MG capsule, TAKE 1 CAPSULE BY MOUTH EVERY OTHER DAY, Disp: 90 capsule, Rfl: 1   SUMAtriptan (IMITREX) 50 MG tablet, Take 1 tablet (50 mg total) by mouth every 2 (two) hours as needed for migraine (max 2 doses in 24 hours.). May repeat in 2 hours if headache persists or recurs., Disp: 10 tablet, Rfl: 2   tiZANidine (ZANAFLEX) 2 MG tablet, TAKE 1 TABLET BY MOUTH EVERY 8 HOURS AS NEEDED NO MORE THAN 3 DOSES IN 24 HOURS, Disp: , Rfl:    VENTOLIN HFA 108 (90 Base) MCG/ACT inhaler, TAKE 2 PUFFS BY MOUTH EVERY 6 HOURS AS NEEDED FOR WHEEZE OR SHORTNESS OF BREATH, Disp: 18 each, Rfl: 1  PHYSICAL EXAM: ECOG FS:1 - Symptomatic but completely ambulatory    Vitals:   11/10/22 1354  BP: (!) 156/73  Pulse: 77  Resp: 16  Temp: 98.1 F (36.7 C)  TempSrc: Oral  SpO2: 98%  Weight: 131 lb 3.2 oz (59.5 kg)   Physical Exam Vitals and nursing note reviewed.  Constitutional:      Appearance: She is not ill-appearing or toxic-appearing.  HENT:     Head: Normocephalic.     Mouth/Throat:     Mouth: Mucous membranes are moist.  Pharynx: Oropharynx is clear. No oropharyngeal exudate or  posterior oropharyngeal erythema.  Eyes:     Conjunctiva/sclera: Conjunctivae normal.  Cardiovascular:     Rate and Rhythm: Normal rate and regular rhythm.     Pulses: Normal pulses.     Heart sounds: Normal heart sounds.  Pulmonary:     Effort: Pulmonary effort is normal.     Breath sounds: Normal breath sounds.  Chest:     Comments: S/p left mastectomy  Right breast exam without any palpable masses. No evidence of axillary adenopathy. No evidence of any palpable masses or lumps in the right breast.  Abdominal:     General: There is no distension.  Musculoskeletal:     Cervical back: Normal range of motion.  Lymphadenopathy:     Head:     Right side of head: No submandibular adenopathy.     Left side of head: No submandibular adenopathy.     Cervical: Cervical adenopathy present.     Right cervical: Superficial cervical adenopathy present.     Left cervical: No superficial cervical adenopathy.     Upper Body:     Right upper body: No axillary adenopathy.     Left upper body: No axillary adenopathy.     Lower Body: No right inguinal adenopathy. No left inguinal adenopathy.  Skin:    General: Skin is warm and dry.  Neurological:     Mental Status: She is alert.        LABORATORY DATA: I have reviewed the data as listed    Latest Ref Rng & Units 11/10/2022    2:38 PM 05/19/2022    7:46 AM 12/31/2021    3:38 PM  CBC  WBC 4.0 - 10.5 K/uL 7.3  10.6  7.9   Hemoglobin 12.0 - 15.0 g/dL 95.6  21.3  08.6   Hematocrit 36.0 - 46.0 % 41.9  43.1  43.3   Platelets 150 - 400 K/uL 264  229  263.0         Latest Ref Rng & Units 11/10/2022    2:38 PM 05/19/2022    7:46 AM 12/31/2021    3:38 PM  CMP  Glucose 70 - 99 mg/dL 89  578  469   BUN 6 - 20 mg/dL 8  10  9    Creatinine 0.44 - 1.00 mg/dL 6.29  5.28  4.13   Sodium 135 - 145 mmol/L 140  138  139   Potassium 3.5 - 5.1 mmol/L 4.1  3.9  4.0   Chloride 98 - 111 mmol/L 110  105  104   CO2 22 - 32 mmol/L 25  26  27    Calcium 8.9 -  10.3 mg/dL 9.5  24.4  9.6   Total Protein 6.5 - 8.1 g/dL 6.8  7.1  7.2   Total Bilirubin 0.3 - 1.2 mg/dL 0.4  0.6  0.4   Alkaline Phos 38 - 126 U/L 71  85  66   AST 15 - 41 U/L 14  13  15    ALT 0 - 44 U/L 7  11  14         RADIOGRAPHIC STUDIES (from last 24 hours if applicable) I have personally reviewed the radiological images as listed and agreed with the findings in the report. No results found.      Visit Diagnosis: 1. Malignant neoplasm of upper-outer quadrant of left breast in female, estrogen receptor positive (HCC)   2. Enlarged lymph nodes      Orders  Placed This Encounter  Procedures   MM DIAG BREAST TOMO UNI RIGHT    Standing Status:   Future    Standing Expiration Date:   11/10/2023    Order Specific Question:   Reason for Exam (SYMPTOM  OR DIAGNOSIS REQUIRED)    Answer:   hx breast cancer, due for yearly MM.    Order Specific Question:   Is the patient pregnant?    Answer:   No    Order Specific Question:   Preferred imaging location?    [facility]   [facility]   CT Soft Tissue Neck W Contrast    Standing Status:   Future    Standing Expiration Date:   11/10/2023    Order Specific Question:   If indicated for the ordered procedure, I authorize the administration of contrast media per Radiology protocol    Answer:   Yes    Order Specific Question:   Does the patient have a contrast media/X-ray dye allergy?    Answer:   No    Order Specific Question:   Is patient pregnant?    Answer:   No    Order Specific Question:   Preferred imaging location?    Answer:   [facility]   CBC with Differential (Cancer Center Only)    Standing Status:   Future    Number of Occurrences:   1    Standing Expiration Date:   11/10/2023   CMP (Cancer Center only)    Standing Status:   Future    Number of Occurrences:   1    Standing Expiration Date:   11/10/2023    All questions were answered. The patient knows to call the clinic with any problems, questions or concerns.  No barriers to learning was detected.  A total of more than 30 minutes were spent on this encounter with face-to-face time and non-face-to-face time, including preparing to see the patient, ordering tests, counseling the patient and coordination of care as outlined above.    Thank you for allowing me to participate in the care of this patient.    Shanon Ace, PA-C Department of Hematology/Oncology [facility] Phone: [phone]  [phone]    11/10/2022 3:30 PM

## 2022-11-11 ENCOUNTER — Telehealth: Payer: Self-pay

## 2022-11-11 ENCOUNTER — Other Ambulatory Visit: Payer: Self-pay | Admitting: Hematology and Oncology

## 2022-11-11 MED ORDER — HYDROCODONE-ACETAMINOPHEN 5-325 MG PO TABS: 1.0000 | ORAL_TABLET | Freq: Four times a day (QID) | ORAL | 0 refills | Status: DC | PRN

## 2022-11-11 MED ORDER — BLOOD GLUCOSE MONITOR KIT: PACK | 0 refills | Status: DC

## 2022-11-11 NOTE — Telephone Encounter (Signed)
Pt called to let us know [facility] is out of stock of hydrocodone-acetaminophen. MD will re-send Rx to [facility] [address] per pt request. Called [facility] to cancel the RX that was previously sent there. Called pt and LVM advising this is what we did.

## 2022-11-11 NOTE — Telephone Encounter (Signed)
Notified Patient by voicemail of prior authorization approval for Hydrocodone 5/325 mg Tablets. Medication is approved through 05/10/2023.

## 2022-11-12 ENCOUNTER — Ambulatory Visit (HOSPITAL_COMMUNITY)
Admission: RE | Admit: 2022-11-12 | Discharge: 2022-11-12 | Disposition: A | Discharge status: Home / Self Care | Payer: 59 | Source: Ambulatory Visit | Attending: Physician Assistant | Admitting: Physician Assistant

## 2022-11-12 DIAGNOSIS — R599 Enlarged lymph nodes, unspecified: Secondary | ICD-10-CM | POA: Diagnosis not present

## 2022-11-12 MED ORDER — IOHEXOL 350 MG/ML SOLN
75.0000 mL | Freq: Once | INTRAVENOUS | Status: AC | PRN
  Administered 2022-11-12: 75 mL via INTRAVENOUS

## 2022-11-14 ENCOUNTER — Other Ambulatory Visit: Payer: Self-pay | Admitting: Physician Assistant

## 2022-11-14 ENCOUNTER — Telehealth: Payer: Self-pay | Admitting: Physician Assistant

## 2022-11-14 ENCOUNTER — Ambulatory Visit
Admission: RE | Admit: 2022-11-14 | Discharge: 2022-11-14 | Disposition: A | Discharge status: Home / Self Care | Payer: Medicaid Other | Source: Ambulatory Visit | Attending: Physician Assistant | Admitting: Physician Assistant

## 2022-11-14 ENCOUNTER — Telehealth: Payer: Self-pay

## 2022-11-14 DIAGNOSIS — Z170 Estrogen receptor positive status [ER+]: Secondary | ICD-10-CM

## 2022-11-14 DIAGNOSIS — R599 Enlarged lymph nodes, unspecified: Secondary | ICD-10-CM

## 2022-11-14 DIAGNOSIS — Z1231 Encounter for screening mammogram for malignant neoplasm of breast: Secondary | ICD-10-CM | POA: Diagnosis not present

## 2022-11-14 NOTE — Telephone Encounter (Signed)
Called Pt regarding 11/17/22 appts. Per Merry Proud with PA team, Prolia is still pending and will most likely not be approved by 08/12. Per MD, ok to delay tx until we know the status of Prolia. Appts rescheduled to 11/20/22. Pt verbalized understanding.

## 2022-11-14 NOTE — Telephone Encounter (Signed)
I notified NASHA SERVANTEZ by phone regarding CT soft tissue neck results. Scan shows 9 x 8 x 7 mm posterior level 5 lymph node on the right with slight central low density. The differential diagnosis is benign reactive nodal enlargement versus metastatic disease. There are not multiple abnormal nodes. After discussion with Dr. Pamelia Hoit plan is for [place] in 4 weeks to reassess. If >1cm would consider biopsy.  All of patient's questions were answered and she expressed understanding of the plan provided. She is scheduled for mammogram today and I wil call her with the results when available.

## 2022-11-16 ENCOUNTER — Other Ambulatory Visit: Payer: Self-pay | Admitting: Family Medicine

## 2022-11-17 ENCOUNTER — Inpatient Hospital Stay: Payer: 59

## 2022-11-17 NOTE — Telephone Encounter (Signed)
Patient no showed her appt in July. Please call patient to set up new appt.

## 2022-11-18 NOTE — Telephone Encounter (Signed)
Patient reschedule appointment

## 2022-11-20 ENCOUNTER — Telehealth: Payer: Self-pay

## 2022-11-20 ENCOUNTER — Other Ambulatory Visit: Payer: Self-pay

## 2022-11-20 ENCOUNTER — Inpatient Hospital Stay: Payer: 59

## 2022-11-20 ENCOUNTER — Encounter (INDEPENDENT_AMBULATORY_CARE_PROVIDER_SITE_OTHER): Payer: Self-pay

## 2022-11-20 ENCOUNTER — Other Ambulatory Visit: Payer: Self-pay | Admitting: Hematology and Oncology

## 2022-11-20 VITALS — BP 110/64 | HR 88 | Temp 98.3°F | Resp 16

## 2022-11-20 DIAGNOSIS — Z95828 Presence of other vascular implants and grafts: Secondary | ICD-10-CM

## 2022-11-20 DIAGNOSIS — M816 Localized osteoporosis [Lequesne]: Secondary | ICD-10-CM

## 2022-11-20 DIAGNOSIS — C50412 Malignant neoplasm of upper-outer quadrant of left female breast: Secondary | ICD-10-CM | POA: Diagnosis not present

## 2022-11-20 DIAGNOSIS — Z170 Estrogen receptor positive status [ER+]: Secondary | ICD-10-CM

## 2022-11-20 LAB — CMP (CANCER CENTER ONLY)
ALT: 8 U/L (ref 0–44)
AST: 16 U/L (ref 15–41)
Albumin: 4 g/dL (ref 3.5–5.0)
Alkaline Phosphatase: 69 U/L (ref 38–126)
Anion gap: 6 (ref 5–15)
BUN: 13 mg/dL (ref 6–20)
CO2: 26 mmol/L (ref 22–32)
Calcium: 9.1 mg/dL (ref 8.9–10.3)
Chloride: 104 mmol/L (ref 98–111)
Creatinine: 0.67 mg/dL (ref 0.44–1.00)
GFR, Estimated: >60 mL/min (ref >60)
Glucose, Bld: 155 mg/dL — ABNORMAL HIGH (ref 70–99)
Potassium: 3.8 mmol/L (ref 3.5–5.1)
Sodium: 136 mmol/L (ref 135–145)
Total Bilirubin: 0.3 mg/dL (ref 0.3–1.2)
Total Protein: 6.5 g/dL (ref 6.5–8.1)

## 2022-11-20 LAB — CBC WITH DIFFERENTIAL (CANCER CENTER ONLY)
Abs Immature Granulocytes: 0.02 10*3/uL (ref 0.00–0.07)
Basophils Absolute: 0.1 10*3/uL (ref 0.0–0.1)
Basophils Relative: 1 %
Eosinophils Absolute: 0.3 10*3/uL (ref 0.0–0.5)
Eosinophils Relative: 4 %
HCT: 39.3 % (ref 36.0–46.0)
Hemoglobin: 13.7 g/dL (ref 12.0–15.0)
Immature Granulocytes: 0 %
Lymphocytes Relative: 34 %
Lymphs Abs: 2.8 10*3/uL (ref 0.7–4.0)
MCH: 31.6 pg (ref 26.0–34.0)
MCHC: 34.9 g/dL (ref 30.0–36.0)
MCV: 90.8 fL (ref 80.0–100.0)
Monocytes Absolute: 0.6 10*3/uL (ref 0.1–1.0)
Monocytes Relative: 7 %
Neutro Abs: 4.5 10*3/uL (ref 1.7–7.7)
Neutrophils Relative %: 54 %
Platelet Count: 251 10*3/uL (ref 150–400)
RBC: 4.33 MIL/uL (ref 3.87–5.11)
RDW: 12.5 % (ref 11.5–15.5)
WBC Count: 8.3 10*3/uL (ref 4.0–10.5)
nRBC: 0 % (ref 0.0–0.2)

## 2022-11-20 MED ORDER — DENOSUMAB 60 MG/ML ~~LOC~~ SOSY
60.0000 mg | PREFILLED_SYRINGE | Freq: Once | SUBCUTANEOUS | Status: AC
  Administered 2022-11-20: 60 mg via SUBCUTANEOUS
  Filled 2022-11-20: qty 1

## 2022-11-20 NOTE — Telephone Encounter (Signed)
Called patient to go over normal mammogram results, no answer- voicemail left.

## 2022-11-28 ENCOUNTER — Ambulatory Visit: Payer: Medicaid Other | Admitting: Family Medicine

## 2022-11-30 ENCOUNTER — Other Ambulatory Visit: Payer: Self-pay | Admitting: Family Medicine

## 2022-11-30 ENCOUNTER — Other Ambulatory Visit: Payer: Self-pay | Admitting: Hematology and Oncology

## 2022-12-01 MED ORDER — TIZANIDINE HCL 2 MG PO TABS: 2.0000 mg | ORAL_TABLET | Freq: Three times a day (TID) | ORAL | 1 refills | Status: DC | PRN

## 2022-12-01 MED ORDER — HYDROCODONE-ACETAMINOPHEN 5-325 MG PO TABS: 1.0000 | ORAL_TABLET | Freq: Four times a day (QID) | ORAL | 0 refills | Status: DC | PRN

## 2022-12-01 NOTE — Telephone Encounter (Signed)
This medication was entered as historical; okay to fill?

## 2022-12-01 NOTE — Telephone Encounter (Signed)
Sent. Thanks.   

## 2022-12-03 ENCOUNTER — Other Ambulatory Visit: Payer: Self-pay | Admitting: Hematology and Oncology

## 2022-12-04 MED ORDER — HYDROCODONE-ACETAMINOPHEN 5-325 MG PO TABS: 1.0000 | ORAL_TABLET | Freq: Four times a day (QID) | ORAL | 0 refills | Status: DC | PRN

## 2022-12-08 ENCOUNTER — Other Ambulatory Visit: Payer: Self-pay | Admitting: Family Medicine

## 2022-12-08 ENCOUNTER — Other Ambulatory Visit: Payer: Self-pay | Admitting: Hematology and Oncology

## 2022-12-10 MED ORDER — HYDROCODONE-ACETAMINOPHEN 5-325 MG PO TABS: 1.0000 | ORAL_TABLET | Freq: Four times a day (QID) | ORAL | 0 refills | Status: DC | PRN

## 2022-12-10 MED ORDER — IBUPROFEN 600 MG PO TABS: 600.0000 mg | ORAL_TABLET | Freq: Three times a day (TID) | ORAL | 0 refills | Status: DC | PRN

## 2022-12-11 ENCOUNTER — Encounter: Payer: Self-pay | Admitting: Adult Health

## 2022-12-12 ENCOUNTER — Ambulatory Visit (HOSPITAL_COMMUNITY)
Admission: RE | Admit: 2022-12-12 | Discharge: 2022-12-12 | Disposition: A | Discharge status: Home / Self Care | Payer: 59 | Source: Ambulatory Visit | Attending: Physician Assistant | Admitting: Physician Assistant

## 2022-12-12 DIAGNOSIS — R599 Enlarged lymph nodes, unspecified: Secondary | ICD-10-CM | POA: Insufficient documentation

## 2022-12-15 ENCOUNTER — Ambulatory Visit: Payer: Medicaid Other | Admitting: Family Medicine

## 2022-12-17 ENCOUNTER — Telehealth: Payer: Self-pay | Admitting: Physician Assistant

## 2022-12-17 DIAGNOSIS — C50412 Malignant neoplasm of upper-outer quadrant of left female breast: Secondary | ICD-10-CM

## 2022-12-17 DIAGNOSIS — R599 Enlarged lymph nodes, unspecified: Secondary | ICD-10-CM

## 2022-12-17 NOTE — Telephone Encounter (Signed)
Called patient to discuss results of recent neck ultrasound.  Imaging shows that the lymph node has increased in size compared to CT soft tissue neck on 11/12/2022.  Recommendation is for lymph node biopsy. Patient is agreeable with this. Order has been placed to IR for biopsy. She has no additional questions at this time.  Patient's oncologist Dr. Pamelia Hoit is aware of plan and agrees.

## 2022-12-18 NOTE — Progress Notes (Unsigned)
Gilmer Mor, DO  Leodis Rains D OK for US guided biopsy of right cervical lymph node, "level 5".  [place] 9/6 CT 8/7  [address]

## 2022-12-29 NOTE — Progress Notes (Signed)
Patient for US guided Core cervical LN Biopsy on Tuesday 12/30/22, I called and spoke with the patient on the phone and gave pre-procedure instructions. Pt was made aware to be here at 12:30p and check in at the [facility]. Pt stated understanding.  Called 12/29/22

## 2022-12-30 ENCOUNTER — Ambulatory Visit
Admission: RE | Admit: 2022-12-30 | Discharge: 2022-12-30 | Disposition: A | Discharge status: Home / Self Care | Payer: 59 | Source: Ambulatory Visit | Attending: Physician Assistant | Admitting: Physician Assistant

## 2022-12-30 DIAGNOSIS — C770 Secondary and unspecified malignant neoplasm of lymph nodes of head, face and neck: Secondary | ICD-10-CM | POA: Diagnosis not present

## 2022-12-30 DIAGNOSIS — Z170 Estrogen receptor positive status [ER+]: Secondary | ICD-10-CM

## 2022-12-30 DIAGNOSIS — Z853 Personal history of malignant neoplasm of breast: Secondary | ICD-10-CM | POA: Diagnosis not present

## 2022-12-30 DIAGNOSIS — R599 Enlarged lymph nodes, unspecified: Secondary | ICD-10-CM | POA: Diagnosis not present

## 2022-12-30 MED ORDER — LIDOCAINE HCL (PF) 1 % IJ SOLN
10.0000 mL | Freq: Once | INTRAMUSCULAR | Status: AC
  Administered 2022-12-30: 10 mL via INTRADERMAL
  Filled 2022-12-30: qty 10

## 2022-12-30 NOTE — Procedures (Signed)
Interventional Radiology Procedure Note  Procedure: [place] RT NECK NODE CORE BX    Complications: None  Estimated Blood Loss:  0  Findings: 18G CORES ON SALINE TELFA    Sharen Counter, MD

## 2023-01-01 LAB — SURGICAL PATHOLOGY

## 2023-01-05 ENCOUNTER — Telehealth: Payer: 59 | Admitting: Hematology and Oncology

## 2023-01-06 ENCOUNTER — Other Ambulatory Visit: Payer: Self-pay | Admitting: Hematology and Oncology

## 2023-01-07 ENCOUNTER — Inpatient Hospital Stay: Payer: 59 | Attending: Physician Assistant | Admitting: Hematology and Oncology

## 2023-01-07 DIAGNOSIS — Z9221 Personal history of antineoplastic chemotherapy: Secondary | ICD-10-CM | POA: Insufficient documentation

## 2023-01-07 DIAGNOSIS — Z170 Estrogen receptor positive status [ER+]: Secondary | ICD-10-CM | POA: Diagnosis not present

## 2023-01-07 DIAGNOSIS — C770 Secondary and unspecified malignant neoplasm of lymph nodes of head, face and neck: Secondary | ICD-10-CM | POA: Insufficient documentation

## 2023-01-07 DIAGNOSIS — Z79811 Long term (current) use of aromatase inhibitors: Secondary | ICD-10-CM | POA: Insufficient documentation

## 2023-01-07 DIAGNOSIS — Z9012 Acquired absence of left breast and nipple: Secondary | ICD-10-CM | POA: Insufficient documentation

## 2023-01-07 DIAGNOSIS — Z1721 Progesterone receptor positive status: Secondary | ICD-10-CM | POA: Insufficient documentation

## 2023-01-07 DIAGNOSIS — C50412 Malignant neoplasm of upper-outer quadrant of left female breast: Secondary | ICD-10-CM | POA: Diagnosis not present

## 2023-01-07 DIAGNOSIS — Z923 Personal history of irradiation: Secondary | ICD-10-CM | POA: Insufficient documentation

## 2023-01-07 DIAGNOSIS — C7951 Secondary malignant neoplasm of bone: Secondary | ICD-10-CM | POA: Insufficient documentation

## 2023-01-07 MED ORDER — HYDROCODONE-ACETAMINOPHEN 5-325 MG PO TABS: 1.0000 | ORAL_TABLET | Freq: Four times a day (QID) | ORAL | 0 refills | Status: DC | PRN

## 2023-01-07 NOTE — Progress Notes (Signed)
Telephone visit   Patient Care Team: Joaquim Nam, MD as PCP - General (Family Medicine) Jim Like, RN as Registered Nurse Jim Like, RN as Registered Nurse Pershing Proud, RN as Oncology Nurse Navigator Donnelly Angelica, RN as Oncology Nurse Navigator Dorothy Puffer, MD as Consulting Physician (Radiation Oncology) Serena Croissant, MD as Consulting Physician (Hematology and Oncology) Griselda Miner, MD as Consulting Physician (General Surgery)  DIAGNOSIS:  Encounter Diagnosis  Name Primary?   Malignant neoplasm of upper-outer quadrant of left breast in female, estrogen receptor positive (HCC) Yes    SUMMARY OF ONCOLOGIC HISTORY: Oncology History  Malignant neoplasm of upper-outer quadrant of left breast in female, estrogen receptor positive (HCC)  08/25/2019 Initial Diagnosis   Palpable left breast mass x1 month. Diagnostic mammogram and [place] on 08/18/19 showed a 2.8cm mass at the 2 o'clock position, and one lymph node with mild cortical thickening in the left axilla. Biopsy on 08/25/19 showed invasive mammary carcinoma in the breast, grade 3, HER-2 equivocal by IHC (2+) negative by FISH, ER+ >90%, PR+ >90%, left axilla negative   10/07/2019 Surgery   Left lumpectomy and sentinel lymph node biopsy Carolynne Edouard) [id number]): Grade 3 IDC with DCIS, 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   10/18/2019 Cancer Staging   Staging form: Breast, AJCC 8th Edition - Pathologic stage from 10/18/2019: Stage IB (pT2, pN0(sn), cM0, G3, ER+, PR+, HER2-)   10/21/2019 Oncotype testing   The Oncotype DX score was 32 predicting a risk of outside the breast recurrence over the next 9 years of 20% if the patient's only systemic therapy is tamoxifen for 5 years.    11/15/2019 - 03/27/2020 Chemotherapy   Adjuvant chemotherapy with dose dense Adriamycin and Cytoxan x4 (11/15/2019 - 12/27/2019) followed by Taxol weekly x12 (01/09/2020 - 03/27/20).   04/25/2020 - 05/25/2020 Radiation  Therapy   The patient initially received a dose of 42.56 Gy in 16 fractions to the breast using whole-breast tangent fields. This was delivered using a 3-D conformal technique. The pt received a boost delivering an additional 8 Gy in 4 fractions using a electron boost with electrons. The total dose was 50.56 Gy.   05/2020 -  Anti-estrogen oral therapy   Anastrozole   05/06/2021 Surgery   Left mastectomy: Benign (performed because of left breast fibrosis and pain)     CHIEF COMPLIANT: Follow-up after right cervical lymph node biopsy Next Julia [name] is a 57 year old with above-mentioned history of left breast cancer who presented to the clinic with left neck swelling and CT scans revealed an enlarged left cervical lymph node.  This was biopsied came grade 3 IDC there is ER/PR positive HER2 negative.  ALLERGIES:  is allergic to propoxyphene n-acetaminophen, nabumetone, and rofecoxib.  MEDICATIONS:  Current Outpatient Medications  Medication Sig Dispense Refill   ACCU-CHEK GUIDE test strip USE TO TEST BLOOD SUGAR UP TO 4 TIMES A DAY AS DIRECTED 100 strip 3   Accu-Chek Softclix Lancets lancets Check sugar once daily. 100 each 5   blood glucose meter kit and supplies KIT Dispense based on patient and insurance preference. Use up to four times daily as directed. (FOR ICD-9 250.00, 250.01). 1 each 0   famotidine (PEPCID) 20 MG tablet TAKE 1 TABLET BY MOUTH TWICE A DAY 180 tablet 1   gabapentin (NEURONTIN) 300 MG capsule TAKE 2 CAPSULES BY MOUTH 3 TIMES DAILY. TAKE UP TO 4 CAPSULES A DAY (2 CAPSULES TWICE A DAY) 180 capsule 3   hyaluronate  sodium (RADIAPLEXRX) GEL APPLY 1 APPLICATION TOPICALLY DAILY 170 g 3   HYDROcodone-acetaminophen (NORCO) 5-325 MG tablet Take 1-2 tablets by mouth every 6 (six) hours as needed for severe pain. Sedation caution 60 tablet 0   ibuprofen (ADVIL) 600 MG tablet Take 1 tablet (600 mg total) by mouth every 8 (eight) hours as needed. 90 tablet 0   metFORMIN (GLUCOPHAGE)  500 MG tablet Take 1 tablet (500 mg total) by mouth daily at 2 PM. TAKE 1 TABLET BY MOUTH ONCE DAILY WITH MEAL 90 tablet 0   omeprazole (PRILOSEC) 20 MG capsule TAKE 1 CAPSULE BY MOUTH EVERY OTHER DAY 90 capsule 1   SUMAtriptan (IMITREX) 50 MG tablet Take 1 tablet (50 mg total) by mouth every 2 (two) hours as needed for migraine (max 2 doses in 24 hours.). May repeat in 2 hours if headache persists or recurs. 10 tablet 2   tiZANidine (ZANAFLEX) 2 MG tablet Take 1 tablet (2 mg total) by mouth every 8 (eight) hours as needed for muscle spasms. 30 tablet 1   VENTOLIN HFA 108 (90 Base) MCG/ACT inhaler TAKE 2 PUFFS BY MOUTH EVERY 6 HOURS AS NEEDED FOR WHEEZE OR SHORTNESS OF BREATH 18 each 1   No current facility-administered medications for this visit.    PHYSICAL EXAMINATION: ECOG PERFORMANCE STATUS: 1 - Symptomatic but completely ambulatory     LABORATORY DATA:  I have reviewed the data as listed    Latest Ref Rng & Units 11/20/2022    1:54 PM 11/10/2022    2:38 PM 05/19/2022    7:46 AM  CMP  Glucose 70 - 99 mg/dL 981  89  191   BUN 6 - 20 mg/dL 13  8  10    Creatinine 0.44 - 1.00 mg/dL 4.78  2.95  6.21   Sodium 135 - 145 mmol/L 136  140  138   Potassium 3.5 - 5.1 mmol/L 3.8  4.1  3.9   Chloride 98 - 111 mmol/L 104  110  105   CO2 22 - 32 mmol/L 26  25  26    Calcium 8.9 - 10.3 mg/dL 9.1  9.5  30.8   Total Protein 6.5 - 8.1 g/dL 6.5  6.8  7.1   Total Bilirubin 0.3 - 1.2 mg/dL 0.3  0.4  0.6   Alkaline Phos 38 - 126 U/L 69  71  85   AST 15 - 41 U/L 16  14  13    ALT 0 - 44 U/L 8  7  11      Lab Results  Component Value Date   WBC 8.3 11/20/2022   HGB 13.7 11/20/2022   HCT 39.3 11/20/2022   MCV 90.8 11/20/2022   PLT 251 11/20/2022   NEUTROABS 4.5 11/20/2022    ASSESSMENT & PLAN:  Malignant neoplasm of upper-outer quadrant of left breast in female, estrogen receptor positive (HCC) 10/07/2019:Left lumpectomy and sentinel lymph node biopsy Carolynne Edouard): IDC with DCIS, 3.3cm, grade 3, 6 left  axillary lymph nodes negative.  ER 90%, PR 90%, HER-2 negative by FISH Posterior margin positive but no further surgery done because it is muscle on the back. T2N0 stage Ib Oncotype DX score 32: 20% risk of distant recurrence in 9 years   Treatment plan:  1. Adjuvant chemotherapy with dose dense Adriamycin and Cytoxan x4 followed by Taxol weekly x12 completed 03/27/21 2. followed by radiation therapy started 04/26/20 3.  Followed by adjuvant antiestrogen therapy.  Started February 2022 ------------------------------------------------------------------------------------------------------------  Right neck swelling CT neck 11/12/2022:  9 mm level 5 lymph node, biopsy: Metastatic carcinoma consistent with breast primary, ER 95%, PR 80%, HER2 0-1+  Recommendation: PET CT scan for staging Excision of the lymph node if there is only site of disease. If there is distant metastatic disease then we will plan to treat her with Verzinio along with letrozole.  Telephone visit after PET CT scan to discuss results.  Orders Placed This Encounter  Procedures   NM PET Image Initial (PI) Skull Base To Thigh    Standing Status:   Future    Standing Expiration Date:   01/07/2024    Order Specific Question:   If indicated for the ordered procedure, I authorize the administration of a radiopharmaceutical per Radiology protocol    Answer:   Yes    Order Specific Question:   Is the patient pregnant?    Answer:   No    Order Specific Question:   Preferred imaging location?    Answer:   [facility]    Order Specific Question:   Release to patient    Answer:   Immediate   The patient has a good understanding of the overall plan. she agrees with it. she will call with any problems that may develop before the next visit here. Total time spent: 30 mins including face to face time and time spent for planning, charting and co-ordination of care   Tamsen Meek, MD 01/07/23

## 2023-01-07 NOTE — Telephone Encounter (Signed)
You have a telephone appt with pt this afternoon .

## 2023-01-07 NOTE — Assessment & Plan Note (Signed)
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  ER 90%, PR 90%, HER-2 negative by FISH Posterior margin positive but no further surgery done because it is muscle on the back. T2N0 stage Ib Oncotype DX score 32: 20% risk of distant recurrence in 9 years   Treatment plan:  1. Adjuvant chemotherapy with dose dense Adriamycin and Cytoxan x4 followed by Taxol weekly x12 completed 03/27/21 2. followed by radiation therapy started 04/26/20 3.  Followed by adjuvant antiestrogen therapy.  Started February 2022 ------------------------------------------------------------------------------------------------------------  Right neck swelling CT neck 11/12/2022: 9 mm level 5 lymph node, biopsy: Metastatic carcinoma consistent with breast primary, ER 95%, PR 80%, HER2 0-1+  Recommendation: PET CT scan for staging Excision of the lymph node if there is only site of disease. If there is distant metastatic disease then we will plan to treat her with Verzinio along with letrozole.  Telephone visit after PET CT scan to discuss results.

## 2023-01-09 ENCOUNTER — Other Ambulatory Visit: Payer: Self-pay | Admitting: Family Medicine

## 2023-01-09 DIAGNOSIS — Z1211 Encounter for screening for malignant neoplasm of colon: Secondary | ICD-10-CM

## 2023-01-09 DIAGNOSIS — Z1212 Encounter for screening for malignant neoplasm of rectum: Secondary | ICD-10-CM

## 2023-01-20 ENCOUNTER — Ambulatory Visit (HOSPITAL_COMMUNITY)
Admission: RE | Admit: 2023-01-20 | Discharge: 2023-01-20 | Disposition: A | Discharge status: Home / Self Care | Payer: 59 | Source: Ambulatory Visit | Attending: Hematology and Oncology | Admitting: Hematology and Oncology

## 2023-01-20 DIAGNOSIS — Z170 Estrogen receptor positive status [ER+]: Secondary | ICD-10-CM | POA: Diagnosis present

## 2023-01-20 DIAGNOSIS — C50412 Malignant neoplasm of upper-outer quadrant of left female breast: Secondary | ICD-10-CM | POA: Diagnosis present

## 2023-01-20 DIAGNOSIS — R918 Other nonspecific abnormal finding of lung field: Secondary | ICD-10-CM | POA: Diagnosis not present

## 2023-01-20 DIAGNOSIS — C7951 Secondary malignant neoplasm of bone: Secondary | ICD-10-CM | POA: Diagnosis not present

## 2023-01-20 LAB — GLUCOSE, CAPILLARY: Glucose-Capillary: 144 mg/dL — ABNORMAL HIGH (ref 70–99)

## 2023-01-20 MED ORDER — FLUDEOXYGLUCOSE F - 18 (FDG) INJECTION
6.5000 | Freq: Once | INTRAVENOUS | Status: AC | PRN
  Administered 2023-01-20: 6.55 via INTRAVENOUS

## 2023-01-21 ENCOUNTER — Other Ambulatory Visit: Payer: Self-pay | Admitting: Family Medicine

## 2023-01-21 ENCOUNTER — Other Ambulatory Visit: Payer: Self-pay | Admitting: Hematology and Oncology

## 2023-01-23 MED ORDER — IBUPROFEN 600 MG PO TABS: 600.0000 mg | ORAL_TABLET | Freq: Three times a day (TID) | ORAL | 0 refills | Status: DC | PRN

## 2023-01-26 ENCOUNTER — Encounter: Payer: Self-pay | Admitting: Adult Health

## 2023-01-26 ENCOUNTER — Other Ambulatory Visit: Payer: Self-pay | Admitting: Family Medicine

## 2023-01-26 ENCOUNTER — Other Ambulatory Visit: Payer: Self-pay | Admitting: Hematology and Oncology

## 2023-01-26 MED ORDER — HYDROCODONE-ACETAMINOPHEN 5-325 MG PO TABS: 1.0000 | ORAL_TABLET | Freq: Four times a day (QID) | ORAL | 0 refills | Status: DC | PRN

## 2023-02-03 ENCOUNTER — Other Ambulatory Visit: Payer: Self-pay | Admitting: Hematology and Oncology

## 2023-02-04 ENCOUNTER — Telehealth: Payer: Self-pay

## 2023-02-04 ENCOUNTER — Telehealth: Payer: Self-pay | Admitting: Pharmacy Technician

## 2023-02-04 ENCOUNTER — Inpatient Hospital Stay (HOSPITAL_BASED_OUTPATIENT_CLINIC_OR_DEPARTMENT_OTHER): Payer: 59 | Admitting: Hematology and Oncology

## 2023-02-04 ENCOUNTER — Inpatient Hospital Stay: Payer: 59

## 2023-02-04 ENCOUNTER — Encounter: Payer: Self-pay | Admitting: Hematology and Oncology

## 2023-02-04 ENCOUNTER — Other Ambulatory Visit (HOSPITAL_COMMUNITY): Payer: Self-pay

## 2023-02-04 VITALS — BP 144/61 | HR 77 | Temp 97.9°F | Resp 18 | Ht 64.0 in | Wt 126.7 lb

## 2023-02-04 DIAGNOSIS — Z79811 Long term (current) use of aromatase inhibitors: Secondary | ICD-10-CM | POA: Diagnosis not present

## 2023-02-04 DIAGNOSIS — Z1721 Progesterone receptor positive status: Secondary | ICD-10-CM | POA: Diagnosis not present

## 2023-02-04 DIAGNOSIS — Z9221 Personal history of antineoplastic chemotherapy: Secondary | ICD-10-CM | POA: Diagnosis not present

## 2023-02-04 DIAGNOSIS — C7951 Secondary malignant neoplasm of bone: Secondary | ICD-10-CM

## 2023-02-04 DIAGNOSIS — Z923 Personal history of irradiation: Secondary | ICD-10-CM | POA: Diagnosis not present

## 2023-02-04 DIAGNOSIS — C50919 Malignant neoplasm of unspecified site of unspecified female breast: Secondary | ICD-10-CM

## 2023-02-04 DIAGNOSIS — C50412 Malignant neoplasm of upper-outer quadrant of left female breast: Secondary | ICD-10-CM

## 2023-02-04 DIAGNOSIS — Z170 Estrogen receptor positive status [ER+]: Secondary | ICD-10-CM | POA: Diagnosis not present

## 2023-02-04 DIAGNOSIS — Z9012 Acquired absence of left breast and nipple: Secondary | ICD-10-CM | POA: Diagnosis not present

## 2023-02-04 DIAGNOSIS — C770 Secondary and unspecified malignant neoplasm of lymph nodes of head, face and neck: Secondary | ICD-10-CM | POA: Diagnosis not present

## 2023-02-04 LAB — CMP (CANCER CENTER ONLY)
ALT: 8 U/L (ref 0–44)
AST: 23 U/L (ref 15–41)
Albumin: 4.3 g/dL (ref 3.5–5.0)
Alkaline Phosphatase: 63 U/L (ref 38–126)
Anion gap: 7 (ref 5–15)
BUN: 12 mg/dL (ref 6–20)
CO2: 25 mmol/L (ref 22–32)
Calcium: 9.4 mg/dL (ref 8.9–10.3)
Chloride: 107 mmol/L (ref 98–111)
Creatinine: 0.72 mg/dL (ref 0.44–1.00)
GFR, Estimated: >60 mL/min (ref >60)
Glucose, Bld: 101 mg/dL — ABNORMAL HIGH (ref 70–99)
Potassium: 4 mmol/L (ref 3.5–5.1)
Sodium: 139 mmol/L (ref 135–145)
Total Bilirubin: 0.5 mg/dL (ref 0.3–1.2)
Total Protein: 7.2 g/dL (ref 6.5–8.1)

## 2023-02-04 LAB — CBC WITH DIFFERENTIAL (CANCER CENTER ONLY)
Abs Immature Granulocytes: 0.04 10*3/uL (ref 0.00–0.07)
Basophils Absolute: 0 10*3/uL (ref 0.0–0.1)
Basophils Relative: 1 %
Eosinophils Absolute: 0.2 10*3/uL (ref 0.0–0.5)
Eosinophils Relative: 3 %
HCT: 42.1 % (ref 36.0–46.0)
Hemoglobin: 14.7 g/dL (ref 12.0–15.0)
Immature Granulocytes: 1 %
Lymphocytes Relative: 28 %
Lymphs Abs: 2 10*3/uL (ref 0.7–4.0)
MCH: 32.2 pg (ref 26.0–34.0)
MCHC: 34.9 g/dL (ref 30.0–36.0)
MCV: 92.1 fL (ref 80.0–100.0)
Monocytes Absolute: 0.5 10*3/uL (ref 0.1–1.0)
Monocytes Relative: 6 %
Neutro Abs: 4.4 10*3/uL (ref 1.7–7.7)
Neutrophils Relative %: 61 %
Platelet Count: 220 10*3/uL (ref 150–400)
RBC: 4.57 MIL/uL (ref 3.87–5.11)
RDW: 13 % (ref 11.5–15.5)
WBC Count: 7.2 10*3/uL (ref 4.0–10.5)
nRBC: 0 % (ref 0.0–0.2)

## 2023-02-04 MED ORDER — ABEMACICLIB 100 MG PO TABS: 100.0000 mg | ORAL_TABLET | Freq: Two times a day (BID) | ORAL | 6 refills | Status: DC

## 2023-02-04 NOTE — Telephone Encounter (Signed)
Oral Oncology Patient Advocate Encounter  Prior Authorization for Kathlen Mody has been approved.    PA# [id number] Effective dates: 02/04/23 through 02/04/24  Patient must fill at [facility].  Patient has [facility] [place] as secondary coverage.   Jinger Neighbors, CPhT-Adv Oncology Pharmacy Patient Advocate [facility] Direct Number: [phone]  Fax: [phone]

## 2023-02-04 NOTE — Progress Notes (Signed)
Patient Care Team: Joaquim Nam, MD as PCP - General (Family Medicine) Jim Like, RN as Registered Nurse Jim Like, RN as Registered Nurse Pershing Proud, RN as Oncology Nurse Navigator Donnelly Angelica, RN as Oncology Nurse Navigator Dorothy Puffer, MD as Consulting Physician (Radiation Oncology) Serena Croissant, MD as Consulting Physician (Hematology and Oncology) Griselda Miner, MD as Consulting Physician (General Surgery)  DIAGNOSIS:  Encounter Diagnoses  Name Primary?   Malignant neoplasm of upper-outer quadrant of left breast in female, estrogen receptor positive (HCC) Yes   Carcinoma of breast metastatic to bone, unspecified laterality (HCC)     SUMMARY OF ONCOLOGIC HISTORY: Oncology History  Malignant neoplasm of upper-outer quadrant of left breast in female, estrogen receptor positive (HCC)  08/25/2019 Initial Diagnosis   Palpable left breast mass x1 month. Diagnostic mammogram and [place] on 08/18/19 showed a 2.8cm mass at the 2 o'clock position, and one lymph node with mild cortical thickening in the left axilla. Biopsy on 08/25/19 showed invasive mammary carcinoma in the breast, grade 3, HER-2 equivocal by IHC (2+) negative by FISH, ER+ >90%, PR+ >90%, left axilla negative   10/07/2019 Surgery   Left lumpectomy and sentinel lymph node biopsy Carolynne Edouard) [phone]): Grade 3 IDC with DCIS, 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   10/18/2019 Cancer Staging   Staging form: Breast, AJCC 8th Edition - Pathologic stage from 10/18/2019: Stage IB (pT2, pN0(sn), cM0, G3, ER+, PR+, HER2-)   10/21/2019 Oncotype testing   The Oncotype DX score was 32 predicting a risk of outside the breast recurrence over the next 9 years of 20% if the patient's only systemic therapy is tamoxifen for 5 years.    11/15/2019 - 03/27/2020 Chemotherapy   Adjuvant chemotherapy with dose dense Adriamycin and Cytoxan x4 (11/15/2019 - 12/27/2019) followed by Taxol weekly x12 (01/09/2020  - 03/27/20).   04/25/2020 - 05/25/2020 Radiation Therapy   The patient initially received a dose of 42.56 Gy in 16 fractions to the breast using whole-breast tangent fields. This was delivered using a 3-D conformal technique. The pt received a boost delivering an additional 8 Gy in 4 fractions using a electron boost with electrons. The total dose was 50.56 Gy.   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 The patient reports no pain in the bones but acknowledges significant weight loss, dropping from 165 pounds to 126 pounds within a year. The patient has been adherent to the prescribed hormone therapy pill, anastrozole, and denies any new symptoms or side effects from the medication. The patient is a smoker, a factor that may contribute to the presence of lung nodules observed in the scan. The patient's partner confirms the patient's adherence to the medication and expresses concern about the patient's weight loss and the potential spread of the cancer.         ALLERGIES:  is allergic to propoxyphene n-acetaminophen, nabumetone, and rofecoxib.  MEDICATIONS:  Current Outpatient Medications  Medication Sig Dispense Refill   abemaciclib (VERZENIO) 100 MG tablet Take 1 tablet (100 mg total) by mouth 2 (two) times daily. 60 tablet 6   ACCU-CHEK GUIDE test strip USE TO TEST BLOOD SUGAR UP  TO 4 TIMES A DAY AS DIRECTED 100 strip 3   Accu-Chek Softclix Lancets lancets Check sugar once daily. 100 each 5   blood glucose meter kit and supplies KIT Dispense based on patient and insurance preference. Use up to four times daily as directed. (FOR ICD-9 250.00, 250.01). 1 each 0   famotidine (PEPCID) 20 MG  tablet TAKE 1 TABLET BY MOUTH TWICE A DAY 180 tablet 1   gabapentin (NEURONTIN) 300 MG capsule TAKE 2 CAPSULES BY MOUTH 3 TIMES DAILY. TAKE UP TO 4 CAPSULES A DAY (2 CAPSULES TWICE A DAY) 180 capsule 3   hyaluronate sodium (RADIAPLEXRX) GEL APPLY 1 APPLICATION TOPICALLY DAILY 170 g 3   HYDROcodone-acetaminophen (NORCO) 5-325 MG tablet Take 1-2 tablets by mouth every 6 (six) hours as needed for severe pain (pain score 7-10). Sedation caution 60 tablet 0   ibuprofen (ADVIL) 600 MG tablet Take 1 tablet (600 mg total) by mouth every 8 (eight) hours as needed. 90 tablet 0   metFORMIN (GLUCOPHAGE) 500 MG tablet Take 1 tablet (500 mg total) by mouth daily at 2 PM. TAKE 1 TABLET BY MOUTH ONCE DAILY WITH MEAL 90 tablet 0   omeprazole (PRILOSEC) 20 MG capsule TAKE 1 CAPSULE BY MOUTH EVERY OTHER DAY 90 capsule 1   SUMAtriptan (IMITREX) 50 MG tablet Take 1 tablet (50 mg total) by mouth every 2 (two) hours as needed for migraine (max 2 doses in 24 hours.). May repeat in 2 hours if headache persists or recurs. 10 tablet 2   tiZANidine (ZANAFLEX) 2 MG tablet Take 1 tablet (2 mg total) by mouth every 8 (eight) hours as needed for muscle spasms. 30 tablet 1   VENTOLIN HFA 108 (90 Base) MCG/ACT inhaler TAKE 2 PUFFS BY MOUTH EVERY 6 HOURS AS NEEDED FOR WHEEZE OR SHORTNESS OF BREATH 18 each 1   No current facility-administered medications for this visit.    PHYSICAL EXAMINATION: ECOG PERFORMANCE STATUS: 1 - Symptomatic but completely ambulatory  Vitals:   02/04/23 1001  BP: (!) 144/61  Pulse: 77  Resp: 18  Temp: 97.9 F (36.6 C)  SpO2: 98%   Filed Weights   02/04/23 1001  Weight: 126 lb 11.2 oz (57.5 kg)    Physical Exam   MEASUREMENTS: WT- 126      (exam performed in the presence of a chaperone)  LABORATORY DATA:  I have reviewed the data as listed    Latest Ref Rng & Units 02/04/2023   11:00 AM 11/20/2022    1:54 PM 11/10/2022    2:38 PM  CMP  Glucose 70 - 99 mg/dL 161  096  89   BUN 6 - 20  mg/dL 12  13  8    Creatinine 0.44 - 1.00 mg/dL 0.45  4.09  8.11   Sodium 135 - 145 mmol/L 139  136  140   Potassium 3.5 - 5.1 mmol/L 4.0  3.8  4.1   Chloride 98 - 111 mmol/L 107  104  110   CO2 22 - 32 mmol/L 25  26  25    Calcium 8.9 - 10.3 mg/dL 9.4  9.1  9.5   Total Protein 6.5 - 8.1 g/dL 7.2  6.5  6.8   Total Bilirubin 0.3 - 1.2 mg/dL 0.5  0.3  0.4   Alkaline Phos 38 - 126 U/L 63  69  71   AST 15 - 41 U/L 23  16  14    ALT 0 - 44 U/L 8  8  7  Lab Results  Component Value Date   WBC 7.2 02/04/2023   HGB 14.7 02/04/2023   HCT 42.1 02/04/2023   MCV 92.1 02/04/2023   PLT 220 02/04/2023   NEUTROABS 4.4 02/04/2023    ASSESSMENT & PLAN:  Malignant neoplasm of upper-outer quadrant of left breast in female, estrogen receptor positive (HCC) 10/07/2019:Left lumpectomy and sentinel lymph node biopsy Carolynne Edouard): IDC with DCIS, 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  ER 90%, PR 90%, HER-2 negative by FISH Posterior margin positive but no further surgery done because it is muscle on the back. T2N0 stage Ib Oncotype DX score 32: 20% risk of distant recurrence in 9 years   Treatment plan:  1. Adjuvant chemotherapy with dose dense Adriamycin and Cytoxan x4 followed by Taxol weekly x12 completed 03/27/21 2. followed by radiation therapy started 04/26/20 3.  Followed by adjuvant antiestrogen therapy.  Started February 2022 ------------------------------------------------------------------------------------------------------------   Right neck swelling CT neck 11/12/2022: 9 mm level 5 lymph node, biopsy: Metastatic carcinoma consistent with breast primary, ER 95%, PR 80%, HER2 0-1+   Recommendation: PET CT scan 01/30/2023: 10 mm cervical LN, scattered pulm nodules below level of PET, Multiple bone mets Since there is distant metastatic disease then we will plan to treat her with Verzinio along with letrozole.   Abemaciclib counseling: I discussed at length the risks and benefits of  Abemaciclib in combination with letrozole. Adverse effects of Abemaciclib include decreasing neutrophil count, pneumonia, blood clots in lungs as well as nausea and GI symptoms. Side effects of letrozole include hot flashes, muscle aches and pains, uterine bleeding/spotting/cancer, osteoporosis, risk of blood clots.  RTC to follow up with Jonny Ruiz in 2-3 weeks  ------------------------------------- Assessment and Plan    Metastatic Breast Cancer (Estrogen Receptor Positive, Progesterone Receptor Positive, HER2 Negative) PET scan shows involvement of bones and possible lung nodules. No current bone pain reported. Biopsy of neck lymph node confirmed breast cancer origin. -Discontinue Anastrozole. -Start Faslodex injections (loading dose on day 1, 15, 28, then monthly). -Start Abemaciclib (Verzenio) 100mg  twice daily, with dose adjustments as tolerated. -Check blood work every couple of weeks initially, then monthly, and eventually every couple of months. -First follow-up scan in 3 months (CT and bone scan).  Bone Health History of Prolia use for bone strength. -Discontinue Prolia. -Start similar injection every 3 months.  Follow-up In 3 weeks to assess tolerance to new treatment regimen.          Orders Placed This Encounter  Procedures   CBC with Differential (Cancer Center Only)    Standing Status:   Standing    Number of Occurrences:   50    Standing Expiration Date:   02/04/2024   CMP (Cancer Center only)    Standing Status:   Standing    Number of Occurrences:   50    Standing Expiration Date:   02/04/2024   CA 27.29    Standing Status:   Standing    Number of Occurrences:   50    Standing Expiration Date:   02/04/2024   The patient has a good understanding of the overall plan. she agrees with it. she will call with any problems that may develop before the next visit here. Total time spent: 30 mins including face to face time and time spent for planning, charting and  co-ordination of care   Tamsen Meek, MD 02/04/23

## 2023-02-04 NOTE — Telephone Encounter (Signed)
Oral Oncology Pharmacist Encounter  Received new prescription for Verzenio (abemaciclib) for the treatment of distant metastatic ER positive, HER2 negative breast cancer in conjunction with letrozole, planned duration until disease progression or unacceptable toxicity.  Labs from 02/04/23 (CBC, CMP) assessed, no interventions needed. Prescription dose and frequency assessed for appropriateness.  Current medication list in Epic reviewed, DDIs with Verzenio identified: - metformin: verzenio can increase the adverse effects of metformin. Will have patient monitor.   Evaluated chart and no patient barriers to medication adherence noted.   Patient agreement for treatment documented in MD note on 02/04/2023.  Prescription has been e-scribed to the [facility] for benefits analysis and approval.  Oral Oncology Clinic will continue to follow for insurance authorization, copayment issues, initial counseling and start date.  Julia [name], PharmD Hematology/Oncology Clinical Pharmacist [facility] Oral Chemotherapy [facility] [phone] 02/04/2023 10:50 AM

## 2023-02-04 NOTE — Telephone Encounter (Signed)
Oral Oncology Patient Advocate Encounter   Received notification that prior authorization for Verzenio is required.   PA submitted on 02/04/23 Key [id number] Status is pending     Jinger Neighbors, CPhT-Adv Oncology Pharmacy Patient Advocate [facility] Direct Number: [phone]  Fax: [phone]

## 2023-02-04 NOTE — Assessment & Plan Note (Signed)
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  ER 90%, PR 90%, HER-2 negative by FISH Posterior margin positive but no further surgery done because it is muscle on the back. T2N0 stage Ib Oncotype DX score 32: 20% risk of distant recurrence in 9 years   Treatment plan:  1. Adjuvant chemotherapy with dose dense Adriamycin and Cytoxan x4 followed by Taxol weekly x12 completed 03/27/21 2. followed by radiation therapy started 04/26/20 3.  Followed by adjuvant antiestrogen therapy.  Started February 2022 ------------------------------------------------------------------------------------------------------------   Right neck swelling CT neck 11/12/2022: 9 mm level 5 lymph node, biopsy: Metastatic carcinoma consistent with breast primary, ER 95%, PR 80%, HER2 0-1+   Recommendation: PET CT scan 01/30/2023: 10 mm cervical LN, scattered pulm nodules below level of PET, Multiple bone mets Since there is distant metastatic disease then we will plan to treat her with Verzinio along with letrozole.   Abemaciclib counseling: I discussed at length the risks and benefits of Abemaciclib in combination with letrozole. Adverse effects of Abemaciclib include decreasing neutrophil count, pneumonia, blood clots in lungs as well as nausea and GI symptoms. Side effects of letrozole include hot flashes, muscle aches and pains, uterine bleeding/spotting/cancer, osteoporosis, risk of blood clots.  RTC to follow up with Jonny Ruiz in 2-3 weeks

## 2023-02-05 ENCOUNTER — Other Ambulatory Visit: Payer: Self-pay | Admitting: Hematology and Oncology

## 2023-02-05 LAB — CANCER ANTIGEN 27.29: CA 27.29: 77.6 U/mL — ABNORMAL HIGH (ref 0.0–38.6)

## 2023-02-06 MED ORDER — HYDROCODONE-ACETAMINOPHEN 5-325 MG PO TABS: 1.0000 | ORAL_TABLET | Freq: Four times a day (QID) | ORAL | 0 refills | Status: DC | PRN

## 2023-02-06 NOTE — Telephone Encounter (Signed)
Oral Chemotherapy Pharmacist Encounter  I spoke with patient for overview of: Verzenio for the treatment of metastatic, hormone-receptor positive breast cancer, in combination with faslodex, planned duration until disease progression or unacceptable toxicity.   Counseled patient on administration, dosing, side effects, monitoring, drug-food interactions, safe handling, storage, and disposal.  Patient will take Verzenio 100mg  tablets, 1 tablet by mouth twice daily without regard to food.  Patient knows to avoid grapefruit and grapefruit juice.  Verzenio start date: 02/07/2023  Adverse effects include but are not limited to: diarrhea, fatigue, nausea, abdominal pain, decreased blood counts, and increased liver function tests, and joint pains. Severe, life-threatening, and/or fatal interstitial lung disease (ILD) and/or pneumonitis may occur with CDK 4/6 inhibitors.  Patient has anti-emetic on hand and knows to take it if nausea develops.   Patient will obtain anti diarrheal and alert the office of 4 or more loose stools above baseline.  Reviewed with patient importance of keeping a medication schedule and plan for any missed doses. No barriers to medication adherence identified.  Medication reconciliation performed and medication/allergy list updated.  Insurance authorization for [employer] has been obtained. [facility] insurance.   Patient informed the pharmacy will reach out 5-7 days prior to needing next fill of Verzenio to coordinate continued medication acquisition to prevent break in therapy.  All questions answered. Patient voiced understanding and appreciation.   Medication education handout placed in mail for patient. Patient knows to call the office with questions or concerns. Oral Chemotherapy Clinic phone number provided to patient.   Follow-up visit is still pending but should be scheduled to see John in 2-3 weeks. She does not have  scheduled visit for faslodex yet, patient asked about when scheduling may call. I will reach out to RN to see if they can call to schedule for next week.   Bethel Born, PharmD Hematology/Oncology Clinical Pharmacist [facility] Oral [facility]otherapy [facility] [phone] 02/06/2023   11:45 AM

## 2023-02-09 ENCOUNTER — Telehealth: Payer: Self-pay | Admitting: Hematology and Oncology

## 2023-02-09 NOTE — Telephone Encounter (Signed)
Patient is aware of scheduled appointment times/dates

## 2023-02-11 ENCOUNTER — Inpatient Hospital Stay: Payer: 59 | Attending: Hematology and Oncology

## 2023-02-11 VITALS — BP 108/62 | HR 64 | Temp 98.7°F | Resp 18

## 2023-02-11 DIAGNOSIS — Z79818 Long term (current) use of other agents affecting estrogen receptors and estrogen levels: Secondary | ICD-10-CM | POA: Diagnosis not present

## 2023-02-11 DIAGNOSIS — C50412 Malignant neoplasm of upper-outer quadrant of left female breast: Secondary | ICD-10-CM | POA: Diagnosis present

## 2023-02-11 DIAGNOSIS — Z79899 Other long term (current) drug therapy: Secondary | ICD-10-CM | POA: Insufficient documentation

## 2023-02-11 DIAGNOSIS — Z170 Estrogen receptor positive status [ER+]: Secondary | ICD-10-CM | POA: Insufficient documentation

## 2023-02-11 DIAGNOSIS — C50919 Malignant neoplasm of unspecified site of unspecified female breast: Secondary | ICD-10-CM

## 2023-02-11 MED ORDER — DENOSUMAB 120 MG/1.7ML ~~LOC~~ SOLN
120.0000 mg | Freq: Once | SUBCUTANEOUS | Status: AC
  Administered 2023-02-11: 120 mg via SUBCUTANEOUS
  Filled 2023-02-11: qty 1.7

## 2023-02-11 MED ORDER — FULVESTRANT 250 MG/5ML IM SOSY
500.0000 mg | PREFILLED_SYRINGE | Freq: Once | INTRAMUSCULAR | Status: AC
  Administered 2023-02-11: 500 mg via INTRAMUSCULAR
  Filled 2023-02-11: qty 10

## 2023-02-11 NOTE — Progress Notes (Signed)
Ok to proceed with xgeva per prior auth team (waiting to obtain prior auth)

## 2023-02-14 ENCOUNTER — Other Ambulatory Visit: Payer: Self-pay | Admitting: Hematology and Oncology

## 2023-02-16 ENCOUNTER — Other Ambulatory Visit: Payer: Self-pay | Admitting: Hematology and Oncology

## 2023-02-16 MED ORDER — HYDROCODONE-ACETAMINOPHEN 5-325 MG PO TABS: 1.0000 | ORAL_TABLET | Freq: Four times a day (QID) | ORAL | 0 refills | Status: DC | PRN

## 2023-02-19 ENCOUNTER — Other Ambulatory Visit: Payer: Self-pay | Admitting: Hematology and Oncology

## 2023-02-19 MED ORDER — HYDROCODONE-ACETAMINOPHEN 5-325 MG PO TABS: 1.0000 | ORAL_TABLET | Freq: Four times a day (QID) | ORAL | 0 refills | Status: DC | PRN

## 2023-02-24 ENCOUNTER — Ambulatory Visit: Payer: Medicaid Other | Admitting: Family Medicine

## 2023-02-24 ENCOUNTER — Other Ambulatory Visit: Payer: Self-pay | Admitting: Hematology and Oncology

## 2023-02-25 ENCOUNTER — Other Ambulatory Visit: Payer: Self-pay

## 2023-02-25 ENCOUNTER — Encounter: Payer: Self-pay | Admitting: Hematology and Oncology

## 2023-02-25 NOTE — Telephone Encounter (Signed)
Telephone call  

## 2023-02-26 MED ORDER — HYDROCODONE-ACETAMINOPHEN 5-325 MG PO TABS: 1.0000 | ORAL_TABLET | Freq: Four times a day (QID) | ORAL | 0 refills | Status: DC | PRN

## 2023-03-04 ENCOUNTER — Encounter: Payer: Self-pay | Admitting: Hematology and Oncology

## 2023-03-10 ENCOUNTER — Other Ambulatory Visit: Payer: Self-pay | Admitting: Hematology and Oncology

## 2023-03-11 ENCOUNTER — Inpatient Hospital Stay: Payer: Medicaid Other | Admitting: Pharmacist

## 2023-03-11 ENCOUNTER — Ambulatory Visit: Payer: 59

## 2023-03-11 ENCOUNTER — Inpatient Hospital Stay: Payer: Medicaid Other | Attending: Hematology and Oncology

## 2023-03-11 ENCOUNTER — Inpatient Hospital Stay: Payer: Medicaid Other

## 2023-03-11 VITALS — BP 133/59 | HR 65 | Temp 96.5°F | Resp 14 | Wt 133.6 lb

## 2023-03-11 DIAGNOSIS — Z9012 Acquired absence of left breast and nipple: Secondary | ICD-10-CM | POA: Diagnosis not present

## 2023-03-11 DIAGNOSIS — C770 Secondary and unspecified malignant neoplasm of lymph nodes of head, face and neck: Secondary | ICD-10-CM | POA: Diagnosis not present

## 2023-03-11 DIAGNOSIS — Z5111 Encounter for antineoplastic chemotherapy: Secondary | ICD-10-CM | POA: Insufficient documentation

## 2023-03-11 DIAGNOSIS — C50919 Malignant neoplasm of unspecified site of unspecified female breast: Secondary | ICD-10-CM

## 2023-03-11 DIAGNOSIS — R5383 Other fatigue: Secondary | ICD-10-CM | POA: Diagnosis not present

## 2023-03-11 DIAGNOSIS — Z1721 Progesterone receptor positive status: Secondary | ICD-10-CM | POA: Diagnosis not present

## 2023-03-11 DIAGNOSIS — Z923 Personal history of irradiation: Secondary | ICD-10-CM | POA: Insufficient documentation

## 2023-03-11 DIAGNOSIS — C7951 Secondary malignant neoplasm of bone: Secondary | ICD-10-CM | POA: Diagnosis not present

## 2023-03-11 DIAGNOSIS — M898X9 Other specified disorders of bone, unspecified site: Secondary | ICD-10-CM | POA: Insufficient documentation

## 2023-03-11 DIAGNOSIS — C50412 Malignant neoplasm of upper-outer quadrant of left female breast: Secondary | ICD-10-CM | POA: Insufficient documentation

## 2023-03-11 DIAGNOSIS — Z170 Estrogen receptor positive status [ER+]: Secondary | ICD-10-CM

## 2023-03-11 LAB — CMP (CANCER CENTER ONLY)
ALT: 6 U/L (ref 0–44)
AST: 11 U/L — ABNORMAL LOW (ref 15–41)
Albumin: 4.4 g/dL (ref 3.5–5.0)
Alkaline Phosphatase: 122 U/L (ref 38–126)
Anion gap: 5 (ref 5–15)
BUN: 11 mg/dL (ref 6–20)
CO2: 25 mmol/L (ref 22–32)
Calcium: 9.2 mg/dL (ref 8.9–10.3)
Chloride: 108 mmol/L (ref 98–111)
Creatinine: 0.69 mg/dL (ref 0.44–1.00)
GFR, Estimated: >60 mL/min (ref >60)
Glucose, Bld: 110 mg/dL — ABNORMAL HIGH (ref 70–99)
Potassium: 4.4 mmol/L (ref 3.5–5.1)
Sodium: 138 mmol/L (ref 135–145)
Total Bilirubin: 0.5 mg/dL (ref <1.2)
Total Protein: 7.1 g/dL (ref 6.5–8.1)

## 2023-03-11 LAB — CBC WITH DIFFERENTIAL (CANCER CENTER ONLY)
Abs Immature Granulocytes: 0.01 10*3/uL (ref 0.00–0.07)
Basophils Absolute: 0 10*3/uL (ref 0.0–0.1)
Basophils Relative: 1 %
Eosinophils Absolute: 0.1 10*3/uL (ref 0.0–0.5)
Eosinophils Relative: 3 %
HCT: 39.6 % (ref 36.0–46.0)
Hemoglobin: 13.6 g/dL (ref 12.0–15.0)
Immature Granulocytes: 0 %
Lymphocytes Relative: 34 %
Lymphs Abs: 1.6 10*3/uL (ref 0.7–4.0)
MCH: 32.4 pg (ref 26.0–34.0)
MCHC: 34.3 g/dL (ref 30.0–36.0)
MCV: 94.3 fL (ref 80.0–100.0)
Monocytes Absolute: 0.3 10*3/uL (ref 0.1–1.0)
Monocytes Relative: 7 %
Neutro Abs: 2.5 10*3/uL (ref 1.7–7.7)
Neutrophils Relative %: 55 %
Platelet Count: 220 10*3/uL (ref 150–400)
RBC: 4.2 MIL/uL (ref 3.87–5.11)
RDW: 14.3 % (ref 11.5–15.5)
WBC Count: 4.5 10*3/uL (ref 4.0–10.5)
nRBC: 0 % (ref 0.0–0.2)

## 2023-03-11 MED ORDER — FULVESTRANT 250 MG/5ML IM SOSY
500.0000 mg | PREFILLED_SYRINGE | Freq: Once | INTRAMUSCULAR | Status: AC
  Administered 2023-03-11: 500 mg via INTRAMUSCULAR

## 2023-03-11 NOTE — Telephone Encounter (Signed)
REFUSE- too early for pt to request- just filled last week

## 2023-03-11 NOTE — Progress Notes (Signed)
[facility]       Telephone: [phone]?Fax: [phone]   Oncology Clinical Pharmacist Practitioner Initial Assessment  Julia [name] is a 57 y.o. female with a diagnosis of breast cancer. They were contacted today via in-person visit.  Indication/Regimen Abemaciclib (Verzenio) is being used appropriately for treatment of metastatic breast cancer by Dr. Serena Croissant.      Wt Readings from Last 1 Encounters:  02/04/23 126 lb 11.2 oz (57.5 kg)    Estimated body surface area is 1.61 meters squared as calculated from the following:   Height as of 02/04/23: 5\' 4"  (1.626 m).   Weight as of 02/04/23: 126 lb 11.2 oz (57.5 kg).  The dosing regimen is 150 mg by mouth every 12 hours on days 1 to 28 of a 28-day cycle. This is being given  in combination with fulvestrant started 02/11/23 and denosumab 120 mg started on 02/11/23 . It is planned to continue until disease progression or unacceptable toxicity. Prescription dose and frequency assessed for appropriateness.  Patient has agreed to treatment which is documented in physician note on 02/04/23. Counseled patient on administration, dosing, side effects, monitoring, drug-food interactions, safe handling, storage, and disposal.  Julia [name] was seen today to establish care on abemaciclib after being referred by Dr. Pamelia Hoit. So far, she is tolerating abemaciclib extremely well besides some fatigue. No reports of N/V/D and labs are WNL. We discussed increasing the dose if she continues to do well and she is interested, potentially at her next visit.  She will receive fulvestrant today (Dose #2) and then again in two weeks (Dose #3). Then every 4 weeks thereafter. She is next due for denosumab 120 mg every 12 weeks on 05/05/22. She will have labs every 2 weeks for 2 months, then monthly for 2 months, then likely every 12 weeks when receiving denosumab 120 mg. She is not interested in smoking cessation at this time.   Dose  Modifications Per Dr. Pamelia Hoit, patient starting at a reduced dose of 100 mg by mouth every 12 hours  Access Assessment DEOSHA MAMMEN will be receiving abemaciclib through [facility] Insurance Concerns: none Start date if known: 02/07/23  Adherence Assessment Reviewed importance on keeping a med schedule and plan for any missed doses Barriers to adherence identified? No  Allergies Allergies  Allergen Reactions   Propoxyphene N-Acetaminophen Hives   Nabumetone Rash    She can tolerate ibuprofen w/o difficulty   Rofecoxib Rash    Nightmares (vioxx)    Vitals    03/11/2023    9:10 AM 02/11/2023    8:27 AM 02/04/2023   10:01 AM  Oncology Vitals  Height   163 cm  Weight 60.596 kg  57.471 kg  Weight (lbs) 133 lbs 9 oz  126 lbs 11 oz  BMI 22.93 kg/m2  21.75 kg/m2  Temp 96.5 F (35.8 C) 98.7 F (37.1 C) 97.9 F (36.6 C)  Pulse Rate 65 64 77  BP 133/59 108/62 144/61  Resp 14 18 18   SpO2 100 % 98 % 98 %  BSA (m2) 1.65 m2  1.61 m2     Laboratory Data    Latest Ref Rng & Units 03/11/2023    8:14 AM 02/04/2023   11:00 AM 11/20/2022    1:54 PM  CBC EXTENDED  WBC 4.0 - 10.5 K/uL 4.5  7.2  8.3   RBC 3.87 - 5.11 MIL/uL 4.20  4.57  4.33   Hemoglobin 12.0 - 15.0 g/dL  13.6  14.7  13.7   HCT 36.0 - 46.0 % 39.6  42.1  39.3   Platelets 150 - 400 K/uL 220  220  251   NEUT# 1.7 - 7.7 K/uL 2.5  4.4  4.5   Lymph# 0.7 - 4.0 K/uL 1.6  2.0  2.8        Latest Ref Rng & Units 03/11/2023    8:14 AM 02/04/2023   11:00 AM 11/20/2022    1:54 PM  CMP  Glucose 70 - 99 mg/dL 782  956  213   BUN 6 - 20 mg/dL 11  12  13    Creatinine 0.44 - 1.00 mg/dL 0.86  5.78  4.69   Sodium 135 - 145 mmol/L 138  139  136   Potassium 3.5 - 5.1 mmol/L 4.4  4.0  3.8   Chloride 98 - 111 mmol/L 108  107  104   CO2 22 - 32 mmol/L 25  25  26    Calcium 8.9 - 10.3 mg/dL 9.2  9.4  9.1   Total Protein 6.5 - 8.1 g/dL 7.1  7.2  6.5   Total Bilirubin <1.2 mg/dL 0.5  0.5  0.3   Alkaline Phos 38 -  126 U/L 122  63  69   AST 15 - 41 U/L 11  23  16    ALT 0 - 44 U/L 6  8  8     Lab Results  Component Value Date   CA2729 77.6 (H) 02/04/2023    Contraindications Contraindications were reviewed? Yes Contraindications to therapy were identified? No   Safety Precautions The following safety precautions for the use of abemaciclib were reviewed:  Changes in kidney function: importance of drinking plenty of fluids and monitoring urine output Diarrhea: we reviewed that diarrhea is common with abemaciclib and confirmed that she does have loperamide (Imodium) at home.  We reviewed how to take this medication PRN and gave her information on abemaciclib Decreased white blood cells (WBCs) and increased risk for infection: we discussed the importance of having a thermometer and what the Centers for Disease Control and Prevention (CDC) considers a fever which is 100.28F (38C) or higher.  Gave patient 24/7 triage line to call if any fevers or symptoms Decreased hemoglobin, part of red blood cells that carry iron and oxygen Fatigue Nausea and Vomiting Hepatotoxicity: reviewed to contact clinic for RUQ pain that will not subside, yellowing of eyes/skin Decreased appetite or weight loss Abdominal pain Decreased platelet count and increased risk for bleeding Venous thromboembolism (VTE): reviewed signs of deep vein thrombosis (DVT) such as leg swelling, redness, pain, or tenderness and signs of pulmonary embolism (PE) such as shortness of breath, rapid or irregular heartbeat, cough, chest pain, or lightheadedness ILD/Pneumonitis: we reviewed potential symptoms including cough, shortness, and fatigue. Handling body fluids and waste Pregnancy, sexual activity, and contraception Avoid grapefruit products Reviewed to take the medication every 12 hours (with food sometimes can be easier on the stomach) and to take it at the same time every day. Discussed proper storage and handling of  abemaciclib  Medication Reconciliation Current Outpatient Medications  Medication Sig Dispense Refill   abemaciclib (VERZENIO) 100 MG tablet Take 1 tablet (100 mg total) by mouth 2 (two) times daily. 60 tablet 6   famotidine (PEPCID) 20 MG tablet TAKE 1 TABLET BY MOUTH TWICE A DAY 180 tablet 1   gabapentin (NEURONTIN) 300 MG capsule TAKE 2 CAPSULES BY MOUTH 3 TIMES DAILY. TAKE UP TO 4 CAPSULES A DAY (2  CAPSULES TWICE A DAY) 180 capsule 3   HYDROcodone-acetaminophen (NORCO) 5-325 MG tablet Take 1-2 tablets by mouth every 6 (six) hours as needed for severe pain (pain score 7-10). Sedation caution 90 tablet 0   ibuprofen (ADVIL) 600 MG tablet Take 1 tablet (600 mg total) by mouth every 8 (eight) hours as needed. 90 tablet 0   omeprazole (PRILOSEC) 20 MG capsule TAKE 1 CAPSULE BY MOUTH EVERY OTHER DAY 90 capsule 1   SUMAtriptan (IMITREX) 50 MG tablet Take 1 tablet (50 mg total) by mouth every 2 (two) hours as needed for migraine (max 2 doses in 24 hours.). May repeat in 2 hours if headache persists or recurs. 10 tablet 2   tiZANidine (ZANAFLEX) 2 MG tablet Take 1 tablet (2 mg total) by mouth every 8 (eight) hours as needed for muscle spasms. 30 tablet 1   VENTOLIN HFA 108 (90 Base) MCG/ACT inhaler TAKE 2 PUFFS BY MOUTH EVERY 6 HOURS AS NEEDED FOR WHEEZE OR SHORTNESS OF BREATH 18 each 1   hyaluronate sodium (RADIAPLEXRX) GEL APPLY 1 APPLICATION TOPICALLY DAILY (Patient not taking: Reported on 03/11/2023) 170 g 3   No current facility-administered medications for this visit.   Facility-Administered Medications Ordered in Other Visits  Medication Dose Route Frequency Provider Last Rate Last Admin   fulvestrant (FASLODEX) injection 500 mg  500 mg Intramuscular Once Serena Croissant, MD        Medication reconciliation is based on the patient's most recent medication list in the electronic medical record (EMR) including herbal products and OTC medications.   The patient's medication list was reviewed  today with the patient? Yes   Drug-drug interactions (DDIs) DDIs were evaluated? Yes Significant DDIs identified? No   Drug-Food Interactions Drug-food interactions were evaluated? Yes Drug-food interactions identified?  Avoid grapefruit products  Follow-up Plan  Patient education handout given to patient Continue abemaciclib 100 mg by mouth every 12 hours Continue fulvestrant 500 mg IM. Dose #2 today, Dose #3 on 03/25/23 when seeing Dr. Pamelia Hoit, then every 28 days thereafter Continue denosumab 120 mg SubQ every 12 weeks. Next due 05/06/23 CA 27.29 will result tomorrow and go directly to Dr. Pamelia Hoit Restaging scans likely in early February per Dr. Pamelia Hoit Ms. Sturges will let Dr. Pamelia Hoit know if she is interested in pain management/palliative care referral. Add labs, Dr. Pamelia Hoit visit, and fulvestrant on 03/25/23 Add labs, pharmacy clinic visit on 04/08/22  EARSIE DOWSE participated in the discussion, expressed understanding, and voiced agreement with the above plan. All questions were answered to her satisfaction. The patient was advised to contact the clinic at [phone] with any questions or concerns prior to her return visit.   I spent 60 minutes assessing the patient.  Jorel Gravlin A. Odetta Pink, PharmD, BCOP, CPP  Anselm Lis, RPH-CPP, 03/11/2023 8:43 AM  **Disclaimer: This note was dictated with voice recognition software. Similar sounding words can inadvertently be transcribed and this note may contain transcription errors which may not have been corrected upon publication of note.**

## 2023-03-12 LAB — CANCER ANTIGEN 27.29: CA 27.29: 90 U/mL — ABNORMAL HIGH (ref 0.0–38.6)

## 2023-03-12 MED ORDER — HYDROCODONE-ACETAMINOPHEN 5-325 MG PO TABS: 1.0000 | ORAL_TABLET | Freq: Four times a day (QID) | ORAL | 0 refills | Status: DC | PRN

## 2023-03-12 NOTE — Telephone Encounter (Signed)
Received los from Dr.In

## 2023-03-25 ENCOUNTER — Other Ambulatory Visit (HOSPITAL_COMMUNITY): Payer: Self-pay

## 2023-03-25 ENCOUNTER — Telehealth: Payer: Self-pay | Admitting: *Deleted

## 2023-03-25 ENCOUNTER — Inpatient Hospital Stay (HOSPITAL_BASED_OUTPATIENT_CLINIC_OR_DEPARTMENT_OTHER): Payer: Medicaid Other | Admitting: Hematology and Oncology

## 2023-03-25 ENCOUNTER — Encounter: Payer: Self-pay | Admitting: Hematology and Oncology

## 2023-03-25 ENCOUNTER — Other Ambulatory Visit: Payer: Self-pay | Admitting: Hematology and Oncology

## 2023-03-25 ENCOUNTER — Inpatient Hospital Stay: Payer: Medicaid Other

## 2023-03-25 ENCOUNTER — Other Ambulatory Visit: Payer: Self-pay

## 2023-03-25 VITALS — BP 126/57 | HR 75 | Temp 97.6°F | Resp 18 | Ht 64.0 in | Wt 128.8 lb

## 2023-03-25 DIAGNOSIS — Z923 Personal history of irradiation: Secondary | ICD-10-CM | POA: Diagnosis not present

## 2023-03-25 DIAGNOSIS — Z170 Estrogen receptor positive status [ER+]: Secondary | ICD-10-CM

## 2023-03-25 DIAGNOSIS — C50919 Malignant neoplasm of unspecified site of unspecified female breast: Secondary | ICD-10-CM

## 2023-03-25 DIAGNOSIS — C770 Secondary and unspecified malignant neoplasm of lymph nodes of head, face and neck: Secondary | ICD-10-CM | POA: Diagnosis not present

## 2023-03-25 DIAGNOSIS — Z5111 Encounter for antineoplastic chemotherapy: Secondary | ICD-10-CM | POA: Diagnosis not present

## 2023-03-25 DIAGNOSIS — C50412 Malignant neoplasm of upper-outer quadrant of left female breast: Secondary | ICD-10-CM

## 2023-03-25 DIAGNOSIS — Z1721 Progesterone receptor positive status: Secondary | ICD-10-CM | POA: Diagnosis not present

## 2023-03-25 DIAGNOSIS — M898X9 Other specified disorders of bone, unspecified site: Secondary | ICD-10-CM | POA: Diagnosis not present

## 2023-03-25 DIAGNOSIS — Z9012 Acquired absence of left breast and nipple: Secondary | ICD-10-CM | POA: Diagnosis not present

## 2023-03-25 DIAGNOSIS — C7951 Secondary malignant neoplasm of bone: Secondary | ICD-10-CM | POA: Diagnosis not present

## 2023-03-25 DIAGNOSIS — R5383 Other fatigue: Secondary | ICD-10-CM | POA: Diagnosis not present

## 2023-03-25 LAB — CBC WITH DIFFERENTIAL (CANCER CENTER ONLY)
Abs Immature Granulocytes: 0.01 10*3/uL (ref 0.00–0.07)
Basophils Absolute: 0 10*3/uL (ref 0.0–0.1)
Basophils Relative: 1 %
Eosinophils Absolute: 0.1 10*3/uL (ref 0.0–0.5)
Eosinophils Relative: 3 %
HCT: 39.8 % (ref 36.0–46.0)
Hemoglobin: 13.5 g/dL (ref 12.0–15.0)
Immature Granulocytes: 0 %
Lymphocytes Relative: 36 %
Lymphs Abs: 1.8 10*3/uL (ref 0.7–4.0)
MCH: 33.1 pg (ref 26.0–34.0)
MCHC: 33.9 g/dL (ref 30.0–36.0)
MCV: 97.5 fL (ref 80.0–100.0)
Monocytes Absolute: 0.4 10*3/uL (ref 0.1–1.0)
Monocytes Relative: 7 %
Neutro Abs: 2.6 10*3/uL (ref 1.7–7.7)
Neutrophils Relative %: 53 %
Platelet Count: 233 10*3/uL (ref 150–400)
RBC: 4.08 MIL/uL (ref 3.87–5.11)
RDW: 15.2 % (ref 11.5–15.5)
WBC Count: 4.9 10*3/uL (ref 4.0–10.5)
nRBC: 0 % (ref 0.0–0.2)

## 2023-03-25 LAB — CMP (CANCER CENTER ONLY)
ALT: 6 U/L (ref 0–44)
AST: 10 U/L — ABNORMAL LOW (ref 15–41)
Albumin: 4.2 g/dL (ref 3.5–5.0)
Alkaline Phosphatase: 136 U/L — ABNORMAL HIGH (ref 38–126)
Anion gap: 5 (ref 5–15)
BUN: 13 mg/dL (ref 6–20)
CO2: 25 mmol/L (ref 22–32)
Calcium: 8.5 mg/dL — ABNORMAL LOW (ref 8.9–10.3)
Chloride: 108 mmol/L (ref 98–111)
Creatinine: 0.67 mg/dL (ref 0.44–1.00)
GFR, Estimated: >60 mL/min (ref >60)
Glucose, Bld: 101 mg/dL — ABNORMAL HIGH (ref 70–99)
Potassium: 4.7 mmol/L (ref 3.5–5.1)
Sodium: 138 mmol/L (ref 135–145)
Total Bilirubin: 0.3 mg/dL (ref <1.2)
Total Protein: 6.6 g/dL (ref 6.5–8.1)

## 2023-03-25 MED ORDER — ABEMACICLIB 150 MG PO TABS: 150.0000 mg | ORAL_TABLET | Freq: Two times a day (BID) | ORAL | 6 refills | Status: DC

## 2023-03-25 NOTE — Telephone Encounter (Signed)
Received VM from pt requesting refill on Norco be sent to a different pharmacy than what is on file.  MD recently sent in refill on 12/5 and RN verified with pharmacy that pt did pick up prescription. RN attempt x2 to contact pt for further information, no answer, LVM for pt to return call to the office.

## 2023-03-25 NOTE — Assessment & Plan Note (Addendum)
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  ER 90%, PR 90%, HER-2 negative by FISH Posterior margin positive but no further surgery done because it is muscle on the back. T2N0 stage Ib Oncotype DX score 32: 20% risk of distant recurrence in 9 years   Treatment plan:  1. Adjuvant chemotherapy with dose dense Adriamycin and Cytoxan x4 followed by Taxol weekly x12 completed 03/27/21 2. followed by radiation therapy started 04/26/20 3.  Followed by adjuvant antiestrogen therapy.  Started February 2022 ------------------------------------------------------------------------------------------------------------   Right neck swelling CT neck 11/12/2022: 9 mm level 5 lymph node, biopsy: Metastatic carcinoma consistent with breast primary, ER 95%, PR 80%, HER2 0-1+   Recommendation: PET CT scan 01/30/2023: 10 mm cervical LN, scattered pulm nodules below level of PET, Multiple bone mets Since there is distant metastatic disease then we will plan to treat her with Verzinio along with Faslodex started 02/04/2023   Abemaciclib toxicities:  Fatigue which is preventing her from staying as active as she used to be.  She is going to request an intermittent FMLA paperwork so that she can work less number of hours.  Bone metastasis: On Xgeva along with calcium and vitamin D Return to clinic monthly for follow-ups with labs.  Scans will be done at the end of January.

## 2023-03-25 NOTE — Progress Notes (Signed)
Patient Care Team: Joaquim Nam, MD as PCP - General (Family Medicine) Jim Like, RN as Registered Nurse Jim Like, RN as Registered Nurse Pershing Proud, RN as Oncology Nurse Navigator Donnelly Angelica, RN as Oncology Nurse Navigator Dorothy Puffer, MD as Consulting Physician (Radiation Oncology) Serena Croissant, MD as Consulting Physician (Hematology and Oncology) Griselda Miner, MD as Consulting Physician (General Surgery)  DIAGNOSIS:  Encounter Diagnosis  Name Primary?   Malignant neoplasm of upper-outer quadrant of left breast in female, estrogen receptor positive (HCC) Yes    SUMMARY OF ONCOLOGIC HISTORY: Oncology History  Malignant neoplasm of upper-outer quadrant of left breast in female, estrogen receptor positive (HCC)  08/25/2019 Initial Diagnosis   Palpable left breast mass x1 month. Diagnostic mammogram and [place] on 08/18/19 showed a 2.8cm mass at the 2 o'clock position, and one lymph node with mild cortical thickening in the left axilla. Biopsy on 08/25/19 showed invasive mammary carcinoma in the breast, grade 3, HER-2 equivocal by IHC (2+) negative by FISH, ER+ >90%, PR+ >90%, left axilla negative   10/07/2019 Surgery   Left lumpectomy and sentinel lymph node biopsy Carolynne Edouard) [phone]): Grade 3 IDC with DCIS, 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   10/18/2019 Cancer Staging   Staging form: Breast, AJCC 8th Edition - Pathologic stage from 10/18/2019: Stage IB (pT2, pN0(sn), cM0, G3, ER+, PR+, HER2-)   10/21/2019 Oncotype testing   The Oncotype DX score was 32 predicting a risk of outside the breast recurrence over the next 9 years of 20% if the patient's only systemic therapy is tamoxifen for 5 years.    11/15/2019 - 03/27/2020 Chemotherapy   Adjuvant chemotherapy with dose dense Adriamycin and Cytoxan x4 (11/15/2019 - 12/27/2019) followed by Taxol weekly x12 (01/09/2020 - 03/27/20).   04/25/2020 - 05/25/2020 Radiation Therapy   The patient  initially received a dose of 42.56 Gy in 16 fractions to the breast using whole-breast tangent fields. This was delivered using a 3-D conformal technique. The pt received a boost delivering an additional 8 Gy in 4 fractions using a electron boost with electrons. The total dose was 50.56 Gy.   05/2020 -  Anti-estrogen oral therapy   Anastrozole   05/06/2021 Surgery   Left mastectomy: Benign (performed because of left breast fibrosis and pain)     CHIEF COMPLIANT: Follow-up on Faslodex with Verzenio for metastatic breast cancer  HISTORY OF PRESENT ILLNESS:   History of Present Illness   The patient, on Verzenio for Met breast cancer, reports persistent fatigue since starting the medication two months ago. She denies experiencing diarrhea, a common side effect of Verzeni0. The patient is currently on a 100mg  dose of the medication. She also receives regular faslodex injections, which cause discomfort and soreness for a few days post-injection. The patient reports back pain, which starts in the middle of her back and radiates down to the left side. The patient also mentions a missed injection in November due to a scheduling error.   She reports that the nodule in the right side of the cervical area is smaller     ALLERGIES:  is allergic to propoxyphene n-acetaminophen, nabumetone, and rofecoxib.  MEDICATIONS:  Current Outpatient Medications  Medication Sig Dispense Refill   abemaciclib (VERZENIO) 150 MG tablet Take 1 tablet (150 mg total) by mouth 2 (two) times daily. 60 tablet 6   famotidine (PEPCID) 20 MG tablet TAKE 1 TABLET BY MOUTH TWICE A DAY 180 tablet 1  gabapentin (NEURONTIN) 300 MG capsule TAKE 2 CAPSULES BY MOUTH 3 TIMES DAILY. TAKE UP TO 4 CAPSULES A DAY (2 CAPSULES TWICE A DAY) 180 capsule 3   hyaluronate sodium (RADIAPLEXRX) GEL APPLY 1 APPLICATION TOPICALLY DAILY (Patient not taking: Reported on 03/11/2023) 170 g 3   HYDROcodone-acetaminophen (NORCO) 5-325 MG tablet Take  1-2 tablets by mouth every 6 (six) hours as needed for severe pain (pain score 7-10). Sedation caution 90 tablet 0   ibuprofen (ADVIL) 600 MG tablet Take 1 tablet (600 mg total) by mouth every 8 (eight) hours as needed. 90 tablet 0   omeprazole (PRILOSEC) 20 MG capsule TAKE 1 CAPSULE BY MOUTH EVERY OTHER DAY 90 capsule 1   SUMAtriptan (IMITREX) 50 MG tablet Take 1 tablet (50 mg total) by mouth every 2 (two) hours as needed for migraine (max 2 doses in 24 hours.). May repeat in 2 hours if headache persists or recurs. 10 tablet 2   tiZANidine (ZANAFLEX) 2 MG tablet Take 1 tablet (2 mg total) by mouth every 8 (eight) hours as needed for muscle spasms. 30 tablet 1   VENTOLIN HFA 108 (90 Base) MCG/ACT inhaler TAKE 2 PUFFS BY MOUTH EVERY 6 HOURS AS NEEDED FOR WHEEZE OR SHORTNESS OF BREATH 18 each 1   No current facility-administered medications for this visit.    PHYSICAL EXAMINATION: ECOG PERFORMANCE STATUS: 1 - Symptomatic but completely ambulatory  Vitals:   03/25/23 0828  BP: (!) 126/57  Pulse: 75  Resp: 18  Temp: 97.6 F (36.4 C)  SpO2: 100%   Filed Weights   03/25/23 0828  Weight: 128 lb 12.8 oz (58.4 kg)    Physical Exam         Cervical lymph node is smaller  LABORATORY DATA:  I have reviewed the data as listed    Latest Ref Rng & Units 03/25/2023    8:08 AM 03/11/2023    8:14 AM 02/04/2023   11:00 AM  CMP  Glucose 70 - 99 mg/dL 161  096  045   BUN 6 - 20 mg/dL 13  11  12    Creatinine 0.44 - 1.00 mg/dL 4.09  8.11  9.14   Sodium 135 - 145 mmol/L 138  138  139   Potassium 3.5 - 5.1 mmol/L 4.7  4.4  4.0   Chloride 98 - 111 mmol/L 108  108  107   CO2 22 - 32 mmol/L 25  25  25    Calcium 8.9 - 10.3 mg/dL 8.5  9.2  9.4   Total Protein 6.5 - 8.1 g/dL 6.6  7.1  7.2   Total Bilirubin <1.2 mg/dL 0.3  0.5  0.5   Alkaline Phos 38 - 126 U/L 136  122  63   AST 15 - 41 U/L 10  11  23    ALT 0 - 44 U/L 6  6  8      Lab Results  Component Value Date   WBC 4.9 03/25/2023   HGB  13.5 03/25/2023   HCT 39.8 03/25/2023   MCV 97.5 03/25/2023   PLT 233 03/25/2023   NEUTROABS 2.6 03/25/2023    ASSESSMENT & PLAN:  Malignant neoplasm of upper-outer quadrant of left breast in female, estrogen receptor positive (HCC) 10/07/2019:Left lumpectomy and sentinel lymph node biopsy Carolynne Edouard): IDC with DCIS, 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  ER 90%, PR 90%, HER-2 negative by FISH Posterior margin positive but no further surgery done because it is muscle on the back. T2N0  stage Ib Oncotype DX score 32: 20% risk of distant recurrence in 9 years   Treatment plan:  1. Adjuvant chemotherapy with dose dense Adriamycin and Cytoxan x4 followed by Taxol weekly x12 completed 03/27/21 2. followed by radiation therapy started 04/26/20 3.  Followed by adjuvant antiestrogen therapy.  Started February 2022 ------------------------------------------------------------------------------------------------------------   Right neck swelling CT neck 11/12/2022: 9 mm level 5 lymph node, biopsy: Metastatic carcinoma consistent with breast primary, ER 95%, PR 80%, HER2 0-1+   Recommendation: PET CT scan 01/30/2023: 10 mm cervical LN, scattered pulm nodules below level of PET, Multiple bone mets Since there is distant metastatic disease then we will plan to treat her with Verzinio along with Faslodex started 02/04/2023   Abemaciclib toxicities:  Fatigue which is preventing her from staying as active as she used to be.  She is going to request an intermittent FMLA paperwork so that she can work less number of hours.  Bone metastasis: On Xgeva along with calcium and vitamin D Return to clinic monthly for follow-ups with labs.  Scans will be done at the end of January. ------------------------------------- Assessment and Plan    Metastatic Breast Cancer On Verzenio 100mg  for 2 months with fatigue as a side effect but no diarrhea. Tumor marker pending. -Increase Verzenio to full dose as  tolerated. -Order CT scan in 1 month to assess response to treatment.  Bone Pain Persistent despite monthly injections. -Continue current pain management regimen. -Next injection due on April 09, 2023.  Work-related Fatigue Patient considering reducing work week due to fatigue. -Advise patient to inquire about intermittent FMLA at work to protect her position.  Follow-up -See patient in clinic in 2 weeks. -Ensure patient's appointments are correctly scheduled.          Orders Placed This Encounter  Procedures   CT CHEST ABDOMEN PELVIS W CONTRAST    Standing Status:   Future    Expected Date:   04/29/2023    Expiration Date:   03/24/2024    If indicated for the ordered procedure, I authorize the administration of contrast media per Radiology protocol:   Yes    Does the patient have a contrast media/X-ray dye allergy?:   No    Is patient pregnant?:   No    Preferred imaging location?:   [facility]    Release to patient:   Immediate    If indicated for the ordered procedure, I authorize the administration of oral contrast media per Radiology protocol:   Yes   The patient has a good understanding of the overall plan. she agrees with it. she will call with any problems that may develop before the next visit here. Total time spent: 30 mins including face to face time and time spent for planning, charting and co-ordination of care   Tamsen Meek, MD 03/25/23

## 2023-03-26 LAB — CANCER ANTIGEN 27.29: CA 27.29: 93.6 U/mL — ABNORMAL HIGH (ref 0.0–38.6)

## 2023-03-26 MED ORDER — HYDROCODONE-ACETAMINOPHEN 5-325 MG PO TABS: 1.0000 | ORAL_TABLET | Freq: Four times a day (QID) | ORAL | 0 refills | Status: DC | PRN

## 2023-03-28 ENCOUNTER — Other Ambulatory Visit: Payer: Self-pay | Admitting: Family Medicine

## 2023-04-09 ENCOUNTER — Inpatient Hospital Stay: Payer: Medicaid Other

## 2023-04-09 ENCOUNTER — Other Ambulatory Visit (HOSPITAL_COMMUNITY): Payer: Self-pay

## 2023-04-09 ENCOUNTER — Other Ambulatory Visit: Payer: Self-pay | Admitting: Hematology and Oncology

## 2023-04-09 ENCOUNTER — Inpatient Hospital Stay: Payer: Medicaid Other | Attending: Hematology and Oncology

## 2023-04-09 ENCOUNTER — Inpatient Hospital Stay: Payer: Medicaid Other | Admitting: Pharmacist

## 2023-04-09 ENCOUNTER — Ambulatory Visit: Payer: 59

## 2023-04-09 ENCOUNTER — Other Ambulatory Visit: Payer: Self-pay

## 2023-04-09 VITALS — BP 118/60 | HR 71 | Temp 97.6°F | Resp 17 | Wt 128.6 lb

## 2023-04-09 VITALS — BP 124/66 | HR 72 | Temp 98.9°F | Resp 17

## 2023-04-09 DIAGNOSIS — K2970 Gastritis, unspecified, without bleeding: Secondary | ICD-10-CM | POA: Diagnosis not present

## 2023-04-09 DIAGNOSIS — Z9221 Personal history of antineoplastic chemotherapy: Secondary | ICD-10-CM | POA: Insufficient documentation

## 2023-04-09 DIAGNOSIS — C50919 Malignant neoplasm of unspecified site of unspecified female breast: Secondary | ICD-10-CM

## 2023-04-09 DIAGNOSIS — Z923 Personal history of irradiation: Secondary | ICD-10-CM | POA: Insufficient documentation

## 2023-04-09 DIAGNOSIS — Z79818 Long term (current) use of other agents affecting estrogen receptors and estrogen levels: Secondary | ICD-10-CM | POA: Insufficient documentation

## 2023-04-09 DIAGNOSIS — Z79899 Other long term (current) drug therapy: Secondary | ICD-10-CM | POA: Insufficient documentation

## 2023-04-09 DIAGNOSIS — J439 Emphysema, unspecified: Secondary | ICD-10-CM | POA: Insufficient documentation

## 2023-04-09 DIAGNOSIS — C50412 Malignant neoplasm of upper-outer quadrant of left female breast: Secondary | ICD-10-CM | POA: Insufficient documentation

## 2023-04-09 DIAGNOSIS — K5900 Constipation, unspecified: Secondary | ICD-10-CM | POA: Insufficient documentation

## 2023-04-09 DIAGNOSIS — Z5111 Encounter for antineoplastic chemotherapy: Secondary | ICD-10-CM | POA: Insufficient documentation

## 2023-04-09 DIAGNOSIS — C7951 Secondary malignant neoplasm of bone: Secondary | ICD-10-CM | POA: Insufficient documentation

## 2023-04-09 DIAGNOSIS — Z170 Estrogen receptor positive status [ER+]: Secondary | ICD-10-CM | POA: Diagnosis not present

## 2023-04-09 DIAGNOSIS — K760 Fatty (change of) liver, not elsewhere classified: Secondary | ICD-10-CM | POA: Insufficient documentation

## 2023-04-09 DIAGNOSIS — Z9012 Acquired absence of left breast and nipple: Secondary | ICD-10-CM | POA: Diagnosis not present

## 2023-04-09 LAB — CMP (CANCER CENTER ONLY)
ALT: 5 U/L (ref 0–44)
AST: 9 U/L — ABNORMAL LOW (ref 15–41)
Albumin: 4.1 g/dL (ref 3.5–5.0)
Alkaline Phosphatase: 113 U/L (ref 38–126)
Anion gap: 5 (ref 5–15)
BUN: 9 mg/dL (ref 6–20)
CO2: 25 mmol/L (ref 22–32)
Calcium: 8.6 mg/dL — ABNORMAL LOW (ref 8.9–10.3)
Chloride: 109 mmol/L (ref 98–111)
Creatinine: 0.69 mg/dL (ref 0.44–1.00)
GFR, Estimated: >60 mL/min (ref >60)
Glucose, Bld: 117 mg/dL — ABNORMAL HIGH (ref 70–99)
Potassium: 4.4 mmol/L (ref 3.5–5.1)
Sodium: 139 mmol/L (ref 135–145)
Total Bilirubin: 0.3 mg/dL (ref 0.0–1.2)
Total Protein: 6.9 g/dL (ref 6.5–8.1)

## 2023-04-09 LAB — CBC WITH DIFFERENTIAL (CANCER CENTER ONLY)
Abs Immature Granulocytes: 0.02 10*3/uL (ref 0.00–0.07)
Basophils Absolute: 0.1 10*3/uL (ref 0.0–0.1)
Basophils Relative: 1 %
Eosinophils Absolute: 0.1 10*3/uL (ref 0.0–0.5)
Eosinophils Relative: 3 %
HCT: 40.3 % (ref 36.0–46.0)
Hemoglobin: 13.9 g/dL (ref 12.0–15.0)
Immature Granulocytes: 0 %
Lymphocytes Relative: 35 %
Lymphs Abs: 1.8 10*3/uL (ref 0.7–4.0)
MCH: 33.4 pg (ref 26.0–34.0)
MCHC: 34.5 g/dL (ref 30.0–36.0)
MCV: 96.9 fL (ref 80.0–100.0)
Monocytes Absolute: 0.3 10*3/uL (ref 0.1–1.0)
Monocytes Relative: 6 %
Neutro Abs: 2.8 10*3/uL (ref 1.7–7.7)
Neutrophils Relative %: 55 %
Platelet Count: 222 10*3/uL (ref 150–400)
RBC: 4.16 MIL/uL (ref 3.87–5.11)
RDW: 15.3 % (ref 11.5–15.5)
WBC Count: 5.1 10*3/uL (ref 4.0–10.5)
nRBC: 0 % (ref 0.0–0.2)

## 2023-04-09 MED ORDER — FULVESTRANT 250 MG/5ML IM SOSY
500.0000 mg | PREFILLED_SYRINGE | Freq: Once | INTRAMUSCULAR | Status: AC
  Administered 2023-04-09: 500 mg via INTRAMUSCULAR
  Filled 2023-04-09: qty 10

## 2023-04-09 MED ORDER — RADIAPLEXRX EX GEL
1.0000 | Freq: Every day | CUTANEOUS | 3 refills | Status: AC
  Filled 2023-04-09 – 2023-04-10 (×4): qty 170, 170d supply, fill #0

## 2023-04-09 MED ORDER — OYSTER SHELL CALCIUM/D3 500-5 MG-MCG PO TABS: 1.0000 | ORAL_TABLET | Freq: Every day | ORAL | 5 refills | Status: AC

## 2023-04-09 NOTE — Progress Notes (Signed)
 [facility]       Telephone: [phone]?Fax: [phone]   Oncology Clinical Pharmacist Practitioner Progress Note  Julia [name] was contacted via in-person to discuss her chemotherapy regimen for abemaciclib  which they receive under the care of Dr. Vinay Gudena.  Current treatment regimen and start date Abemaciclib  (02/07/23) 150 mg BID (03/25/23) 100 mg BID (02/07/23) Fulvestrant  (02/11/23) Denosumab  120 mg (02/11/23)  Interval History She continues on abemaciclib  150 mg by mouth every 12 hours on days 1 to 28 of a 28-day cycle. This is being given in combination with fulvestrant  and denosumab  . Therapy is planned to continue until disease progression or unacceptable toxicity. [name] is here today as a follow up to her abemaciclib  management. She last saw Dr. Gudena on 03/25/23 and clinical pharmacy on 03/11/23. Abemaciclib  was increased by Dr. [name] on 03/25/23.  Response to Therapy Ms. Clover is tolerating the increased dose of abemaciclib  very well. Her calcium  was slightly low today so we are sending a calcium /vitamin d supplement to her pharmacy of choice. She had fulvestrant  today and will receive again in 4 weeks when she is next due for denosumab  as well. She will have restaging scans and see Dr. Gudena that day too. Labs, vitals, treatment parameters, and manufacturer guidelines assessing toxicity were reviewed with [name] today. Based on these values, patient is in agreement to continue abemaciclib  therapy at this time.  Allergies Allergies  Allergen Reactions   Propoxyphene N-Acetaminophen  Hives   Nabumetone Rash    She can tolerate ibuprofen  w/o difficulty   Rofecoxib Rash    Nightmares (vioxx)    Vitals    04/09/2023    9:04 AM 04/09/2023    7:47 AM 03/25/2023    8:28 AM  Oncology Vitals  Height   163 cm  Weight 58.333 kg  58.423 kg  Weight (lbs) 128 lbs 10 oz  128 lbs 13 oz  BMI 22.07 kg/m2  22.11 kg/m2  Temp 97.6 F (36.4 C) 98.9 F  (37.2 C) 97.6 F (36.4 C)  Pulse Rate 71 72 75  BP 118/60 124/66 126/57  Resp 17 17 18   SpO2 100 % 100 % 100 %  BSA (m2) 1.62 m2  1.62 m2    Laboratory Data    Latest Ref Rng & Units 04/09/2023    8:05 AM 03/25/2023    8:08 AM 03/11/2023    8:14 AM  CBC EXTENDED  WBC 4.0 - 10.5 K/uL 5.1  4.9  4.5   RBC 3.87 - 5.11 MIL/uL 4.16  4.08  4.20   Hemoglobin 12.0 - 15.0 g/dL 86.0  86.4  86.3   HCT 36.0 - 46.0 % 40.3  39.8  39.6   Platelets 150 - 400 K/uL 222  233  220   NEUT# 1.7 - 7.7 K/uL 2.8  2.6  2.5   Lymph# 0.7 - 4.0 K/uL 1.8  1.8  1.6        Latest Ref Rng & Units 04/09/2023    8:05 AM 03/25/2023    8:08 AM 03/11/2023    8:14 AM  CMP  Glucose 70 - 99 mg/dL 882  898  889   BUN 6 - 20 mg/dL 9  13  11    Creatinine 0.44 - 1.00 mg/dL 9.30  9.32  9.30   Sodium 135 - 145 mmol/L 139  138  138   Potassium 3.5 - 5.1 mmol/L 4.4  4.7  4.4   Chloride 98 - 111  mmol/L 109  108  108   CO2 22 - 32 mmol/L 25  25  25    Calcium  8.9 - 10.3 mg/dL 8.6  8.5  9.2   Total Protein 6.5 - 8.1 g/dL 6.9  6.6  7.1   Total Bilirubin 0.0 - 1.2 mg/dL 0.3  0.3  0.5   Alkaline Phos 38 - 126 U/L 113  136  122   AST 15 - 41 U/L 9  10  11    ALT 0 - 44 U/L 5  6  6      Lab Results  Component Value Date   MG 1.9 08/03/2020   Lab Results  Component Value Date   CA2729 93.6 (H) 03/25/2023   CA2729 90.0 (H) 03/11/2023   CA2729 77.6 (H) 02/04/2023     Adverse Effects Assessment Calcium  today at 8.6 mg/dL. Likely from denosumab . Sent calcium /vitamin d supplement  Adherence Assessment [name] reports missing 0 doses over the past 2 weeks.   Reason for missed dose: N/A Patient was re-educated on importance of adherence.   Access Assessment [name] is currently receiving her abemaciclib  through [facility]  Insurance concerns:  none  Medication Reconciliation The patient's medication list was reviewed today with the patient? Yes New medications or herbal supplements  have recently been started? No  Any medications have been discontinued? No  The medication list was updated and reconciled based on the patient's most recent medication list in the electronic medical record (EMR) including herbal products and OTC medications.   Medications Current Outpatient Medications  Medication Sig Dispense Refill   abemaciclib  (VERZENIO ) 150 MG tablet Take 1 tablet (150 mg total) by mouth 2 (two) times daily. 60 tablet 6   famotidine  (PEPCID ) 20 MG tablet TAKE 1 TABLET BY MOUTH TWICE A DAY 180 tablet 1   gabapentin  (NEURONTIN ) 300 MG capsule TAKE 2 CAPSULES BY MOUTH 3 TIMES DAILY. TAKE UP TO 4 CAPSULES A DAY (2 CAPSULES TWICE A DAY) 180 capsule 3   hyaluronate sodium (RADIAPLEXRX) GEL APPLY 1 APPLICATION TOPICALLY DAILY (Patient not taking: Reported on 03/11/2023) 170 g 3   HYDROcodone -acetaminophen  (NORCO) 5-325 MG tablet Take 1-2 tablets by mouth every 6 (six) hours as needed for severe pain (pain score 7-10). Sedation caution 90 tablet 0   ibuprofen  (ADVIL ) 600 MG tablet TAKE 1 TABLET (600 MG TOTAL) BY MOUTH EVERY 8 (EIGHT) HOURS AS NEEDED (FOR PAIN. TAKE WITH FOOD.). 90 tablet 0   omeprazole  (PRILOSEC ) 20 MG capsule TAKE 1 CAPSULE BY MOUTH EVERY OTHER DAY 90 capsule 1   SUMAtriptan  (IMITREX ) 50 MG tablet Take 1 tablet (50 mg total) by mouth every 2 (two) hours as needed for migraine (max 2 doses in 24 hours.). May repeat in 2 hours if headache persists or recurs. 10 tablet 2   tiZANidine  (ZANAFLEX ) 2 MG tablet Take 1 tablet (2 mg total) by mouth every 8 (eight) hours as needed for muscle spasms. 30 tablet 1   VENTOLIN  HFA 108 (90 Base) MCG/ACT inhaler TAKE 2 PUFFS BY MOUTH EVERY 6 HOURS AS NEEDED FOR WHEEZE OR SHORTNESS OF BREATH 18 each 1   No current facility-administered medications for this visit.    Drug-Drug Interactions (DDIs) DDIs were evaluated? Yes Significant DDIs? No  The patient was instructed to speak with their health care provider and/or the oral  chemotherapy pharmacist before starting any new drug, including prescription or over the counter, natural / herbal products, or vitamins.  Supportive Care Diarrhea: we  reviewed that diarrhea is common with abemaciclib  and confirmed that she does have loperamide (Imodium) at home.  We reviewed how to take this medication PRN. Neutropenia: we discussed the importance of having a thermometer and what the Centers for Disease Control and Prevention (CDC) considers a fever which is 100.43F (38C) or higher.  Gave patient 24/7 triage line to call if any fevers or symptoms. ILD/Pneumonitis: we reviewed potential symptoms including cough, shortness, and fatigue.  VTE: reviewed signs of DVT such as leg swelling, redness, pain, or tenderness and signs of PE such as shortness of breath, rapid or irregular heartbeat, cough, chest pain, or lightheadedness. Reviewed to take the medication every 12 hours (with food sometimes can be easier on the stomach) and to take it at the same time every day. Hepatotoxicity:WNL Drug interactions with grapefruit products  Dosing Assessment Hepatic adjustments needed? No  Renal adjustments needed? No  Toxicity adjustments needed? No  The current dosing regimen is appropriate to continue at this time.  Follow-Up Plan Continue abemaciclib  150 mg by mouth every 12 hours Continue fulvestrant  500 mg IM.Dose #3 today, then every 28 days thereafter. Next due 05/06/23 Continue denosumab  120 mg SubQ every 12 weeks. Next due 05/06/23 Start calcium /vitamin D supplement. Prescription sent today. Monitor calcium . Estimated at 8.6 mg/dL today. CA 27.29 will result tomorrow and go directly to Dr. [name] Restaging scans likely in early February per Dr. [name] Ms. [place] will let Dr. [name] know if she is interested in pain management/palliative care referral. Add labs, Dr. [name] visit, fulvestrant , denosumab  on 05/06/23  [name] participated in the discussion, expressed  understanding, and voiced agreement with the above plan. All questions were answered to her satisfaction. The patient was advised to contact the clinic at [phone] with any questions or concerns prior to her return visit.   I spent 30 minutes assessing and educating the patient.  Genora Arp A. Lucila, PharmD, BCOP, CPP  Norleen DELENA Lucila, RPH-CPP, 04/09/2023  8:37 AM   **Disclaimer: This note was dictated with voice recognition software. Similar sounding words can inadvertently be transcribed and this note may contain transcription errors which may not have been corrected upon publication of note.**

## 2023-04-10 ENCOUNTER — Other Ambulatory Visit: Payer: Self-pay

## 2023-04-10 ENCOUNTER — Encounter: Payer: Self-pay | Admitting: Hematology and Oncology

## 2023-04-10 ENCOUNTER — Other Ambulatory Visit (HOSPITAL_COMMUNITY): Payer: Self-pay

## 2023-04-10 LAB — CANCER ANTIGEN 27.29: CA 27.29: 84.7 U/mL — ABNORMAL HIGH (ref 0.0–38.6)

## 2023-04-10 MED ORDER — HYDROCODONE-ACETAMINOPHEN 5-325 MG PO TABS: 1.0000 | ORAL_TABLET | Freq: Four times a day (QID) | ORAL | 0 refills | Status: DC | PRN

## 2023-04-17 ENCOUNTER — Ambulatory Visit: Payer: Medicaid Other | Admitting: Family Medicine

## 2023-04-22 ENCOUNTER — Other Ambulatory Visit (HOSPITAL_COMMUNITY): Payer: Self-pay

## 2023-04-22 ENCOUNTER — Inpatient Hospital Stay: Payer: Medicaid Other

## 2023-04-25 ENCOUNTER — Other Ambulatory Visit: Payer: Self-pay | Admitting: Family Medicine

## 2023-04-25 ENCOUNTER — Other Ambulatory Visit: Payer: Self-pay | Admitting: Adult Health

## 2023-04-27 MED ORDER — IBUPROFEN 600 MG PO TABS: 600.0000 mg | ORAL_TABLET | Freq: Three times a day (TID) | ORAL | 0 refills | Status: DC | PRN

## 2023-04-27 MED ORDER — HYDROCODONE-ACETAMINOPHEN 5-325 MG PO TABS: 1.0000 | ORAL_TABLET | Freq: Four times a day (QID) | ORAL | 0 refills | Status: DC | PRN

## 2023-04-27 NOTE — Telephone Encounter (Signed)
Refill request for ibuprofen (ADVIL) 600 MG tablet   LOV - 12/31/21 Next OV - not scheduled Last refill - 03/30/23 #90/0

## 2023-04-29 ENCOUNTER — Other Ambulatory Visit: Payer: Self-pay | Admitting: Family Medicine

## 2023-04-29 ENCOUNTER — Ambulatory Visit (HOSPITAL_COMMUNITY)
Admission: RE | Admit: 2023-04-29 | Discharge: 2023-04-29 | Disposition: A | Discharge status: Home / Self Care | Payer: Medicaid Other | Source: Ambulatory Visit | Attending: Hematology and Oncology | Admitting: Hematology and Oncology

## 2023-04-29 DIAGNOSIS — C50412 Malignant neoplasm of upper-outer quadrant of left female breast: Secondary | ICD-10-CM | POA: Insufficient documentation

## 2023-04-29 DIAGNOSIS — R918 Other nonspecific abnormal finding of lung field: Secondary | ICD-10-CM | POA: Diagnosis not present

## 2023-04-29 DIAGNOSIS — Z170 Estrogen receptor positive status [ER+]: Secondary | ICD-10-CM | POA: Diagnosis not present

## 2023-04-29 DIAGNOSIS — K760 Fatty (change of) liver, not elsewhere classified: Secondary | ICD-10-CM | POA: Diagnosis not present

## 2023-04-29 DIAGNOSIS — C50912 Malignant neoplasm of unspecified site of left female breast: Secondary | ICD-10-CM | POA: Diagnosis not present

## 2023-04-29 DIAGNOSIS — J439 Emphysema, unspecified: Secondary | ICD-10-CM | POA: Diagnosis not present

## 2023-04-29 MED ORDER — IOHEXOL 300 MG/ML  SOLN
100.0000 mL | Freq: Once | INTRAMUSCULAR | Status: AC | PRN
  Administered 2023-04-29: 100 mL via INTRAVENOUS

## 2023-04-29 NOTE — Telephone Encounter (Signed)
Last office visit: 12/31/2021  Next office visit: nothing scheduled Last refill: tiZANidine (ZANAFLEX) 2 MG tablet 12/01/22 30 tablets 1 refill

## 2023-05-06 ENCOUNTER — Inpatient Hospital Stay: Payer: Medicaid Other

## 2023-05-06 ENCOUNTER — Inpatient Hospital Stay (HOSPITAL_BASED_OUTPATIENT_CLINIC_OR_DEPARTMENT_OTHER): Payer: Medicaid Other | Admitting: Hematology and Oncology

## 2023-05-06 ENCOUNTER — Other Ambulatory Visit: Payer: Self-pay | Admitting: Hematology and Oncology

## 2023-05-06 VITALS — BP 103/52 | HR 57 | Temp 97.7°F | Resp 18 | Ht 64.0 in | Wt 127.4 lb

## 2023-05-06 DIAGNOSIS — Z170 Estrogen receptor positive status [ER+]: Secondary | ICD-10-CM

## 2023-05-06 DIAGNOSIS — Z79899 Other long term (current) drug therapy: Secondary | ICD-10-CM | POA: Diagnosis not present

## 2023-05-06 DIAGNOSIS — Z79818 Long term (current) use of other agents affecting estrogen receptors and estrogen levels: Secondary | ICD-10-CM | POA: Diagnosis not present

## 2023-05-06 DIAGNOSIS — K5900 Constipation, unspecified: Secondary | ICD-10-CM | POA: Diagnosis not present

## 2023-05-06 DIAGNOSIS — C7951 Secondary malignant neoplasm of bone: Secondary | ICD-10-CM | POA: Diagnosis not present

## 2023-05-06 DIAGNOSIS — C50412 Malignant neoplasm of upper-outer quadrant of left female breast: Secondary | ICD-10-CM | POA: Diagnosis not present

## 2023-05-06 DIAGNOSIS — C50919 Malignant neoplasm of unspecified site of unspecified female breast: Secondary | ICD-10-CM

## 2023-05-06 DIAGNOSIS — Z9221 Personal history of antineoplastic chemotherapy: Secondary | ICD-10-CM | POA: Diagnosis not present

## 2023-05-06 DIAGNOSIS — K2970 Gastritis, unspecified, without bleeding: Secondary | ICD-10-CM | POA: Diagnosis not present

## 2023-05-06 DIAGNOSIS — Z9012 Acquired absence of left breast and nipple: Secondary | ICD-10-CM | POA: Diagnosis not present

## 2023-05-06 DIAGNOSIS — Z5111 Encounter for antineoplastic chemotherapy: Secondary | ICD-10-CM | POA: Diagnosis not present

## 2023-05-06 DIAGNOSIS — J439 Emphysema, unspecified: Secondary | ICD-10-CM | POA: Diagnosis not present

## 2023-05-06 DIAGNOSIS — Z923 Personal history of irradiation: Secondary | ICD-10-CM | POA: Diagnosis not present

## 2023-05-06 DIAGNOSIS — K760 Fatty (change of) liver, not elsewhere classified: Secondary | ICD-10-CM | POA: Diagnosis not present

## 2023-05-06 LAB — CMP (CANCER CENTER ONLY)
ALT: 6 U/L (ref 0–44)
AST: 10 U/L — ABNORMAL LOW (ref 15–41)
Albumin: 4.3 g/dL (ref 3.5–5.0)
Alkaline Phosphatase: 74 U/L (ref 38–126)
Anion gap: 5 (ref 5–15)
BUN: 12 mg/dL (ref 6–20)
CO2: 24 mmol/L (ref 22–32)
Calcium: 9 mg/dL (ref 8.9–10.3)
Chloride: 109 mmol/L (ref 98–111)
Creatinine: 0.72 mg/dL (ref 0.44–1.00)
GFR, Estimated: >60 mL/min (ref >60)
Glucose, Bld: 128 mg/dL — ABNORMAL HIGH (ref 70–99)
Potassium: 4.6 mmol/L (ref 3.5–5.1)
Sodium: 138 mmol/L (ref 135–145)
Total Bilirubin: 0.5 mg/dL (ref 0.0–1.2)
Total Protein: 6.9 g/dL (ref 6.5–8.1)

## 2023-05-06 LAB — CBC WITH DIFFERENTIAL (CANCER CENTER ONLY)
Abs Immature Granulocytes: 0.01 10*3/uL (ref 0.00–0.07)
Basophils Absolute: 0.1 10*3/uL (ref 0.0–0.1)
Basophils Relative: 1 %
Eosinophils Absolute: 0.1 10*3/uL (ref 0.0–0.5)
Eosinophils Relative: 2 %
HCT: 38 % (ref 36.0–46.0)
Hemoglobin: 13.2 g/dL (ref 12.0–15.0)
Immature Granulocytes: 0 %
Lymphocytes Relative: 37 %
Lymphs Abs: 1.8 10*3/uL (ref 0.7–4.0)
MCH: 34.8 pg — ABNORMAL HIGH (ref 26.0–34.0)
MCHC: 34.7 g/dL (ref 30.0–36.0)
MCV: 100.3 fL — ABNORMAL HIGH (ref 80.0–100.0)
Monocytes Absolute: 0.4 10*3/uL (ref 0.1–1.0)
Monocytes Relative: 8 %
Neutro Abs: 2.4 10*3/uL (ref 1.7–7.7)
Neutrophils Relative %: 52 %
Platelet Count: 206 10*3/uL (ref 150–400)
RBC: 3.79 MIL/uL — ABNORMAL LOW (ref 3.87–5.11)
RDW: 15.1 % (ref 11.5–15.5)
WBC Count: 4.8 10*3/uL (ref 4.0–10.5)
nRBC: 0 % (ref 0.0–0.2)

## 2023-05-06 MED ORDER — HYDROCODONE-ACETAMINOPHEN 5-325 MG PO TABS: 1.0000 | ORAL_TABLET | Freq: Four times a day (QID) | ORAL | 0 refills | Status: DC | PRN

## 2023-05-06 MED ORDER — DENOSUMAB 120 MG/1.7ML ~~LOC~~ SOLN
120.0000 mg | Freq: Once | SUBCUTANEOUS | Status: AC
  Administered 2023-05-06: 120 mg via SUBCUTANEOUS
  Filled 2023-05-06: qty 1.7

## 2023-05-06 MED ORDER — FULVESTRANT 250 MG/5ML IM SOSY
500.0000 mg | PREFILLED_SYRINGE | Freq: Once | INTRAMUSCULAR | Status: AC
  Administered 2023-05-06: 500 mg via INTRAMUSCULAR
  Filled 2023-05-06: qty 10

## 2023-05-06 NOTE — Progress Notes (Signed)
Patient Care Team: Joaquim Nam, MD as PCP - General (Family Medicine) Jim Like, RN as Registered Nurse Jim Like, RN as Registered Nurse Pershing Proud, RN as Oncology Nurse Navigator Donnelly Angelica, RN as Oncology Nurse Navigator Dorothy Puffer, MD as Consulting Physician (Radiation Oncology) Serena Croissant, MD as Consulting Physician (Hematology and Oncology) Griselda Miner, MD as Consulting Physician (General Surgery) Anselm Lis, RPH-CPP as Pharmacist (Hematology and Oncology)  DIAGNOSIS:  Encounter Diagnosis  Name Primary?   Malignant neoplasm of upper-outer quadrant of left breast in female, estrogen receptor positive (HCC) Yes    SUMMARY OF ONCOLOGIC HISTORY: Oncology History  Malignant neoplasm of upper-outer quadrant of left breast in female, estrogen receptor positive (HCC)  08/25/2019 Initial Diagnosis   Palpable left breast mass x1 month. Diagnostic mammogram and [place] on 08/18/19 showed a 2.8cm mass at the 2 o'clock position, and one lymph node with mild cortical thickening in the left axilla. Biopsy on 08/25/19 showed invasive mammary carcinoma in the breast, grade 3, HER-2 equivocal by IHC (2+) negative by FISH, ER+ >90%, PR+ >90%, left axilla negative   10/07/2019 Surgery   Left lumpectomy and sentinel lymph node biopsy Carolynne Edouard) [phone]): Grade 3 IDC with DCIS, 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   10/18/2019 Cancer Staging   Staging form: Breast, AJCC 8th Edition - Pathologic stage from 10/18/2019: Stage IB (pT2, pN0(sn), cM0, G3, ER+, PR+, HER2-)   10/21/2019 Oncotype testing   The Oncotype DX score was 32 predicting a risk of outside the breast recurrence over the next 9 years of 20% if the patient's only systemic therapy is tamoxifen for 5 years.    11/15/2019 - 03/27/2020 Chemotherapy   Adjuvant chemotherapy with dose dense Adriamycin and Cytoxan x4 (11/15/2019 - 12/27/2019) followed by Taxol weekly x12 (01/09/2020 -  03/27/20).   04/25/2020 - 05/25/2020 Radiation Therapy   The patient initially received a dose of 42.56 Gy in 16 fractions to the breast using whole-breast tangent fields. This was delivered using a 3-D conformal technique. The pt received a boost delivering an additional 8 Gy in 4 fractions using a electron boost with electrons. The total dose was 50.56 Gy.   05/2020 -  Anti-estrogen oral therapy   Anastrozole   05/06/2021 Surgery   Left mastectomy: Benign (performed because of left breast fibrosis and pain)     CHIEF COMPLIANT: Follow-up to review CT scans and on Faslodex with Verzenio  HISTORY OF PRESENT ILLNESS: Discussed the use of AI scribe software for clinical note transcription with the patient, who gave verbal consent to proceed.  History of Present Illness   The patient, with metastatic bone cancer, presents for follow-up regarding her cancer treatment and recent CT scan results.  The CT scan showed no new spots, and the existing bone lesions have become more sclerotic. Tiny nodules in the lungs remain unchanged, and there is some emphysema noted. She experiences ongoing fatigue, which she attributes to her medication. Despite this, she wants to engage in new activities, such as going on a cruise.  She experiences heartburn that radiates to her stomach and takes Prilosec for management. She had an episode of constipation last week, which resolved after taking a remedy. No new bowel issues aside from this recent episode, and she denies any chronic constipation.  She receives injections as part of her treatment, which are painful when administered. She is currently managing her work situation with FMLA paperwork due to her  health condition.         ALLERGIES:  is allergic to propoxyphene n-acetaminophen, nabumetone, and rofecoxib.  MEDICATIONS:  Current Outpatient Medications  Medication Sig Dispense Refill   abemaciclib (VERZENIO) 150 MG tablet Take 1 tablet (150 mg  total) by mouth 2 (two) times daily. 60 tablet 6   calcium-vitamin D (OSCAL WITH D) 500-5 MG-MCG tablet Take 1 tablet by mouth daily. 30 tablet 5   famotidine (PEPCID) 20 MG tablet TAKE 1 TABLET BY MOUTH TWICE A DAY 180 tablet 1   gabapentin (NEURONTIN) 300 MG capsule TAKE 2 CAPSULES BY MOUTH 3 TIMES DAILY. TAKE UP TO 4 CAPSULES A DAY (2 CAPSULES TWICE A DAY) 180 capsule 3   hyaluronate sodium (RADIAPLEXRX) GEL Apply topically to affected area daily as directed. 170 g 3   HYDROcodone-acetaminophen (NORCO) 5-325 MG tablet Take 1-2 tablets by mouth every 6 (six) hours as needed for severe pain (pain score 7-10). Sedation caution 90 tablet 0   ibuprofen (ADVIL) 600 MG tablet Take 1 tablet (600 mg total) by mouth every 8 (eight) hours as needed. PLEASE CONTACT [facility] FOR AN APPOINTMENT WHEN POSSIBLE.  CALL [phone] OR USE MYCHART. 90 tablet 0   omeprazole (PRILOSEC) 20 MG capsule TAKE 1 CAPSULE BY MOUTH EVERY OTHER DAY 90 capsule 1   SUMAtriptan (IMITREX) 50 MG tablet Take 1 tablet (50 mg total) by mouth every 2 (two) hours as needed for migraine (max 2 doses in 24 hours.). May repeat in 2 hours if headache persists or recurs. 10 tablet 2   tiZANidine (ZANAFLEX) 2 MG tablet TAKE 1 TABLET BY MOUTH EVERY 8 HOURS AS NEEDED FOR MUSCLE SPASM 30 tablet 0   VENTOLIN HFA 108 (90 Base) MCG/ACT inhaler TAKE 2 PUFFS BY MOUTH EVERY 6 HOURS AS NEEDED FOR WHEEZE OR SHORTNESS OF BREATH 18 each 1   No current facility-administered medications for this visit.   Facility-Administered Medications Ordered in Other Visits  Medication Dose Route Frequency Provider Last Rate Last Admin   denosumab (XGEVA) injection 120 mg  120 mg Subcutaneous Once Serena Croissant, MD       fulvestrant (FASLODEX) injection 500 mg  500 mg Intramuscular Once Serena Croissant, MD        PHYSICAL EXAMINATION: ECOG PERFORMANCE STATUS: 1 - Symptomatic but completely ambulatory  Vitals:   05/06/23 0858  BP: (!) 103/52  Pulse:  (!) 57  Resp: 18  Temp: 97.7 F (36.5 C)  SpO2: 98%   Filed Weights   05/06/23 0858  Weight: 127 lb 6.4 oz (57.8 kg)    Physical Exam          (exam performed in the presence of a chaperone)  LABORATORY DATA:  I have reviewed the data as listed    Latest Ref Rng & Units 05/06/2023    7:55 AM 04/09/2023    8:05 AM 03/25/2023    8:08 AM  CMP  Glucose 70 - 99 mg/dL 696  295  284   BUN 6 - 20 mg/dL 12  9  13    Creatinine 0.44 - 1.00 mg/dL 1.32  4.40  1.02   Sodium 135 - 145 mmol/L 138  139  138   Potassium 3.5 - 5.1 mmol/L 4.6  4.4  4.7   Chloride 98 - 111 mmol/L 109  109  108   CO2 22 - 32 mmol/L 24  25  25    Calcium 8.9 - 10.3 mg/dL 9.0  8.6  8.5  Total Protein 6.5 - 8.1 g/dL 6.9  6.9  6.6   Total Bilirubin 0.0 - 1.2 mg/dL 0.5  0.3  0.3   Alkaline Phos 38 - 126 U/L 74  113  136   AST 15 - 41 U/L 10  9  10    ALT 0 - 44 U/L 6  5  6      Lab Results  Component Value Date   WBC 4.8 05/06/2023   HGB 13.2 05/06/2023   HCT 38.0 05/06/2023   MCV 100.3 (H) 05/06/2023   PLT 206 05/06/2023   NEUTROABS 2.4 05/06/2023    ASSESSMENT & PLAN:  Malignant neoplasm of upper-outer quadrant of left breast in female, estrogen receptor positive (HCC) 10/07/2019:Left lumpectomy and sentinel lymph node biopsy Carolynne Edouard): IDC with DCIS, 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  ER 90%, PR 90%, HER-2 negative by FISH Posterior margin positive but no further surgery done because it is muscle on the back. T2N0 stage Ib Oncotype DX score 32: 20% risk of distant recurrence in 9 years   Treatment plan:  1. Adjuvant chemotherapy with dose dense Adriamycin and Cytoxan x4 followed by Taxol weekly x12 completed 03/27/21 2. followed by radiation therapy started 04/26/20 3.  Followed by adjuvant antiestrogen therapy.  Started February 2022 ------------------------------------------------------------------------------------------------------------   Right neck swelling CT neck 11/12/2022: 9 mm  level 5 lymph node, biopsy: Metastatic carcinoma consistent with breast primary, ER 95%, PR 80%, HER2 0-1+   Recommendation: PET CT scan 01/30/2023: 10 mm cervical LN, scattered pulm nodules below level of PET, Multiple bone mets Since there is distant metastatic disease then we will plan to treat her with Verzinio along with Faslodex started 02/04/2023   Abemaciclib toxicities:  Fatigue which is preventing her from staying as active as she used to be.  She is going to request an intermittent FMLA paperwork so that she can work less number of hours.   Bone metastasis: On Xgeva along with calcium and vitamin D CT CAP 04/30/23: Bone mets more sclerotic (suggestive of response), lung nodules stable. Return to clinic monthly for follow-ups with labs.   ------------------------------------- Assessment and Plan    Metastatic Bone Disease CT scan shows no new lesions and existing bone lesions have become sclerotic, indicating scar tissue formation and suggesting that the cancer cells are dead. Tolerating current treatment regimen well, with some fatigue and occasional constipation. Blood work shows stable hemoglobin, platelets, and liver function, with slightly elevated blood sugar. CA27-29 marker has been fluctuating but is being monitored closely. Discussed that the average duration of this treatment is two years, with some patients responding for five years or more. Explained that bone lesions are harder to measure compared to other tumors, and the current findings are consistent with an average response. Discussed future treatment options if progression occurs, including multiple drugs that can extend survival by several years. - Continue current treatment regimen - Monitor [phone] marker - Schedule next CT scan in three months - Follow-up appointment next month  Emphysema CT scan shows some emphysema, consistent with previous findings. No new symptoms or exacerbations reported. - Continue  current management - Monitor for any new symptoms  Gastritis Reports heartburn and stomach pain, managed with Prilosec. CT scan suggests mild gastritis. - Continue Prilosec - Monitor symptoms and adjust treatment as needed  Fatty Liver CT scan indicates mild fatty liver. No specific symptoms or interventions required at this time. - Monitor liver function tests  General Health Maintenance Generally in good  health with stable blood work and no new significant findings on the CT scan. - Encourage engagement in desired activities, such as going on a cruise - Continue monitoring overall health and well-being  Follow-up - Follow-up appointment next month - Monitor CA27-29 marker results - Schedule next CT scan in three months.          No orders of the defined types were placed in this encounter.  The patient has a good understanding of the overall plan. she agrees with it. she will call with any problems that may develop before the next visit here. Total time spent: 30 mins including face to face time and time spent for planning, charting and co-ordination of care   Tamsen Meek, MD 05/06/23

## 2023-05-06 NOTE — Assessment & Plan Note (Signed)
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  ER 90%, PR 90%, HER-2 negative by FISH Posterior margin positive but no further surgery done because it is muscle on the back. T2N0 stage Ib Oncotype DX score 32: 20% risk of distant recurrence in 9 years   Treatment plan:  1. Adjuvant chemotherapy with dose dense Adriamycin and Cytoxan x4 followed by Taxol weekly x12 completed 03/27/21 2. followed by radiation therapy started 04/26/20 3.  Followed by adjuvant antiestrogen therapy.  Started February 2022 ------------------------------------------------------------------------------------------------------------   Right neck swelling CT neck 11/12/2022: 9 mm level 5 lymph node, biopsy: Metastatic carcinoma consistent with breast primary, ER 95%, PR 80%, HER2 0-1+   Recommendation: PET CT scan 01/30/2023: 10 mm cervical LN, scattered pulm nodules below level of PET, Multiple bone mets Since there is distant metastatic disease then we will plan to treat her with Verzinio along with Faslodex started 02/04/2023   Abemaciclib toxicities:  Fatigue which is preventing her from staying as active as she used to be.  She is going to request an intermittent FMLA paperwork so that she can work less number of hours.   Bone metastasis: On Xgeva along with calcium and vitamin D CT CAP 04/30/23: Bone mets more sclerotic (suggestive of response), lung nodules stable. Return to clinic monthly for follow-ups with labs.

## 2023-05-06 NOTE — Telephone Encounter (Signed)
Please refuse, you just filled this 1 week ago

## 2023-05-08 LAB — CANCER ANTIGEN 27.29: CA 27.29: 80.5 U/mL — ABNORMAL HIGH (ref 0.0–38.6)

## 2023-05-12 ENCOUNTER — Other Ambulatory Visit: Payer: Self-pay | Admitting: Family Medicine

## 2023-05-12 ENCOUNTER — Other Ambulatory Visit: Payer: Self-pay | Admitting: Hematology and Oncology

## 2023-05-13 MED ORDER — HYDROCODONE-ACETAMINOPHEN 5-325 MG PO TABS: 1.0000 | ORAL_TABLET | Freq: Four times a day (QID) | ORAL | 0 refills | Status: DC | PRN

## 2023-05-14 ENCOUNTER — Other Ambulatory Visit (HOSPITAL_COMMUNITY): Payer: Self-pay

## 2023-05-14 ENCOUNTER — Telehealth: Payer: Self-pay

## 2023-05-14 MED ORDER — TIZANIDINE HCL 2 MG PO TABS: 2.0000 mg | ORAL_TABLET | Freq: Three times a day (TID) | ORAL | 0 refills | Status: DC | PRN

## 2023-05-14 NOTE — Telephone Encounter (Signed)
 Last office Visit: 12/31/21 Next office visit: nothing scheduled Last refill date:  tiZANidine  (ZANAFLEX ) 2 MG tablet 04/29/2023 30 tablets 0 refills

## 2023-05-14 NOTE — Telephone Encounter (Signed)
 Sent. Thanks.  Please see about getting patient scheduled when possible.

## 2023-05-14 NOTE — Telephone Encounter (Signed)
 Notified patient of prior authorization approval for Hydrocodone  5/325 mg Tablets. Medication is approved through 11/10/2023. Pharmacy notified. No other needs or concerns noted at this time.

## 2023-05-15 NOTE — Telephone Encounter (Signed)
 Patient has been scheduled. She stated that her cancer has come back.

## 2023-05-15 NOTE — Telephone Encounter (Signed)
 FYI

## 2023-05-15 NOTE — Telephone Encounter (Signed)
 Please reach out to patient to schedule her. Thank you

## 2023-05-17 NOTE — Telephone Encounter (Signed)
 I am profoundly sorry to hear that- I had been following her cancer clinic notes.  Please try to schedule her appointment here in the way that is least disruptive to her.  Thanks.

## 2023-05-20 ENCOUNTER — Inpatient Hospital Stay: Payer: 59 | Admitting: Hematology and Oncology

## 2023-05-20 ENCOUNTER — Inpatient Hospital Stay: Payer: 59

## 2023-05-20 NOTE — Telephone Encounter (Signed)
Please schedule patient an appointment to see Dr. Para March. No rush in timeframe

## 2023-05-20 NOTE — Telephone Encounter (Signed)
pt has a f/u visit schedule

## 2023-05-28 ENCOUNTER — Ambulatory Visit: Payer: Medicaid Other | Admitting: Family Medicine

## 2023-05-28 ENCOUNTER — Other Ambulatory Visit: Payer: Self-pay | Admitting: Hematology and Oncology

## 2023-05-28 ENCOUNTER — Other Ambulatory Visit: Payer: Self-pay | Admitting: Family Medicine

## 2023-05-28 ENCOUNTER — Other Ambulatory Visit: Payer: Self-pay | Admitting: *Deleted

## 2023-05-28 DIAGNOSIS — C50412 Malignant neoplasm of upper-outer quadrant of left female breast: Secondary | ICD-10-CM

## 2023-05-28 DIAGNOSIS — C50919 Malignant neoplasm of unspecified site of unspecified female breast: Secondary | ICD-10-CM

## 2023-05-28 MED ORDER — TIZANIDINE HCL 2 MG PO TABS: 2.0000 mg | ORAL_TABLET | Freq: Three times a day (TID) | ORAL | 0 refills | Status: DC | PRN

## 2023-05-28 NOTE — Progress Notes (Signed)
Received call from pt requesting refill on Norco, last filled 05/14/23.  RN forwarded to NP for refill.  RN also offered pt palliative care appt to help with pain management.  Pt agreeable and referral placed per MD request.

## 2023-05-29 ENCOUNTER — Other Ambulatory Visit: Payer: Self-pay | Admitting: Family Medicine

## 2023-05-29 MED ORDER — HYDROCODONE-ACETAMINOPHEN 5-325 MG PO TABS
1.0000 | ORAL_TABLET | Freq: Four times a day (QID) | ORAL | 0 refills | Status: DC | PRN
Start: 2023-05-29 — End: 2023-06-10

## 2023-05-30 ENCOUNTER — Telehealth: Payer: Self-pay | Admitting: Hematology and Oncology

## 2023-05-30 NOTE — Telephone Encounter (Signed)
 Scheduled and rescheduled appointments per staff message. Patient is aware of all made and rescheduled appointments.

## 2023-06-03 ENCOUNTER — Ambulatory Visit: Payer: Medicaid Other

## 2023-06-03 ENCOUNTER — Other Ambulatory Visit: Payer: Medicaid Other

## 2023-06-03 ENCOUNTER — Inpatient Hospital Stay: Payer: Medicaid Other | Attending: Hematology and Oncology

## 2023-06-03 ENCOUNTER — Ambulatory Visit: Payer: Medicaid Other | Admitting: Hematology and Oncology

## 2023-06-03 VITALS — BP 148/65 | HR 68 | Temp 99.1°F | Resp 18

## 2023-06-03 DIAGNOSIS — C50919 Malignant neoplasm of unspecified site of unspecified female breast: Secondary | ICD-10-CM

## 2023-06-03 DIAGNOSIS — Z9012 Acquired absence of left breast and nipple: Secondary | ICD-10-CM | POA: Insufficient documentation

## 2023-06-03 DIAGNOSIS — Z1732 Human epidermal growth factor receptor 2 negative status: Secondary | ICD-10-CM | POA: Diagnosis not present

## 2023-06-03 DIAGNOSIS — Z1721 Progesterone receptor positive status: Secondary | ICD-10-CM | POA: Diagnosis not present

## 2023-06-03 DIAGNOSIS — Z9221 Personal history of antineoplastic chemotherapy: Secondary | ICD-10-CM | POA: Insufficient documentation

## 2023-06-03 DIAGNOSIS — C50412 Malignant neoplasm of upper-outer quadrant of left female breast: Secondary | ICD-10-CM | POA: Insufficient documentation

## 2023-06-03 DIAGNOSIS — Z79811 Long term (current) use of aromatase inhibitors: Secondary | ICD-10-CM | POA: Diagnosis not present

## 2023-06-03 DIAGNOSIS — Z5111 Encounter for antineoplastic chemotherapy: Secondary | ICD-10-CM | POA: Diagnosis present

## 2023-06-03 DIAGNOSIS — Z923 Personal history of irradiation: Secondary | ICD-10-CM | POA: Insufficient documentation

## 2023-06-03 DIAGNOSIS — Z170 Estrogen receptor positive status [ER+]: Secondary | ICD-10-CM | POA: Diagnosis not present

## 2023-06-03 MED ORDER — FULVESTRANT 250 MG/5ML IM SOSY
500.0000 mg | PREFILLED_SYRINGE | Freq: Once | INTRAMUSCULAR | Status: AC
  Administered 2023-06-03: 500 mg via INTRAMUSCULAR
  Filled 2023-06-03: qty 10

## 2023-06-04 ENCOUNTER — Ambulatory Visit: Payer: Medicaid Other | Admitting: Family Medicine

## 2023-06-09 NOTE — Progress Notes (Unsigned)
 Palliative Medicine [facility]  Telephone:(336) [phone] Fax:(336) [phone]   Name: Julia [name] Date: 06/09/2023 MRN: [id number]  DOB: 11-Oct-1965  Patient Care Team: Joaquim Nam, MD as PCP - General (Family Medicine) Jim Like, RN as Registered Nurse Jim Like, RN as Registered Nurse Pershing Proud, RN as Oncology Nurse Navigator Donnelly Angelica, RN as Oncology Nurse Navigator Dorothy Puffer, MD as Consulting Physician (Radiation Oncology) Serena Croissant, MD as Consulting Physician (Hematology and Oncology) Griselda Miner, MD as Consulting Physician (General Surgery) Anselm Lis, RPH-CPP as Pharmacist (Hematology and Oncology)    REASON FOR CONSULTATION: Julia [name] is a 58 y.o. 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 Palliative asked to see for symptom and pain management and goals of care.    SOCIAL HISTORY:     reports that she has been smoking cigarettes. She has a 62 pack-year smoking history. She has never used smokeless tobacco. She reports that she does not drink alcohol and does not use drugs.  ADVANCE DIRECTIVES:  None on file  CODE STATUS: Full code  PAST MEDICAL HISTORY: Past Medical History:  Diagnosis Date   Acute bronchitis 04/19/2021   Anxiety    over cancer diagnosis   Asthma    bronchial asthma    Cancer (HCC) 09/2019   left breast invasive mammary cancer   Chronic back pain    COPD (chronic obstructive pulmonary disease) (HCC)    smokes 1 1/2 ppd   Diabetes mellitus without complication (HCC)    GERD (gastroesophageal reflux disease)    Personal history of chemotherapy    Personal history of radiation therapy    Pneumonia    hx of 2018    Smoking     PAST SURGICAL HISTORY:  Past Surgical History:  Procedure Laterality Date   BREAST  BIOPSY Left 09/2019   x2   BREAST BIOPSY Left 11/22/2020   BREAST LUMPECTOMY Left 10/2019   BREAST LUMPECTOMY WITH SENTINEL LYMPH NODE BIOPSY Left 10/07/2019   Procedure: LEFT BREAST LUMPECTOMY WITH SENTINEL LYMPH NODE BX;  Surgeon: Griselda Miner, MD;  Location: [facility];  Service: General;  Laterality: Left;  PEC BLOCK   CHOLECYSTECTOMY     PORTACATH PLACEMENT Right 11/10/2019   Procedure: INSERTION PORT-A-CATH;  Surgeon: Griselda Miner, MD;  Location: [facility];  Service: General;  Laterality: Right;   TONSILLECTOMY     TOTAL MASTECTOMY Left 05/06/2021   Procedure: LEFT TOTAL MASTECTOMY;  Surgeon: Griselda Miner, MD;  Location: [facility] OR;  Service: General;  Laterality: Left;   VULVECTOMY N/A 01/30/2015   Procedure: WIDE LOCAL EXCISION VULVA;  Surgeon: Adolphus Birchwood, MD;  Location: [facility];  Service: Gynecology;  Laterality: N/A;    HEMATOLOGY/ONCOLOGY HISTORY:  Oncology History  Malignant neoplasm of upper-outer quadrant of left breast in female, estrogen receptor positive (HCC)  08/25/2019 Initial Diagnosis   Palpable left breast mass x1 month. Diagnostic mammogram and [place] on 08/18/19 showed a 2.8cm mass at the 2 o'clock position, and one lymph node with mild cortical thickening in the left axilla. Biopsy on 08/25/19 showed invasive mammary carcinoma in the breast, grade 3, HER-2 equivocal by IHC (2+) negative by FISH, ER+ >90%, PR+ >90%, left axilla negative   10/07/2019 Surgery   Left lumpectomy and sentinel lymph node biopsy Julia [name]) [phone]): Grade 3 IDC with  DCIS, 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   10/18/2019 Cancer Staging   Staging form: Breast, AJCC 8th Edition - Pathologic stage from 10/18/2019: Stage IB (pT2, pN0(sn), cM0, G3, ER+, PR+, HER2-)   10/21/2019 Oncotype testing   The Oncotype DX score was 32 predicting a risk of outside the breast recurrence over the next 9 years of 20% if the patient's only systemic therapy is tamoxifen for 5 years.     11/15/2019 - 03/27/2020 Chemotherapy   Adjuvant chemotherapy with dose dense Adriamycin and Cytoxan x4 (11/15/2019 - 12/27/2019) followed by Taxol weekly x12 (01/09/2020 - 03/27/20).   04/25/2020 - 05/25/2020 Radiation Therapy   The patient initially received a dose of 42.56 Gy in 16 fractions to the breast using whole-breast tangent fields. This was delivered using a 3-D conformal technique. The pt received a boost delivering an additional 8 Gy in 4 fractions using a electron boost with electrons. The total dose was 50.56 Gy.   05/2020 -  Anti-estrogen oral therapy   Anastrozole   05/06/2021 Surgery   Left mastectomy: Benign (performed because of left breast fibrosis and pain)     ALLERGIES:  is allergic to propoxyphene n-acetaminophen, nabumetone, and rofecoxib.  MEDICATIONS:  Current Outpatient Medications  Medication Sig Dispense Refill   abemaciclib (VERZENIO) 150 MG tablet Take 1 tablet (150 mg total) by mouth 2 (two) times daily. 60 tablet 6   calcium-vitamin D (OSCAL WITH D) 500-5 MG-MCG tablet Take 1 tablet by mouth daily. 30 tablet 5   famotidine (PEPCID) 20 MG tablet TAKE 1 TABLET BY MOUTH TWICE A DAY 180 tablet 1   gabapentin (NEURONTIN) 300 MG capsule TAKE 2 CAPSULES BY MOUTH 3 TIMES DAILY. TAKE UP TO 4 CAPSULES A DAY (2 CAPSULES TWICE A DAY) 180 capsule 3   hyaluronate sodium (RADIAPLEXRX) GEL Apply topically to affected area daily as directed. 170 g 3   HYDROcodone-acetaminophen (NORCO) 5-325 MG tablet Take 1-2 tablets by mouth every 6 (six) hours as needed for severe pain (pain score 7-10). Sedation caution 90 tablet 0   ibuprofen (ADVIL) 600 MG tablet TAKE 1 TABLET (600 MG TOTAL) BY MOUTH EVERY 8 (EIGHT) HOURS AS NEEDED. PLEASE CONTACT [place] [facility] FOR AN APPOINTMENT WHEN POSSIBLE. CALL [phone] OR USE MYCHART 90 tablet 0   omeprazole (PRILOSEC) 20 MG capsule TAKE 1 CAPSULE BY MOUTH EVERY OTHER DAY 45 capsule 3   SUMAtriptan (IMITREX) 50 MG tablet Take 1  tablet (50 mg total) by mouth every 2 (two) hours as needed for migraine (max 2 doses in 24 hours.). May repeat in 2 hours if headache persists or recurs. 10 tablet 2   tiZANidine (ZANAFLEX) 2 MG tablet Take 1 tablet (2 mg total) by mouth every 8 (eight) hours as needed for muscle spasms. 30 tablet 0   VENTOLIN HFA 108 (90 Base) MCG/ACT inhaler TAKE 2 PUFFS BY MOUTH EVERY 6 HOURS AS NEEDED FOR WHEEZE OR SHORTNESS OF BREATH 18 each 1   No current facility-administered medications for this visit.    VITAL SIGNS: There were no vitals taken for this visit. There were no vitals filed for this visit.  Estimated body mass index is 21.87 kg/m as calculated from the following:   Height as of 05/06/23: 5\' 4"  (1.626 m).   Weight as of 05/06/23: 127 lb 6.4 oz (57.8 kg).  LABS: CBC:    Component Value Date/Time   WBC 4.8 05/06/2023 0755   WBC 7.9 12/31/2021 1538  HGB 13.2 05/06/2023 0755   HCT 38.0 05/06/2023 0755   PLT 206 05/06/2023 0755   MCV 100.3 (H) 05/06/2023 0755   NEUTROABS 2.4 05/06/2023 0755   LYMPHSABS 1.8 05/06/2023 0755   MONOABS 0.4 05/06/2023 0755   EOSABS 0.1 05/06/2023 0755   BASOSABS 0.1 05/06/2023 0755   Comprehensive Metabolic Panel:    Component Value Date/Time   NA 138 05/06/2023 0755   K 4.6 05/06/2023 0755   CL 109 05/06/2023 0755   CO2 24 05/06/2023 0755   BUN 12 05/06/2023 0755   CREATININE 0.72 05/06/2023 0755   GLUCOSE 128 (H) 05/06/2023 0755   CALCIUM 9.0 05/06/2023 0755   AST 10 (L) 05/06/2023 0755   ALT 6 05/06/2023 0755   ALKPHOS 74 05/06/2023 0755   BILITOT 0.5 05/06/2023 0755   PROT 6.9 05/06/2023 0755   ALBUMIN 4.3 05/06/2023 0755    RADIOGRAPHIC STUDIES: No results found.  PERFORMANCE STATUS (ECOG) : {CHL ONC ECOG WJ:[id number]}  Review of Systems Unless otherwise noted, a complete review of systems is negative.  Physical Exam General: NAD Cardiovascular: regular rate and rhythm Pulmonary: clear ant fields Abdomen: soft, nontender,  + bowel sounds Extremities: no edema, no joint deformities Skin: no rashes Neurological: Alert and oriented x3  IMPRESSION: *** I introduced myself, Maygan RN, and Palliative's role in collaboration with the oncology team. Concept of Palliative Care was introduced as specialized medical care for people and their families living with serious illness.  It focuses on providing relief from the symptoms and stress of a serious illness.  The goal is to improve quality of life for both the patient and the family. Values and goals of care important to patient and family were attempted to be elicited.    We discussed *** current illness and what it means in the larger context of *** on-going co-morbidities. Natural disease trajectory and expectations were discussed.  I discussed the importance of continued conversation with family and their medical providers regarding overall plan of care and treatment options, ensuring decisions are within the context of the patients values and GOCs.  PLAN: Established therapeutic relationship. Education provided on palliative's role in collaboration with their Oncology/Radiation team. I will plan to see patient back in 2-4 weeks in collaboration to other oncology appointments.    Patient expressed understanding and was in agreement with this plan. She also understands that She can call the clinic at any time with any questions, concerns, or complaints.   Thank you for your referral and allowing Palliative to assist in Mrs. Ebonique Hallstrom Brinkley's care.   Number and complexity of problems addressed: ***HIGH - 1 or more chronic illnesses with SEVERE exacerbation, progression, or side effects of treatment - advanced cancer, pain. Any controlled substances utilized were prescribed in the context of palliative care.   Visit consisted of counseling and education dealing with the complex and emotionally intense issues of symptom management and palliative care in the setting of  serious and potentially life-threatening illness.  Signed by: Willette Alma, AGPCNP-BC Palliative Medicine Team/[facility]

## 2023-06-10 ENCOUNTER — Encounter: Payer: Self-pay | Admitting: Nurse Practitioner

## 2023-06-10 ENCOUNTER — Inpatient Hospital Stay: Payer: Medicaid Other | Attending: Hematology and Oncology | Admitting: Nurse Practitioner

## 2023-06-10 VITALS — BP 124/63 | HR 70 | Temp 97.4°F | Resp 15 | Wt 129.3 lb

## 2023-06-10 DIAGNOSIS — Z170 Estrogen receptor positive status [ER+]: Secondary | ICD-10-CM | POA: Insufficient documentation

## 2023-06-10 DIAGNOSIS — C7951 Secondary malignant neoplasm of bone: Secondary | ICD-10-CM | POA: Insufficient documentation

## 2023-06-10 DIAGNOSIS — C50919 Malignant neoplasm of unspecified site of unspecified female breast: Secondary | ICD-10-CM | POA: Diagnosis not present

## 2023-06-10 DIAGNOSIS — Z515 Encounter for palliative care: Secondary | ICD-10-CM | POA: Diagnosis not present

## 2023-06-10 DIAGNOSIS — G893 Neoplasm related pain (acute) (chronic): Secondary | ICD-10-CM | POA: Diagnosis not present

## 2023-06-10 DIAGNOSIS — Z9012 Acquired absence of left breast and nipple: Secondary | ICD-10-CM | POA: Insufficient documentation

## 2023-06-10 DIAGNOSIS — Z1721 Progesterone receptor positive status: Secondary | ICD-10-CM | POA: Insufficient documentation

## 2023-06-10 DIAGNOSIS — Z1732 Human epidermal growth factor receptor 2 negative status: Secondary | ICD-10-CM | POA: Insufficient documentation

## 2023-06-10 DIAGNOSIS — Z923 Personal history of irradiation: Secondary | ICD-10-CM | POA: Insufficient documentation

## 2023-06-10 DIAGNOSIS — Z9221 Personal history of antineoplastic chemotherapy: Secondary | ICD-10-CM | POA: Insufficient documentation

## 2023-06-10 DIAGNOSIS — M792 Neuralgia and neuritis, unspecified: Secondary | ICD-10-CM | POA: Diagnosis not present

## 2023-06-10 DIAGNOSIS — Z5111 Encounter for antineoplastic chemotherapy: Secondary | ICD-10-CM | POA: Insufficient documentation

## 2023-06-10 DIAGNOSIS — F17210 Nicotine dependence, cigarettes, uncomplicated: Secondary | ICD-10-CM | POA: Insufficient documentation

## 2023-06-10 DIAGNOSIS — C50412 Malignant neoplasm of upper-outer quadrant of left female breast: Secondary | ICD-10-CM | POA: Insufficient documentation

## 2023-06-10 MED ORDER — OXYCODONE-ACETAMINOPHEN 5-325 MG PO TABS
1.0000 | ORAL_TABLET | ORAL | 0 refills | Status: DC | PRN
Start: 2023-06-10 — End: 2023-06-25

## 2023-06-10 MED ORDER — GABAPENTIN 300 MG PO CAPS: 300.0000 mg | ORAL_CAPSULE | Freq: Two times a day (BID) | ORAL | 3 refills | Status: DC

## 2023-06-17 ENCOUNTER — Telehealth: Payer: Self-pay

## 2023-06-17 ENCOUNTER — Other Ambulatory Visit: Payer: Self-pay | Admitting: Nurse Practitioner

## 2023-06-17 DIAGNOSIS — C50412 Malignant neoplasm of upper-outer quadrant of left female breast: Secondary | ICD-10-CM

## 2023-06-17 DIAGNOSIS — Z515 Encounter for palliative care: Secondary | ICD-10-CM

## 2023-06-17 DIAGNOSIS — G893 Neoplasm related pain (acute) (chronic): Secondary | ICD-10-CM

## 2023-06-17 DIAGNOSIS — Z170 Estrogen receptor positive status [ER+]: Secondary | ICD-10-CM

## 2023-06-17 MED ORDER — OXYCODONE HCL ER 10 MG PO T12A: 10.0000 mg | EXTENDED_RELEASE_TABLET | Freq: Two times a day (BID) | ORAL | 0 refills | Status: DC

## 2023-06-17 NOTE — Telephone Encounter (Signed)
 This palliative RN called patient to check in and assess pain management. Patient reports that pain is better managed but is not ever go below a 5/10. Patient also mentioned that she was having a "rough time socially" right now and was tearful. Emotional support provided, patient declined a social work consult at this time. Per Lowella Bandy, NP, pt to start on OxyContin, education provided on how to take medication. Pt verbalized understanding of medication and when to call the office. No further needs at this time.

## 2023-06-25 ENCOUNTER — Other Ambulatory Visit: Payer: Self-pay | Admitting: Nurse Practitioner

## 2023-06-25 DIAGNOSIS — G893 Neoplasm related pain (acute) (chronic): Secondary | ICD-10-CM

## 2023-06-25 DIAGNOSIS — Z515 Encounter for palliative care: Secondary | ICD-10-CM

## 2023-06-25 DIAGNOSIS — C50919 Malignant neoplasm of unspecified site of unspecified female breast: Secondary | ICD-10-CM

## 2023-06-25 MED ORDER — OXYCODONE-ACETAMINOPHEN 5-325 MG PO TABS: 1.0000 | ORAL_TABLET | ORAL | 0 refills | Status: DC | PRN

## 2023-06-30 ENCOUNTER — Other Ambulatory Visit: Payer: Self-pay | Admitting: Family Medicine

## 2023-07-01 ENCOUNTER — Inpatient Hospital Stay: Payer: Medicaid Other

## 2023-07-01 ENCOUNTER — Encounter: Payer: Self-pay | Admitting: Hematology and Oncology

## 2023-07-01 ENCOUNTER — Inpatient Hospital Stay (HOSPITAL_BASED_OUTPATIENT_CLINIC_OR_DEPARTMENT_OTHER): Admitting: Nurse Practitioner

## 2023-07-01 ENCOUNTER — Encounter: Payer: Self-pay | Admitting: Nurse Practitioner

## 2023-07-01 VITALS — BP 135/62 | HR 60 | Temp 98.3°F | Resp 13 | Wt 127.9 lb

## 2023-07-01 DIAGNOSIS — C7951 Secondary malignant neoplasm of bone: Secondary | ICD-10-CM | POA: Diagnosis not present

## 2023-07-01 DIAGNOSIS — R630 Anorexia: Secondary | ICD-10-CM

## 2023-07-01 DIAGNOSIS — G893 Neoplasm related pain (acute) (chronic): Secondary | ICD-10-CM

## 2023-07-01 DIAGNOSIS — M792 Neuralgia and neuritis, unspecified: Secondary | ICD-10-CM | POA: Diagnosis not present

## 2023-07-01 DIAGNOSIS — Z1721 Progesterone receptor positive status: Secondary | ICD-10-CM | POA: Diagnosis not present

## 2023-07-01 DIAGNOSIS — F17210 Nicotine dependence, cigarettes, uncomplicated: Secondary | ICD-10-CM | POA: Diagnosis not present

## 2023-07-01 DIAGNOSIS — Z9012 Acquired absence of left breast and nipple: Secondary | ICD-10-CM | POA: Diagnosis not present

## 2023-07-01 DIAGNOSIS — Z923 Personal history of irradiation: Secondary | ICD-10-CM | POA: Diagnosis not present

## 2023-07-01 DIAGNOSIS — C50919 Malignant neoplasm of unspecified site of unspecified female breast: Secondary | ICD-10-CM

## 2023-07-01 DIAGNOSIS — Z170 Estrogen receptor positive status [ER+]: Secondary | ICD-10-CM | POA: Diagnosis not present

## 2023-07-01 DIAGNOSIS — Z7189 Other specified counseling: Secondary | ICD-10-CM | POA: Diagnosis not present

## 2023-07-01 DIAGNOSIS — Z515 Encounter for palliative care: Secondary | ICD-10-CM | POA: Diagnosis not present

## 2023-07-01 DIAGNOSIS — Z9221 Personal history of antineoplastic chemotherapy: Secondary | ICD-10-CM | POA: Diagnosis not present

## 2023-07-01 DIAGNOSIS — Z1732 Human epidermal growth factor receptor 2 negative status: Secondary | ICD-10-CM | POA: Diagnosis not present

## 2023-07-01 DIAGNOSIS — R634 Abnormal weight loss: Secondary | ICD-10-CM | POA: Diagnosis not present

## 2023-07-01 DIAGNOSIS — C50412 Malignant neoplasm of upper-outer quadrant of left female breast: Secondary | ICD-10-CM | POA: Diagnosis not present

## 2023-07-01 DIAGNOSIS — Z5111 Encounter for antineoplastic chemotherapy: Secondary | ICD-10-CM | POA: Diagnosis not present

## 2023-07-01 MED ORDER — FULVESTRANT 250 MG/5ML IM SOSY
500.0000 mg | PREFILLED_SYRINGE | Freq: Once | INTRAMUSCULAR | Status: AC
  Administered 2023-07-01: 500 mg via INTRAMUSCULAR
  Filled 2023-07-01: qty 10

## 2023-07-01 NOTE — Progress Notes (Signed)
 Palliative Medicine [facility]  Telephone:(336) [phone] Fax:(336) [phone]   Name: Julia [name] Date: 07/01/2023 MRN: [id number]  DOB: 1965-12-07  Patient Care Team: Joaquim Nam, MD as PCP - General (Family Medicine) Jim Like, RN as Registered Nurse Jim Like, RN as Registered Nurse Pershing Proud, RN as Oncology Nurse Navigator Donnelly Angelica, RN as Oncology Nurse Navigator Dorothy Puffer, MD as Consulting Physician (Radiation Oncology) Serena Croissant, MD as Consulting Physician (Hematology and Oncology) Griselda Miner, MD as Consulting Physician (General Surgery) Anselm Lis, RPH-CPP as Pharmacist (Hematology and Oncology) [name], Arty Baumgartner, NP as Nurse Practitioner (Hospice and Palliative Medicine)    INTERVAL HISTORY: Julia [name] is a 58 y.o. 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 Palliative asked to see for symptom and pain management and goals of care.   SOCIAL HISTORY:     reports that she has been smoking cigarettes. She has a 62 pack-year smoking history. She has never used smokeless tobacco. She reports that she does not drink alcohol and does not use drugs.  ADVANCE DIRECTIVES:  None on file   CODE STATUS: Full code  PAST MEDICAL HISTORY: Past Medical History:  Diagnosis Date   Acute bronchitis 04/19/2021   Anxiety    over cancer diagnosis   Asthma    bronchial asthma    Cancer (HCC) 09/2019   left breast invasive mammary cancer   Chronic back pain    COPD (chronic obstructive pulmonary disease) (HCC)    smokes 1 1/2 ppd   Diabetes mellitus without complication (HCC)    GERD (gastroesophageal reflux disease)    Personal history of chemotherapy    Personal history of radiation therapy    Pneumonia    hx of 2018    Smoking      ALLERGIES:  is allergic to propoxyphene n-acetaminophen, nabumetone, and rofecoxib.  MEDICATIONS:  Current Outpatient Medications  Medication Sig Dispense Refill   abemaciclib (VERZENIO) 150 MG tablet Take 1 tablet (150 mg total) by mouth 2 (two) times daily. 60 tablet 6   calcium-vitamin D (OSCAL WITH D) 500-5 MG-MCG tablet Take 1 tablet by mouth daily. 30 tablet 5   famotidine (PEPCID) 20 MG tablet TAKE 1 TABLET BY MOUTH TWICE A DAY 180 tablet 1   gabapentin (NEURONTIN) 300 MG capsule Take 1 capsule (300 mg total) by mouth 2 (two) times daily. 180 capsule 3   hyaluronate sodium (RADIAPLEXRX) GEL Apply topically to affected area daily as directed. 170 g 3   ibuprofen (ADVIL) 600 MG tablet TAKE 1 TABLET (600 MG TOTAL) BY MOUTH EVERY 8 (EIGHT) HOURS AS NEEDED. PLEASE CONTACT [place] [facility] FOR AN APPOINTMENT WHEN POSSIBLE. CALL [phone] OR USE MYCHART 90 tablet 0   omeprazole (PRILOSEC) 20 MG capsule TAKE 1 CAPSULE BY MOUTH EVERY OTHER DAY 45 capsule 3   oxyCODONE (OXYCONTIN) 10 mg 12 hr tablet Take 1 tablet (10 mg total) by mouth every 12 (twelve) hours. 30 tablet 0   oxyCODONE-acetaminophen (PERCOCET/ROXICET) 5-325 MG tablet Take 1 tablet by mouth every 4 (four) hours as needed for severe pain (pain score 7-10). 90 tablet 0   SUMAtriptan (IMITREX) 50 MG tablet Take 1 tablet (50 mg total) by mouth every 2 (two) hours as needed for migraine (max 2 doses in 24 hours.). May repeat in 2 hours if headache  persists or recurs. 10 tablet 2   tiZANidine (ZANAFLEX) 2 MG tablet Take 1 tablet (2 mg total) by mouth every 8 (eight) hours as needed for muscle spasms. 30 tablet 0   VENTOLIN HFA 108 (90 Base) MCG/ACT inhaler TAKE 2 PUFFS BY MOUTH EVERY 6 HOURS AS NEEDED FOR WHEEZE OR SHORTNESS OF BREATH 18 each 1   No current facility-administered medications for this visit.    VITAL SIGNS: BP 135/62 (BP Location: Right Arm, Patient Position: Sitting)   Pulse 60   Temp 98.3 F (36.8 C)  (Temporal)   Resp 13   Wt 127 lb 14.4 oz (58 kg)   SpO2 100%   BMI 21.95 kg/m  Filed Weights   07/01/23 0934  Weight: 127 lb 14.4 oz (58 kg)    Estimated body mass index is 21.95 kg/m as calculated from the following:   Height as of 05/06/23: 5\' 4"  (1.626 m).   Weight as of this encounter: 127 lb 14.4 oz (58 kg).   PERFORMANCE STATUS (ECOG) : 1 - Symptomatic but completely ambulatory  Physical Exam General: NAD Cardiovascular: regular rate and rhythm Pulmonary: normal breathing pattern Extremities: no edema, no joint deformities Skin: no rashes Neurological: AAO x3  IMPRESSION: Discussed the use of AI scribe software for clinical note transcription with the patient, who gave verbal consent to proceed.  History of Present Illness Julia [name] is a 58 year old female who presents for medication management. No acute distress. She denies concerns of nausea, vomiting, constipation, or diarrhea.  She is trying to remain as active as possible.  She has experienced a decrease in appetite, stating that she feels full quickly when eating. Her weight has decreased from 129 pounds on March 5th to 127 pounds today. She has not consulted a dietitian and is attempting to increase her food intake. She confirms that her appetite is not satisfactory. We discussed continue to keep a close eye on her weight and appetite. If she continues to struggle with feelings of no appetite and early satiety will consider stimulant.   Julia [name] reports her pain is well managed on current regimen. She is currently on a pain regimen that includes Oxycontin 10 mg every twelve hours and Percocet one tablet every four hours as needed for breakthrough pain  Additionally, she takes Zanaflex 2 mg as needed and Gabapentin 300 mg twice a day. No adjustments to regimen at this time.   All questions answered and support provided.   I discussed the importance of continued conversation with family and their medical  providers regarding overall plan of care and treatment options, ensuring decisions are within the context of the patients values and GOCs.  Assessment and Plan Assessment & Plan Cancer Related Pain management Pain controlled with oxycontin and Percocet. Zanaflex used for muscle relaxation. No refills needed. - Continue Oxycontin 10 mg every 12 hours. - Continue Percocet 5/325mg  as needed. - Continue Zanaflex 2 mg as needed. -Gabapentin twice daily.   Appetite loss Decreased appetite and early satiety with slight weight loss. No dietitian consultation yet. Monitoring required. - Monitor weight and appetite closely. - Consider dietitian referral if no improvement. - Evaluate appetite and weight at next visit in about a month. - Consider appetite stimulant if weight decreases.  Heartburn Reports frequent heartburn. -Recommended consistent use of Prilosec  Follow-up Follow-up rescheduled to April 23. - Instruct to call if condition changes before next appointment.  Patient expressed understanding and was in agreement with this plan. She  also understands that She can call the clinic at any time with any questions, concerns, or complaints.   Any controlled substances utilized were prescribed in the context of palliative care. PDMP has been reviewed.   Visit consisted of counseling and education dealing with the complex and emotionally intense issues of symptom management and palliative care in the setting of serious and potentially life-threatening illness.  Willette Alma, AGPCNP-BC  Palliative Medicine Team/[facility]

## 2023-07-02 ENCOUNTER — Telehealth: Payer: Self-pay

## 2023-07-02 NOTE — Telephone Encounter (Signed)
 Pt needs an appointment for refills.  Last seen by 12/31/2021

## 2023-07-02 NOTE — Telephone Encounter (Signed)
 LVM for patient to c/b and schedule.

## 2023-07-03 NOTE — Telephone Encounter (Signed)
 LVM for pt to cb and schedule and sent mychart.

## 2023-07-03 NOTE — Telephone Encounter (Signed)
 Patient scheduled appt.

## 2023-07-06 ENCOUNTER — Other Ambulatory Visit: Payer: Self-pay | Admitting: Nurse Practitioner

## 2023-07-06 ENCOUNTER — Other Ambulatory Visit: Payer: Self-pay | Admitting: Family Medicine

## 2023-07-06 DIAGNOSIS — C50919 Malignant neoplasm of unspecified site of unspecified female breast: Secondary | ICD-10-CM

## 2023-07-06 DIAGNOSIS — G893 Neoplasm related pain (acute) (chronic): Secondary | ICD-10-CM

## 2023-07-06 DIAGNOSIS — C50412 Malignant neoplasm of upper-outer quadrant of left female breast: Secondary | ICD-10-CM

## 2023-07-06 DIAGNOSIS — Z515 Encounter for palliative care: Secondary | ICD-10-CM

## 2023-07-06 MED ORDER — OXYCODONE-ACETAMINOPHEN 5-325 MG PO TABS: 1.0000 | ORAL_TABLET | ORAL | 0 refills | Status: DC | PRN

## 2023-07-06 MED ORDER — OXYCODONE HCL ER 10 MG PO T12A
10.0000 mg | EXTENDED_RELEASE_TABLET | Freq: Two times a day (BID) | ORAL | 0 refills | Status: DC
Start: 2023-07-06 — End: 2023-07-27

## 2023-07-08 ENCOUNTER — Other Ambulatory Visit: Payer: Self-pay | Admitting: Nurse Practitioner

## 2023-07-08 DIAGNOSIS — Z515 Encounter for palliative care: Secondary | ICD-10-CM

## 2023-07-08 DIAGNOSIS — G893 Neoplasm related pain (acute) (chronic): Secondary | ICD-10-CM

## 2023-07-08 DIAGNOSIS — Z170 Estrogen receptor positive status [ER+]: Secondary | ICD-10-CM

## 2023-07-08 MED ORDER — TIZANIDINE HCL 2 MG PO TABS: 2.0000 mg | ORAL_TABLET | Freq: Three times a day (TID) | ORAL | 0 refills | Status: DC | PRN

## 2023-07-08 MED ORDER — IBUPROFEN 600 MG PO TABS
600.0000 mg | ORAL_TABLET | Freq: Three times a day (TID) | ORAL | 0 refills | Status: DC | PRN
Start: 2023-07-08 — End: 2023-08-04

## 2023-07-13 ENCOUNTER — Ambulatory Visit: Admitting: Family Medicine

## 2023-07-25 ENCOUNTER — Other Ambulatory Visit: Payer: Self-pay | Admitting: Family Medicine

## 2023-07-27 ENCOUNTER — Telehealth: Payer: Self-pay

## 2023-07-27 ENCOUNTER — Other Ambulatory Visit: Payer: Self-pay

## 2023-07-27 ENCOUNTER — Ambulatory Visit (HOSPITAL_COMMUNITY)
Admission: RE | Admit: 2023-07-27 | Discharge: 2023-07-27 | Disposition: A | Discharge status: Home / Self Care | Source: Ambulatory Visit | Attending: Physician Assistant | Admitting: Physician Assistant

## 2023-07-27 ENCOUNTER — Other Ambulatory Visit: Payer: Self-pay | Admitting: Nurse Practitioner

## 2023-07-27 ENCOUNTER — Inpatient Hospital Stay

## 2023-07-27 ENCOUNTER — Inpatient Hospital Stay: Attending: Hematology and Oncology

## 2023-07-27 ENCOUNTER — Inpatient Hospital Stay (HOSPITAL_BASED_OUTPATIENT_CLINIC_OR_DEPARTMENT_OTHER): Admitting: Physician Assistant

## 2023-07-27 VITALS — BP 110/55 | HR 66 | Temp 98.0°F | Resp 14 | Wt 121.3 lb

## 2023-07-27 VITALS — BP 120/65 | HR 64 | Temp 98.0°F | Resp 16 | Wt 121.3 lb

## 2023-07-27 DIAGNOSIS — G893 Neoplasm related pain (acute) (chronic): Secondary | ICD-10-CM | POA: Insufficient documentation

## 2023-07-27 DIAGNOSIS — Z5111 Encounter for antineoplastic chemotherapy: Secondary | ICD-10-CM | POA: Insufficient documentation

## 2023-07-27 DIAGNOSIS — Z1732 Human epidermal growth factor receptor 2 negative status: Secondary | ICD-10-CM | POA: Insufficient documentation

## 2023-07-27 DIAGNOSIS — C50412 Malignant neoplasm of upper-outer quadrant of left female breast: Secondary | ICD-10-CM

## 2023-07-27 DIAGNOSIS — Z9221 Personal history of antineoplastic chemotherapy: Secondary | ICD-10-CM | POA: Insufficient documentation

## 2023-07-27 DIAGNOSIS — F17210 Nicotine dependence, cigarettes, uncomplicated: Secondary | ICD-10-CM | POA: Insufficient documentation

## 2023-07-27 DIAGNOSIS — C50919 Malignant neoplasm of unspecified site of unspecified female breast: Secondary | ICD-10-CM

## 2023-07-27 DIAGNOSIS — C7951 Secondary malignant neoplasm of bone: Secondary | ICD-10-CM | POA: Insufficient documentation

## 2023-07-27 DIAGNOSIS — Z79899 Other long term (current) drug therapy: Secondary | ICD-10-CM | POA: Insufficient documentation

## 2023-07-27 DIAGNOSIS — Z170 Estrogen receptor positive status [ER+]: Secondary | ICD-10-CM | POA: Insufficient documentation

## 2023-07-27 DIAGNOSIS — R197 Diarrhea, unspecified: Secondary | ICD-10-CM | POA: Diagnosis not present

## 2023-07-27 DIAGNOSIS — R112 Nausea with vomiting, unspecified: Secondary | ICD-10-CM

## 2023-07-27 DIAGNOSIS — Z79818 Long term (current) use of other agents affecting estrogen receptors and estrogen levels: Secondary | ICD-10-CM | POA: Insufficient documentation

## 2023-07-27 DIAGNOSIS — R1084 Generalized abdominal pain: Secondary | ICD-10-CM

## 2023-07-27 DIAGNOSIS — Z1721 Progesterone receptor positive status: Secondary | ICD-10-CM | POA: Insufficient documentation

## 2023-07-27 DIAGNOSIS — Z9012 Acquired absence of left breast and nipple: Secondary | ICD-10-CM | POA: Insufficient documentation

## 2023-07-27 DIAGNOSIS — Z515 Encounter for palliative care: Secondary | ICD-10-CM

## 2023-07-27 LAB — CMP (CANCER CENTER ONLY)
ALT: 6 U/L (ref 0–44)
AST: 10 U/L — ABNORMAL LOW (ref 15–41)
Albumin: 4.3 g/dL (ref 3.5–5.0)
Alkaline Phosphatase: 42 U/L (ref 38–126)
Anion gap: 6 (ref 5–15)
BUN: 10 mg/dL (ref 6–20)
CO2: 26 mmol/L (ref 22–32)
Calcium: 8.9 mg/dL (ref 8.9–10.3)
Chloride: 107 mmol/L (ref 98–111)
Creatinine: 0.63 mg/dL (ref 0.44–1.00)
GFR, Estimated: >60 mL/min (ref >60)
Glucose, Bld: 104 mg/dL — ABNORMAL HIGH (ref 70–99)
Potassium: 3.8 mmol/L (ref 3.5–5.1)
Sodium: 139 mmol/L (ref 135–145)
Total Bilirubin: 0.6 mg/dL (ref 0.0–1.2)
Total Protein: 6.9 g/dL (ref 6.5–8.1)

## 2023-07-27 LAB — CBC WITH DIFFERENTIAL (CANCER CENTER ONLY)
Abs Immature Granulocytes: 0.01 10*3/uL (ref 0.00–0.07)
Basophils Absolute: 0 10*3/uL (ref 0.0–0.1)
Basophils Relative: 1 %
Eosinophils Absolute: 0.1 10*3/uL (ref 0.0–0.5)
Eosinophils Relative: 2 %
HCT: 37.6 % (ref 36.0–46.0)
Hemoglobin: 13.8 g/dL (ref 12.0–15.0)
Immature Granulocytes: 0 %
Lymphocytes Relative: 34 %
Lymphs Abs: 1.6 10*3/uL (ref 0.7–4.0)
MCH: 36.1 pg — ABNORMAL HIGH (ref 26.0–34.0)
MCHC: 36.7 g/dL — ABNORMAL HIGH (ref 30.0–36.0)
MCV: 98.4 fL (ref 80.0–100.0)
Monocytes Absolute: 0.3 10*3/uL (ref 0.1–1.0)
Monocytes Relative: 6 %
Neutro Abs: 2.6 10*3/uL (ref 1.7–7.7)
Neutrophils Relative %: 57 %
Platelet Count: 173 10*3/uL (ref 150–400)
RBC: 3.82 MIL/uL — ABNORMAL LOW (ref 3.87–5.11)
RDW: 13.1 % (ref 11.5–15.5)
WBC Count: 4.6 10*3/uL (ref 4.0–10.5)
nRBC: 0 % (ref 0.0–0.2)

## 2023-07-27 LAB — MAGNESIUM: Magnesium: 2 mg/dL (ref 1.7–2.4)

## 2023-07-27 MED ORDER — OXYCODONE HCL ER 10 MG PO T12A: 10.0000 mg | EXTENDED_RELEASE_TABLET | Freq: Two times a day (BID) | ORAL | 0 refills | Status: DC

## 2023-07-27 MED ORDER — SODIUM CHLORIDE (PF) 0.9 % IJ SOLN
INTRAMUSCULAR | Status: AC
  Filled 2023-07-27: qty 50

## 2023-07-27 MED ORDER — ONDANSETRON HCL 4 MG/2ML IJ SOLN
4.0000 mg | Freq: Once | INTRAMUSCULAR | Status: AC
  Administered 2023-07-27: 4 mg via INTRAVENOUS
  Filled 2023-07-27: qty 2

## 2023-07-27 MED ORDER — OXYCODONE-ACETAMINOPHEN 5-325 MG PO TABS: 1.0000 | ORAL_TABLET | ORAL | 0 refills | Status: DC | PRN

## 2023-07-27 MED ORDER — IOHEXOL 300 MG/ML  SOLN
100.0000 mL | Freq: Once | INTRAMUSCULAR | Status: AC | PRN
  Administered 2023-07-27: 100 mL via INTRAVENOUS

## 2023-07-27 MED ORDER — SODIUM CHLORIDE 0.9 % IV SOLN: Freq: Once | INTRAVENOUS | Status: AC

## 2023-07-27 MED ORDER — FAMOTIDINE IN NACL 20-0.9 MG/50ML-% IV SOLN
20.0000 mg | Freq: Once | INTRAVENOUS | Status: AC
  Administered 2023-07-27: 20 mg via INTRAVENOUS
  Filled 2023-07-27: qty 50

## 2023-07-27 MED ORDER — ONDANSETRON HCL 8 MG PO TABS
8.0000 mg | ORAL_TABLET | Freq: Three times a day (TID) | ORAL | 3 refills | Status: DC | PRN
Start: 2023-07-27 — End: 2023-12-17

## 2023-07-27 MED ORDER — MORPHINE SULFATE (PF) 2 MG/ML IV SOLN
2.0000 mg | Freq: Once | INTRAVENOUS | Status: AC
  Administered 2023-07-27: 2 mg via INTRAVENOUS
  Filled 2023-07-27: qty 1

## 2023-07-27 NOTE — Telephone Encounter (Signed)
 This kind patient has understandably had trouble arranging follow up in my clinic.  Can you manage this rx?  Many thanks for your help and consideration.

## 2023-07-27 NOTE — Progress Notes (Signed)
 Symptom Management Consult Note [facility]    Patient Care Team: Donnie Galea, MD as PCP - General (Family Medicine) Burnie Cartwright, RN as Registered Nurse Burnie Cartwright, RN as Registered Nurse Auther Bo, RN as Oncology Nurse Navigator Alane Hsu, RN as Oncology Nurse Navigator Johna Myers, MD as Consulting Physician (Radiation Oncology) Cameron Cea, MD as Consulting Physician (Hematology and Oncology) Caralyn Chandler, MD as Consulting Physician (General Surgery) Althea Atkinson, RPH-CPP as Pharmacist (Hematology and Oncology) [name], Giles Labrum, NP as Nurse Practitioner [facility] and Palliative Medicine)    Name / MRN / DOB: Julia [name]  [id number]  [birth year]   Date of visit: 07/27/2023   Chief Complaint/Reason for visit: nauea, vomiting, diarrhea   Current Therapy: Abemaciclib , Fulvestrant , Denosumab     ASSESSMENT AND PLAN Patient is a 58 y.o. female with oncologic history of metastatic breast cancer followed by Dr. Lee Public.  I have viewed most recent oncology note and lab work.  #Metastatic breast cancer  - Next appointment with oncologist is 10/21/23   #Nausea, vomiting, diarrhea --Acute onset. Diffuse abdominal tenderness on exam - Collect stool sample for infectious workup, - Continue Imodium for diarrhea management. - Will prescribe Lomotil for when Imodium is insufficient. - Administer 1L NS in clinic for hydration support. IV Pepcid  given as well. On reassessment no improvement of pain, IV morphine  and zofran  ordered. - CBC and MCP overall unremarkable. Order STAT CT scan of the abdomen to rule out colitis or other acute abdominal pathology. Patient unable to provide stool sample in clinic. Orders placed for GI PCR panel and c diff  testing. Patient given collection supplies and she will bring sample to upcoming palliative appointment. I will hold off on prescribing Lomotil until stool testing has  resulted. -Prescription sent by MD for zofran  earlier today.  Strict ED precautions discussed should symptoms worsen.   HEME/ONC HISTORY Oncology History  Malignant neoplasm of upper-outer quadrant of left breast in female, estrogen receptor positive (HCC)  08/25/2019 Initial Diagnosis   Palpable left breast mass x1 month. Diagnostic mammogram and US  on 08/18/19 showed a 2.8cm mass at the 2 o'clock position, and one lymph node with mild cortical thickening in the left axilla. 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   10/18/2019 Cancer Staging   Staging form: Breast, AJCC 8th Edition - Pathologic stage from 10/18/2019: Stage IB (pT2, pN0(sn), cM0, G3, ER+, PR+, HER2-)   10/21/2019 Oncotype testing   The Oncotype DX score was 32 predicting a risk of outside the breast recurrence over the next 9 years of 20% if the patient's only systemic therapy is tamoxifen for 5 years.    11/15/2019 - 03/27/2020 Chemotherapy   Adjuvant chemotherapy with dose dense Adriamycin  and Cytoxan  x4 (11/15/2019 - 12/27/2019) followed by Taxol  weekly x12 (01/09/2020 - 03/27/20).   04/25/2020 - 05/25/2020 Radiation Therapy   The patient initially received a dose of 42.56 Gy in 16 fractions to the breast using whole-breast tangent fields. This was delivered using a 3-D conformal technique. The pt received a boost delivering an additional 8 Gy in 4 fractions using a electron boost with electrons. The total dose was 50.56 Gy.  05/2020 -  Anti-estrogen oral therapy   Anastrozole    05/06/2021 Surgery   Left mastectomy: Benign (performed because of left breast fibrosis and pain)       INTERVAL HISTORY  Discussed the use of AI  scribe software for clinical note transcription with the patient, who gave verbal consent to proceed.    Julia [name] is a 58 y.o. female with oncologic history as above presenting to [facility] today with chief complaint of nausea, vomiting, and diarrhea. Accompanied to clinic today by spouse who provides additional history.  Nausea and vomiting began x 6 days ago. Diarrhea has been ongoing x 1 week. She vomited twice yesterday and experiences diarrhea approximately three times a day. Symptoms occur about an hour and a half after taking Verzenio , which had not previously caused vomiting.  She manages diarrhea with Imodium, taking two tablets after the first bowel movement and one more after the second, but not every day. She has not used Lomotil before. Her fluid intake has been low, with only one bottle of water and some coffee consumed yesterday.  Nausea occurs about an hour after taking Verzenio  and subsides after vomiting. No specific triggers such as smells. She describes her stomach pain as cramping, with a severity of 8 out of 10, which started with the onset of diarrhea last week. The pain is diffuse across her abdomen. No normal bowel movements since the onset of diarrhea, which is described as liquid and brown in color. No recent antibiotic use, fever, muscle aches,body aches, or urinary symptoms. She has not experienced diarrhea throughout her entire course of Verzenio  treatment until now. She typically consumes takeout food and does not cook at home.    ROS  All other systems are reviewed and are negative for acute change except as noted in the HPI.    Allergies  Allergen Reactions   Propoxyphene N-Acetaminophen  Hives   Nabumetone Rash    She can tolerate ibuprofen  w/o difficulty   Rofecoxib Rash    Nightmares (vioxx)     Past Medical History:  Diagnosis Date   Acute bronchitis 04/19/2021   Anxiety    over cancer diagnosis   Asthma    bronchial asthma    Cancer (HCC)  09/2019   left breast invasive mammary cancer   Chronic back pain    COPD (chronic obstructive pulmonary disease) (HCC)    smokes 1 1/2 ppd   Diabetes mellitus without complication (HCC)    GERD (gastroesophageal reflux disease)    Personal history of chemotherapy    Personal history of radiation therapy    Pneumonia    hx of 2018    Smoking      Past Surgical History:  Procedure Laterality Date   BREAST BIOPSY Left 09/2019   x2   BREAST BIOPSY Left 11/22/2020   BREAST LUMPECTOMY Left 10/2019   BREAST LUMPECTOMY WITH SENTINEL LYMPH NODE BIOPSY Left 10/07/2019   Procedure: LEFT BREAST LUMPECTOMY WITH SENTINEL LYMPH NODE BX;  Surgeon: Caralyn Chandler, MD;  Location: [facility];  Service: General;  Laterality: Left;  PEC BLOCK   CHOLECYSTECTOMY     PORTACATH PLACEMENT Right 11/10/2019   Procedure: INSERTION PORT-A-CATH;  Surgeon: Caralyn Chandler, MD;  Location: [facility];  Service: General;  Laterality: Right;   TONSILLECTOMY     TOTAL MASTECTOMY Left 05/06/2021   Procedure: LEFT TOTAL MASTECTOMY;  Surgeon: Caralyn Chandler, MD;  [facility];  Service: General;  Laterality: Left;   VULVECTOMY  N/A 01/30/2015   Procedure: WIDE LOCAL EXCISION VULVA;  Surgeon: Alphonso Aschoff, MD;  Location: [facility];  Service: Gynecology;  Laterality: N/A;    Social History   Socioeconomic History   Marital status: Married    Spouse name: Not on file   Number of children: 1   Years of education: 12   Highest education level: 11th grade  Occupational History   Occupation: Conservation officer, nature at [employer]  Tobacco Use   Smoking status: Every Day    Current packs/day: 2.00    Average packs/day: 2.0 packs/day for 31.0 years (62.0 ttl pk-yrs)    Types: Cigarettes   Smokeless tobacco: Never  Vaping Use   Vaping status: Never Used  Substance and Sexual Activity   Alcohol use: No    Alcohol/week: 0.0 standard drinks of alcohol   Drug use: No   Sexual activity: Yes    Birth  control/protection: Post-menopausal  Other Topics Concern   Not on file  Social History Narrative   One son   Married 2000 (had been together since 1984)   Social Drivers of Corporate investment banker Strain: Patient Declined (05/31/2023)   Overall Financial Resource Strain (CARDIA)    Difficulty of Paying Living Expenses: Patient declined  Food Insecurity: Patient Declined (05/31/2023)   Hunger Vital Sign    Worried About Running Out of Food in the Last Year: Patient declined    Ran Out of Food in the Last Year: Patient declined  Transportation Needs: Patient Declined (05/31/2023)   PRAPARE - Transportation    Lack of Transportation (Medical): Patient declined    Lack of Transportation (Non-Medical): Patient declined  Physical Activity: Unknown (05/31/2023)   Exercise Vital Sign    Days of Exercise per Week: 7 days    Minutes of Exercise per Session: Patient declined  Stress: Stress Concern Present (05/31/2023)   [employer] of Occupational Health - Occupational Stress Questionnaire    Feeling of Stress : Very much  Social Connections: Unknown (05/31/2023)   Social Connection and Isolation Panel [NHANES]    Frequency of Communication with Friends and Family: Three times a week    Frequency of Social Gatherings with Friends and Family: Once a week    Attends Religious Services: Patient declined    Database administrator or Organizations: No    Attends Engineer, structural: Not on file    Marital Status: Married  Catering manager Violence: Not on file    Family History  Problem Relation Age of Onset   Hypertension Mother    Diabetes Mother        insulin  dependent   Heart disease Mother    Diabetes Father    Heart disease Father        CAD   Hypertension Brother    Kidney disease Brother        kidney failure/resolved   Colon cancer Maternal Aunt        dx'd at ~62   Breast cancer Maternal Aunt 28     Current Outpatient Medications:    abemaciclib   (VERZENIO ) 150 MG tablet, Take 1 tablet (150 mg total) by mouth 2 (two) times daily., Disp: 60 tablet, Rfl: 6   calcium -vitamin D (OSCAL WITH D) 500-5 MG-MCG tablet, Take 1 tablet by mouth daily., Disp: 30 tablet, Rfl: 5   famotidine  (PEPCID ) 20 MG tablet, TAKE 1 TABLET BY MOUTH TWICE A DAY, Disp: 180 tablet, Rfl: 1   gabapentin  (NEURONTIN ) 300 MG capsule, Take 1  capsule (300 mg total) by mouth 2 (two) times daily., Disp: 180 capsule, Rfl: 3   hyaluronate sodium (RADIAPLEXRX) GEL, Apply topically to affected area daily as directed., Disp: 170 g, Rfl: 3   ibuprofen  (ADVIL ) 600 MG tablet, Take 1 tablet (600 mg total) by mouth every 8 (eight) hours as needed., Disp: 90 tablet, Rfl: 0   omeprazole  (PRILOSEC ) 20 MG capsule, TAKE 1 CAPSULE BY MOUTH EVERY OTHER DAY, Disp: 45 capsule, Rfl: 3   ondansetron  (ZOFRAN ) 8 MG tablet, Take 1 tablet (8 mg total) by mouth every 8 (eight) hours as needed for nausea or vomiting., Disp: 20 tablet, Rfl: 3   [START ON 08/06/2023] oxyCODONE  (OXYCONTIN ) 10 mg 12 hr tablet, Take 1 tablet (10 mg total) by mouth every 12 (twelve) hours., Disp: 60 tablet, Rfl: 0   oxyCODONE -acetaminophen  (PERCOCET/ROXICET) 5-325 MG tablet, Take 1 tablet by mouth every 4 (four) hours as needed for severe pain (pain score 7-10)., Disp: 90 tablet, Rfl: 0   SUMAtriptan  (IMITREX ) 50 MG tablet, Take 1 tablet (50 mg total) by mouth every 2 (two) hours as needed for migraine (max 2 doses in 24 hours.). May repeat in 2 hours if headache persists or recurs., Disp: 10 tablet, Rfl: 2   tiZANidine  (ZANAFLEX ) 2 MG tablet, Take 1 tablet (2 mg total) by mouth every 8 (eight) hours as needed for muscle spasms., Disp: 30 tablet, Rfl: 0   VENTOLIN  HFA 108 (90 Base) MCG/ACT inhaler, TAKE 2 PUFFS BY MOUTH EVERY 6 HOURS AS NEEDED FOR WHEEZE OR SHORTNESS OF BREATH, Disp: 18 each, Rfl: 1 No current facility-administered medications for this visit.  Facility-Administered Medications Ordered in Other Visits:    0.9 %   sodium chloride  infusion, , Intravenous, Once, Walisiewicz, Isam Unrein E, PA-C, Last Rate: 500 mL/hr at 07/27/23 1253, New Bag at 07/27/23 1253   famotidine  (PEPCID ) IVPB 20 mg premix, 20 mg, Intravenous, Once, Walisiewicz, Dalilah Curlin E, PA-C  PHYSICAL EXAM ECOG FS:1 - Symptomatic but completely ambulatory    Vitals:   07/27/23 1201  BP: (!) 110/55  Pulse: 66  Resp: 14  Temp: 98 F (36.7 C)  TempSrc: Temporal  SpO2: 98%  Weight: 121 lb 4.8 oz (55 kg)   Physical Exam Vitals and nursing note reviewed.  Constitutional:      Appearance: She is not ill-appearing or toxic-appearing.  HENT:     Head: Normocephalic.     Mouth/Throat:     Mouth: Mucous membranes are dry.  Eyes:     Conjunctiva/sclera: Conjunctivae normal.  Cardiovascular:     Rate and Rhythm: Normal rate and regular rhythm.     Pulses: Normal pulses.     Heart sounds: Normal heart sounds.  Pulmonary:     Effort: Pulmonary effort is normal.     Breath sounds: Normal breath sounds.  Abdominal:     General: There is no distension.     Tenderness: There is abdominal tenderness (generalized with voluntary guarding). There is no right CVA tenderness, left CVA tenderness or rebound.  Musculoskeletal:     Cervical back: Normal range of motion.  Skin:    General: Skin is warm and dry.  Neurological:     Mental Status: She is alert.  Psychiatric:        Mood and Affect: Affect is tearful.       LABORATORY DATA I have reviewed the data as listed    Latest Ref Rng & Units 07/27/2023   11:48 AM 05/06/2023    7:55 AM  04/09/2023    8:05 AM  CBC  WBC 4.0 - 10.5 K/uL 4.6  4.8  5.1   Hemoglobin 12.0 - 15.0 g/dL 16.1  09.6  04.5   Hematocrit 36.0 - 46.0 % 37.6  38.0  40.3   Platelets 150 - 400 K/uL 173  206  222         Latest Ref Rng & Units 07/27/2023   11:48 AM 05/06/2023    7:55 AM 04/09/2023    8:05 AM  CMP  Glucose 70 - 99 mg/dL 409  811  914   BUN 6 - 20 mg/dL 10  12  9    Creatinine 0.44 - 1.00 mg/dL 7.82  9.56   2.13   Sodium 135 - 145 mmol/L 139  138  139   Potassium 3.5 - 5.1 mmol/L 3.8  4.6  4.4   Chloride 98 - 111 mmol/L 107  109  109   CO2 22 - 32 mmol/L 26  24  25    Calcium  8.9 - 10.3 mg/dL 8.9  9.0  8.6   Total Protein 6.5 - 8.1 g/dL 6.9  6.9  6.9   Total Bilirubin 0.0 - 1.2 mg/dL 0.6  0.5  0.3   Alkaline Phos 38 - 126 U/L 42  74  113   AST 15 - 41 U/L 10  10  9    ALT 0 - 44 U/L 6  6  5         RADIOGRAPHIC STUDIES (from last 24 hours if applicable) I have personally reviewed the radiological images as listed and agreed with the findings in the report. No results found.      Visit Diagnosis: 1. Generalized abdominal pain   2. Malignant neoplasm of upper-outer quadrant of left breast in female, estrogen receptor positive (HCC)   3. Nausea and vomiting, unspecified vomiting type   4. Diarrhea, unspecified type      Orders Placed This Encounter  Procedures   CT ABDOMEN PELVIS W CONTRAST    Standing Status:   Future    Expected Date:   07/27/2023    Expiration Date:   07/26/2024    If indicated for the ordered procedure, I authorize the administration of contrast media per Radiology protocol:   Yes    Does the patient have a contrast media/X-ray dye allergy?:   No    Is patient pregnant?:   No    Preferred imaging location?:   [facility]    If indicated for the ordered procedure, I authorize the administration of oral contrast media per Radiology protocol:   Yes    All questions were answered. The patient knows to call the clinic with any problems, questions or concerns. No barriers to learning was detected.  A total of more than 30 minutes were spent on this encounter with face-to-face time and non-face-to-face time, including preparing to see the patient, ordering tests and/or medications, counseling the patient and coordination of care as outlined above.    Thank you for allowing me to participate in the care of this patient.    Clarence Cogswell E  Walisiewicz,  PA-C Department of Hematology/Oncology [facility] Phone: [phone]  [phone]    07/27/2023 1:07 PM

## 2023-07-27 NOTE — Telephone Encounter (Signed)
 Pt reports for the past 3-4 days she has been experiencing N/V/D. She states she vomits about an hour after taking her Verzenio , which is new. States she has had about 3-4 watery stools daily for 3-4 days as well and has taken imodium, but if she misses a dose she will have more watery stool. States she is afraid to eat for fear of vomiting, but she is drinking water and coffee with creamer; advised pt to avoid dairy while she has GI upset. Pt was offered [facility] appt at [facility] for lab eval and possible antiemetics/IVF. She accepted and will come in today at 1130 for labs followed by visit with [facility].   Order placed for home antiemetics per MD. Zofran  8 mg 1 tab PO q8h for nausea 30 tabs 3 refill.

## 2023-07-27 NOTE — Progress Notes (Signed)
 Per Rosalind Columbia, PA-C - okay to leave 22G PIV in place for patient's scan scheduled for 5 PM today at [facility].  PIV saline locked and wrapped. Patient aware that they can not leave hospital property with PIV in place.  Patient and husband confirmed the information and will remain in the [facility] lobby until they are ready to walk over to [facility] for scan.

## 2023-07-28 ENCOUNTER — Encounter: Payer: Self-pay | Admitting: Hematology and Oncology

## 2023-07-28 ENCOUNTER — Encounter: Payer: Self-pay | Admitting: Physician Assistant

## 2023-07-28 LAB — CANCER ANTIGEN 27.29: CA 27.29: 73.9 U/mL — ABNORMAL HIGH (ref 0.0–38.6)

## 2023-07-28 MED ORDER — TIZANIDINE HCL 2 MG PO TABS: 2.0000 mg | ORAL_TABLET | Freq: Three times a day (TID) | ORAL | 0 refills | Status: DC | PRN

## 2023-07-29 ENCOUNTER — Inpatient Hospital Stay: Payer: Medicaid Other

## 2023-07-29 ENCOUNTER — Inpatient Hospital Stay (HOSPITAL_BASED_OUTPATIENT_CLINIC_OR_DEPARTMENT_OTHER): Admitting: Nurse Practitioner

## 2023-07-29 ENCOUNTER — Encounter: Payer: Self-pay | Admitting: Nurse Practitioner

## 2023-07-29 VITALS — BP 126/60 | HR 60 | Temp 98.3°F | Resp 16

## 2023-07-29 DIAGNOSIS — C7951 Secondary malignant neoplasm of bone: Secondary | ICD-10-CM | POA: Diagnosis not present

## 2023-07-29 DIAGNOSIS — R630 Anorexia: Secondary | ICD-10-CM | POA: Diagnosis not present

## 2023-07-29 DIAGNOSIS — R530 Neoplastic (malignant) related fatigue: Secondary | ICD-10-CM | POA: Diagnosis not present

## 2023-07-29 DIAGNOSIS — Z170 Estrogen receptor positive status [ER+]: Secondary | ICD-10-CM | POA: Diagnosis not present

## 2023-07-29 DIAGNOSIS — R634 Abnormal weight loss: Secondary | ICD-10-CM

## 2023-07-29 DIAGNOSIS — C50919 Malignant neoplasm of unspecified site of unspecified female breast: Secondary | ICD-10-CM

## 2023-07-29 DIAGNOSIS — Z1721 Progesterone receptor positive status: Secondary | ICD-10-CM | POA: Diagnosis not present

## 2023-07-29 DIAGNOSIS — C50412 Malignant neoplasm of upper-outer quadrant of left female breast: Secondary | ICD-10-CM | POA: Diagnosis not present

## 2023-07-29 DIAGNOSIS — Z79899 Other long term (current) drug therapy: Secondary | ICD-10-CM | POA: Diagnosis not present

## 2023-07-29 DIAGNOSIS — R110 Nausea: Secondary | ICD-10-CM

## 2023-07-29 DIAGNOSIS — Z9012 Acquired absence of left breast and nipple: Secondary | ICD-10-CM | POA: Diagnosis not present

## 2023-07-29 DIAGNOSIS — F17210 Nicotine dependence, cigarettes, uncomplicated: Secondary | ICD-10-CM | POA: Diagnosis not present

## 2023-07-29 DIAGNOSIS — Z515 Encounter for palliative care: Secondary | ICD-10-CM | POA: Diagnosis not present

## 2023-07-29 DIAGNOSIS — G893 Neoplasm related pain (acute) (chronic): Secondary | ICD-10-CM | POA: Diagnosis not present

## 2023-07-29 DIAGNOSIS — Z9221 Personal history of antineoplastic chemotherapy: Secondary | ICD-10-CM | POA: Diagnosis not present

## 2023-07-29 DIAGNOSIS — Z5111 Encounter for antineoplastic chemotherapy: Secondary | ICD-10-CM | POA: Diagnosis not present

## 2023-07-29 DIAGNOSIS — Z79818 Long term (current) use of other agents affecting estrogen receptors and estrogen levels: Secondary | ICD-10-CM | POA: Diagnosis not present

## 2023-07-29 DIAGNOSIS — Z1732 Human epidermal growth factor receptor 2 negative status: Secondary | ICD-10-CM | POA: Diagnosis not present

## 2023-07-29 LAB — MAGNESIUM: Magnesium: 2 mg/dL (ref 1.7–2.4)

## 2023-07-29 LAB — CMP (CANCER CENTER ONLY)
ALT: <5 U/L (ref 0–44)
AST: 9 U/L — ABNORMAL LOW (ref 15–41)
Albumin: 4 g/dL (ref 3.5–5.0)
Alkaline Phosphatase: 41 U/L (ref 38–126)
Anion gap: 3 — ABNORMAL LOW (ref 5–15)
BUN: 9 mg/dL (ref 6–20)
CO2: 27 mmol/L (ref 22–32)
Calcium: 8.3 mg/dL — ABNORMAL LOW (ref 8.9–10.3)
Chloride: 109 mmol/L (ref 98–111)
Creatinine: 0.63 mg/dL (ref 0.44–1.00)
GFR, Estimated: >60 mL/min (ref >60)
Glucose, Bld: 97 mg/dL (ref 70–99)
Potassium: 3.9 mmol/L (ref 3.5–5.1)
Sodium: 139 mmol/L (ref 135–145)
Total Bilirubin: 0.3 mg/dL (ref 0.0–1.2)
Total Protein: 6.3 g/dL — ABNORMAL LOW (ref 6.5–8.1)

## 2023-07-29 LAB — CBC WITH DIFFERENTIAL (CANCER CENTER ONLY)
Abs Immature Granulocytes: 0.01 10*3/uL (ref 0.00–0.07)
Basophils Absolute: 0.1 10*3/uL (ref 0.0–0.1)
Basophils Relative: 1 %
Eosinophils Absolute: 0.1 10*3/uL (ref 0.0–0.5)
Eosinophils Relative: 2 %
HCT: 35.4 % — ABNORMAL LOW (ref 36.0–46.0)
Hemoglobin: 12.7 g/dL (ref 12.0–15.0)
Immature Granulocytes: 0 %
Lymphocytes Relative: 34 %
Lymphs Abs: 1.7 10*3/uL (ref 0.7–4.0)
MCH: 36.1 pg — ABNORMAL HIGH (ref 26.0–34.0)
MCHC: 35.9 g/dL (ref 30.0–36.0)
MCV: 100.6 fL — ABNORMAL HIGH (ref 80.0–100.0)
Monocytes Absolute: 0.3 10*3/uL (ref 0.1–1.0)
Monocytes Relative: 6 %
Neutro Abs: 2.9 10*3/uL (ref 1.7–7.7)
Neutrophils Relative %: 57 %
Platelet Count: 154 10*3/uL (ref 150–400)
RBC: 3.52 MIL/uL — ABNORMAL LOW (ref 3.87–5.11)
RDW: 13.1 % (ref 11.5–15.5)
WBC Count: 5.1 10*3/uL (ref 4.0–10.5)
nRBC: 0 % (ref 0.0–0.2)

## 2023-07-29 MED ORDER — DENOSUMAB 120 MG/1.7ML ~~LOC~~ SOLN
120.0000 mg | Freq: Once | SUBCUTANEOUS | Status: AC
  Administered 2023-07-29: 120 mg via SUBCUTANEOUS
  Filled 2023-07-29: qty 1.7

## 2023-07-29 MED ORDER — FULVESTRANT 250 MG/5ML IM SOSY
500.0000 mg | PREFILLED_SYRINGE | Freq: Once | INTRAMUSCULAR | Status: AC
  Administered 2023-07-29: 500 mg via INTRAMUSCULAR
  Filled 2023-07-29: qty 10

## 2023-07-29 NOTE — Progress Notes (Signed)
 Palliative Medicine [facility]  Telephone:([phone] Fax:(336) [phone]   Name: Julia [name] Date: 07/29/2023 MRN: [id number]  DOB: 1965-11-11  Patient Care Team: Donnie Galea, MD as PCP - General (Family Medicine) Burnie Cartwright, RN as Registered Nurse Burnie Cartwright, RN as Registered Nurse Auther Bo, RN as Oncology Nurse Navigator Alane Hsu, RN as Oncology Nurse Navigator Johna Myers, MD as Consulting Physician (Radiation Oncology) Cameron Cea, MD as Consulting Physician (Hematology and Oncology) Caralyn Chandler, MD as Consulting Physician (General Surgery) Althea Atkinson, RPH-CPP as Pharmacist (Hematology and Oncology) [name], Giles Labrum, NP as Nurse Practitioner (Hospice and Palliative Medicine)    INTERVAL HISTORY: Julia [name] is a 58 y.o. 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 Palliative asked to see for symptom and pain management and goals of care.   SOCIAL HISTORY:     reports that she has been smoking cigarettes. She has a 62 pack-year smoking history. She has never used smokeless tobacco. She reports that she does not drink alcohol and does not use drugs.  ADVANCE DIRECTIVES:  None on file   CODE STATUS: Full code  PAST MEDICAL HISTORY: Past Medical History:  Diagnosis Date   Acute bronchitis 04/19/2021   Anxiety    over cancer diagnosis   Asthma    bronchial asthma    Cancer (HCC) 09/2019   left breast invasive mammary cancer   Chronic back pain    COPD (chronic obstructive pulmonary disease) (HCC)    smokes 1 1/2 ppd   Diabetes mellitus without complication (HCC)    GERD (gastroesophageal reflux disease)    Personal history of chemotherapy    Personal history of radiation therapy    Pneumonia    hx of 2018    Smoking      ALLERGIES:  is allergic to propoxyphene n-acetaminophen , nabumetone, and rofecoxib.  MEDICATIONS:  Current Outpatient Medications  Medication Sig Dispense Refill   abemaciclib  (VERZENIO ) 150 MG tablet Take 1 tablet (150 mg total) by mouth 2 (two) times daily. 60 tablet 6   calcium -vitamin D (OSCAL WITH D) 500-5 MG-MCG tablet Take 1 tablet by mouth daily. 30 tablet 5   famotidine  (PEPCID ) 20 MG tablet TAKE 1 TABLET BY MOUTH TWICE A DAY 180 tablet 1   gabapentin  (NEURONTIN ) 300 MG capsule Take 1 capsule (300 mg total) by mouth 2 (two) times daily. 180 capsule 3   hyaluronate sodium (RADIAPLEXRX) GEL Apply topically to affected area daily as directed. 170 g 3   ibuprofen  (ADVIL ) 600 MG tablet Take 1 tablet (600 mg total) by mouth every 8 (eight) hours as needed. 90 tablet 0   omeprazole  (PRILOSEC ) 20 MG capsule TAKE 1 CAPSULE BY MOUTH EVERY OTHER DAY 45 capsule 3   ondansetron  (ZOFRAN ) 8 MG tablet Take 1 tablet (8 mg total) by mouth every 8 (eight) hours as needed for nausea or vomiting. 20 tablet 3   [START ON 08/06/2023] oxyCODONE  (OXYCONTIN ) 10 mg 12 hr tablet Take 1 tablet (10 mg total) by mouth every 12 (twelve) hours. 60 tablet 0   oxyCODONE -acetaminophen  (PERCOCET/ROXICET) 5-325 MG tablet Take 1 tablet by mouth every 4 (four) hours as needed for severe pain (pain score 7-10). 90 tablet 0   SUMAtriptan  (IMITREX ) 50 MG tablet Take 1 tablet (50 mg total) by mouth every 2 (two) hours as needed for  migraine (max 2 doses in 24 hours.). May repeat in 2 hours if headache persists or recurs. 10 tablet 2   tiZANidine  (ZANAFLEX ) 2 MG tablet Take 1 tablet (2 mg total) by mouth every 8 (eight) hours as needed for muscle spasms. 30 tablet 0   VENTOLIN  HFA 108 (90 Base) MCG/ACT inhaler TAKE 2 PUFFS BY MOUTH EVERY 6 HOURS AS NEEDED FOR WHEEZE OR SHORTNESS OF BREATH 18 each 1   No current facility-administered medications for this visit.    VITAL SIGNS: There were no vitals taken for this visit. There  were no vitals filed for this visit.   Estimated body mass index is 20.82 kg/m as calculated from the following:   Height as of 05/06/23: 5\' 4"  (1.626 m).   Weight as of 07/27/23: 121 lb 4.8 oz (55 kg).   PERFORMANCE STATUS (ECOG) : 1 - Symptomatic but completely ambulatory  Physical Exam General: NAD Cardiovascular: regular rate and rhythm Pulmonary: normal breathing pattern Abdomen:   tender to palpate, +BS Extremities: no edema, no joint deformities Skin: no rashes Neurological: AAO x3  IMPRESSION: Discussed the use of AI scribe software for clinical note transcription with the patient, who gave verbal consent to proceed.  History of Present Illness Julia [name] is a 58 year old female who presents with ongoing gastrointestinal issues including decreased appetite and nausea. Patient recently seen by Polly Brink, PA. CT showed no acute abnormalities.   Julia [name] is on Verzenio , which she takes twice a day. She reports a decreased energy level and states that eating causes her abdominal discomfort, which affects her appetite. Pending stool specimen. The abdominal pain is described as similar to menstrual cramps, diffuse, and not localized to any specific area.  Is tender throughout. Despite these issues, she continues to work.  Her current weight is down 221 pounds from 127 pounds on March 26.  Appetite is slowly improving however remains a challenge due to her discomfort.  We discussed focusing on small frequent meals.  Nausea is well-controlled with antiemetics.  Patient is aware if discomfort continues or symptoms worsen to contact medical team.  She is currently taking Oxycontin  every twelve hours for pain management and has recently refilled her prescription for tizanidine , a muscle relaxer, which she takes as needed. Additionally, she is on gabapentin , taking one 300 mg capsule twice a day. Pain is well controlled outside of her abdominal discomfort.   All questions answered and support  provided.   I discussed the importance of continued conversation with family and their medical providers regarding overall plan of care and treatment options, ensuring decisions are within the context of the patients values and GOCs.  Assessment and Plan Assessment & Plan Cancer Related Pain management Pain controlled with oxycontin  and Percocet. Zanaflex  used for muscle relaxation. No refills needed. - Continue Oxycontin  10 mg every 12 hours. - Continue Percocet 5/325mg  as needed. - Continue Zanaflex  2 mg as needed. -Gabapentin  twice daily.   Appetite loss/Abdominal discomfort Decreased appetite and early satiety with slight weight loss. No dietitian consultation yet. Monitoring required. Weight down to 121lbs from 127lbs.  - Monitor weight and appetite closely. - Consider dietitian referral if no improvement. - Evaluate appetite and weight at next visit in about a month. - Consider appetite stimulant if weight decreases. -Ct showed no abnormalities. Patient to report worsening symptoms.  Awaiting stool results. - Submit stool specimen for analysis.  Heartburn Reports frequent heartburn. -Recommended consistent use of Prilosec   Nausea Managed with antiemetics.  No vomiting reported. -  Continue anti-nausea medication as prescribed.  Follow-up I will plan to see patient back in the clinic in 3-4 weeks.  Will contact patient by phone in 1 week for close symptom management follow-up. - Instruct to call if condition changes before next appointment.  Patient expressed understanding and was in agreement with this plan. She also understands that She can call the clinic at any time with any questions, concerns, or complaints.   Any controlled substances utilized were prescribed in the context of palliative care. PDMP has been reviewed.   Visit consisted of counseling and education dealing with the complex and emotionally intense issues of symptom management and palliative care in the  setting of serious and potentially life-threatening illness.  Dellia Ferguson, AGPCNP-BC  Palliative Medicine Team/[facility]

## 2023-07-29 NOTE — Progress Notes (Signed)
 Ok to proceed with Xgeva  today with Ca 8.3 per Dr. Gudena.  Ashonti Leandro, PharmD, MBA

## 2023-07-30 ENCOUNTER — Other Ambulatory Visit: Payer: Medicaid Other

## 2023-07-30 ENCOUNTER — Ambulatory Visit: Payer: Medicaid Other

## 2023-07-30 ENCOUNTER — Other Ambulatory Visit: Payer: Self-pay

## 2023-07-30 ENCOUNTER — Telehealth: Payer: Self-pay | Admitting: Physician Assistant

## 2023-07-30 ENCOUNTER — Encounter

## 2023-07-30 DIAGNOSIS — Z5111 Encounter for antineoplastic chemotherapy: Secondary | ICD-10-CM | POA: Diagnosis not present

## 2023-07-30 DIAGNOSIS — C50412 Malignant neoplasm of upper-outer quadrant of left female breast: Secondary | ICD-10-CM | POA: Diagnosis not present

## 2023-07-30 DIAGNOSIS — G893 Neoplasm related pain (acute) (chronic): Secondary | ICD-10-CM | POA: Diagnosis not present

## 2023-07-30 DIAGNOSIS — Z79818 Long term (current) use of other agents affecting estrogen receptors and estrogen levels: Secondary | ICD-10-CM | POA: Diagnosis not present

## 2023-07-30 DIAGNOSIS — Z9221 Personal history of antineoplastic chemotherapy: Secondary | ICD-10-CM | POA: Diagnosis not present

## 2023-07-30 DIAGNOSIS — C7951 Secondary malignant neoplasm of bone: Secondary | ICD-10-CM | POA: Diagnosis not present

## 2023-07-30 DIAGNOSIS — Z79899 Other long term (current) drug therapy: Secondary | ICD-10-CM | POA: Diagnosis not present

## 2023-07-30 DIAGNOSIS — Z9012 Acquired absence of left breast and nipple: Secondary | ICD-10-CM | POA: Diagnosis not present

## 2023-07-30 DIAGNOSIS — R197 Diarrhea, unspecified: Secondary | ICD-10-CM

## 2023-07-30 DIAGNOSIS — Z1721 Progesterone receptor positive status: Secondary | ICD-10-CM | POA: Diagnosis not present

## 2023-07-30 DIAGNOSIS — Z170 Estrogen receptor positive status [ER+]: Secondary | ICD-10-CM | POA: Diagnosis not present

## 2023-07-30 DIAGNOSIS — F17210 Nicotine dependence, cigarettes, uncomplicated: Secondary | ICD-10-CM | POA: Diagnosis not present

## 2023-07-30 DIAGNOSIS — Z1732 Human epidermal growth factor receptor 2 negative status: Secondary | ICD-10-CM | POA: Diagnosis not present

## 2023-07-30 LAB — GASTROINTESTINAL PANEL BY PCR, STOOL (REPLACES STOOL CULTURE)

## 2023-07-30 LAB — C DIFFICILE QUICK SCREEN W PCR REFLEX
C Diff antigen: NEGATIVE
C Diff interpretation: NOT DETECTED
C Diff toxin: NEGATIVE

## 2023-07-30 LAB — CANCER ANTIGEN 27.29: CA 27.29: 68.7 U/mL — ABNORMAL HIGH (ref 0.0–38.6)

## 2023-07-30 NOTE — Telephone Encounter (Signed)
 Contacted patient to discuss MD recommendations. We will have her hold the Verzenio  x 2 weeks to see if GI symptoms resolve. Patient is agreeable to phone visit on 08/17/23 for follow up. She knows to RTC sooner if needed. Strict ED precautions discussed should symptoms worsen.

## 2023-08-03 ENCOUNTER — Other Ambulatory Visit: Payer: Self-pay | Admitting: Family Medicine

## 2023-08-07 ENCOUNTER — Inpatient Hospital Stay: Attending: Hematology and Oncology | Admitting: Nurse Practitioner

## 2023-08-07 ENCOUNTER — Telehealth: Payer: Self-pay | Admitting: Hematology and Oncology

## 2023-08-07 ENCOUNTER — Encounter: Payer: Self-pay | Admitting: Nurse Practitioner

## 2023-08-07 DIAGNOSIS — Z515 Encounter for palliative care: Secondary | ICD-10-CM | POA: Diagnosis not present

## 2023-08-07 DIAGNOSIS — Z5111 Encounter for antineoplastic chemotherapy: Secondary | ICD-10-CM | POA: Insufficient documentation

## 2023-08-07 DIAGNOSIS — R918 Other nonspecific abnormal finding of lung field: Secondary | ICD-10-CM | POA: Insufficient documentation

## 2023-08-07 DIAGNOSIS — R110 Nausea: Secondary | ICD-10-CM | POA: Diagnosis not present

## 2023-08-07 DIAGNOSIS — Z9012 Acquired absence of left breast and nipple: Secondary | ICD-10-CM | POA: Insufficient documentation

## 2023-08-07 DIAGNOSIS — C7951 Secondary malignant neoplasm of bone: Secondary | ICD-10-CM | POA: Insufficient documentation

## 2023-08-07 DIAGNOSIS — Z1732 Human epidermal growth factor receptor 2 negative status: Secondary | ICD-10-CM | POA: Insufficient documentation

## 2023-08-07 DIAGNOSIS — R530 Neoplastic (malignant) related fatigue: Secondary | ICD-10-CM | POA: Diagnosis not present

## 2023-08-07 DIAGNOSIS — Z1721 Progesterone receptor positive status: Secondary | ICD-10-CM | POA: Insufficient documentation

## 2023-08-07 DIAGNOSIS — Z170 Estrogen receptor positive status [ER+]: Secondary | ICD-10-CM

## 2023-08-07 DIAGNOSIS — G893 Neoplasm related pain (acute) (chronic): Secondary | ICD-10-CM

## 2023-08-07 DIAGNOSIS — Z923 Personal history of irradiation: Secondary | ICD-10-CM | POA: Insufficient documentation

## 2023-08-07 DIAGNOSIS — C50412 Malignant neoplasm of upper-outer quadrant of left female breast: Secondary | ICD-10-CM

## 2023-08-07 NOTE — Progress Notes (Addendum)
 Palliative Medicine [facility]  Telephone:(336) [phone] Fax:(336) [phone]   Name: ANNETE [name] Date: 08/07/2023 MRN: [id number]  DOB: 07/02/1965  Patient Care Team: Donnie Galea, MD as PCP - General (Family Medicine) Burnie Cartwright, RN as Registered Nurse Burnie Cartwright, RN as Registered Nurse Auther Bo, RN as Oncology Nurse Navigator Alane Hsu, RN as Oncology Nurse Navigator Johna Myers, MD as Consulting Physician (Radiation Oncology) Cameron Cea, MD as Consulting Physician (Hematology and Oncology) Caralyn Chandler, MD as Consulting Physician (General Surgery) Althea Atkinson, RPH-CPP as Pharmacist (Hematology and Oncology) [name], Giles Labrum, NP as Nurse Practitioner [facility] and Palliative Medicine)   I connected with Maryfrances Snell on 08/07/23 at 10:00 AM EDT by phone and verified that I am speaking with the correct person using two identifiers.   I discussed the limitations, risks, security and privacy concerns of performing an evaluation and management service by telemedicine and the availability of in-person appointments. I also discussed with the patient that there may be a patient responsible charge related to this service. The patient expressed understanding and agreed to proceed.   Other persons participating in the visit and their role in the encounter: N/A   Patient's location: Home   Provider's location: [facility]   INTERVAL HISTORY: ORIAN [name] is a 58 y.o. 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 Palliative asked to see for symptom and pain management and goals of care.   SOCIAL HISTORY:     reports that she has been smoking cigarettes. She has a 62 pack-year smoking history. She has never used smokeless tobacco. She reports that she does not drink  alcohol and does not use drugs.  ADVANCE DIRECTIVES:  None on file   CODE STATUS: Full code  PAST MEDICAL HISTORY: Past Medical History:  Diagnosis Date   Acute bronchitis 04/19/2021   Anxiety    over cancer diagnosis   Asthma    bronchial asthma    Cancer (HCC) 09/2019   left breast invasive mammary cancer   Chronic back pain    COPD (chronic obstructive pulmonary disease) (HCC)    smokes 1 1/2 ppd   Diabetes mellitus without complication (HCC)    GERD (gastroesophageal reflux disease)    Personal history of chemotherapy    Personal history of radiation therapy    Pneumonia    hx of 2018    Smoking     ALLERGIES:  is allergic to propoxyphene n-acetaminophen , nabumetone, and rofecoxib.  MEDICATIONS:  Current Outpatient Medications  Medication Sig Dispense Refill   abemaciclib  (VERZENIO ) 150 MG tablet Take 1 tablet (150 mg total) by mouth 2 (two) times daily. 60 tablet 6   calcium -vitamin D (OSCAL WITH D) 500-5 MG-MCG tablet Take 1 tablet by mouth daily. 30 tablet 5   famotidine  (PEPCID ) 20 MG tablet TAKE 1 TABLET BY MOUTH TWICE A DAY 180 tablet 1   gabapentin  (NEURONTIN ) 300 MG capsule Take 1 capsule (300 mg total) by mouth 2 (two) times daily. 180 capsule 3   hyaluronate sodium (RADIAPLEXRX) GEL Apply topically to affected area daily as directed. 170 g 3   ibuprofen  (ADVIL ) 600 MG tablet TAKE 1 TABLET BY MOUTH EVERY 8 HOURS AS NEEDED. 90 tablet 0   omeprazole  (PRILOSEC ) 20 MG capsule TAKE 1 CAPSULE BY MOUTH EVERY OTHER DAY 45 capsule 3   ondansetron  (  ZOFRAN ) 8 MG tablet Take 1 tablet (8 mg total) by mouth every 8 (eight) hours as needed for nausea or vomiting. 20 tablet 3   oxyCODONE  (OXYCONTIN ) 10 mg 12 hr tablet Take 1 tablet (10 mg total) by mouth every 12 (twelve) hours. 60 tablet 0   oxyCODONE -acetaminophen  (PERCOCET/ROXICET) 5-325 MG tablet Take 1 tablet by mouth every 4 (four) hours as needed for severe pain (pain score 7-10). 90 tablet 0   SUMAtriptan  (IMITREX ) 50  MG tablet Take 1 tablet (50 mg total) by mouth every 2 (two) hours as needed for migraine (max 2 doses in 24 hours.). May repeat in 2 hours if headache persists or recurs. 10 tablet 2   tiZANidine  (ZANAFLEX ) 2 MG tablet Take 1 tablet (2 mg total) by mouth every 8 (eight) hours as needed for muscle spasms. 30 tablet 0   VENTOLIN  HFA 108 (90 Base) MCG/ACT inhaler TAKE 2 PUFFS BY MOUTH EVERY 6 HOURS AS NEEDED FOR WHEEZE OR SHORTNESS OF BREATH 18 each 1   No current facility-administered medications for this visit.    VITAL SIGNS: There were no vitals taken for this visit. There were no vitals filed for this visit.   Estimated body mass index is 20.82 kg/m as calculated from the following:   Height as of 05/06/23: 5\' 4"  (1.626 m).   Weight as of 07/27/23: 121 lb 4.8 oz (55 kg).   PERFORMANCE STATUS (ECOG) : 1 - Symptomatic but completely ambulatory   IMPRESSION: Discussed the use of AI scribe software for clinical note transcription with the patient, who gave verbal consent to proceed.  History of Present Illness Julia [name] is a 58 year old female who I connected with by phone for symptom management follow-up. No acute distress. Patient reports feeling much better since holding Verzenio . Denies concerns with nausea, vomiting, constipation, or diarrhea. Decrease in fatigue. She is remaining as active as possible. Appetite is slowly improving.   She is currently taking Oxycontin  every twelve hours, gabapentin  twice daily, and tizanidine  as needed for pain and spasms. Feels current regimen is effective. Taking medications appropriately. No changes to regimen at t his time.    All questions answered and support provided.   I discussed the importance of continued conversation with family and their medical providers regarding overall plan of care and treatment options, ensuring decisions are within the context of the patients values and GOCs. Assessment & Plan Cancer Related Pain  management Pain controlled with oxycontin  and Percocet. Zanaflex  used for muscle relaxation. No refills needed. - Continue Oxycontin  10 mg every 12 hours. - Continue Percocet 5/325mg  as needed. - Continue Zanaflex  2 mg as needed. -Gabapentin  twice daily.    Heartburn Reports frequent heartburn. -Recommended consistent use of Prilosec   Nausea/Appetite  Managed with antiemetics.  No vomiting reported. GI symptoms have resolved since holding doses of Verzenio  as requested by Oncology team. - Continue anti-nausea medication as prescribed.  Follow-up I will plan to see patient back in the clinic in 3-4 weeks.   - Instruct to call if condition changes before next appointment.  Patient expressed understanding and was in agreement with this plan. She also understands that She can call the clinic at any time with any questions, concerns, or complaints.   Any controlled substances utilized were prescribed in the context of palliative care. PDMP has been reviewed.   I provided 20 minutes of non face-to-face telephone visit time during this encounter, and > 50% was spent counseling as documented under my assessment &  plan. Visit consisted of counseling and education dealing with the complex and emotionally intense issues of symptom management and palliative care in the setting of serious and potentially life-threatening illness.  Dellia Ferguson, AGPCNP-BC  Palliative Medicine Team/[facility]

## 2023-08-07 NOTE — Telephone Encounter (Signed)
 Left vm sbout scheduled telephone appt date and time

## 2023-08-10 ENCOUNTER — Inpatient Hospital Stay (HOSPITAL_BASED_OUTPATIENT_CLINIC_OR_DEPARTMENT_OTHER): Admitting: Hematology and Oncology

## 2023-08-10 DIAGNOSIS — Z170 Estrogen receptor positive status [ER+]: Secondary | ICD-10-CM

## 2023-08-10 DIAGNOSIS — C50412 Malignant neoplasm of upper-outer quadrant of left female breast: Secondary | ICD-10-CM

## 2023-08-10 MED ORDER — ABEMACICLIB 100 MG PO TABS: 100.0000 mg | ORAL_TABLET | Freq: Two times a day (BID) | ORAL | 3 refills | Status: DC

## 2023-08-10 NOTE — Assessment & Plan Note (Signed)
 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  ER 90%, PR 90%, HER-2 negative by FISH Posterior margin positive but no further surgery done because it is muscle on the back. T2N0 stage Ib Oncotype DX score 32: 20% risk of distant recurrence in 9 years   Treatment plan:  1. Adjuvant chemotherapy with dose dense Adriamycin  and Cytoxan  x4 followed by Taxol  weekly x12 completed 03/27/21 2. followed by radiation therapy started 04/26/20 3.  Followed by adjuvant antiestrogen therapy.  Started February 2022 ------------------------------------------------------------------------------------------------------------   Right neck swelling CT neck 11/12/2022: 9 mm level 5 lymph node, biopsy: Metastatic carcinoma consistent with breast primary, ER 95%, PR 80%, HER2 0-1+   Recommendation: PET CT scan 01/30/2023: 10 mm cervical LN, scattered pulm nodules below level of PET, Multiple bone mets Current treatment: Verzinio along with Faslodex  started 02/04/2023   Abemaciclib  toxicities:  Fatigue which is preventing her from staying as active as she used to be.    GI toxicities: We held Verzinio for 2 weeks.   Bone metastasis: On Xgeva  along with calcium  and vitamin D CT CAP 04/30/23: Bone mets more sclerotic (suggestive of response), lung nodules stable. CT abdomen pelvis 07/27/2023: Bone metastases stable  Return to clinic monthly for follow-ups with labs.

## 2023-08-10 NOTE — Progress Notes (Signed)
 HEMATOLOGY-ONCOLOGY TELEPHONE VISIT PROGRESS NOTE  I connected with our patient on 08/10/23 at  2:30 PM EDT by telephone and verified that I am speaking with the correct person using two identifiers.  I discussed the limitations, risks, security and privacy concerns of performing an evaluation and management service by telephone and the availability of in person appointments.  I also discussed with the patient that there may be a patient responsible charge related to this service. The patient expressed understanding and agreed to proceed.   History of Present Illness: Follow-up after stopping Verzinio for abdominal pain nausea vomiting and diarrhea  History of Present Illness Julia [name] is a 58 year old female with metastatic cancer who presents with gastrointestinal symptoms after starting Verzenio .  She experienced severe diarrhea daily for a week and vomiting three times during that week after starting Verzenio  at the end of October. Upon discontinuation of Verzenio , her symptoms resolved. A recent CT scan showed no significant changes, with some irritation in the colon likely due to diarrhea. Bone spots and small lung nodules remain unchanged and indeterminate. She is currently not on Verzenio  and denies any current vomiting or diarrhea.    Oncology History  Malignant neoplasm of upper-outer quadrant of left breast in female, estrogen receptor positive (HCC)  08/25/2019 Initial Diagnosis   Palpable left breast mass x1 month. Diagnostic mammogram and US  on 08/18/19 showed a 2.8cm mass at the 2 o'clock position, and one lymph node with mild cortical thickening in the left axilla. Biopsy on 08/25/19 showed invasive mammary carcinoma in the breast, grade 3, HER-2 equivocal by IHC (2+) negative by FISH, ER+ >90%, PR+ >90%, left axilla negative   10/07/2019 Surgery   Left lumpectomy and sentinel lymph node biopsy Alethea Andes) [phone]): Grade 3 IDC with DCIS, 3.3cm, grade 3, 6 left axillary lymph  nodes negative.   10/18/2019 Cancer Staging   Staging form: Breast, AJCC 8th Edition - Pathologic stage from 10/18/2019: Stage IB (pT2, pN0(sn), cM0, G3, ER+, PR+, HER2-)   10/21/2019 Oncotype testing   The Oncotype DX score was 32 predicting a risk of outside the breast recurrence over the next 9 years of 20% if the patient's only systemic therapy is tamoxifen for 5 years.    11/15/2019 - 03/27/2020 Chemotherapy   Adjuvant chemotherapy with dose dense Adriamycin  and Cytoxan  x4 (11/15/2019 - 12/27/2019) followed by Taxol  weekly x12 (01/09/2020 - 03/27/20).   04/25/2020 - 05/25/2020 Radiation Therapy   The patient initially received a dose of 42.56 Gy in 16 fractions to the breast using whole-breast tangent fields. This was delivered using a 3-D conformal technique. The pt received a boost delivering an additional 8 Gy in 4 fractions using a electron boost with electrons. The total dose was 50.56 Gy.   05/2020 -  Anti-estrogen oral therapy   Anastrozole    05/06/2021 Surgery   Left mastectomy: Benign (performed because of left breast fibrosis and pain)     REVIEW OF SYSTEMS:   Constitutional: Denies fevers, chills or abnormal weight loss All other systems were reviewed with the patient and are negative. 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  ER 90%, PR 90%, HER-2 negative by FISH Posterior margin positive but no further surgery done because it is muscle on the back. T2N0 stage Ib Oncotype DX  score 32: 20% risk of distant recurrence in 9 years   Treatment plan:  1. Adjuvant chemotherapy with dose dense Adriamycin  and Cytoxan  x4 followed by Taxol  weekly x12 completed 03/27/21 2. followed by radiation therapy started 04/26/20 3.  Followed by  adjuvant antiestrogen therapy.  Started February 2022 ------------------------------------------------------------------------------------------------------------   Right neck swelling CT neck 11/12/2022: 9 mm level 5 lymph node, biopsy: Metastatic carcinoma consistent with breast primary, ER 95%, PR 80%, HER2 0-1+   Recommendation: PET CT scan 01/30/2023: 10 mm cervical LN, scattered pulm nodules below level of PET, Multiple bone mets Current treatment: Verzinio along with Faslodex  started 02/04/2023   Abemaciclib  toxicities:  Fatigue which is preventing her from staying as active as she used to be.    GI toxicities: Abdominal pain, nausea vomiting and diarrhea: Resolved after stopping Verzenio  I reduced the dosage of Verzinio to 100 mg p.o. twice daily and sent a new prescription. She will start taking this as soon as the medicine arrives.   Bone metastasis: On Xgeva  along with calcium  and vitamin D CT CAP 04/30/23: Bone mets more sclerotic (suggestive of response), lung nodules stable. CT abdomen pelvis 07/27/2023: Bone metastases stable       I discussed the assessment and treatment plan with the patient. The patient was provided an opportunity to ask questions and all were answered. The patient agreed with the plan and demonstrated an understanding of the instructions. The patient was advised to call back or seek an in-person evaluation if the symptoms worsen or if the condition fails to improve as anticipated.   I provided 20 minutes of non-face-to-face time during this encounter.  This includes time for charting and coordination of care   Margert Sheerer, MD

## 2023-08-17 ENCOUNTER — Other Ambulatory Visit: Payer: Self-pay | Admitting: Nurse Practitioner

## 2023-08-17 DIAGNOSIS — G893 Neoplasm related pain (acute) (chronic): Secondary | ICD-10-CM

## 2023-08-17 DIAGNOSIS — Z515 Encounter for palliative care: Secondary | ICD-10-CM

## 2023-08-17 DIAGNOSIS — C50919 Malignant neoplasm of unspecified site of unspecified female breast: Secondary | ICD-10-CM

## 2023-08-17 MED ORDER — OXYCODONE-ACETAMINOPHEN 5-325 MG PO TABS: 1.0000 | ORAL_TABLET | ORAL | 0 refills | Status: DC | PRN

## 2023-08-17 MED ORDER — TIZANIDINE HCL 2 MG PO TABS: 2.0000 mg | ORAL_TABLET | Freq: Three times a day (TID) | ORAL | 0 refills | Status: DC | PRN

## 2023-08-26 ENCOUNTER — Inpatient Hospital Stay (HOSPITAL_BASED_OUTPATIENT_CLINIC_OR_DEPARTMENT_OTHER): Admitting: Nurse Practitioner

## 2023-08-26 ENCOUNTER — Inpatient Hospital Stay: Payer: Medicaid Other

## 2023-08-26 ENCOUNTER — Encounter: Payer: Self-pay | Admitting: Nurse Practitioner

## 2023-08-26 VITALS — BP 109/61 | HR 64 | Temp 98.2°F | Resp 16

## 2023-08-26 DIAGNOSIS — C7951 Secondary malignant neoplasm of bone: Secondary | ICD-10-CM

## 2023-08-26 DIAGNOSIS — C50919 Malignant neoplasm of unspecified site of unspecified female breast: Secondary | ICD-10-CM

## 2023-08-26 DIAGNOSIS — Z170 Estrogen receptor positive status [ER+]: Secondary | ICD-10-CM

## 2023-08-26 DIAGNOSIS — Z9012 Acquired absence of left breast and nipple: Secondary | ICD-10-CM | POA: Diagnosis not present

## 2023-08-26 DIAGNOSIS — C50412 Malignant neoplasm of upper-outer quadrant of left female breast: Secondary | ICD-10-CM

## 2023-08-26 DIAGNOSIS — G893 Neoplasm related pain (acute) (chronic): Secondary | ICD-10-CM | POA: Diagnosis not present

## 2023-08-26 DIAGNOSIS — Z1721 Progesterone receptor positive status: Secondary | ICD-10-CM | POA: Diagnosis not present

## 2023-08-26 DIAGNOSIS — Z515 Encounter for palliative care: Secondary | ICD-10-CM

## 2023-08-26 DIAGNOSIS — Z923 Personal history of irradiation: Secondary | ICD-10-CM | POA: Diagnosis not present

## 2023-08-26 DIAGNOSIS — Z1732 Human epidermal growth factor receptor 2 negative status: Secondary | ICD-10-CM | POA: Diagnosis not present

## 2023-08-26 DIAGNOSIS — R918 Other nonspecific abnormal finding of lung field: Secondary | ICD-10-CM | POA: Diagnosis not present

## 2023-08-26 DIAGNOSIS — R530 Neoplastic (malignant) related fatigue: Secondary | ICD-10-CM

## 2023-08-26 DIAGNOSIS — Z5111 Encounter for antineoplastic chemotherapy: Secondary | ICD-10-CM | POA: Diagnosis not present

## 2023-08-26 MED ORDER — OXYCODONE HCL ER 10 MG PO T12A: 10.0000 mg | EXTENDED_RELEASE_TABLET | Freq: Two times a day (BID) | ORAL | 0 refills | Status: DC

## 2023-08-26 MED ORDER — FULVESTRANT 250 MG/5ML IM SOSY
500.0000 mg | PREFILLED_SYRINGE | Freq: Once | INTRAMUSCULAR | Status: AC
  Administered 2023-08-26: 500 mg via INTRAMUSCULAR
  Filled 2023-08-26: qty 10

## 2023-08-26 MED ORDER — TIZANIDINE HCL 2 MG PO TABS
2.0000 mg | ORAL_TABLET | Freq: Three times a day (TID) | ORAL | 0 refills | Status: AC | PRN
Start: 2023-08-30 — End: ?

## 2023-08-26 MED ORDER — OXYCODONE-ACETAMINOPHEN 5-325 MG PO TABS: 1.0000 | ORAL_TABLET | ORAL | 0 refills | Status: DC | PRN

## 2023-08-26 NOTE — Progress Notes (Addendum)
 Palliative Medicine [facility]  Telephone:([phone] [phone]   Name: Julia [name] Date: 08/26/2023 MRN: [id number]  DOB: 03/09/1966  Patient Care Team: Donnie Galea, MD as PCP - General (Family Medicine) Burnie Cartwright, RN as Registered Nurse Burnie Cartwright, RN as Registered Nurse Auther Bo, RN as Oncology Nurse Navigator Alane Hsu, RN as Oncology Nurse Navigator Johna Myers, MD as Consulting Physician (Radiation Oncology) Cameron Cea, MD as Consulting Physician (Hematology and Oncology) Caralyn Chandler, MD as Consulting Physician (General Surgery) Althea Atkinson, RPH-CPP as Pharmacist (Hematology and Oncology) [name], Giles Labrum, NP as Nurse Practitioner (Hospice and Palliative Medicine)   INTERVAL HISTORY: Julia [name] is a 59 y.o. 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 Palliative asked to see for symptom and pain management and goals of care.   SOCIAL HISTORY:     reports that she has been smoking cigarettes. She has a 62 pack-year smoking history. She has never used smokeless tobacco. She reports that she does not drink alcohol and does not use drugs.  ADVANCE DIRECTIVES:  None on file   CODE STATUS: Full code  PAST MEDICAL HISTORY: Past Medical History:  Diagnosis Date   Acute bronchitis 04/19/2021   Anxiety    over cancer diagnosis   Asthma    bronchial asthma    Cancer (HCC) 09/2019   left breast invasive mammary cancer   Chronic back pain    COPD (chronic obstructive pulmonary disease) (HCC)    smokes 1 1/2 ppd   Diabetes mellitus without complication (HCC)    GERD (gastroesophageal reflux disease)    Personal history of chemotherapy    Personal history of radiation therapy    Pneumonia    hx of 2018    Smoking      ALLERGIES:  is allergic to propoxyphene n-acetaminophen , nabumetone, and rofecoxib.  MEDICATIONS:  Current Outpatient Medications  Medication Sig Dispense Refill   abemaciclib  (VERZENIO ) 100 MG tablet Take 1 tablet (100 mg total) by mouth 2 (two) times daily. 60 tablet 3   calcium -vitamin D (OSCAL WITH D) 500-5 MG-MCG tablet Take 1 tablet by mouth daily. 30 tablet 5   famotidine  (PEPCID ) 20 MG tablet TAKE 1 TABLET BY MOUTH TWICE A DAY 180 tablet 1   gabapentin  (NEURONTIN ) 300 MG capsule Take 1 capsule (300 mg total) by mouth 2 (two) times daily. 180 capsule 3   hyaluronate sodium (RADIAPLEXRX) GEL Apply topically to affected area daily as directed. 170 g 3   ibuprofen  (ADVIL ) 600 MG tablet TAKE 1 TABLET BY MOUTH EVERY 8 HOURS AS NEEDED. 90 tablet 0   omeprazole  (PRILOSEC ) 20 MG capsule TAKE 1 CAPSULE BY MOUTH EVERY OTHER DAY 45 capsule 3   ondansetron  (ZOFRAN ) 8 MG tablet Take 1 tablet (8 mg total) by mouth every 8 (eight) hours as needed for nausea or vomiting. 20 tablet 3   [START ON 09/05/2023] oxyCODONE  (OXYCONTIN ) 10 mg 12 hr tablet Take 1 tablet (10 mg total) by mouth every 12 (twelve) hours. 60 tablet 0   [START ON 08/30/2023] oxyCODONE -acetaminophen  (PERCOCET/ROXICET) 5-325 MG tablet Take 1 tablet by mouth every 4 (four) hours as needed for severe pain (pain score 7-10). 90 tablet 0   SUMAtriptan  (IMITREX ) 50 MG tablet Take 1 tablet (50 mg total) by mouth every 2 (two) hours as needed for migraine (max  2 doses in 24 hours.). May repeat in 2 hours if headache persists or recurs. 10 tablet 2   [START ON 08/30/2023] tiZANidine  (ZANAFLEX ) 2 MG tablet Take 1 tablet (2 mg total) by mouth every 8 (eight) hours as needed for muscle spasms. 30 tablet 0   VENTOLIN  HFA 108 (90 Base) MCG/ACT inhaler TAKE 2 PUFFS BY MOUTH EVERY 6 HOURS AS NEEDED FOR WHEEZE OR SHORTNESS OF BREATH 18 each 1   No current facility-administered medications for this visit.    VITAL SIGNS: There were no vitals taken  for this visit. There were no vitals filed for this visit.   Estimated body mass index is 20.82 kg/m as calculated from the following:   Height as of 05/06/23: 5' 4 (1.626 m).   Weight as of 07/27/23: 121 lb 4.8 oz (55 kg).   PERFORMANCE STATUS (ECOG) : 1 - Symptomatic but completely ambulatory  Assessment NAD Normal breathing pattern RRR AAO x3 Left arm redness, bump s/p tick removed.   IMPRESSION: Discussed the use of AI scribe software for clinical note transcription with the patient, who gave verbal consent to proceed.  History of Present Illness Julia [name] is a 58 year old female who presented to clinic for symptom management follow-up.  Denies concerns with nausea, vomiting, constipation, or diarrhea. Decrease in fatigue. She is remaining as active as possible. Appetite is fair. Some days are better than others. Weight is stable at 121lbs.   She experiences ongoing pain that has significantly impacted her life, leading to recent loss of her job due to stress and inability to manage the pain while working throuhgout the day. An incident on Mother's Day weekend exemplified the severity of her pain, as she had to lie on the floor in the laundry room to alleviate her back pain, which was ineffective. Her current pain management regimen includes gabapentin  twice a day, OxyContin  10 mg every twelve hours, and oxycodone  every four hours as needed. She feels her regimen is effective when taking. No adjustments at this time.   Patient is emotional expressing life stressors and changes. She is emotional about her job loss. Support provided. Education provided and Social Work referral offered. [name] verbalized understanding and agreement.   All questions answered and support provided.   I discussed the importance of continued conversation with family and their medical providers regarding overall plan of care and treatment options, ensuring decisions are within the context of the  patients values and GOCs. Assessment & Plan Cancer pain Chronic cancer pain managed with gabapentin , OxyContin , and oxycodone .  Explained tapering off opioids is feasible if pain improves. - Continue gabapentin  twice daily. - Continue OxyContin  10 mg every 12 hours. - Continue oxycodone  every 4 hours as needed. - Reassure regarding addiction concerns and emphasize the safety of current pain management.  Stressors  Metastatic Cancer diagnosis and symptom burden. Patient is interested in pursing disability benefits due to recent job loss.Aaron Aas She was unaware of eligibility. - Connect with a Child psychotherapist to apply for social disability benefits. Emotional support.   Follow-up I will plan to see patient back in the clinic in 3-4 weeks.   - Instruct to call if condition changes before next appointment.  Patient expressed understanding and was in agreement with this plan. She also understands that She can call the clinic at any time with any questions, concerns, or complaints.   Any controlled substances utilized were prescribed in the context of palliative care. PDMP has been reviewed.  Visit consisted of counseling and education dealing with the complex and emotionally intense issues of symptom management and palliative care in the setting of serious and potentially life-threatening illness.  Dellia Ferguson, AGPCNP-BC  Palliative Medicine Team/[facility]

## 2023-08-27 ENCOUNTER — Inpatient Hospital Stay: Admitting: Licensed Clinical Social Worker

## 2023-08-27 NOTE — Progress Notes (Signed)
 [facility] Clinical Social Work  Initial Assessment   Julia [name] is a 58 y.o. year old female contacted by phone. Clinical Social Work was referred by palliative care for emotional support and assistance with disability.   SDOH (Social Determinants of Health) assessments performed: Yes SDOH Interventions    Flowsheet Row Clinical Support from 08/27/2023 in [facility] - A Dept Of [place]  SDOH Interventions   Food Insecurity Interventions Intervention Not Indicated  Housing Interventions Intervention Not Indicated  Utilities Interventions Intervention Not Indicated       SDOH Screenings   Food Insecurity: No Food Insecurity (08/27/2023)  Housing: Unknown (08/27/2023)  Transportation Needs: Patient Declined (05/31/2023)  Utilities: Not At Risk (08/27/2023)  Depression (PHQ2-9): High Risk (07/23/2021)  Financial Resource Strain: Patient Declined (05/31/2023)  Physical Activity: Unknown (05/31/2023)  Social Connections: Unknown (05/31/2023)  Stress: Stress Concern Present (05/31/2023)  Tobacco Use: High Risk (08/26/2023)     Distress Screen completed: No   Family/Social Information:  Housing Arrangement: patient lives with husband Family members/support persons in your life? Family Transportation concerns: no  Employment: Unemployed - recent job loss. Interested in applying for disability  Income source: husband's employment Financial concerns: Yes, due to illness and/or loss of work during treatment Type of concern: no particular concern currently Food access concerns: no Services Currently in place:  [employer]  Coping/ Adjustment to diagnosis: Patient understands treatment plan and what happens next? yes, pt has been on treatment for Stage IV metastatic breast cancer since 2024 Patient reported stressors: job loss Hopes and/or priorities: hopes to get on disability to relieve financial strain and remove stress of trying to work  while dealing with stage IV cancer, treatment, and symptoms Current coping skills/ strengths: Ability for insight , Motivation for treatment/growth , and Supportive family/friends     SUMMARY: Current SDOH Barriers:  Job loss leading to financial strain  Clinical Social Work Clinical Goal(s):  Patient will work with [employer] to address needs related to financial strain by applying for disability  Interventions: Provided CSW contact information and encouraged patient to call with any questions or concerns Referred patient to [employer] pt information on cancer foundations for potential assistance, in particular [employer]   Follow Up Plan: Patient will communicate with [facility] to complete disability application. Pt will contact CSW if other financial or emotional needs arise Patient verbalizes understanding of plan: Yes    [name] E Sreekar Broyhill, LCSW Clinical Social Worker [facility]  Patient is participating in a Managed Medicaid Plan:  Yes

## 2023-09-05 ENCOUNTER — Other Ambulatory Visit: Payer: Self-pay | Admitting: Family Medicine

## 2023-09-23 ENCOUNTER — Encounter: Payer: Self-pay | Admitting: Nurse Practitioner

## 2023-09-23 ENCOUNTER — Inpatient Hospital Stay: Payer: Medicaid Other | Attending: Hematology and Oncology

## 2023-09-23 ENCOUNTER — Inpatient Hospital Stay (HOSPITAL_BASED_OUTPATIENT_CLINIC_OR_DEPARTMENT_OTHER): Admitting: Nurse Practitioner

## 2023-09-23 VITALS — BP 111/61 | HR 82 | Temp 98.2°F | Resp 18 | Ht 64.0 in | Wt 124.5 lb

## 2023-09-23 DIAGNOSIS — Z170 Estrogen receptor positive status [ER+]: Secondary | ICD-10-CM

## 2023-09-23 DIAGNOSIS — M792 Neuralgia and neuritis, unspecified: Secondary | ICD-10-CM | POA: Diagnosis not present

## 2023-09-23 DIAGNOSIS — R530 Neoplastic (malignant) related fatigue: Secondary | ICD-10-CM

## 2023-09-23 DIAGNOSIS — C50919 Malignant neoplasm of unspecified site of unspecified female breast: Secondary | ICD-10-CM

## 2023-09-23 DIAGNOSIS — G893 Neoplasm related pain (acute) (chronic): Secondary | ICD-10-CM | POA: Diagnosis not present

## 2023-09-23 DIAGNOSIS — Z515 Encounter for palliative care: Secondary | ICD-10-CM | POA: Diagnosis not present

## 2023-09-23 DIAGNOSIS — Z5111 Encounter for antineoplastic chemotherapy: Secondary | ICD-10-CM | POA: Insufficient documentation

## 2023-09-23 DIAGNOSIS — C50412 Malignant neoplasm of upper-outer quadrant of left female breast: Secondary | ICD-10-CM | POA: Insufficient documentation

## 2023-09-23 DIAGNOSIS — C7951 Secondary malignant neoplasm of bone: Secondary | ICD-10-CM | POA: Insufficient documentation

## 2023-09-23 DIAGNOSIS — Z1732 Human epidermal growth factor receptor 2 negative status: Secondary | ICD-10-CM | POA: Diagnosis not present

## 2023-09-23 DIAGNOSIS — Z1721 Progesterone receptor positive status: Secondary | ICD-10-CM | POA: Insufficient documentation

## 2023-09-23 MED ORDER — GABAPENTIN 300 MG PO CAPS: 300.0000 mg | ORAL_CAPSULE | Freq: Three times a day (TID) | ORAL | 3 refills | Status: AC

## 2023-09-23 MED ORDER — OXYCODONE HCL ER 10 MG PO T12A: 10.0000 mg | EXTENDED_RELEASE_TABLET | Freq: Two times a day (BID) | ORAL | 0 refills | Status: DC

## 2023-09-23 MED ORDER — TIZANIDINE HCL 2 MG PO TABS: 2.0000 mg | ORAL_TABLET | Freq: Three times a day (TID) | ORAL | 3 refills | Status: DC | PRN

## 2023-09-23 MED ORDER — FULVESTRANT 250 MG/5ML IM SOSY
500.0000 mg | PREFILLED_SYRINGE | Freq: Once | INTRAMUSCULAR | Status: AC
  Administered 2023-09-23: 500 mg via INTRAMUSCULAR
  Filled 2023-09-23: qty 10

## 2023-09-23 MED ORDER — OXYCODONE-ACETAMINOPHEN 5-325 MG PO TABS: 1.0000 | ORAL_TABLET | ORAL | 0 refills | Status: DC | PRN

## 2023-09-23 NOTE — Progress Notes (Signed)
 Palliative Medicine [facility]  Telephone:(336) [phone] Fax:(336) [phone]   Name: Julia [name] Date: 09/23/2023 MRN: [id number]  DOB: 08-13-1965  Patient Care Team: Donnie Galea, MD as PCP - General (Family Medicine) Burnie Cartwright, RN as Registered Nurse Burnie Cartwright, RN as Registered Nurse Auther Bo, RN (Inactive) as Oncology Nurse Navigator Alane Hsu, RN as Oncology Nurse Navigator Johna Myers, MD as Consulting Physician (Radiation Oncology) Cameron Cea, MD as Consulting Physician (Hematology and Oncology) Caralyn Chandler, MD as Consulting Physician (General Surgery) Althea Atkinson, RPH-CPP as Pharmacist (Hematology and Oncology) [name], Giles Labrum, NP as Nurse Practitioner (Hospice and Palliative Medicine)   INTERVAL HISTORY: Julia [name] is a 58 y.o. 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 Palliative asked to see for symptom and pain management and goals of care.   SOCIAL HISTORY:     reports that she has been smoking cigarettes. She has a 62 pack-year smoking history. She has never used smokeless tobacco. She reports that she does not drink alcohol and does not use drugs.  ADVANCE DIRECTIVES:  None on file   CODE STATUS: Full code  PAST MEDICAL HISTORY: Past Medical History:  Diagnosis Date   Acute bronchitis 04/19/2021   Anxiety    over cancer diagnosis   Asthma    bronchial asthma    Cancer (HCC) 09/2019   left breast invasive mammary cancer   Chronic back pain    COPD (chronic obstructive pulmonary disease) (HCC)    smokes 1 1/2 ppd   Diabetes mellitus without complication (HCC)    GERD (gastroesophageal reflux disease)    Personal history of chemotherapy    Personal history of radiation therapy    Pneumonia    hx of 2018    Smoking      ALLERGIES:  is allergic to propoxyphene n-acetaminophen , nabumetone, and rofecoxib.  MEDICATIONS:  Current Outpatient Medications  Medication Sig Dispense Refill   abemaciclib  (VERZENIO ) 100 MG tablet Take 1 tablet (100 mg total) by mouth 2 (two) times daily. 60 tablet 3   calcium -vitamin D (OSCAL WITH D) 500-5 MG-MCG tablet Take 1 tablet by mouth daily. 30 tablet 5   famotidine  (PEPCID ) 20 MG tablet TAKE 1 TABLET BY MOUTH TWICE A DAY 180 tablet 1   gabapentin  (NEURONTIN ) 300 MG capsule Take 1 capsule (300 mg total) by mouth 2 (two) times daily. 180 capsule 3   hyaluronate sodium (RADIAPLEXRX) GEL Apply topically to affected area daily as directed. 170 g 3   ibuprofen  (ADVIL ) 600 MG tablet TAKE 1 TABLET BY MOUTH EVERY 8 HOURS AS NEEDED. 90 tablet 0   omeprazole  (PRILOSEC ) 20 MG capsule TAKE 1 CAPSULE BY MOUTH EVERY OTHER DAY 45 capsule 3   ondansetron  (ZOFRAN ) 8 MG tablet Take 1 tablet (8 mg total) by mouth every 8 (eight) hours as needed for nausea or vomiting. 20 tablet 3   oxyCODONE  (OXYCONTIN ) 10 mg 12 hr tablet Take 1 tablet (10 mg total) by mouth every 12 (twelve) hours. 60 tablet 0   oxyCODONE -acetaminophen  (PERCOCET/ROXICET) 5-325 MG tablet Take 1 tablet by mouth every 4 (four) hours as needed for severe pain (pain score 7-10). 90 tablet 0   SUMAtriptan  (IMITREX ) 50 MG tablet Take 1 tablet (50 mg total) by mouth every 2 (two) hours as needed for migraine (max 2 doses in 24 hours.).  May repeat in 2 hours if headache persists or recurs. 10 tablet 2   tiZANidine  (ZANAFLEX ) 2 MG tablet Take 1 tablet (2 mg total) by mouth every 8 (eight) hours as needed for muscle spasms. 30 tablet 0   VENTOLIN  HFA 108 (90 Base) MCG/ACT inhaler TAKE 2 PUFFS BY MOUTH EVERY 6 HOURS AS NEEDED FOR WHEEZE OR SHORTNESS OF BREATH 18 each 1   No current facility-administered medications for this visit.    VITAL SIGNS: BP 111/61 (BP Location: Left Arm, Patient Position: Sitting)   Pulse 82   Temp 98.2 F  (36.8 C) (Temporal)   Resp 18   Ht 5' 4 (1.626 m)   Wt 124 lb 8 oz (56.5 kg)   SpO2 100%   BMI 21.37 kg/m  Filed Weights   09/23/23 0919  Weight: 124 lb 8 oz (56.5 kg)     Estimated body mass index is 21.37 kg/m as calculated from the following:   Height as of this encounter: 5' 4 (1.626 m).   Weight as of this encounter: 124 lb 8 oz (56.5 kg).   PERFORMANCE STATUS (ECOG) : 1 - Symptomatic but completely ambulatory  Assessment NAD Normal breathing pattern RRR AAO x3 Left arm redness, bump s/p tick removed.   IMPRESSION: Discussed the use of AI scribe software for clinical note transcription with the patient, who gave verbal consent to proceed.  History of Present Illness Julia [name] is a 58 year old female who presented to clinic for symptom management follow-up.  Denies concerns with nausea, vomiting, constipation, or diarrhea. Decrease in fatigue. She is remaining as active as possible. Appetite is fair. Some days are better than others. Weight is up to 124lbs.   Patient has not worked over the past several weeks following the loss of employment. Expresses feelings of boredom being at home majority of her time. We discussed ways to include additional activity in her schedule. She is anxiously awaiting decisions on disability and other financial resources.   Patient reports pain is controlled on current regimen. Regimen includes  gabapentin  three times a day, OxyContin  10 mg every twelve hours, and oxycodone  every four hours as needed. Baclofen as needed for muscle spasms which occurs mainly late evening and night. She feels her regimen is effective when taking. No adjustments at this time.    All questions answered and support provided.   I discussed the importance of continued conversation with family and their medical providers regarding overall plan of care and treatment options, ensuring decisions are within the context of the patients values and GOCs. Assessment  & Plan Cancer pain Chronic cancer pain managed with gabapentin , OxyContin , and oxycodone .  Explained tapering off opioids is feasible if pain improves. - Continue gabapentin  three times daily. - Continue OxyContin  10 mg every 12 hours. - Continue oxycodone  every 4 hours as needed. -Baclofen three times daily as needed.  - Reassure regarding addiction concerns and emphasize the safety of current pain management.  Stressors  Metastatic Cancer diagnosis and symptom burden. Patient is interested in pursing disability benefits due to recent job loss.Aaron Aas She was unaware of eligibility. - Continue with supportive care via  social worker regarding social disability benefits. Emotional support.   Follow-up I will plan to see patient back in the clinic in 3-4 weeks.   - Instruct to call if condition changes before next appointment.  Patient expressed understanding and was in agreement with this plan. She also understands that She can call the clinic at  any time with any questions, concerns, or complaints.   Any controlled substances utilized were prescribed in the context of palliative care. PDMP has been reviewed.   Visit consisted of counseling and education dealing with the complex and emotionally intense issues of symptom management and palliative care in the setting of serious and potentially life-threatening illness.  Dellia Ferguson, AGPCNP-BC  Palliative Medicine Team/[facility]

## 2023-10-08 ENCOUNTER — Encounter: Payer: Self-pay | Admitting: Licensed Clinical Social Worker

## 2023-10-08 NOTE — Progress Notes (Signed)
 [facility] CSW Progress Note  Clinical Child psychotherapist received TC from pt to follow-up on social security disability.  Pt had been referred to [facility] and is working with them on her application.    Interventions: Provided patient with information about contact for [facility]/ [facility]       Follow Up Plan:  Patient will talk with [facility] for updates on disability. Pt will contact this CSW for further support needs    Darrio Bade E Brad Lieurance, LCSW Clinical Social Worker [facility]    Patient is participating in a Managed Medicaid Plan:  Yes

## 2023-10-11 ENCOUNTER — Other Ambulatory Visit: Payer: Self-pay | Admitting: Family Medicine

## 2023-10-15 ENCOUNTER — Other Ambulatory Visit: Payer: Self-pay

## 2023-10-15 ENCOUNTER — Other Ambulatory Visit: Payer: Self-pay | Admitting: Nurse Practitioner

## 2023-10-15 DIAGNOSIS — Z515 Encounter for palliative care: Secondary | ICD-10-CM

## 2023-10-15 DIAGNOSIS — C50412 Malignant neoplasm of upper-outer quadrant of left female breast: Secondary | ICD-10-CM

## 2023-10-15 DIAGNOSIS — G893 Neoplasm related pain (acute) (chronic): Secondary | ICD-10-CM

## 2023-10-15 DIAGNOSIS — C50919 Malignant neoplasm of unspecified site of unspecified female breast: Secondary | ICD-10-CM

## 2023-10-15 MED ORDER — OXYCODONE-ACETAMINOPHEN 5-325 MG PO TABS: 1.0000 | ORAL_TABLET | ORAL | 0 refills | Status: DC | PRN

## 2023-10-20 NOTE — Assessment & Plan Note (Signed)
 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  ER 90%, PR 90%, HER-2 negative by FISH Posterior margin positive but no further surgery done because it is muscle on the back. T2N0 stage Ib Oncotype DX score 32: 20% risk of distant recurrence in 9 years   Treatment plan:  1. Adjuvant chemotherapy with dose dense Adriamycin  and Cytoxan  x4 followed by Taxol  weekly x12 completed 03/27/21 2. followed by radiation therapy started 04/26/20 3.  Followed by adjuvant antiestrogen therapy.  Started February 2022 ------------------------------------------------------------------------------------------------------------   Right neck swelling CT neck 11/12/2022: 9 mm level 5 lymph node, biopsy: Metastatic carcinoma consistent with breast primary, ER 95%, PR 80%, HER2 0-1+   Recommendation: PET CT scan 01/30/2023: 10 mm cervical LN, scattered pulm nodules below level of PET, Multiple bone mets Current treatment: Verzinio along with Faslodex  started 02/04/2023   Abemaciclib  toxicities:  Fatigue which is preventing her from staying as active as she used to be.    GI toxicities: Abdominal pain, nausea vomiting and diarrhea: Resolved after stopping Verzenio  I reduced the dosage of Verzinio to 100 mg p.o. twice daily and sent a new prescription. She will start taking this as soon as the medicine arrives.   Bone metastasis: On Xgeva  along with calcium  and vitamin D CT CAP 04/30/23: Bone mets more sclerotic (suggestive of response), lung nodules stable. CT abdomen pelvis 07/27/2023: Bone metastases stable

## 2023-10-21 ENCOUNTER — Inpatient Hospital Stay (HOSPITAL_BASED_OUTPATIENT_CLINIC_OR_DEPARTMENT_OTHER): Payer: Medicaid Other | Admitting: Hematology and Oncology

## 2023-10-21 ENCOUNTER — Encounter: Payer: Self-pay | Admitting: Nurse Practitioner

## 2023-10-21 ENCOUNTER — Inpatient Hospital Stay (HOSPITAL_BASED_OUTPATIENT_CLINIC_OR_DEPARTMENT_OTHER): Admitting: Nurse Practitioner

## 2023-10-21 ENCOUNTER — Inpatient Hospital Stay: Payer: Medicaid Other | Attending: Hematology and Oncology

## 2023-10-21 ENCOUNTER — Inpatient Hospital Stay: Payer: Medicaid Other

## 2023-10-21 ENCOUNTER — Encounter

## 2023-10-21 VITALS — BP 122/56 | HR 65 | Temp 98.4°F | Resp 18 | Ht 64.0 in | Wt 128.7 lb

## 2023-10-21 DIAGNOSIS — Z170 Estrogen receptor positive status [ER+]: Secondary | ICD-10-CM

## 2023-10-21 DIAGNOSIS — Z5111 Encounter for antineoplastic chemotherapy: Secondary | ICD-10-CM | POA: Insufficient documentation

## 2023-10-21 DIAGNOSIS — C50919 Malignant neoplasm of unspecified site of unspecified female breast: Secondary | ICD-10-CM

## 2023-10-21 DIAGNOSIS — Z79899 Other long term (current) drug therapy: Secondary | ICD-10-CM | POA: Diagnosis not present

## 2023-10-21 DIAGNOSIS — R530 Neoplastic (malignant) related fatigue: Secondary | ICD-10-CM

## 2023-10-21 DIAGNOSIS — Z1721 Progesterone receptor positive status: Secondary | ICD-10-CM | POA: Insufficient documentation

## 2023-10-21 DIAGNOSIS — Z1732 Human epidermal growth factor receptor 2 negative status: Secondary | ICD-10-CM | POA: Diagnosis not present

## 2023-10-21 DIAGNOSIS — M62838 Other muscle spasm: Secondary | ICD-10-CM

## 2023-10-21 DIAGNOSIS — Z515 Encounter for palliative care: Secondary | ICD-10-CM

## 2023-10-21 DIAGNOSIS — C50412 Malignant neoplasm of upper-outer quadrant of left female breast: Secondary | ICD-10-CM | POA: Diagnosis not present

## 2023-10-21 DIAGNOSIS — G893 Neoplasm related pain (acute) (chronic): Secondary | ICD-10-CM | POA: Diagnosis not present

## 2023-10-21 DIAGNOSIS — C7951 Secondary malignant neoplasm of bone: Secondary | ICD-10-CM | POA: Diagnosis not present

## 2023-10-21 LAB — CBC WITH DIFFERENTIAL (CANCER CENTER ONLY)
Abs Immature Granulocytes: 0.02 K/uL (ref 0.00–0.07)
Basophils Absolute: 0.1 K/uL (ref 0.0–0.1)
Basophils Relative: 1 %
Eosinophils Absolute: 0.1 K/uL (ref 0.0–0.5)
Eosinophils Relative: 2 %
HCT: 36.6 % (ref 36.0–46.0)
Hemoglobin: 13.1 g/dL (ref 12.0–15.0)
Immature Granulocytes: 0 %
Lymphocytes Relative: 28 %
Lymphs Abs: 1.6 K/uL (ref 0.7–4.0)
MCH: 35.3 pg — ABNORMAL HIGH (ref 26.0–34.0)
MCHC: 35.8 g/dL (ref 30.0–36.0)
MCV: 98.7 fL (ref 80.0–100.0)
Monocytes Absolute: 0.4 K/uL (ref 0.1–1.0)
Monocytes Relative: 8 %
Neutro Abs: 3.4 K/uL (ref 1.7–7.7)
Neutrophils Relative %: 61 %
Platelet Count: 177 K/uL (ref 150–400)
RBC: 3.71 MIL/uL — ABNORMAL LOW (ref 3.87–5.11)
RDW: 13.4 % (ref 11.5–15.5)
WBC Count: 5.6 K/uL (ref 4.0–10.5)
nRBC: 0 % (ref 0.0–0.2)

## 2023-10-21 LAB — CMP (CANCER CENTER ONLY)
ALT: 6 U/L (ref 0–44)
AST: 9 U/L — ABNORMAL LOW (ref 15–41)
Albumin: 3.9 g/dL (ref 3.5–5.0)
Alkaline Phosphatase: 40 U/L (ref 38–126)
Anion gap: 6 (ref 5–15)
BUN: 9 mg/dL (ref 6–20)
CO2: 26 mmol/L (ref 22–32)
Calcium: 9.1 mg/dL (ref 8.9–10.3)
Chloride: 106 mmol/L (ref 98–111)
Creatinine: 0.82 mg/dL (ref 0.44–1.00)
GFR, Estimated: >60 mL/min (ref >60)
Glucose, Bld: 107 mg/dL — ABNORMAL HIGH (ref 70–99)
Potassium: 4.2 mmol/L (ref 3.5–5.1)
Sodium: 138 mmol/L (ref 135–145)
Total Bilirubin: 0.3 mg/dL (ref 0.0–1.2)
Total Protein: 6.7 g/dL (ref 6.5–8.1)

## 2023-10-21 MED ORDER — ALBUTEROL SULFATE HFA 108 (90 BASE) MCG/ACT IN AERS
2.0000 | INHALATION_SPRAY | Freq: Once | RESPIRATORY_TRACT | Status: DC | PRN
Start: 2023-10-21 — End: 2023-10-21

## 2023-10-21 MED ORDER — METHYLPREDNISOLONE SODIUM SUCC 125 MG IJ SOLR: 125.0000 mg | Freq: Once | INTRAMUSCULAR | Status: DC | PRN

## 2023-10-21 MED ORDER — FULVESTRANT 250 MG/5ML IM SOSY
500.0000 mg | PREFILLED_SYRINGE | Freq: Once | INTRAMUSCULAR | Status: AC
  Administered 2023-10-21: 500 mg via INTRAMUSCULAR
  Filled 2023-10-21: qty 10

## 2023-10-21 MED ORDER — DIPHENHYDRAMINE HCL 50 MG/ML IJ SOLN: 50.0000 mg | Freq: Once | INTRAMUSCULAR | Status: DC | PRN

## 2023-10-21 MED ORDER — EPINEPHRINE 0.3 MG/0.3ML IJ SOAJ: 0.3000 mg | Freq: Once | INTRAMUSCULAR | Status: DC | PRN

## 2023-10-21 MED ORDER — DENOSUMAB 120 MG/1.7ML ~~LOC~~ SOLN
120.0000 mg | Freq: Once | SUBCUTANEOUS | Status: AC
Start: 2023-10-21 — End: 2023-10-21
  Administered 2023-10-21: 120 mg via SUBCUTANEOUS
  Filled 2023-10-21: qty 1.7

## 2023-10-21 MED ORDER — FAMOTIDINE IN NACL 20-0.9 MG/50ML-% IV SOLN: 20.0000 mg | Freq: Once | INTRAVENOUS | Status: DC | PRN

## 2023-10-21 MED ORDER — IBUPROFEN 800 MG PO TABS: 800.0000 mg | ORAL_TABLET | Freq: Three times a day (TID) | ORAL | 1 refills | Status: DC | PRN

## 2023-10-21 NOTE — Progress Notes (Signed)
 Palliative Medicine [facility]  [phone] [phone]   Name: Julia [name] Date: 10/21/2023 MRN: [id number]  DOB: [birth year]  Patient Care Team: Julia Arlyss GORMAN, MD as PCP - General (Family Medicine) Julia Jesusa HERO, RN as Registered Nurse Julia Jesusa HERO, RN as Registered Nurse Julia Stephane BROCKS, RN (Inactive) as Oncology Nurse Navigator Julia Nanetta SAILOR, RN as Oncology Nurse Navigator Julia Rush, MD as Consulting Physician (Radiation Oncology) Julia Potts, MD as Consulting Physician (Hematology and Oncology) Julia Deward MOULD, MD as Consulting Physician (General Surgery) Julia [name], RPH-CPP as Pharmacist (Hematology and Oncology) Julia [name] as Nurse Practitioner [name]   I connected with Julia [name] on 10/21/23 at  9:30 AM EDT by phone and verified that I am speaking with the correct person using two identifiers.   I discussed the limitations, risks, security and privacy concerns of performing an evaluation and management service by telemedicine and the availability of in-person appointments. I also discussed with the patient that there may be a patient responsible charge related to this service. The patient expressed understanding and agreed to proceed.   Other persons participating in the visit and their role in the encounter: N/A   Patient's location: Home  Provider's location: [place]   INTERVAL HISTORY: Julia [name] is a 58 y.o. 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 Palliative asked to see for symptom and pain management and goals of care.   SOCIAL HISTORY:     reports that she has been smoking cigarettes. She has a 62 pack-year smoking history. She has never used smokeless tobacco. She reports that she does not  drink alcohol and does not use drugs.  ADVANCE DIRECTIVES:  None on file   CODE STATUS: Full code  PAST MEDICAL HISTORY: Past Medical History:  Diagnosis Date   Acute bronchitis 04/19/2021   Anxiety    over cancer diagnosis   Asthma    bronchial asthma    Cancer (HCC) 09/2019   left breast invasive mammary cancer   Chronic back pain    COPD (chronic obstructive pulmonary disease) (HCC)    smokes 1 1/2 ppd   Diabetes mellitus without complication (HCC)    GERD (gastroesophageal reflux disease)    Personal history of chemotherapy    Personal history of radiation therapy    Pneumonia    hx of 2018    Smoking     ALLERGIES:  is allergic to propoxyphene n-acetaminophen , nabumetone, and rofecoxib.  MEDICATIONS:  Current Outpatient Medications  Medication Sig Dispense Refill   abemaciclib  (VERZENIO ) 100 MG tablet Take 1 tablet (100 mg total) by mouth 2 (two) times daily. 60 tablet 3   calcium -vitamin D (OSCAL WITH D) 500-5 MG-MCG tablet Take 1 tablet by mouth daily. 30 tablet 5   famotidine  (PEPCID ) 20 MG tablet TAKE 1 TABLET BY MOUTH TWICE A DAY 180 tablet 1   gabapentin  (NEURONTIN ) 300 MG capsule Take 1 capsule (300 mg total) by mouth 3 (three) times daily. 180 capsule 3   hyaluronate sodium (RADIAPLEXRX) GEL Apply topically to affected area daily as directed. 170 g 3   ibuprofen  (ADVIL ) 800 MG tablet Take 1 tablet (800 mg total) by mouth every 8 (eight) hours as needed. 90 tablet 1   omeprazole  (PRILOSEC ) 20 MG capsule TAKE 1 CAPSULE BY MOUTH EVERY OTHER DAY 45  capsule 3   ondansetron  (ZOFRAN ) 8 MG tablet Take 1 tablet (8 mg total) by mouth every 8 (eight) hours as needed for nausea or vomiting. 20 tablet 3   oxyCODONE  (OXYCONTIN ) 10 mg 12 hr tablet Take 1 tablet (10 mg total) by mouth every 12 (twelve) hours. 60 tablet 0   oxyCODONE -acetaminophen  (PERCOCET/ROXICET) 5-325 MG tablet Take 1 tablet by mouth every 4 (four) hours as needed for severe pain (pain score 7-10). 90  tablet 0   SUMAtriptan  (IMITREX ) 50 MG tablet Take 1 tablet (50 mg total) by mouth every 2 (two) hours as needed for migraine (max 2 doses in 24 hours.). May repeat in 2 hours if headache persists or recurs. 10 tablet 2   tiZANidine  (ZANAFLEX ) 2 MG tablet Take 1 tablet (2 mg total) by mouth every 8 (eight) hours as needed for muscle spasms. 30 tablet 3   VENTOLIN  HFA 108 (90 Base) MCG/ACT inhaler TAKE 2 PUFFS BY MOUTH EVERY 6 HOURS AS NEEDED FOR WHEEZE OR SHORTNESS OF BREATH 18 each 1   No current facility-administered medications for this visit.    VITAL SIGNS: There were no vitals taken for this visit. There were no vitals filed for this visit.    Estimated body mass index is 22.09 kg/m as calculated from the following:   Height as of an earlier encounter on 10/21/23: 5' 4 (1.626 m).   Weight as of an earlier encounter on 10/21/23: 128 lb 11.2 oz (58.4 kg).   PERFORMANCE STATUS (ECOG) : 1 - Symptomatic but completely ambulatory    IMPRESSION: Discussed the use of AI scribe software for clinical note transcription with the patient, who gave verbal consent to proceed.  History of Present Illness Julia [name] is a 58 year old female who I connected with by phone for symptom management follow-up. Denies concerns with nausea, vomiting, constipation, or diarrhea. Occasional fatigue. Her appetite continues to improve. Weight is up to 128lbs which she is appreciative of.   She shares recent migraine headache. This is controlled with occasional use of ibuprofen  6-800 mg.  Encouraged patient to continue to take however migraines are more persistent but most likely need further medical evaluation.  She verbalized understanding.  We discussed her pain at length.  Julia [name] reports pain is well-controlled on current regimen which includes gabapentin  300 mg 3 times a day, OxyContin  10 mg every 12 hours and Percocet 5 mg every 6 hours as needed.  Occasional use of baclofen for muscle spasms  however does not require daily.  No adjustments to current regimen at this time.  All questions answered and support provided.  We will continue to closely monitor as needed.\  I discussed the importance of continued conversation with family and their medical providers regarding overall plan of care and treatment options, ensuring decisions are within the context of the patients values and GOCs. Assessment & Plan Cancer pain Chronic cancer pain managed with gabapentin , OxyContin , and oxycodone .  Explained tapering off opioids is feasible if pain improves. - Continue gabapentin  three times daily. - Continue OxyContin  10 mg every 12 hours. - Continue oxycodone  every 4 hours as needed. -Baclofen three times daily as needed.  - Ibuprofen  800 mg every 8 hours as needed for migraine.  Follow-up I will plan to see patient back in the clinic in 3-4 weeks.   - Instruct to call if condition changes before next appointment.  Patient expressed understanding and was in agreement with this plan. She also understands that She can  call the clinic at any time with any questions, concerns, or complaints.   Any controlled substances utilized were prescribed in the context of palliative care. PDMP has been reviewed.   I provided 25 minutes of non face-to-face telephone visit time during this encounter, and > 50% was spent counseling as documented under my assessment & plan. Visit consisted of counseling and education dealing with the complex and emotionally intense issues of symptom management and palliative care in the setting of serious and potentially life-threatening illness.  Levon Borer, AGPCNP-BC  Palliative Medicine Team/[facility]

## 2023-10-21 NOTE — Progress Notes (Signed)
 Patient Care Team: Julia Arlyss RAMAN, MD as PCP - General (Family Medicine) Julia Jesusa HERO, RN as Registered Nurse Julia Jesusa HERO, RN as Registered Nurse Julia Stephane BROCKS, RN (Inactive) as Oncology Nurse Navigator Julia Nanetta SAILOR, RN as Oncology Nurse Navigator [name], MD as Consulting Physician (Radiation Oncology) Julia Potts, MD as Consulting Physician (Hematology and Oncology) Julia Deward MOULD, MD as Consulting Physician (General Surgery) Julia [name], RPH-CPP as Pharmacist (Hematology and Oncology) [name], Julia SAILOR, NP as Nurse Practitioner (Hospice and Palliative Medicine)  DIAGNOSIS:  Encounter Diagnosis  Name Primary?   Malignant neoplasm of upper-outer quadrant of left breast in female, estrogen receptor positive (HCC) Yes    SUMMARY OF ONCOLOGIC HISTORY: Oncology History  Malignant neoplasm of upper-outer quadrant of left breast in female, estrogen receptor positive (HCC)  08/25/2019 Initial Diagnosis   Palpable left breast mass x1 month. Diagnostic mammogram and US  on 08/18/19 showed a 2.8cm mass at the 2 o'clock position, and one lymph node with mild cortical thickening in the left axilla. 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   10/18/2019 Cancer Staging   Staging form: Breast, AJCC 8th Edition - Pathologic stage from 10/18/2019: Stage IB (pT2, pN0(sn), cM0, G3, ER+, PR+, HER2-)   10/21/2019 Oncotype testing   The Oncotype DX score was 32 predicting a risk of outside the breast recurrence over the next 9 years of 20% if the patient's only systemic therapy is tamoxifen for 5 years.    11/15/2019 - 03/27/2020 Chemotherapy   Adjuvant chemotherapy with dose dense  Adriamycin  and Cytoxan  x4 (11/15/2019 - 12/27/2019) followed by Taxol  weekly x12 (01/09/2020 - 03/27/20).   04/25/2020 - 05/25/2020 Radiation Therapy   The patient initially received a dose of 42.56 Gy in 16 fractions to the breast using whole-breast tangent fields. This was delivered using a 3-D conformal technique. The pt received a boost delivering an additional 8 Gy in 4 fractions using a electron boost with electrons. The total dose was 50.56 Gy.   05/2020 -  Anti-estrogen oral therapy   Anastrozole    05/06/2021 Surgery   Left mastectomy: Benign (performed because of left breast fibrosis and pain)     CHIEF COMPLIANT: Follow-up 1 Verzinio with Faslodex   HISTORY OF PRESENT ILLNESS:   History of Present Illness [name] is a 58 year old female with a history of cancer who presents for a follow-up visit regarding her biologic therapy.  She experiences persistent tiredness that significantly impacts her daily life. She is currently on a 100 mg dose of biologic therapy, which she tolerates well after previously experiencing sickness on a 150 mg dose.  Recent blood work shows normal white blood cell count, hemoglobin at 13.1, and normal platelet levels. Liver function tests, including AST and ALT, and kidney function are normal. Blood sugar is 107 (not a fasting level).  The bone shot causes discomfort in her hips for a couple of days, though the stomach injection does not cause pain. She has no issues obtaining her medication on time, as it is set up to be sent to her regular pharmacy, [facility].     ALLERGIES:  is allergic to propoxyphene n-acetaminophen , nabumetone, and rofecoxib.  MEDICATIONS:  Current Outpatient Medications  Medication Sig Dispense Refill   abemaciclib  (VERZENIO ) 100 MG tablet Take 1 tablet (100 mg total) by mouth 2 (two) times daily. 60 tablet 3   calcium -vitamin D (OSCAL WITH D) 500-5 MG-MCG tablet Take 1 tablet by mouth daily. 30 tablet 5   famotidine   (PEPCID ) 20 MG tablet TAKE 1 TABLET BY MOUTH TWICE A DAY 180 tablet 1   gabapentin  (NEURONTIN ) 300 MG capsule Take 1 capsule (300 mg total) by mouth 3 (three) times daily. 180 capsule 3   hyaluronate sodium (RADIAPLEXRX) GEL Apply topically to affected area daily as directed. 170 g 3   ibuprofen  (ADVIL ) 600 MG tablet TAKE 1 TABLET BY MOUTH EVERY 8 HOURS AS NEEDED. 90 tablet 0   omeprazole  (PRILOSEC ) 20 MG capsule TAKE 1 CAPSULE BY MOUTH EVERY OTHER DAY 45 capsule 3   ondansetron  (ZOFRAN ) 8 MG tablet Take 1 tablet (8 mg total) by mouth every 8 (eight) hours as needed for nausea or vomiting. 20 tablet 3   oxyCODONE  (OXYCONTIN ) 10 mg 12 hr tablet Take 1 tablet (10 mg total) by mouth every 12 (twelve) hours. 60 tablet 0   oxyCODONE -acetaminophen  (PERCOCET/ROXICET) 5-325 MG tablet Take 1 tablet by mouth every 4 (four) hours as needed for severe pain (pain score 7-10). 90 tablet 0   SUMAtriptan  (IMITREX ) 50 MG tablet Take 1 tablet (50 mg total) by mouth every 2 (two) hours as needed for migraine (max 2 doses in 24 hours.). May repeat in 2 hours if headache persists or recurs. 10 tablet 2   tiZANidine  (ZANAFLEX ) 2 MG tablet Take 1 tablet (2 mg total) by mouth every 8 (eight) hours as needed for muscle spasms. 30 tablet 3   VENTOLIN  HFA 108 (90 Base) MCG/ACT inhaler TAKE 2 PUFFS BY MOUTH EVERY 6 HOURS AS NEEDED FOR WHEEZE OR SHORTNESS OF BREATH 18 each 1   No current facility-administered medications for this visit.    PHYSICAL EXAMINATION: ECOG PERFORMANCE STATUS: 1 - Symptomatic but completely ambulatory  Vitals:   10/21/23 0900  BP: (!) 122/56  Pulse: 65  Resp: 18  Temp: 98.4 F (36.9 C)  SpO2: 98%   Filed Weights   10/21/23 0900  Weight: 128 lb 11.2 oz (58.4 kg)    Physical Exam VITALS: BP- 130/80  (exam performed in the presence of a chaperone)  LABORATORY DATA:  I have reviewed the data as listed    Latest Ref Rng & Units 10/21/2023    8:44 AM 07/29/2023    8:46 AM 07/27/2023    11:48 AM  CMP  Glucose 70 - 99 mg/dL 892  97  895   BUN 6 - 20 mg/dL 9  9  10    Creatinine 0.44 - 1.00 mg/dL 9.17  9.36  9.36   Sodium 135 - 145 mmol/L 138  139  139   Potassium 3.5 - 5.1 mmol/L 4.2  3.9  3.8   Chloride 98 - 111 mmol/L 106  109  107   CO2 22 - 32 mmol/L 26  27  26    Calcium  8.9 - 10.3 mg/dL 9.1  8.3  8.9   Total Protein 6.5 - 8.1 g/dL 6.7  6.3  6.9   Total Bilirubin 0.0 - 1.2 mg/dL 0.3  0.3  0.6   Alkaline Phos 38 - 126 U/L 40  41  42   AST 15 - 41 U/L 9  9  10    ALT 0 - 44 U/L 6  <5  6  Lab Results  Component Value Date   WBC 5.6 10/21/2023   HGB 13.1 10/21/2023   HCT 36.6 10/21/2023   MCV 98.7 10/21/2023   PLT 177 10/21/2023   NEUTROABS 3.4 10/21/2023    ASSESSMENT & PLAN:  Malignant neoplasm of upper-outer quadrant of left breast in female, estrogen receptor positive (HCC) 10/07/2019:Left lumpectomy and sentinel lymph node biopsy [facility]): IDC with DCIS, 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  ER 90%, PR 90%, HER-2 negative by FISH Posterior margin positive but no further surgery done because it is muscle on the back. T2N0 stage Ib Oncotype DX score 32: 20% risk of distant recurrence in 9 years   Treatment plan:  1. Adjuvant chemotherapy with dose dense Adriamycin  and Cytoxan  x4 followed by Taxol  weekly x12 completed 03/27/21 2. followed by radiation therapy started 04/26/20 3.  Followed by adjuvant antiestrogen therapy.  Started February 2022 ------------------------------------------------------------------------------------------------------------   Right neck swelling CT neck 11/12/2022: 9 mm level 5 lymph node, biopsy: Metastatic carcinoma consistent with breast primary, ER 95%, PR 80%, HER2 0-1+   Recommendation: PET CT scan 01/30/2023: 10 mm cervical LN, scattered pulm nodules below level of PET, Multiple bone mets Current treatment: Verzinio along with Faslodex  started 02/04/2023   Abemaciclib  toxicities:  Fatigue which is  preventing her from staying as active as she used to be.    GI toxicities: Abdominal pain, nausea vomiting and diarrhea: Resolved after stopping Verzenio  I reduced the dosage of Verzinio to 100 mg p.o. twice daily and sent a new prescription. She will start taking this as soon as the medicine arrives.   Bone metastasis: On Xgeva  along with calcium  and vitamin D CT CAP 04/30/23: Bone mets more sclerotic (suggestive of response), lung nodules stable. CT abdomen pelvis 07/27/2023: Bone metastases stable Recommend scans every 6 months. Return to clinic in 3 months for CT CAP and follow-up   ------------------------------------- Assessment and Plan Assessment & Plan Malignant neoplasm of upper-outer quadrant of left breast, estrogen receptor positive On biologic therapy with Abemaciclib , Anastrozole , and Faslodex . Blood work normal. Liver and kidney functions normal. Disease well-managed on current regimen. Mammograms unnecessary due to metastasis; whole body scans used for monitoring. - Order whole body CT scan before next visit in three months. - Continue Anastrozole , Xgeva , Calcium , Vitamin D, Verzinio 100 mg oral bid, and Faslodex .  Abemaciclib  toxicities Previous toxicities at 150 mg dose. No diarrhea, normal blood work. Fatigue persists, common side effect. Encouraged physical activity to prevent weakness. - Continue Abemaciclib  at 100 mg oral bid.      No orders of the defined types were placed in this encounter.  The patient has a good understanding of the overall plan. she agrees with it. she will call with any problems that may develop before the next visit here. Total time spent: 30 mins including face to face time and time spent for planning, charting and co-ordination of care   Viinay K Remell Giaimo, MD 10/21/23

## 2023-10-22 ENCOUNTER — Telehealth: Payer: Self-pay | Admitting: Nurse Practitioner

## 2023-10-22 ENCOUNTER — Other Ambulatory Visit: Payer: Medicaid Other

## 2023-10-22 ENCOUNTER — Ambulatory Visit: Payer: Medicaid Other

## 2023-10-22 ENCOUNTER — Ambulatory Visit: Payer: Medicaid Other | Admitting: Hematology and Oncology

## 2023-10-22 LAB — CANCER ANTIGEN 27.29: CA 27.29: 82.7 U/mL — ABNORMAL HIGH (ref 0.0–38.6)

## 2023-10-22 NOTE — Telephone Encounter (Signed)
 Spoke with patient confirming upcoming appointment

## 2023-10-23 ENCOUNTER — Encounter: Payer: Self-pay | Admitting: Advanced Practice Midwife

## 2023-11-03 ENCOUNTER — Telehealth: Payer: Self-pay | Admitting: Hematology and Oncology

## 2023-11-03 NOTE — Telephone Encounter (Signed)
 Rescheduled appointments per the patients request. She is aware and confirmed the changes made.

## 2023-11-04 ENCOUNTER — Other Ambulatory Visit: Payer: Self-pay

## 2023-11-04 DIAGNOSIS — G893 Neoplasm related pain (acute) (chronic): Secondary | ICD-10-CM

## 2023-11-04 DIAGNOSIS — C50412 Malignant neoplasm of upper-outer quadrant of left female breast: Secondary | ICD-10-CM

## 2023-11-04 DIAGNOSIS — C50919 Malignant neoplasm of unspecified site of unspecified female breast: Secondary | ICD-10-CM

## 2023-11-04 DIAGNOSIS — Z515 Encounter for palliative care: Secondary | ICD-10-CM

## 2023-11-04 MED ORDER — OXYCODONE HCL ER 10 MG PO T12A: 10.0000 mg | EXTENDED_RELEASE_TABLET | Freq: Two times a day (BID) | ORAL | 0 refills | Status: DC

## 2023-11-04 MED ORDER — OXYCODONE-ACETAMINOPHEN 5-325 MG PO TABS
1.0000 | ORAL_TABLET | ORAL | 0 refills | Status: DC | PRN
Start: 2023-11-04 — End: 2023-11-25

## 2023-11-04 NOTE — Telephone Encounter (Signed)
 Pt called for refill, see associated orders.

## 2023-11-06 ENCOUNTER — Other Ambulatory Visit: Payer: Self-pay | Admitting: Hematology and Oncology

## 2023-11-16 ENCOUNTER — Other Ambulatory Visit: Payer: Self-pay | Admitting: Family Medicine

## 2023-11-16 ENCOUNTER — Other Ambulatory Visit: Payer: Self-pay | Admitting: Hematology and Oncology

## 2023-11-25 ENCOUNTER — Inpatient Hospital Stay

## 2023-11-25 ENCOUNTER — Other Ambulatory Visit: Payer: Self-pay

## 2023-11-25 DIAGNOSIS — Z515 Encounter for palliative care: Secondary | ICD-10-CM

## 2023-11-25 DIAGNOSIS — C50919 Malignant neoplasm of unspecified site of unspecified female breast: Secondary | ICD-10-CM

## 2023-11-25 DIAGNOSIS — G893 Neoplasm related pain (acute) (chronic): Secondary | ICD-10-CM

## 2023-11-25 DIAGNOSIS — Z170 Estrogen receptor positive status [ER+]: Secondary | ICD-10-CM

## 2023-11-25 MED ORDER — OXYCODONE-ACETAMINOPHEN 5-325 MG PO TABS: 1.0000 | ORAL_TABLET | ORAL | 0 refills | Status: DC | PRN

## 2023-11-25 MED ORDER — TIZANIDINE HCL 2 MG PO TABS: 2.0000 mg | ORAL_TABLET | Freq: Three times a day (TID) | ORAL | 3 refills | Status: AC | PRN

## 2023-11-25 NOTE — Telephone Encounter (Signed)
Pt called for medication refill, see orders

## 2023-11-26 ENCOUNTER — Inpatient Hospital Stay: Attending: Hematology and Oncology | Admitting: Nurse Practitioner

## 2023-11-26 ENCOUNTER — Other Ambulatory Visit (HOSPITAL_COMMUNITY): Payer: Self-pay

## 2023-11-26 ENCOUNTER — Inpatient Hospital Stay

## 2023-11-26 ENCOUNTER — Encounter: Payer: Self-pay | Admitting: Nurse Practitioner

## 2023-11-26 VITALS — BP 108/48 | HR 78 | Temp 97.6°F | Resp 17 | Ht 64.0 in | Wt 132.1 lb

## 2023-11-26 DIAGNOSIS — R530 Neoplastic (malignant) related fatigue: Secondary | ICD-10-CM

## 2023-11-26 DIAGNOSIS — Z170 Estrogen receptor positive status [ER+]: Secondary | ICD-10-CM | POA: Insufficient documentation

## 2023-11-26 DIAGNOSIS — G4709 Other insomnia: Secondary | ICD-10-CM | POA: Diagnosis not present

## 2023-11-26 DIAGNOSIS — K5903 Drug induced constipation: Secondary | ICD-10-CM | POA: Diagnosis not present

## 2023-11-26 DIAGNOSIS — Z79899 Other long term (current) drug therapy: Secondary | ICD-10-CM | POA: Diagnosis not present

## 2023-11-26 DIAGNOSIS — C50919 Malignant neoplasm of unspecified site of unspecified female breast: Secondary | ICD-10-CM

## 2023-11-26 DIAGNOSIS — G893 Neoplasm related pain (acute) (chronic): Secondary | ICD-10-CM | POA: Diagnosis not present

## 2023-11-26 DIAGNOSIS — C7951 Secondary malignant neoplasm of bone: Secondary | ICD-10-CM | POA: Insufficient documentation

## 2023-11-26 DIAGNOSIS — C50412 Malignant neoplasm of upper-outer quadrant of left female breast: Secondary | ICD-10-CM | POA: Insufficient documentation

## 2023-11-26 DIAGNOSIS — Z515 Encounter for palliative care: Secondary | ICD-10-CM

## 2023-11-26 DIAGNOSIS — Z5111 Encounter for antineoplastic chemotherapy: Secondary | ICD-10-CM | POA: Insufficient documentation

## 2023-11-26 MED ORDER — OXYCODONE HCL ER 10 MG PO T12A
10.0000 mg | EXTENDED_RELEASE_TABLET | Freq: Two times a day (BID) | ORAL | 0 refills | Status: DC
  Filled 2023-11-26: qty 60, 30d supply, fill #0

## 2023-11-26 MED ORDER — FULVESTRANT 250 MG/5ML IM SOSY
500.0000 mg | PREFILLED_SYRINGE | Freq: Once | INTRAMUSCULAR | Status: AC
  Administered 2023-11-26: 500 mg via INTRAMUSCULAR
  Filled 2023-11-26: qty 10

## 2023-11-26 MED ORDER — POLYETHYLENE GLYCOL 3350 17 GM/SCOOP PO POWD
17.0000 g | Freq: Every day | ORAL | 0 refills | Status: AC
  Filled 2023-11-26: qty 476, 28d supply, fill #0

## 2023-11-26 NOTE — Progress Notes (Signed)
 Palliative Medicine [facility]  [phone]) [phone] Fax:(336) [phone]   Name: Julia [name] Date: 11/26/2023 MRN: [id number]  DOB: Sep 05, 1965  Patient Care Team: [name], MD as PCP - General (Family Medicine) [name], RN as Registered Nurse [name], RN as Registered Nurse [name], RN (Inactive) as Oncology Nurse Navigator [name], RN as Oncology Nurse Navigator [name], MD as Consulting Physician (Radiation Oncology) [name], MD as Consulting Physician (Hematology and Oncology) [name], MD as Consulting Physician (General Surgery) [name], RPH-CPP as Pharmacist (Hematology and Oncology) [name], [name], NP as Nurse Practitioner [facility] and Palliative Medicine)   I connected with Julia [name] on 11/26/23 at  9:30 AM EDT by phone and verified that I am speaking with the correct person using two identifiers.   I discussed the limitations, risks, security and privacy concerns of performing an evaluation and management service by telemedicine and the availability of in-person appointments. I also discussed with the patient that there may be a patient responsible charge related to this service. The patient expressed understanding and agreed to proceed.   Other persons participating in the visit and their role in the encounter: N/A   Patient's location: Home  Provider's location: [facility]   INTERVAL HISTORY: Julia [name] is a 58 y.o. 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 Palliative asked to see for symptom and pain management and goals of care.   SOCIAL HISTORY:     reports that she has been smoking cigarettes. She has a 62 pack-year smoking history. She has never used smokeless tobacco. She reports that she does not  drink alcohol and does not use drugs.  ADVANCE DIRECTIVES:  None on file   CODE STATUS: Full code  PAST MEDICAL HISTORY: Past Medical History:  Diagnosis Date   Acute bronchitis 04/19/2021   Anxiety    over cancer diagnosis   Asthma    bronchial asthma    Cancer (HCC) 09/2019   left breast invasive mammary cancer   Chronic back pain    COPD (chronic obstructive pulmonary disease) (HCC)    smokes 1 1/2 ppd   Diabetes mellitus without complication (HCC)    GERD (gastroesophageal reflux disease)    Personal history of chemotherapy    Personal history of radiation therapy    Pneumonia    hx of 2018    Smoking     ALLERGIES:  is allergic to propoxyphene n-acetaminophen , nabumetone, and rofecoxib.  MEDICATIONS:  Current Outpatient Medications  Medication Sig Dispense Refill   polyethylene glycol powder (MIRALAX ) 17 GM/SCOOP powder Mix 17 g in 4 oz of liquid and take by mouth daily. 476 g 0   anastrozole  (ARIMIDEX ) 1 MG tablet TAKE 1 TABLET BY MOUTH EVERY DAY 90 tablet 3   calcium -vitamin D (OSCAL WITH D) 500-5 MG-MCG tablet Take 1 tablet by mouth daily. 30 tablet 5   famotidine  (PEPCID ) 20 MG tablet TAKE 1 TABLET BY MOUTH TWICE A DAY 180 tablet 1   gabapentin  (NEURONTIN ) 300 MG capsule Take 1 capsule (300 mg total) by mouth 3 (three) times daily. 180 capsule 3   hyaluronate sodium (RADIAPLEXRX) GEL Apply topically to affected area daily as directed. 170 g 3   ibuprofen  (ADVIL ) 800 MG tablet Take 1 tablet (800 mg total) by mouth every 8 (eight) hours as  needed. 90 tablet 1   omeprazole  (PRILOSEC ) 20 MG capsule TAKE 1 CAPSULE BY MOUTH EVERY OTHER DAY 45 capsule 3   ondansetron  (ZOFRAN ) 8 MG tablet Take 1 tablet (8 mg total) by mouth every 8 (eight) hours as needed for nausea or vomiting. 20 tablet 3   oxyCODONE  (OXYCONTIN ) 10 mg 12 hr tablet Take 1 tablet (10 mg total) by mouth every 12 (twelve) hours. 60 tablet 0   oxyCODONE -acetaminophen  (PERCOCET/ROXICET) 5-325 MG tablet Take 1  tablet by mouth every 4 (four) hours as needed for severe pain (pain score 7-10). 90 tablet 0   SUMAtriptan  (IMITREX ) 50 MG tablet Take 1 tablet (50 mg total) by mouth every 2 (two) hours as needed for migraine (max 2 doses in 24 hours.). May repeat in 2 hours if headache persists or recurs. 10 tablet 2   tiZANidine  (ZANAFLEX ) 2 MG tablet Take 1 tablet (2 mg total) by mouth every 8 (eight) hours as needed for muscle spasms. 30 tablet 3   VENTOLIN  HFA 108 (90 Base) MCG/ACT inhaler TAKE 2 PUFFS BY MOUTH EVERY 6 HOURS AS NEEDED FOR WHEEZE OR SHORTNESS OF BREATH 18 each 1   VERZENIO  100 MG tablet TAKE 1 TABLET BY MOUTH 2 TIMES A DAY 56 tablet 3   No current facility-administered medications for this visit.    VITAL SIGNS: BP (!) 108/48 (BP Location: Left Arm, Patient Position: Sitting) Comment: Recheck  Pulse 78   Temp 97.6 F (36.4 C) (Temporal)   Resp 17   Ht 5' 4 (1.626 m)   Wt 132 lb 2 oz (59.9 kg)   SpO2 100%   BMI 22.68 kg/m  Filed Weights   11/26/23 0927  Weight: 132 lb 2 oz (59.9 kg)      Estimated body mass index is 22.68 kg/m as calculated from the following:   Height as of this encounter: 5' 4 (1.626 m).   Weight as of this encounter: 132 lb 2 oz (59.9 kg).   PERFORMANCE STATUS (ECOG) : 1 - Symptomatic but completely ambulatory    IMPRESSION: Discussed the use of AI scribe software for clinical note transcription with the patient, who gave verbal consent to proceed.  History of Present Illness Julia [name] is a 58 year old female who presented to clinic for symptom management follow-up. Denies concerns with nausea, vomiting, constipation, or diarrhea. Occasional fatigue. Her appetite continues to improve. Her appetite is stable, and she has gained weight, currently weighing 132 pounds, up from 128 pounds on July 16.  We discussed her pain at length.  Keyosha reports pain is well-controlled on current regimen which includes gabapentin  300 mg 3 times a day,  OxyContin  10 mg every 12 hours and Percocet 5 mg every 6 hours as needed.  Unfortunately over the past week she has been without medications due to supply shortage with pharmacy. Plans to pick up Oxycontin  today at [facility] and resume.  No adjustments to current regimen at this time.  All questions answered and support provided.  We will continue to closely monitor as needed.\  I discussed the importance of continued conversation with family and their medical providers regarding overall plan of care and treatment options, ensuring decisions are within the context of the patients values and GOCs. Assessment & Plan Cancer pain Chronic cancer pain managed with gabapentin , OxyContin , and oxycodone .  Explained tapering off opioids is feasible if pain improves. - Continue gabapentin  three times daily. - Continue OxyContin  10 mg every 12 hours. -  Continue oxycodone  every 4 hours as needed. -Baclofen three times daily as needed.  - Ibuprofen  800 mg every 8 hours as needed for migraine.  Constipation She is not currently using stool softeners or laxatives. - Recommend Miralax  powder, mix in water or juice, and take as needed. - If Miralax  is ineffective, recommend Senokot, two tablets at bedtime. - Check office for Miralax  samples.  Insomnia Insomnia is reported and is not related to recent medication issues. She has not been sleeping well and has not tried any sleep aids yet. - Recommend melatonin, take 30-45 minutes before bedtime.  Follow-up I will plan to see patient back in the clinic in 3-4 weeks.   - Instruct to call if condition changes before next appointment.  Patient expressed understanding and was in agreement with this plan. She also understands that She can call the clinic at any time with any questions, concerns, or complaints.   Any controlled substances utilized were prescribed in the context of palliative care. PDMP has been reviewed.   Visit consisted of  counseling and education dealing with the complex and emotionally intense issues of symptom management and palliative care in the setting of serious and potentially life-threatening illness.  Levon Borer, AGPCNP-BC  Palliative Medicine Team/[facility]

## 2023-12-10 ENCOUNTER — Other Ambulatory Visit: Payer: Self-pay

## 2023-12-10 DIAGNOSIS — Z515 Encounter for palliative care: Secondary | ICD-10-CM

## 2023-12-10 DIAGNOSIS — Z170 Estrogen receptor positive status [ER+]: Secondary | ICD-10-CM

## 2023-12-10 DIAGNOSIS — G893 Neoplasm related pain (acute) (chronic): Secondary | ICD-10-CM

## 2023-12-10 DIAGNOSIS — C50919 Malignant neoplasm of unspecified site of unspecified female breast: Secondary | ICD-10-CM

## 2023-12-10 MED ORDER — OXYCODONE-ACETAMINOPHEN 5-325 MG PO TABS
1.0000 | ORAL_TABLET | ORAL | 0 refills | Status: DC | PRN
Start: 2023-12-10 — End: 2023-12-24

## 2023-12-10 NOTE — Telephone Encounter (Signed)
 Pt called and LVM for refill of medication, attempted to call back, no answer, LVM and callback number, see associated orders.

## 2023-12-15 ENCOUNTER — Telehealth: Payer: Self-pay

## 2023-12-15 NOTE — Telephone Encounter (Signed)
 Pt called and LVM reporting new symptom of urinary retention. Attempted to call pt back, no answer, LVM.

## 2023-12-16 ENCOUNTER — Other Ambulatory Visit: Payer: Self-pay

## 2023-12-16 DIAGNOSIS — R350 Frequency of micturition: Secondary | ICD-10-CM

## 2023-12-16 DIAGNOSIS — R8290 Unspecified abnormal findings in urine: Secondary | ICD-10-CM

## 2023-12-16 DIAGNOSIS — Z515 Encounter for palliative care: Secondary | ICD-10-CM

## 2023-12-16 NOTE — Progress Notes (Signed)
 Received call from pt stating she was having urine retention, urine malodor, and urine frequency, and mild pain with urination. Pt instructed to come give a urine sample to rule out UTI. Pt reports she is only available tomorrow 12/17/2023. Appt made and ordered entered. No further needs at this time.

## 2023-12-17 ENCOUNTER — Inpatient Hospital Stay: Attending: Hematology and Oncology | Admitting: Nurse Practitioner

## 2023-12-17 ENCOUNTER — Telehealth: Payer: Self-pay

## 2023-12-17 ENCOUNTER — Other Ambulatory Visit: Payer: Self-pay | Admitting: Hematology and Oncology

## 2023-12-17 DIAGNOSIS — C50919 Malignant neoplasm of unspecified site of unspecified female breast: Secondary | ICD-10-CM

## 2023-12-17 DIAGNOSIS — Z170 Estrogen receptor positive status [ER+]: Secondary | ICD-10-CM | POA: Diagnosis not present

## 2023-12-17 DIAGNOSIS — Z5111 Encounter for antineoplastic chemotherapy: Secondary | ICD-10-CM | POA: Insufficient documentation

## 2023-12-17 DIAGNOSIS — R8290 Unspecified abnormal findings in urine: Secondary | ICD-10-CM | POA: Insufficient documentation

## 2023-12-17 DIAGNOSIS — Z515 Encounter for palliative care: Secondary | ICD-10-CM | POA: Insufficient documentation

## 2023-12-17 DIAGNOSIS — R350 Frequency of micturition: Secondary | ICD-10-CM | POA: Insufficient documentation

## 2023-12-17 DIAGNOSIS — F17210 Nicotine dependence, cigarettes, uncomplicated: Secondary | ICD-10-CM | POA: Diagnosis not present

## 2023-12-17 DIAGNOSIS — C7951 Secondary malignant neoplasm of bone: Secondary | ICD-10-CM | POA: Insufficient documentation

## 2023-12-17 DIAGNOSIS — C50412 Malignant neoplasm of upper-outer quadrant of left female breast: Secondary | ICD-10-CM | POA: Diagnosis not present

## 2023-12-17 LAB — URINALYSIS, COMPLETE (UACMP) WITH MICROSCOPIC
Bilirubin Urine: NEGATIVE
Glucose, UA: NEGATIVE mg/dL
Ketones, ur: NEGATIVE mg/dL
Nitrite: POSITIVE — AB
Protein, ur: NEGATIVE mg/dL
Specific Gravity, Urine: 1.005 (ref 1.005–1.030)
WBC, UA: >50 WBC/hpf (ref 0–5)
pH: 7 (ref 5.0–8.0)

## 2023-12-17 MED ORDER — SULFAMETHOXAZOLE-TRIMETHOPRIM 800-160 MG PO TABS: 1.0000 | ORAL_TABLET | Freq: Two times a day (BID) | ORAL | 0 refills | Status: AC

## 2023-12-17 NOTE — Telephone Encounter (Signed)
 Pt came in today for a lab visit d/t urinary symptoms. Pt urine shows UTI, abx called in by Levon, NP. Called pt to inform of labs and new medication, all questions answered, no further needs at this time.

## 2023-12-19 LAB — URINE CULTURE: Culture: >=100000 — AB

## 2023-12-23 ENCOUNTER — Encounter: Payer: Self-pay | Admitting: Hematology and Oncology

## 2023-12-23 ENCOUNTER — Inpatient Hospital Stay

## 2023-12-24 ENCOUNTER — Encounter: Payer: Self-pay | Admitting: Nurse Practitioner

## 2023-12-24 ENCOUNTER — Encounter: Payer: Self-pay | Admitting: Licensed Clinical Social Worker

## 2023-12-24 ENCOUNTER — Inpatient Hospital Stay (HOSPITAL_BASED_OUTPATIENT_CLINIC_OR_DEPARTMENT_OTHER): Admitting: Nurse Practitioner

## 2023-12-24 ENCOUNTER — Inpatient Hospital Stay

## 2023-12-24 VITALS — BP 125/66 | HR 86 | Temp 98.3°F | Resp 15 | Wt 126.2 lb

## 2023-12-24 DIAGNOSIS — Z170 Estrogen receptor positive status [ER+]: Secondary | ICD-10-CM

## 2023-12-24 DIAGNOSIS — C50919 Malignant neoplasm of unspecified site of unspecified female breast: Secondary | ICD-10-CM | POA: Diagnosis not present

## 2023-12-24 DIAGNOSIS — Z5111 Encounter for antineoplastic chemotherapy: Secondary | ICD-10-CM | POA: Diagnosis not present

## 2023-12-24 DIAGNOSIS — Z515 Encounter for palliative care: Secondary | ICD-10-CM | POA: Diagnosis not present

## 2023-12-24 DIAGNOSIS — C50412 Malignant neoplasm of upper-outer quadrant of left female breast: Secondary | ICD-10-CM

## 2023-12-24 DIAGNOSIS — G893 Neoplasm related pain (acute) (chronic): Secondary | ICD-10-CM | POA: Diagnosis not present

## 2023-12-24 DIAGNOSIS — C7951 Secondary malignant neoplasm of bone: Secondary | ICD-10-CM

## 2023-12-24 DIAGNOSIS — R530 Neoplastic (malignant) related fatigue: Secondary | ICD-10-CM

## 2023-12-24 MED ORDER — FULVESTRANT 250 MG/5ML IM SOSY
500.0000 mg | PREFILLED_SYRINGE | Freq: Once | INTRAMUSCULAR | Status: AC
  Administered 2023-12-24: 500 mg via INTRAMUSCULAR
  Filled 2023-12-24: qty 10

## 2023-12-24 MED ORDER — OXYCODONE-ACETAMINOPHEN 7.5-325 MG PO TABS: 1.0000 | ORAL_TABLET | ORAL | 0 refills | Status: DC | PRN

## 2023-12-24 MED ORDER — OXYCODONE HCL ER 10 MG PO T12A: 10.0000 mg | EXTENDED_RELEASE_TABLET | Freq: Two times a day (BID) | ORAL | 0 refills | Status: DC

## 2023-12-24 NOTE — Progress Notes (Signed)
 Palliative Medicine [facility]  Telephone:(336) [phone] Fax:(336) [phone]   Name: Julia [name] Date: 12/24/2023 MRN: [id number]  DOB: 1965/12/06  Patient Care Team: Julia Arlyss GORMAN, MD as PCP - General (Family Medicine) Julia Jesusa HERO, RN as Registered Nurse Julia Jesusa HERO, RN as Registered Nurse Julia Nanetta SAILOR, RN as Oncology Nurse Navigator Julia Rush, MD as Consulting Physician (Radiation Oncology) Julia Potts, MD as Consulting Physician (Hematology and Oncology) Julia Deward MOULD, MD as Consulting Physician (General Surgery) Julia [name], RPH-CPP as Pharmacist (Hematology and Oncology) Julia [name], Julia SAILOR, NP as Nurse Practitioner (Hospice and Palliative Medicine)   INTERVAL HISTORY: Julia [name] is a 58 y.o. 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 Palliative asked to see for symptom and pain management and goals of care.   SOCIAL HISTORY:     reports that she has been smoking cigarettes. She has a 62 pack-year smoking history. She has never used smokeless tobacco. She reports that she does not drink alcohol and does not use drugs.  ADVANCE DIRECTIVES:  None on file   CODE STATUS: Full code  PAST MEDICAL HISTORY: Past Medical History:  Diagnosis Date   Acute bronchitis 04/19/2021   Anxiety    over cancer diagnosis   Asthma    bronchial asthma    Cancer (HCC) 09/2019   left breast invasive mammary cancer   Chronic back pain    COPD (chronic obstructive pulmonary disease) (HCC)    smokes 1 1/2 ppd   Diabetes mellitus without complication (HCC)    GERD (gastroesophageal reflux disease)    Personal history of chemotherapy    Personal history of radiation therapy    Pneumonia    hx of 2018    Smoking     ALLERGIES:  is allergic to propoxyphene  n-acetaminophen , nabumetone, and rofecoxib.  MEDICATIONS:  Current Outpatient Medications  Medication Sig Dispense Refill   oxyCODONE -acetaminophen  (PERCOCET) 7.5-325 MG tablet Take 1 tablet by mouth every 4 (four) hours as needed for severe pain (pain score 7-10). 90 tablet 0   anastrozole  (ARIMIDEX ) 1 MG tablet TAKE 1 TABLET BY MOUTH EVERY DAY 90 tablet 3   calcium -vitamin D (OSCAL WITH D) 500-5 MG-MCG tablet Take 1 tablet by mouth daily. 30 tablet 5   famotidine  (PEPCID ) 20 MG tablet TAKE 1 TABLET BY MOUTH TWICE A DAY 180 tablet 1   gabapentin  (NEURONTIN ) 300 MG capsule Take 1 capsule (300 mg total) by mouth 3 (three) times daily. 180 capsule 3   hyaluronate sodium (RADIAPLEXRX) GEL Apply topically to affected area daily as directed. 170 g 3   ibuprofen  (ADVIL ) 800 MG tablet Take 1 tablet (800 mg total) by mouth every 8 (eight) hours as needed. 90 tablet 1   omeprazole  (PRILOSEC ) 20 MG capsule TAKE 1 CAPSULE BY MOUTH EVERY OTHER DAY 45 capsule 3   ondansetron  (ZOFRAN ) 8 MG tablet TAKE 1 TABLET BY MOUTH EVERY 8 HOURS AS NEEDED FOR NAUSEA OR VOMITING. 20 tablet 3   oxyCODONE  (OXYCONTIN ) 10 mg 12 hr tablet Take 1 tablet (10 mg total) by mouth every 12 (twelve) hours. 60 tablet 0   polyethylene glycol powder (MIRALAX ) 17 GM/SCOOP powder Mix 17 g in 4 oz of liquid and take by mouth daily. 476 g 0   sulfamethoxazole -trimethoprim  (BACTRIM  DS) 800-160 MG tablet Take 1 tablet by mouth 2 (two) times daily for  7 days. 14 tablet 0   SUMAtriptan  (IMITREX ) 50 MG tablet Take 1 tablet (50 mg total) by mouth every 2 (two) hours as needed for migraine (max 2 doses in 24 hours.). May repeat in 2 hours if headache persists or recurs. 10 tablet 2   tiZANidine  (ZANAFLEX ) 2 MG tablet Take 1 tablet (2 mg total) by mouth every 8 (eight) hours as needed for muscle spasms. 30 tablet 3   VENTOLIN  HFA 108 (90 Base) MCG/ACT inhaler TAKE 2 PUFFS BY MOUTH EVERY 6 HOURS AS NEEDED FOR WHEEZE OR SHORTNESS OF BREATH 18 each 1    VERZENIO  100 MG tablet TAKE 1 TABLET BY MOUTH 2 TIMES A DAY 56 tablet 3   No current facility-administered medications for this visit.    VITAL SIGNS: BP 125/66 (BP Location: Left Arm, Patient Position: Sitting)   Pulse 86   Temp 98.3 F (36.8 C) (Temporal)   Resp 15   Wt 126 lb 3.2 oz (57.2 kg)   SpO2 100%   BMI 21.66 kg/m  Filed Weights   12/24/23 0817  Weight: 126 lb 3.2 oz (57.2 kg)      Estimated body mass index is 21.66 kg/m as calculated from the following:   Height as of 11/26/23: 5' 4 (1.626 m).   Weight as of this encounter: 126 lb 3.2 oz (57.2 kg).   PERFORMANCE STATUS (ECOG) : 1 - Symptomatic but completely ambulatory  Assessment NAD RRR Normal breathing pattern AAO x3   IMPRESSION: Discussed the use of AI scribe software for clinical note transcription with the patient, who gave verbal consent to proceed.  History of Present Illness Julia [name] is a 58 year old female who presented to clinic for symptom management follow-up. Denies concerns with nausea, vomiting, or diarrhea. Occasional fatigue. Her appetite continues to improve. Her appetite fluctuates. Some days better than others. Currently weighing 126 pounds.   She mentions a recent positive change in her social life, as her husband gifted her a new car for their wedding anniversary, allowing her more freedom to leave the house. Previously, she only left the house for medical appointments or grocery shopping.  We discussed her pain at length.  [name] reports pain does not seem to be as controlled as previously with current regimen. Patient is emotional expressing daily pain which sometimes leaves her in the bed. Her regimen includes gabapentin  300 mg 3 times a day, OxyContin  10 mg every 12 hours and Percocet 5 mg every 6 hours as needed.  Patient will continue on current regimen with increased dose of Percocet to 7.5mg .   She attributes her constipation to her medication regimen, which has led to  hemorrhoids. She is using over-the-counter treatments for hemorrhoids and takes Miralax  daily as a stool softener. Education provided on bowel regimen in the setting of opioid use. Patient recommended to include Senna-S 2 tablets at bedtime to her regimen. If no improvement increase to 2 tablets twice daily.   All questions answered and support provided.   I discussed the importance of continued conversation with family and their medical providers regarding overall plan of care and treatment options, ensuring decisions are within the context of the patients values and GOCs. Assessment & Plan Cancer pain Current oxycodone  5 mg/325 mg is insufficient for pain relief. Dose adjustment required due to insurance constraints. - Increase oxycodone  to 7.5 mg/325 mg, one tablet as previously instructed Continue OxyContin  10 mg every 12 hours. - Continue oxycodone  every 4 hours as needed. -Baclofen  three times daily as needed.  - Ibuprofen  800 mg every 8 hours as needed for migraine.  Constipation with associated hemorrhoids Constipation managed with Miralax  requires additional intervention due to hemorrhoids. - Continue Miralax  daily - Add Senna tablets, two at bedtime - If constipation persists, increase Senna to two tablets in the morning and two in the evening - Advise purchase of Senna tablets from [facility] pharmacy for cost efficiency  Follow-up I will plan to see patient back in the clinic in 3-4 weeks.   - Instruct to call if condition changes before next appointment.   Patient expressed understanding and was in agreement with this plan. She also understands that She can call the clinic at any time with any questions, concerns, or complaints.   Any controlled substances utilized were prescribed in the context of palliative care. PDMP has been reviewed.   Visit consisted of counseling and education dealing with the complex and emotionally intense issues of symptom management and  palliative care in the setting of serious and potentially life-threatening illness.  Levon Borer, AGPCNP-BC  Palliative Medicine Team/[facility]

## 2023-12-24 NOTE — Progress Notes (Signed)
 [facility] Social Work  CSW provided contact for [employer] Jeryl) to palliative NP after pt asked about update on her disability application.  CSW also sent a message to Katheryn asking her to contact patient regarding status.   Marzella Miracle E Shalisa Mcquade, LCSW

## 2023-12-30 ENCOUNTER — Ambulatory Visit (HOSPITAL_COMMUNITY)
Admission: RE | Admit: 2023-12-30 | Discharge: 2023-12-30 | Disposition: A | Discharge status: Home / Self Care | Source: Ambulatory Visit | Attending: Hematology and Oncology | Admitting: Hematology and Oncology

## 2023-12-30 DIAGNOSIS — Z170 Estrogen receptor positive status [ER+]: Secondary | ICD-10-CM | POA: Diagnosis not present

## 2023-12-30 DIAGNOSIS — C50412 Malignant neoplasm of upper-outer quadrant of left female breast: Secondary | ICD-10-CM | POA: Insufficient documentation

## 2023-12-30 DIAGNOSIS — C50919 Malignant neoplasm of unspecified site of unspecified female breast: Secondary | ICD-10-CM | POA: Diagnosis not present

## 2023-12-30 MED ORDER — IOHEXOL 300 MG/ML  SOLN
100.0000 mL | Freq: Once | INTRAMUSCULAR | Status: AC | PRN
  Administered 2023-12-30: 100 mL via INTRAVENOUS

## 2024-01-13 ENCOUNTER — Other Ambulatory Visit: Payer: Self-pay

## 2024-01-13 DIAGNOSIS — C50919 Malignant neoplasm of unspecified site of unspecified female breast: Secondary | ICD-10-CM

## 2024-01-13 DIAGNOSIS — Z515 Encounter for palliative care: Secondary | ICD-10-CM

## 2024-01-13 DIAGNOSIS — C50412 Malignant neoplasm of upper-outer quadrant of left female breast: Secondary | ICD-10-CM

## 2024-01-13 DIAGNOSIS — G893 Neoplasm related pain (acute) (chronic): Secondary | ICD-10-CM

## 2024-01-13 MED ORDER — OXYCODONE-ACETAMINOPHEN 7.5-325 MG PO TABS: 1.0000 | ORAL_TABLET | ORAL | 0 refills | Status: DC | PRN

## 2024-01-14 ENCOUNTER — Ambulatory Visit: Admitting: Hematology and Oncology

## 2024-01-14 ENCOUNTER — Other Ambulatory Visit

## 2024-01-14 ENCOUNTER — Ambulatory Visit

## 2024-01-14 ENCOUNTER — Other Ambulatory Visit: Payer: Medicaid Other

## 2024-01-14 ENCOUNTER — Ambulatory Visit: Payer: Medicaid Other

## 2024-01-18 ENCOUNTER — Telehealth: Payer: Self-pay

## 2024-01-18 NOTE — Telephone Encounter (Signed)
 Received confirmation of successful fax transmission of signed DME order.

## 2024-01-19 ENCOUNTER — Encounter: Payer: Self-pay | Admitting: Nurse Practitioner

## 2024-01-19 ENCOUNTER — Inpatient Hospital Stay: Attending: Hematology and Oncology

## 2024-01-19 ENCOUNTER — Inpatient Hospital Stay

## 2024-01-19 ENCOUNTER — Inpatient Hospital Stay: Admitting: Nurse Practitioner

## 2024-01-19 ENCOUNTER — Inpatient Hospital Stay: Admitting: Hematology and Oncology

## 2024-01-19 VITALS — BP 137/56 | HR 63 | Temp 98.2°F | Resp 18 | Wt 132.7 lb

## 2024-01-19 DIAGNOSIS — C50412 Malignant neoplasm of upper-outer quadrant of left female breast: Secondary | ICD-10-CM | POA: Insufficient documentation

## 2024-01-19 DIAGNOSIS — Z79899 Other long term (current) drug therapy: Secondary | ICD-10-CM | POA: Diagnosis not present

## 2024-01-19 DIAGNOSIS — Z170 Estrogen receptor positive status [ER+]: Secondary | ICD-10-CM

## 2024-01-19 DIAGNOSIS — Z515 Encounter for palliative care: Secondary | ICD-10-CM

## 2024-01-19 DIAGNOSIS — F17210 Nicotine dependence, cigarettes, uncomplicated: Secondary | ICD-10-CM | POA: Insufficient documentation

## 2024-01-19 DIAGNOSIS — Z800 Family history of malignant neoplasm of digestive organs: Secondary | ICD-10-CM | POA: Insufficient documentation

## 2024-01-19 DIAGNOSIS — Z1732 Human epidermal growth factor receptor 2 negative status: Secondary | ICD-10-CM | POA: Insufficient documentation

## 2024-01-19 DIAGNOSIS — R197 Diarrhea, unspecified: Secondary | ICD-10-CM | POA: Diagnosis not present

## 2024-01-19 DIAGNOSIS — Z9221 Personal history of antineoplastic chemotherapy: Secondary | ICD-10-CM | POA: Diagnosis not present

## 2024-01-19 DIAGNOSIS — G893 Neoplasm related pain (acute) (chronic): Secondary | ICD-10-CM | POA: Diagnosis not present

## 2024-01-19 DIAGNOSIS — R82998 Other abnormal findings in urine: Secondary | ICD-10-CM | POA: Insufficient documentation

## 2024-01-19 DIAGNOSIS — Z79811 Long term (current) use of aromatase inhibitors: Secondary | ICD-10-CM | POA: Insufficient documentation

## 2024-01-19 DIAGNOSIS — Z9012 Acquired absence of left breast and nipple: Secondary | ICD-10-CM | POA: Insufficient documentation

## 2024-01-19 DIAGNOSIS — J4489 Other specified chronic obstructive pulmonary disease: Secondary | ICD-10-CM | POA: Diagnosis not present

## 2024-01-19 DIAGNOSIS — Z803 Family history of malignant neoplasm of breast: Secondary | ICD-10-CM | POA: Insufficient documentation

## 2024-01-19 DIAGNOSIS — C50919 Malignant neoplasm of unspecified site of unspecified female breast: Secondary | ICD-10-CM

## 2024-01-19 DIAGNOSIS — K219 Gastro-esophageal reflux disease without esophagitis: Secondary | ICD-10-CM | POA: Diagnosis not present

## 2024-01-19 DIAGNOSIS — Z8701 Personal history of pneumonia (recurrent): Secondary | ICD-10-CM | POA: Diagnosis not present

## 2024-01-19 DIAGNOSIS — E119 Type 2 diabetes mellitus without complications: Secondary | ICD-10-CM | POA: Insufficient documentation

## 2024-01-19 DIAGNOSIS — Z9049 Acquired absence of other specified parts of digestive tract: Secondary | ICD-10-CM | POA: Diagnosis not present

## 2024-01-19 DIAGNOSIS — C7951 Secondary malignant neoplasm of bone: Secondary | ICD-10-CM | POA: Diagnosis not present

## 2024-01-19 DIAGNOSIS — K5903 Drug induced constipation: Secondary | ICD-10-CM

## 2024-01-19 DIAGNOSIS — Z87440 Personal history of urinary (tract) infections: Secondary | ICD-10-CM | POA: Diagnosis not present

## 2024-01-19 DIAGNOSIS — Z1721 Progesterone receptor positive status: Secondary | ICD-10-CM | POA: Diagnosis not present

## 2024-01-19 DIAGNOSIS — Z923 Personal history of irradiation: Secondary | ICD-10-CM | POA: Diagnosis not present

## 2024-01-19 LAB — CMP (CANCER CENTER ONLY)
ALT: 5 U/L (ref 0–44)
AST: <10 U/L — ABNORMAL LOW (ref 15–41)
Albumin: 4.2 g/dL (ref 3.5–5.0)
Alkaline Phosphatase: 45 U/L (ref 38–126)
Anion gap: 6 (ref 5–15)
BUN: 14 mg/dL (ref 6–20)
CO2: 27 mmol/L (ref 22–32)
Calcium: 9.7 mg/dL (ref 8.9–10.3)
Chloride: 104 mmol/L (ref 98–111)
Creatinine: 0.79 mg/dL (ref 0.44–1.00)
GFR, Estimated: >60 mL/min (ref >60)
Glucose, Bld: 95 mg/dL (ref 70–99)
Potassium: 4.1 mmol/L (ref 3.5–5.1)
Sodium: 137 mmol/L (ref 135–145)
Total Bilirubin: 0.3 mg/dL (ref 0.0–1.2)
Total Protein: 6.9 g/dL (ref 6.5–8.1)

## 2024-01-19 LAB — CBC WITH DIFFERENTIAL (CANCER CENTER ONLY)
Abs Immature Granulocytes: 0.02 K/uL (ref 0.00–0.07)
Basophils Absolute: 0 K/uL (ref 0.0–0.1)
Basophils Relative: 1 %
Eosinophils Absolute: 0.1 K/uL (ref 0.0–0.5)
Eosinophils Relative: 2 %
HCT: 37.8 % (ref 36.0–46.0)
Hemoglobin: 13.4 g/dL (ref 12.0–15.0)
Immature Granulocytes: 0 %
Lymphocytes Relative: 24 %
Lymphs Abs: 1.5 K/uL (ref 0.7–4.0)
MCH: 35.4 pg — ABNORMAL HIGH (ref 26.0–34.0)
MCHC: 35.4 g/dL (ref 30.0–36.0)
MCV: 100 fL (ref 80.0–100.0)
Monocytes Absolute: 0.4 K/uL (ref 0.1–1.0)
Monocytes Relative: 6 %
Neutro Abs: 4.1 K/uL (ref 1.7–7.7)
Neutrophils Relative %: 67 %
Platelet Count: 174 K/uL (ref 150–400)
RBC: 3.78 MIL/uL — ABNORMAL LOW (ref 3.87–5.11)
RDW: 12.9 % (ref 11.5–15.5)
WBC Count: 6.2 K/uL (ref 4.0–10.5)
nRBC: 0 % (ref 0.0–0.2)

## 2024-01-19 MED ORDER — FULVESTRANT 250 MG/5ML IM SOSY
500.0000 mg | PREFILLED_SYRINGE | Freq: Once | INTRAMUSCULAR | Status: AC
  Administered 2024-01-19: 500 mg via INTRAMUSCULAR
  Filled 2024-01-19: qty 10

## 2024-01-19 MED ORDER — OXYCODONE HCL ER 10 MG PO T12A: 10.0000 mg | EXTENDED_RELEASE_TABLET | Freq: Two times a day (BID) | ORAL | 0 refills | Status: DC

## 2024-01-19 MED ORDER — DENOSUMAB 120 MG/1.7ML ~~LOC~~ SOLN
120.0000 mg | Freq: Once | SUBCUTANEOUS | Status: AC
  Administered 2024-01-19: 120 mg via SUBCUTANEOUS
  Filled 2024-01-19: qty 1.7

## 2024-01-19 NOTE — Assessment & Plan Note (Signed)
 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  ER 90%, PR 90%, HER-2 negative by FISH Posterior margin positive but no further surgery done because it is muscle on the back. T2N0 stage Ib Oncotype DX score 32: 20% risk of distant recurrence in 9 years   Treatment plan:  1. Adjuvant chemotherapy with dose dense Adriamycin  and Cytoxan  x4 followed by Taxol  weekly x12 completed 03/27/21 2. followed by radiation therapy started 04/26/20 3.  Followed by adjuvant antiestrogen therapy.  Started February 2022 ------------------------------------------------------------------------------------------------------------   Right neck swelling CT neck 11/12/2022: 9 mm level 5 lymph node, biopsy: Metastatic carcinoma consistent with breast primary, ER 95%, PR 80%, HER2 0-1+   Recommendation: PET CT scan 01/30/2023: 10 mm cervical LN, scattered pulm nodules below level of PET, Multiple bone mets Current treatment: Verzinio along with Faslodex  started 02/04/2023   Abemaciclib  toxicities:  Fatigue which is preventing her from staying as active as she used to be.    GI toxicities: Abdominal pain, nausea vomiting and diarrhea: Resolved after stopping Verzenio  I reduced the dosage of Verzinio to 100 mg p.o. twice daily and sent a new prescription. She will start taking this as soon as the medicine arrives.   Bone metastasis: On Xgeva  along with calcium  and vitamin D CT CAP 04/30/23: Bone mets more sclerotic (suggestive of response), lung nodules stable. CT abdomen pelvis 07/27/2023: Bone metastases stable CT CAP 12/30/2023: Bilateral pulmonary nodules some have minimally increased in size others are stable, overall increased sclerosis and the widespread bone metastases, no evidence of progression  Because of the increase in the nodularity, I recommended a short-term follow-up CT scan Return to clinic in 3 months for CT CAP and  follow-up  Return to clinic in 3 months for Xgeva  and to discuss results of scans

## 2024-01-19 NOTE — Progress Notes (Signed)
 "    Palliative Medicine [facility]  [phone] [phone]   Name: Julia [name] Date: 01/19/2024 MRN: [id number]  DOB: 11-01-1965  Patient Care Team: [name], MD as PCP - General (Family Medicine) [name], RN as Registered Nurse [name], RN as Registered Nurse [name], RN as Oncology Nurse Navigator [name], MD as Consulting Physician (Radiation Oncology) [name], MD as Consulting Physician (Hematology and Oncology) [name], MD as Consulting Physician (General Surgery) [name], RPH-CPP as Pharmacist (Hematology and Oncology) [name], [name], NP as Nurse Practitioner (Hospice and Palliative Medicine)   INTERVAL HISTORY: Julia [name] is a 58 y.o. 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 Palliative asked to see for symptom and pain management and goals of care.   SOCIAL HISTORY:     reports that she has been smoking cigarettes. She has a 62 pack-year smoking history. She has never used smokeless tobacco. She reports that she does not drink alcohol and does not use drugs.  ADVANCE DIRECTIVES:  None on file   CODE STATUS: Full code  PAST MEDICAL HISTORY: Past Medical History:  Diagnosis Date   Acute bronchitis 04/19/2021   Anxiety    over cancer diagnosis   Asthma    bronchial asthma    Cancer (HCC) 09/2019   left breast invasive mammary cancer   Chronic back pain    COPD (chronic obstructive pulmonary disease) (HCC)    smokes 1 1/2 ppd   Diabetes mellitus without complication (HCC)    GERD (gastroesophageal reflux disease)    Personal history of chemotherapy    Personal history of radiation therapy    Pneumonia    hx of 2018    Smoking     ALLERGIES:  is allergic to propoxyphene  n-acetaminophen , nabumetone, and rofecoxib.  MEDICATIONS:  Current Outpatient Medications  Medication Sig Dispense Refill   anastrozole  (ARIMIDEX ) 1 MG tablet TAKE 1 TABLET BY MOUTH EVERY DAY 90 tablet 3   calcium -vitamin D (OSCAL WITH D) 500-5 MG-MCG tablet Take 1 tablet by mouth daily. 30 tablet 5   famotidine  (PEPCID ) 20 MG tablet TAKE 1 TABLET BY MOUTH TWICE A DAY 180 tablet 1   gabapentin  (NEURONTIN ) 300 MG capsule Take 1 capsule (300 mg total) by mouth 3 (three) times daily. 180 capsule 3   hyaluronate sodium (RADIAPLEXRX) GEL Apply topically to affected area daily as directed. 170 g 3   ibuprofen  (ADVIL ) 800 MG tablet Take 1 tablet (800 mg total) by mouth every 8 (eight) hours as needed. 90 tablet 1   omeprazole  (PRILOSEC ) 20 MG capsule TAKE 1 CAPSULE BY MOUTH EVERY OTHER DAY 45 capsule 3   ondansetron  (ZOFRAN ) 8 MG tablet TAKE 1 TABLET BY MOUTH EVERY 8 HOURS AS NEEDED FOR NAUSEA OR VOMITING. 20 tablet 3   [START ON 01/20/2024] oxyCODONE  (OXYCONTIN ) 10 mg 12 hr tablet Take 1 tablet (10 mg total) by mouth every 12 (twelve) hours. 60 tablet 0   oxyCODONE -acetaminophen  (PERCOCET) 7.5-325 MG tablet Take 1 tablet by mouth every 4 (four) hours as needed for severe pain (pain score 7-10). 90 tablet 0   polyethylene glycol powder (MIRALAX ) 17 GM/SCOOP powder Mix 17 g in 4 oz of liquid and take by mouth daily. 476 g 0   SUMAtriptan  (IMITREX ) 50 MG tablet Take 1 tablet (50 mg total) by  mouth every 2 (two) hours as needed for migraine (max 2 doses in 24 hours.). May repeat in 2 hours if headache persists or recurs. 10 tablet 2   tiZANidine  (ZANAFLEX ) 2 MG tablet Take 1 tablet (2 mg total) by mouth every 8 (eight) hours as needed for muscle spasms. 30 tablet 3   VENTOLIN  HFA 108 (90 Base) MCG/ACT inhaler TAKE 2 PUFFS BY MOUTH EVERY 6 HOURS AS NEEDED FOR WHEEZE OR SHORTNESS OF BREATH 18 each 1   VERZENIO  100 MG tablet TAKE 1 TABLET BY MOUTH 2 TIMES A DAY 56 tablet 3   No current facility-administered  medications for this visit.    VITAL SIGNS: There were no vitals taken for this visit. There were no vitals filed for this visit.     Estimated body mass index is 22.78 kg/m as calculated from the following:   Height as of 11/26/23: 5' 4 (1.626 m).   Weight as of an earlier encounter on 01/19/24: 132 lb 11.2 oz (60.2 kg).   PERFORMANCE STATUS (ECOG) : 1 - Symptomatic but completely ambulatory  Assessment NAD RRR Normal breathing pattern AAO x3   IMPRESSION: Discussed the use of AI scribe software for clinical note transcription with the patient, who gave verbal consent to proceed.  History of Present Illness Julia [name] is a 58 year old female who presented to clinic for symptom management follow-up. Denies concerns with nausea, vomiting, or diarrhea. Occasional fatigue. Her appetite continues to improve. Her appetite is doing much better. Current weight is up to 132lbs.   We discussed her pain at length.  Julia [name] reports she has pain daily however controlled with current medication regimen. Her regimen includes gabapentin  300 mg 3 times a day, OxyContin  10 mg every 12 hours and Percocet 7.5 mg every 6 hours as needed.  Tolerating without difficulty. No adjustments at this time.   She continues to experience constipation and manages it with a stool softener and Miralax . No diarrhea is present.  All questions answered and support provided.   I discussed the importance of continued conversation with family and their medical providers regarding overall plan of care and treatment options, ensuring decisions are within the context of the patients values and GOCs. Assessment & Plan Cancer pain  Pain managed with Oxycontin , Percocet, and gabapentin . Pain persists but is controlled with current regimen. Experiences occasional judgmental comments from pharmacy staff regarding medication use, which is distressing. - Continue Oxycontin  10mg  every 12hours as prescribed - Continue  Percocet 7.5mg  tablet every 4 hours as needed - Continue gabapentin  300 mg three times a day - Refill Oxycontin  prescription - Advise to contact provider if issues with pharmacy persist - Consider changing pharmacy if issues continue  Constipation Constipation managed with Miralax  and stool softeners. Patient reports ongoing issues with bowel movements. - Continue Miralax  as prescribed - Continue stool softeners as needed  Follow-up care coordination Follow-up care coordination discussed, including upcoming appointments and injections. - Schedule next injection for November 14th - Coordinate appointment with Dr. [name] in December - Advise to contact scheduler to align appointments with injection dates - Arrange phone call follow-up for injection scheduling  Patient expressed understanding and was in agreement with this plan. She also understands that She can call the clinic at any time with any questions, concerns, or complaints.   Any controlled substances utilized were prescribed in the context of palliative care. PDMP has been reviewed.   Visit consisted of counseling and education dealing with the complex and  emotionally intense issues of symptom management and palliative care in the setting of serious and potentially life-threatening illness.  Julia [name], AGPCNP-BC  Palliative Medicine Team/[facility]    "

## 2024-01-19 NOTE — Progress Notes (Signed)
 Patient Care Team: [name], MD as PCP - General (Family Medicine) [name], RN as Registered Nurse [name], RN as Registered Nurse [name], RN as Oncology Nurse Navigator [name], MD as Consulting Physician (Radiation Oncology) [name], MD as Consulting Physician (Hematology and Oncology) [name], MD as Consulting Physician (General Surgery) [name], RPH-CPP as Pharmacist (Hematology and Oncology) [name], [name], NP as Nurse Practitioner [facility] and Palliative Medicine)  DIAGNOSIS:  Encounter Diagnosis  Name Primary?   Malignant neoplasm of upper-outer quadrant of left breast in female, estrogen receptor positive (HCC) Yes    SUMMARY OF ONCOLOGIC HISTORY: Oncology History  Malignant neoplasm of upper-outer quadrant of left breast in female, estrogen receptor positive (HCC)  08/25/2019 Initial Diagnosis   Palpable left breast mass x1 month. Diagnostic mammogram and US  on 08/18/19 showed a 2.8cm mass at the 2 o'clock position, and one lymph node with mild cortical thickening in the left axilla. Biopsy on 08/25/19 showed invasive mammary carcinoma in the breast, grade 3, HER-2 equivocal by IHC (2+) negative by FISH, ER+ >90%, PR+ >90%, left axilla negative   10/07/2019 Surgery   Left lumpectomy and sentinel lymph node biopsy [facility]) [phone]): Grade 3 IDC with DCIS, 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   10/18/2019 Cancer Staging   Staging form: Breast, AJCC 8th Edition - Pathologic stage from 10/18/2019: Stage IB (pT2, pN0(sn), cM0, G3, ER+, PR+, HER2-)   10/21/2019 Oncotype testing   The Oncotype DX score was 32 predicting a risk of outside the breast recurrence over the next 9 years of 20% if the patient's only systemic therapy is tamoxifen for 5 years.    11/15/2019 - 03/27/2020 Chemotherapy   Adjuvant chemotherapy with dose dense Adriamycin  and Cytoxan  x4 (11/15/2019 - 12/27/2019)  followed by Taxol  weekly x12 (01/09/2020 - 03/27/20).   04/25/2020 - 05/25/2020 Radiation Therapy   The patient initially received a dose of 42.56 Gy in 16 fractions to the breast using whole-breast tangent fields. This was delivered using a 3-D conformal technique. The pt received a boost delivering an additional 8 Gy in 4 fractions using a electron boost with electrons. The total dose was 50.56 Gy.   05/2020 -  Anti-estrogen oral therapy   Anastrozole    05/06/2021 Surgery   Left mastectomy: Benign (performed because of left breast fibrosis and pain)     CHIEF COMPLIANT: Follow-up to discuss results of scans  HISTORY OF PRESENT ILLNESS:  History of Present Illness Julia [name] is a 58 year old female with lung nodules and bone sclerosis who presents for follow-up on her recent scans and treatment response.  Lung nodules have shown minimal changes in size over the past five months, with some nodules slightly increasing from two to three millimeters. She has a persistent cough for several years, described as sounding like a 'barking dog,' but no dyspnea.  Bone sclerosis treatment continues, with increased sclerosis noted in multiple bone spots. No new bone lesions or new spots in other organs such as the liver or lymph nodes are detected.  Her current medication regimen has been in place for less than two years. Blood work shows normal white blood cell count, hemoglobin, and platelets. Tumor marker levels have fluctuated over the past year, ranging from ninety to sixty, with the latest result pending.     ALLERGIES:  is allergic to propoxyphene n-acetaminophen , nabumetone, and rofecoxib.  MEDICATIONS:  Current Outpatient Medications  Medication  Sig Dispense Refill   anastrozole  (ARIMIDEX ) 1 MG tablet TAKE 1 TABLET BY MOUTH EVERY DAY 90 tablet 3   calcium -vitamin D (OSCAL WITH D) 500-5 MG-MCG tablet Take 1 tablet by mouth daily. 30 tablet 5   famotidine  (PEPCID ) 20 MG tablet  TAKE 1 TABLET BY MOUTH TWICE A DAY 180 tablet 1   gabapentin  (NEURONTIN ) 300 MG capsule Take 1 capsule (300 mg total) by mouth 3 (three) times daily. 180 capsule 3   hyaluronate sodium (RADIAPLEXRX) GEL Apply topically to affected area daily as directed. 170 g 3   ibuprofen  (ADVIL ) 800 MG tablet Take 1 tablet (800 mg total) by mouth every 8 (eight) hours as needed. 90 tablet 1   omeprazole  (PRILOSEC ) 20 MG capsule TAKE 1 CAPSULE BY MOUTH EVERY OTHER DAY 45 capsule 3   ondansetron  (ZOFRAN ) 8 MG tablet TAKE 1 TABLET BY MOUTH EVERY 8 HOURS AS NEEDED FOR NAUSEA OR VOMITING. 20 tablet 3   oxyCODONE  (OXYCONTIN ) 10 mg 12 hr tablet Take 1 tablet (10 mg total) by mouth every 12 (twelve) hours. 60 tablet 0   oxyCODONE -acetaminophen  (PERCOCET) 7.5-325 MG tablet Take 1 tablet by mouth every 4 (four) hours as needed for severe pain (pain score 7-10). 90 tablet 0   polyethylene glycol powder (MIRALAX ) 17 GM/SCOOP powder Mix 17 g in 4 oz of liquid and take by mouth daily. 476 g 0   SUMAtriptan  (IMITREX ) 50 MG tablet Take 1 tablet (50 mg total) by mouth every 2 (two) hours as needed for migraine (max 2 doses in 24 hours.). May repeat in 2 hours if headache persists or recurs. 10 tablet 2   tiZANidine  (ZANAFLEX ) 2 MG tablet Take 1 tablet (2 mg total) by mouth every 8 (eight) hours as needed for muscle spasms. 30 tablet 3   VENTOLIN  HFA 108 (90 Base) MCG/ACT inhaler TAKE 2 PUFFS BY MOUTH EVERY 6 HOURS AS NEEDED FOR WHEEZE OR SHORTNESS OF BREATH 18 each 1   VERZENIO  100 MG tablet TAKE 1 TABLET BY MOUTH 2 TIMES A DAY 56 tablet 3   No current facility-administered medications for this visit.    PHYSICAL EXAMINATION: ECOG PERFORMANCE STATUS: 1 - Symptomatic but completely ambulatory  Vitals:   01/19/24 1002  BP: (!) 137/56  Pulse: 63  Resp: 18  Temp: 98.2 F (36.8 C)  SpO2: 98%   Filed Weights   01/19/24 1002  Weight: 132 lb 11.2 oz (60.2 kg)      LABORATORY DATA:  I have reviewed the data as  listed    Latest Ref Rng & Units 10/21/2023    8:44 AM 07/29/2023    8:46 AM 07/27/2023   11:48 AM  CMP  Glucose 70 - 99 mg/dL 892  97  895   BUN 6 - 20 mg/dL 9  9  10    Creatinine 0.44 - 1.00 mg/dL 9.17  9.36  9.36   Sodium 135 - 145 mmol/L 138  139  139   Potassium 3.5 - 5.1 mmol/L 4.2  3.9  3.8   Chloride 98 - 111 mmol/L 106  109  107   CO2 22 - 32 mmol/L 26  27  26    Calcium  8.9 - 10.3 mg/dL 9.1  8.3  8.9   Total Protein 6.5 - 8.1 g/dL 6.7  6.3  6.9   Total Bilirubin 0.0 - 1.2 mg/dL 0.3  0.3  0.6   Alkaline Phos 38 - 126 U/L 40  41  42   AST 15 -  41 U/L 9  9  10    ALT 0 - 44 U/L 6  <5  6     Lab Results  Component Value Date   WBC 6.2 01/19/2024   HGB 13.4 01/19/2024   HCT 37.8 01/19/2024   MCV 100.0 01/19/2024   PLT 174 01/19/2024   NEUTROABS 4.1 01/19/2024    ASSESSMENT & PLAN:  Malignant neoplasm of upper-outer quadrant of left breast in female, estrogen receptor positive (HCC) 10/07/2019:Left lumpectomy and sentinel lymph node biopsy [facility]): IDC with DCIS, 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  ER 90%, PR 90%, HER-2 negative by FISH Posterior margin positive but no further surgery done because it is muscle on the back. T2N0 stage Ib Oncotype DX score 32: 20% risk of distant recurrence in 9 years   Treatment plan:  1. Adjuvant chemotherapy with dose dense Adriamycin  and Cytoxan  x4 followed by Taxol  weekly x12 completed 03/27/21 2. followed by radiation therapy started 04/26/20 3.  Followed by adjuvant antiestrogen therapy.  Started February 2022 ------------------------------------------------------------------------------------------------------------   Right neck swelling CT neck 11/12/2022: 9 mm level 5 lymph node, biopsy: Metastatic carcinoma consistent with breast primary, ER 95%, PR 80%, HER2 0-1+   Recommendation: PET CT scan 01/30/2023: 10 mm cervical LN, scattered pulm nodules below level of PET, Multiple bone mets Current treatment: Verzinio  along with Faslodex  started 02/04/2023   Abemaciclib  toxicities:  Fatigue which is preventing her from staying as active as she used to be.    GI toxicities: Abdominal pain, nausea vomiting and diarrhea: Resolved after stopping Verzenio  I reduced the dosage of Verzinio to 100 mg p.o. twice daily and sent a new prescription. She will start taking this as soon as the medicine arrives.   Bone metastasis: On Xgeva  along with calcium  and vitamin D CT CAP 04/30/23: Bone mets more sclerotic (suggestive of response), lung nodules stable. CT abdomen pelvis 07/27/2023: Bone metastases stable CT CAP 12/30/2023: Bilateral pulmonary nodules some have minimally increased in size others are stable, overall increased sclerosis and the widespread bone metastases, no evidence of progression  Because of the increase in the nodularity, I recommended a short-term follow-up CT scan Return to clinic in 3 months for CT CAP and follow-up  Return to clinic in 3 months for Xgeva  and to discuss results of scans  Assessment & Plan Metastatic breast cancer to bone Scans show increased sclerosis in bones, indicating post-treatment improvement. No new lesions or metastases. Stable lung nodules suggest non-malignancy. Current treatment effective. Normal blood counts indicate no hematological toxicity. Tumor markers pending, not primary for treatment decisions. - Continue current treatment regimen. - Schedule follow-up scan in three months to monitor lung nodules. - Monitor blood counts and liver function tests regularly. - Review tumor marker results when available.  Chronic cough Chronic barking cough for years, no dyspnea. Etiology unclear, minimal impact on quality of life. - Consider pulmonologist referral if cough worsens or becomes bothersome.  Constipation Constipation noted, manageable.  Follow-up Follow-up plans discussed for monitoring condition and treatment efficacy. - Schedule additional injection  appointments. - Stop by scheduling to pick up new appointments.      No orders of the defined types were placed in this encounter.  The patient has a good understanding of the overall plan. she agrees with it. she will call with any problems that may develop before the next visit here.  I personally spent a total of 30 minutes in the care of the patient today including preparing to  see the patient, getting/reviewing separately obtained history, performing a medically appropriate exam/evaluation, counseling and educating, placing orders, referring and communicating with other health care professionals, documenting clinical information in the EHR, independently interpreting results, communicating results, and coordinating care.   Viinay K Talishia Betzler, MD 01/19/24

## 2024-01-20 ENCOUNTER — Ambulatory Visit

## 2024-01-20 ENCOUNTER — Ambulatory Visit: Admitting: Hematology and Oncology

## 2024-01-20 ENCOUNTER — Encounter: Payer: Self-pay | Admitting: Hematology and Oncology

## 2024-01-20 ENCOUNTER — Telehealth: Payer: Self-pay | Admitting: Nurse Practitioner

## 2024-01-20 ENCOUNTER — Other Ambulatory Visit

## 2024-01-20 LAB — CANCER ANTIGEN 27.29: CA 27.29: 93.6 U/mL — ABNORMAL HIGH (ref 0.0–38.6)

## 2024-01-20 NOTE — Telephone Encounter (Signed)
 Scheduled palliative appointment and rescheduled injection for same day as palliative, per patient. Called and spoke with the patient, she is aware.

## 2024-01-25 ENCOUNTER — Telehealth: Payer: Self-pay

## 2024-01-25 ENCOUNTER — Other Ambulatory Visit (HOSPITAL_COMMUNITY): Payer: Self-pay

## 2024-01-25 NOTE — Telephone Encounter (Signed)
 Oral Oncology Patient Advocate Encounter   Received notification that prior authorization for VERZENIO   is due for renewal.   PA submitted on 01/25/24 Key [id number] Status is pending      Charlott Hamilton,  CPhT-Adv  she/her/hers [facility]  [facility] Pharmacy Technician Patient Advocate Specialist III [facility] Phone: [phone]  Fax: [phone] [email or web address]

## 2024-01-26 NOTE — Telephone Encounter (Signed)
 Oral Oncology Patient Advocate Encounter  Prior Authorization Renewal for Verzenio  has been approved.    Patient Filling through [facility]. It appears they have the approved PA details.    Charlott Hamilton,  CPhT-Adv  she/her/hers [facility]  [facility] Pharmacy Technician Patient Advocate Specialist III [facility] Phone: [phone]  Fax: [phone] [email or web address]

## 2024-01-29 ENCOUNTER — Telehealth: Payer: Self-pay | Admitting: *Deleted

## 2024-01-29 NOTE — Telephone Encounter (Signed)
 Received call from pt requesting refill on pain medication.  Pt states palliative care provider is out of the office and she needs pain medication refill now.  RN reviewed recently filled medications and on my end, it looks like pt should still have enough for another 1-2 weeks.  Pt states palliative care has instructed her to contact office 1 week prior to being out of medication.  RN instructed pt to call back next week to speak with palliative care and refill needs. Pt also states she is experiencing diarrhea, pt educated pt on OTC imodium usage as well as BRAT diet, pt verbalized understanding.

## 2024-02-01 ENCOUNTER — Telehealth: Payer: Self-pay

## 2024-02-01 ENCOUNTER — Other Ambulatory Visit: Payer: Self-pay

## 2024-02-01 DIAGNOSIS — Z515 Encounter for palliative care: Secondary | ICD-10-CM

## 2024-02-01 DIAGNOSIS — G893 Neoplasm related pain (acute) (chronic): Secondary | ICD-10-CM

## 2024-02-01 DIAGNOSIS — R197 Diarrhea, unspecified: Secondary | ICD-10-CM

## 2024-02-01 DIAGNOSIS — R350 Frequency of micturition: Secondary | ICD-10-CM

## 2024-02-01 DIAGNOSIS — C50412 Malignant neoplasm of upper-outer quadrant of left female breast: Secondary | ICD-10-CM

## 2024-02-01 DIAGNOSIS — C7951 Secondary malignant neoplasm of bone: Secondary | ICD-10-CM

## 2024-02-01 MED ORDER — OXYCODONE-ACETAMINOPHEN 7.5-325 MG PO TABS
1.0000 | ORAL_TABLET | ORAL | 0 refills | Status: DC | PRN
Start: 2024-02-01 — End: 2024-02-17

## 2024-02-01 NOTE — Telephone Encounter (Addendum)
 S/w patient regarding message about ongoing diarrhea and UTI symptoms.  Dr. Gudena made aware and would like patient to hold Verzenio  until she is seen in the clinic. Patient scheduled for labs and [facility] visit tomorrow, 10/28 for further evaluation. Patient aware of the new appointment date and times and is agreeable to coming into the clinic. All questions answered during call.

## 2024-02-01 NOTE — Telephone Encounter (Signed)
 Received voicemail from pt who reports new ongoing diarrhea since Thursday 10/23 upon restarting her verzenio . In her voicemail pt reports OTC imodium not controlling diarrhea and new UTI symptoms. Previously when this happened dose was reduced. Routed to oncology team for next steps.

## 2024-02-02 ENCOUNTER — Inpatient Hospital Stay (HOSPITAL_BASED_OUTPATIENT_CLINIC_OR_DEPARTMENT_OTHER): Admitting: Physician Assistant

## 2024-02-02 ENCOUNTER — Inpatient Hospital Stay

## 2024-02-02 VITALS — BP 129/62 | HR 66 | Temp 97.9°F | Resp 14 | Wt 133.3 lb

## 2024-02-02 DIAGNOSIS — R82998 Other abnormal findings in urine: Secondary | ICD-10-CM | POA: Diagnosis not present

## 2024-02-02 DIAGNOSIS — K219 Gastro-esophageal reflux disease without esophagitis: Secondary | ICD-10-CM | POA: Diagnosis not present

## 2024-02-02 DIAGNOSIS — Z79899 Other long term (current) drug therapy: Secondary | ICD-10-CM | POA: Diagnosis not present

## 2024-02-02 DIAGNOSIS — R194 Change in bowel habit: Secondary | ICD-10-CM

## 2024-02-02 DIAGNOSIS — C50919 Malignant neoplasm of unspecified site of unspecified female breast: Secondary | ICD-10-CM

## 2024-02-02 DIAGNOSIS — Z170 Estrogen receptor positive status [ER+]: Secondary | ICD-10-CM

## 2024-02-02 DIAGNOSIS — E119 Type 2 diabetes mellitus without complications: Secondary | ICD-10-CM | POA: Diagnosis not present

## 2024-02-02 DIAGNOSIS — Z9221 Personal history of antineoplastic chemotherapy: Secondary | ICD-10-CM | POA: Diagnosis not present

## 2024-02-02 DIAGNOSIS — R350 Frequency of micturition: Secondary | ICD-10-CM

## 2024-02-02 DIAGNOSIS — Z800 Family history of malignant neoplasm of digestive organs: Secondary | ICD-10-CM | POA: Diagnosis not present

## 2024-02-02 DIAGNOSIS — C50412 Malignant neoplasm of upper-outer quadrant of left female breast: Secondary | ICD-10-CM

## 2024-02-02 DIAGNOSIS — R197 Diarrhea, unspecified: Secondary | ICD-10-CM

## 2024-02-02 DIAGNOSIS — Z79811 Long term (current) use of aromatase inhibitors: Secondary | ICD-10-CM | POA: Diagnosis not present

## 2024-02-02 DIAGNOSIS — F17210 Nicotine dependence, cigarettes, uncomplicated: Secondary | ICD-10-CM | POA: Diagnosis not present

## 2024-02-02 LAB — CMP (CANCER CENTER ONLY)
ALT: <5 U/L (ref 0–44)
AST: <10 U/L — ABNORMAL LOW (ref 15–41)
Albumin: 3.8 g/dL (ref 3.5–5.0)
Alkaline Phosphatase: 42 U/L (ref 38–126)
Anion gap: 6 (ref 5–15)
BUN: 7 mg/dL (ref 6–20)
CO2: 25 mmol/L (ref 22–32)
Calcium: 8.7 mg/dL — ABNORMAL LOW (ref 8.9–10.3)
Chloride: 108 mmol/L (ref 98–111)
Creatinine: 0.73 mg/dL (ref 0.44–1.00)
GFR, Estimated: >60 mL/min (ref >60)
Glucose, Bld: 100 mg/dL — ABNORMAL HIGH (ref 70–99)
Potassium: 4.3 mmol/L (ref 3.5–5.1)
Sodium: 139 mmol/L (ref 135–145)
Total Bilirubin: 0.2 mg/dL (ref 0.0–1.2)
Total Protein: 6.5 g/dL (ref 6.5–8.1)

## 2024-02-02 LAB — URINALYSIS, COMPLETE (UACMP) WITH MICROSCOPIC
Bilirubin Urine: NEGATIVE
Glucose, UA: NEGATIVE mg/dL
Hgb urine dipstick: NEGATIVE
Ketones, ur: NEGATIVE mg/dL
Nitrite: NEGATIVE
Protein, ur: NEGATIVE mg/dL
Specific Gravity, Urine: 1.011 (ref 1.005–1.030)
pH: 7 (ref 5.0–8.0)

## 2024-02-02 LAB — CBC WITH DIFFERENTIAL (CANCER CENTER ONLY)
Abs Immature Granulocytes: 0.02 K/uL (ref 0.00–0.07)
Basophils Absolute: 0.1 K/uL (ref 0.0–0.1)
Basophils Relative: 1 %
Eosinophils Absolute: 0.1 K/uL (ref 0.0–0.5)
Eosinophils Relative: 2 %
HCT: 36 % (ref 36.0–46.0)
Hemoglobin: 12.9 g/dL (ref 12.0–15.0)
Immature Granulocytes: 0 %
Lymphocytes Relative: 28 %
Lymphs Abs: 1.5 K/uL (ref 0.7–4.0)
MCH: 35.9 pg — ABNORMAL HIGH (ref 26.0–34.0)
MCHC: 35.8 g/dL (ref 30.0–36.0)
MCV: 100.3 fL — ABNORMAL HIGH (ref 80.0–100.0)
Monocytes Absolute: 0.4 K/uL (ref 0.1–1.0)
Monocytes Relative: 7 %
Neutro Abs: 3.2 K/uL (ref 1.7–7.7)
Neutrophils Relative %: 62 %
Platelet Count: 169 K/uL (ref 150–400)
RBC: 3.59 MIL/uL — ABNORMAL LOW (ref 3.87–5.11)
RDW: 13.2 % (ref 11.5–15.5)
WBC Count: 5.2 K/uL (ref 4.0–10.5)
nRBC: 0 % (ref 0.0–0.2)

## 2024-02-02 LAB — MAGNESIUM: Magnesium: 2.1 mg/dL (ref 1.7–2.4)

## 2024-02-02 MED ORDER — SODIUM CHLORIDE 0.9 % IV SOLN: Freq: Once | INTRAVENOUS | Status: AC

## 2024-02-02 MED ORDER — NITROFURANTOIN MONOHYD MACRO 100 MG PO CAPS: 100.0000 mg | ORAL_CAPSULE | Freq: Two times a day (BID) | ORAL | 0 refills | Status: AC

## 2024-02-02 NOTE — Patient Instructions (Signed)
 To reduce the frequency of bowel movements, adjust your diet by limiting high-fiber and high-fat foods, and stay hydrated with water while potentially avoiding excessive caffeine and alcohol. Manage stress through relaxation techniques and incorporate regular, light exercise, such as walking. Eating meals at consistent times can also help regulate your bowels.

## 2024-02-02 NOTE — Progress Notes (Signed)
 Symptom Management Consult Note [name]    Patient Care Team: Julia Arlyss RAMAN, MD as PCP - General (Family Medicine) Julia Jesusa HERO, RN as Registered Nurse Julia Jesusa HERO, RN as Registered Nurse Julia Nanetta SAILOR, RN as Oncology Nurse Navigator Julia Rush, MD as Consulting Physician (Radiation Oncology) Julia Potts, MD as Consulting Physician (Hematology and Oncology) Julia Deward MOULD, MD as Consulting Physician (General Surgery) Julia [name], RPH-CPP as Pharmacist (Hematology and Oncology) Julia [name], Julia SAILOR, NP as Nurse Practitioner Julia [name] and Palliative Medicine)    Name / MRN / DOB: Julia [name]  [id number]  [birth year]   Date of visit: 02/02/2024   Chief Complaint/Reason for visit: diarrhea   Current Therapy: Verzenio     ASSESSMENT AND PLAN Patient is a 58 y.o. female with oncologic history of malignant neoplasm of upper-outer quadrant of left breast in female, estrogen receptor positive followed by Dr. [name].  I have viewed most recent oncology note and lab work.  #Malignant neoplasm of upper-outer quadrant of left breast in female, estrogen receptor positive - Next appointment with oncologist is 04/27/24   #Frequent bowel movements - Patient well appearing. VSS. Benign abdominal exam. - Increased stool frequency with toothpaste-like consistency. No abdominal pain, fever, or blood. No recent antibiotics or travel. Previously on Miralax , stopped due to loose stools. Imodium ineffective although has been inconsistent with dosing. - Per [place] stool chart stool is type 4.  Would not classify this as diarrhea at this time. Discussed trying to adjust your diet by limiting high-fiber and high-fat foods and stay hydrated with water while potentially avoiding excessive caffeine. - 1L NS administered in clinic for hydration support. Labs without significant electrolyte derangement. No replacement needed today. - Patient prefers to  continue Verzenio  as she feels strongly this is not related. Patient was seen by me previously in April 2025 for liquid stool, nausea and vomiting that resolved when Verzenio  was held. She has tolerated dose reduced Verzenio  without major issues.   #Malodorous urine and frequency Urinary odor without dysuria or increased frequency. Previous UTI in September with similar symptoms treated with antibiotics.  - UA shows small leukocytes and 6-10 WBC. Symptoms have only been present x 1 day. As symptoms are similar with previous UTI will treat with a course of Macrobid . Will follow up on urine culture.  - Reviewed previous urine culure from 12/17/23 showing >100k colonies/ml e coli with pan susceptibility  Strict ED precautions discussed should symptoms worsen.   HEME/ONC HISTORY Oncology History  Malignant neoplasm of upper-outer quadrant of left breast in female, estrogen receptor positive (HCC)  08/25/2019 Initial Diagnosis   Palpable left breast mass x1 month. Diagnostic mammogram and US  on 08/18/19 showed a 2.8cm mass at the 2 o'clock position, and one lymph node with mild cortical thickening in the left axilla. Biopsy on 08/25/19 showed invasive mammary carcinoma in the breast, grade 3, HER-2 equivocal by IHC (2+) negative by FISH, ER+ >90%, PR+ >90%, left axilla negative   10/07/2019 Surgery   Left lumpectomy and sentinel lymph node biopsy [facility]) [phone]): Grade 3 IDC with DCIS, 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   10/18/2019 Cancer Staging   Staging form: Breast, AJCC 8th Edition - Pathologic stage from 10/18/2019: Stage IB (pT2, pN0(sn), cM0, G3, ER+, PR+, HER2-)   10/21/2019 Oncotype testing   The Oncotype DX score was 32 predicting a risk of outside the breast recurrence over the next 9 years of 20% if the  patient's only systemic therapy is tamoxifen for 5 years.    11/15/2019 - 03/27/2020 Chemotherapy   Adjuvant chemotherapy with dose dense Adriamycin  and Cytoxan  x4  (11/15/2019 - 12/27/2019) followed by Taxol  weekly x12 (01/09/2020 - 03/27/20).   04/25/2020 - 05/25/2020 Radiation Therapy   The patient initially received a dose of 42.56 Gy in 16 fractions to the breast using whole-breast tangent fields. This was delivered using a 3-D conformal technique. The pt received a boost delivering an additional 8 Gy in 4 fractions using a electron boost with electrons. The total dose was 50.56 Gy.   05/2020 -  Anti-estrogen oral therapy   Anastrozole    05/06/2021 Surgery   Left mastectomy: Benign (performed because of left breast fibrosis and pain)       INTERVAL HISTORY  Discussed the use of AI scribe software for clinical note transcription with the patient, who gave verbal consent to proceed.    Julia [name] is a 58 y.o. female with oncologic history as above presenting to [facility] today with chief complaint of diarrhea and urinary frequency. Patient presents unaccompanied to visit today.   She has been experiencing diarrhea since x5 days, with three to four episodes per day. The stools are soft, resembling the consistency of toothpaste. Previously, her bowel movements were regular, occurring twice a week, and were managed with Miralax  due to constipation from pain medication. She discontinued Miralax  last week as her stools became too loose. She has been taking Imodium since the onset of diarrhea however not consistent dosing. She did not take any Imodium yesterday or today. Denies fever, chills, nausea, vomiting, abdominal pain or blood in the stool. Her diet mainly consists of grilled or baked foods, and she has been cooking at home more frequently. Denies recent travel or antibiotic use. Spouse is healthy.   Urinary symptoms began yesterday characterized by a noticeable odor in her urine and increased frequency. There is no associated pain or burning. She had a urinary tract infection in September, treated with an antibiotic, but she does not recall the  name of the medication. The previous infection was also noted for a similar odor without burning. She felt that infection completely resolved.    ROS  All other systems are reviewed and are negative for acute change except as noted in the HPI.    Allergies  Allergen Reactions   Propoxyphene N-Acetaminophen  Hives   Nabumetone Rash    She can tolerate ibuprofen  w/o difficulty   Rofecoxib Rash    Nightmares (vioxx)     Past Medical History:  Diagnosis Date   Acute bronchitis 04/19/2021   Anxiety    over cancer diagnosis   Asthma    bronchial asthma    Cancer (HCC) 09/2019   left breast invasive mammary cancer   Chronic back pain    COPD (chronic obstructive pulmonary disease) (HCC)    smokes 1 1/2 ppd   Diabetes mellitus without complication (HCC)    GERD (gastroesophageal reflux disease)    Personal history of chemotherapy    Personal history of radiation therapy    Pneumonia    hx of 2018    Smoking      Past Surgical History:  Procedure Laterality Date   BREAST BIOPSY Left 09/2019   x2   BREAST BIOPSY Left 11/22/2020   BREAST LUMPECTOMY Left 10/2019   BREAST LUMPECTOMY WITH SENTINEL LYMPH NODE BIOPSY Left 10/07/2019   Procedure: LEFT BREAST LUMPECTOMY WITH SENTINEL LYMPH NODE BX;  Surgeon: Julia,  Deward MOULD, MD;  Location: [name];  Service: General;  Laterality: Left;  PEC BLOCK   CHOLECYSTECTOMY     PORTACATH PLACEMENT Right 11/10/2019   Procedure: INSERTION PORT-A-CATH;  Surgeon: Julia Deward MOULD, MD;  Location: [facility];  Service: General;  Laterality: Right;   TONSILLECTOMY     TOTAL MASTECTOMY Left 05/06/2021   Procedure: LEFT TOTAL MASTECTOMY;  Surgeon: Julia Deward MOULD, MD;  Location: [facility];  Service: General;  Laterality: Left;   VULVECTOMY N/A 01/30/2015   Procedure: WIDE LOCAL EXCISION VULVA;  Surgeon: Maurilio Ship, MD;  Location: [facility];  Service: Gynecology;  Laterality: N/A;    Social History   Socioeconomic History   Marital status:  Married    Spouse name: Not on file   Number of children: 1   Years of education: 12   Highest education level: 11th grade  Occupational History   Occupation: Conservation Officer, [employer] at [employer]  Tobacco Use   Smoking status: Every Day    Current packs/day: 2.00    Average packs/day: 2.0 packs/day for 31.0 years (62.0 ttl pk-yrs)    Types: Cigarettes   Smokeless tobacco: Never  Vaping Use   Vaping status: Never Used  Substance and Sexual Activity   Alcohol use: No    Alcohol/week: 0.0 standard drinks of alcohol   Drug use: No   Sexual activity: Yes    Birth control/protection: Post-menopausal  Other Topics Concern   Not on file  Social History Narrative   One son   Married 2000 (had been together since 1984)   Social Drivers of Corporate Investment Banker Strain: Patient Declined (05/31/2023)   Overall Financial Resource Strain (CARDIA)    Difficulty of Paying Living Expenses: Patient declined  Food Insecurity: No Food Insecurity (08/27/2023)   Hunger Vital Sign    Worried About Running Out of Food in the Last Year: Never true    Ran Out of Food in the Last Year: Never true  Transportation Needs: Patient Declined (05/31/2023)   PRAPARE - Transportation    Lack of Transportation (Medical): Patient declined    Lack of Transportation (Non-Medical): Patient declined  Physical Activity: Unknown (05/31/2023)   Exercise Vital Sign    Days of Exercise per Week: 7 days    Minutes of Exercise per Session: Patient declined  Stress: Stress Concern Present (05/31/2023)   [employer] of Occupational Health - Occupational Stress Questionnaire    Feeling of Stress : Very much  Social Connections: Unknown (05/31/2023)   Social Connection and Isolation Panel    Frequency of Communication with Friends and Family: Three times a week    Frequency of Social Gatherings with Friends and Family: Once a week    Attends Religious Services: Patient declined    Database Administrator or  Organizations: No    Attends Engineer, Structural: Not on file    Marital Status: Married  Catering Manager Violence: Not on file    Family History  Problem Relation Age of Onset   Hypertension Mother    Diabetes Mother        insulin  dependent   Heart disease Mother    Diabetes Father    Heart disease Father        CAD   Hypertension Brother    Kidney disease Brother        kidney failure/resolved   Colon cancer Maternal Aunt        dx'd at ~62   Breast  cancer Maternal Aunt 65     Current Outpatient Medications:    nitrofurantoin , macrocrystal-monohydrate, (MACROBID ) 100 MG capsule, Take 1 capsule (100 mg total) by mouth 2 (two) times daily for 5 days., Disp: 10 capsule, Rfl: 0   anastrozole  (ARIMIDEX ) 1 MG tablet, TAKE 1 TABLET BY MOUTH EVERY DAY, Disp: 90 tablet, Rfl: 3   calcium -vitamin D (OSCAL WITH D) 500-5 MG-MCG tablet, Take 1 tablet by mouth daily., Disp: 30 tablet, Rfl: 5   famotidine  (PEPCID ) 20 MG tablet, TAKE 1 TABLET BY MOUTH TWICE A DAY, Disp: 180 tablet, Rfl: 1   gabapentin  (NEURONTIN ) 300 MG capsule, Take 1 capsule (300 mg total) by mouth 3 (three) times daily., Disp: 180 capsule, Rfl: 3   hyaluronate sodium (RADIAPLEXRX) GEL, Apply topically to affected area daily as directed., Disp: 170 g, Rfl: 3   ibuprofen  (ADVIL ) 800 MG tablet, Take 1 tablet (800 mg total) by mouth every 8 (eight) hours as needed., Disp: 90 tablet, Rfl: 1   omeprazole  (PRILOSEC ) 20 MG capsule, TAKE 1 CAPSULE BY MOUTH EVERY OTHER DAY, Disp: 45 capsule, Rfl: 3   ondansetron  (ZOFRAN ) 8 MG tablet, TAKE 1 TABLET BY MOUTH EVERY 8 HOURS AS NEEDED FOR NAUSEA OR VOMITING., Disp: 20 tablet, Rfl: 3   oxyCODONE  (OXYCONTIN ) 10 mg 12 hr tablet, Take 1 tablet (10 mg total) by mouth every 12 (twelve) hours., Disp: 60 tablet, Rfl: 0   oxyCODONE -acetaminophen  (PERCOCET) 7.5-325 MG tablet, Take 1 tablet by mouth every 4 (four) hours as needed for severe pain (pain score 7-10)., Disp: 90 tablet, Rfl: 0    polyethylene glycol powder (MIRALAX ) 17 GM/SCOOP powder, Mix 17 g in 4 oz of liquid and take by mouth daily., Disp: 476 g, Rfl: 0   SUMAtriptan  (IMITREX ) 50 MG tablet, Take 1 tablet (50 mg total) by mouth every 2 (two) hours as needed for migraine (max 2 doses in 24 hours.). May repeat in 2 hours if headache persists or recurs., Disp: 10 tablet, Rfl: 2   tiZANidine  (ZANAFLEX ) 2 MG tablet, Take 1 tablet (2 mg total) by mouth every 8 (eight) hours as needed for muscle spasms., Disp: 30 tablet, Rfl: 3   VENTOLIN  HFA 108 (90 Base) MCG/ACT inhaler, TAKE 2 PUFFS BY MOUTH EVERY 6 HOURS AS NEEDED FOR WHEEZE OR SHORTNESS OF BREATH, Disp: 18 each, Rfl: 1   VERZENIO  100 MG tablet, TAKE 1 TABLET BY MOUTH 2 TIMES A DAY, Disp: 56 tablet, Rfl: 3 No current facility-administered medications for this visit.  Facility-Administered Medications Ordered in Other Visits:    0.9 %  sodium chloride  infusion, , Intravenous, Once, Walisiewicz, Phinehas Grounds E, PA-C, Last Rate: 500 mL/hr at 02/02/24 1019, New Bag at 02/02/24 1019  PHYSICAL EXAM ECOG FS:1 - Symptomatic but completely ambulatory   There were no vitals filed for this visit. Physical Exam Vitals and nursing note reviewed.  Constitutional:      Appearance: She is not ill-appearing or toxic-appearing.  HENT:     Head: Normocephalic.  Eyes:     Conjunctiva/sclera: Conjunctivae normal.  Cardiovascular:     Rate and Rhythm: Normal rate and regular rhythm.     Pulses: Normal pulses.     Heart sounds: Normal heart sounds.  Pulmonary:     Effort: Pulmonary effort is normal.     Breath sounds: Normal breath sounds.  Abdominal:     General: There is no distension.  Musculoskeletal:     Cervical back: Normal range of motion.  Skin:    General:  Skin is warm and dry.  Neurological:     Mental Status: She is alert.        LABORATORY DATA I have reviewed the data as listed    Latest Ref Rng & Units 02/02/2024    9:34 AM 01/19/2024    9:22 AM 10/21/2023     8:44 AM  CBC  WBC 4.0 - 10.5 K/uL 5.2  6.2  5.6   Hemoglobin 12.0 - 15.0 g/dL 87.0  86.5  86.8   Hematocrit 36.0 - 46.0 % 36.0  37.8  36.6   Platelets 150 - 400 K/uL 169  174  177         Latest Ref Rng & Units 02/02/2024    9:34 AM 01/19/2024    9:22 AM 10/21/2023    8:44 AM  CMP  Glucose 70 - 99 mg/dL 899  95  892   BUN 6 - 20 mg/dL 7  14  9    Creatinine 0.44 - 1.00 mg/dL 9.26  9.20  9.17   Sodium 135 - 145 mmol/L 139  137  138   Potassium 3.5 - 5.1 mmol/L 4.3  4.1  4.2   Chloride 98 - 111 mmol/L 108  104  106   CO2 22 - 32 mmol/L 25  27  26    Calcium  8.9 - 10.3 mg/dL 8.7  9.7  9.1   Total Protein 6.5 - 8.1 g/dL 6.5  6.9  6.7   Total Bilirubin 0.0 - 1.2 mg/dL 0.2  0.3  0.3   Alkaline Phos 38 - 126 U/L 42  45  40   AST 15 - 41 U/L <10  <10  9   ALT 0 - 44 U/L 5  5  6         RADIOGRAPHIC STUDIES (from last 24 hours if applicable) I have personally reviewed the radiological images as listed and agreed with the findings in the report. No results found.      Visit Diagnosis: 1. Urine frequency   2. Frequent bowel movements   3. Malignant neoplasm of upper-outer quadrant of left breast in female, estrogen receptor positive (HCC)      No orders of the defined types were placed in this encounter.   All questions were answered. The patient knows to call the clinic with any problems, questions or concerns. No barriers to learning was detected.  A total of more than 30 minutes were spent on this encounter with face-to-face time and non-face-to-face time, including preparing to see the patient, ordering tests and/or medications, counseling the patient and coordination of care as outlined above.    Thank you for allowing me to participate in the care of this patient.    Zoeie Ritter E  Walisiewicz, PA-C Department of Hematology/Oncology [facility] at [name] Phone: [phone]  [phone]    02/02/2024 11:28 AM

## 2024-02-02 NOTE — Patient Instructions (Signed)

## 2024-02-04 ENCOUNTER — Other Ambulatory Visit: Payer: Self-pay | Admitting: Nurse Practitioner

## 2024-02-04 LAB — URINE CULTURE: Culture: 80000 — AB

## 2024-02-17 ENCOUNTER — Inpatient Hospital Stay: Admitting: Nurse Practitioner

## 2024-02-17 ENCOUNTER — Inpatient Hospital Stay: Attending: Hematology and Oncology

## 2024-02-17 ENCOUNTER — Encounter: Payer: Self-pay | Admitting: Nurse Practitioner

## 2024-02-17 VITALS — Wt 132.6 lb

## 2024-02-17 VITALS — BP 122/69 | HR 74 | Temp 99.2°F | Resp 16

## 2024-02-17 DIAGNOSIS — Z515 Encounter for palliative care: Secondary | ICD-10-CM

## 2024-02-17 DIAGNOSIS — G893 Neoplasm related pain (acute) (chronic): Secondary | ICD-10-CM

## 2024-02-17 DIAGNOSIS — Z9221 Personal history of antineoplastic chemotherapy: Secondary | ICD-10-CM | POA: Diagnosis not present

## 2024-02-17 DIAGNOSIS — Z923 Personal history of irradiation: Secondary | ICD-10-CM | POA: Diagnosis not present

## 2024-02-17 DIAGNOSIS — C50412 Malignant neoplasm of upper-outer quadrant of left female breast: Secondary | ICD-10-CM

## 2024-02-17 DIAGNOSIS — C50919 Malignant neoplasm of unspecified site of unspecified female breast: Secondary | ICD-10-CM

## 2024-02-17 DIAGNOSIS — Z79899 Other long term (current) drug therapy: Secondary | ICD-10-CM | POA: Diagnosis not present

## 2024-02-17 DIAGNOSIS — R530 Neoplastic (malignant) related fatigue: Secondary | ICD-10-CM | POA: Diagnosis not present

## 2024-02-17 DIAGNOSIS — Z79811 Long term (current) use of aromatase inhibitors: Secondary | ICD-10-CM | POA: Diagnosis not present

## 2024-02-17 DIAGNOSIS — F17210 Nicotine dependence, cigarettes, uncomplicated: Secondary | ICD-10-CM | POA: Diagnosis not present

## 2024-02-17 DIAGNOSIS — Z8701 Personal history of pneumonia (recurrent): Secondary | ICD-10-CM | POA: Diagnosis not present

## 2024-02-17 DIAGNOSIS — Z170 Estrogen receptor positive status [ER+]: Secondary | ICD-10-CM | POA: Diagnosis not present

## 2024-02-17 MED ORDER — FULVESTRANT 250 MG/5ML IM SOSY
500.0000 mg | PREFILLED_SYRINGE | Freq: Once | INTRAMUSCULAR | Status: AC
  Administered 2024-02-17: 500 mg via INTRAMUSCULAR
  Filled 2024-02-17: qty 10

## 2024-02-18 ENCOUNTER — Encounter: Payer: Self-pay | Admitting: Hematology and Oncology

## 2024-02-18 MED ORDER — OXYCODONE HCL ER 15 MG PO T12A: 15.0000 mg | EXTENDED_RELEASE_TABLET | Freq: Two times a day (BID) | ORAL | 0 refills | Status: DC

## 2024-02-18 MED ORDER — OXYCODONE-ACETAMINOPHEN 7.5-325 MG PO TABS: 1.0000 | ORAL_TABLET | ORAL | 0 refills | Status: DC | PRN

## 2024-02-18 NOTE — Progress Notes (Signed)
 Palliative Medicine [facility]  [phone]) [phone] [phone]   Name: Julia [name] Date: 02/18/2024 MRN: [id number]  DOB: [birth year]  Patient Care Team: [name], MD as PCP - General (Family Medicine) [name], RN as Registered Nurse [name], RN as Registered Nurse [name], RN as Oncology Nurse Navigator [name], MD as Consulting Physician (Radiation Oncology) [name], MD as Consulting Physician (Hematology and Oncology) [name], MD as Consulting Physician (General Surgery) [name], RPH-CPP as Pharmacist (Hematology and Oncology) [name], [name], NP as Nurse Practitioner (Hospice and Palliative Medicine)   INTERVAL HISTORY: Julia [name] is a 58 y.o. 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 Palliative asked to see for symptom and pain management and goals of care.   SOCIAL HISTORY:     reports that she has been smoking cigarettes. She has a 62 pack-year smoking history. She has never used smokeless tobacco. She reports that she does not drink alcohol and does not use drugs.  ADVANCE DIRECTIVES:  None on file   CODE STATUS: Full code  PAST MEDICAL HISTORY: Past Medical History:  Diagnosis Date   Acute bronchitis 04/19/2021   Anxiety    over cancer diagnosis   Asthma    bronchial asthma    Cancer (HCC) 09/2019   left breast invasive mammary cancer   Chronic back pain    COPD (chronic obstructive pulmonary disease) (HCC)    smokes 1 1/2 ppd   Diabetes mellitus without complication (HCC)    GERD (gastroesophageal reflux disease)    Personal history of chemotherapy    Personal history of radiation therapy    Pneumonia    hx of 2018    Smoking     ALLERGIES:  is allergic to propoxyphene  n-acetaminophen , nabumetone, and rofecoxib.  MEDICATIONS:  Current Outpatient Medications  Medication Sig Dispense Refill   anastrozole  (ARIMIDEX ) 1 MG tablet TAKE 1 TABLET BY MOUTH EVERY DAY 90 tablet 3   calcium -vitamin D (OSCAL WITH D) 500-5 MG-MCG tablet Take 1 tablet by mouth daily. 30 tablet 5   famotidine  (PEPCID ) 20 MG tablet TAKE 1 TABLET BY MOUTH TWICE A DAY 180 tablet 1   gabapentin  (NEURONTIN ) 300 MG capsule Take 1 capsule (300 mg total) by mouth 3 (three) times daily. 180 capsule 3   hyaluronate sodium (RADIAPLEXRX) GEL Apply topically to affected area daily as directed. 170 g 3   ibuprofen  (ADVIL ) 800 MG tablet TAKE 1 TABLET BY MOUTH EVERY 8 HOURS AS NEEDED 90 tablet 1   omeprazole  (PRILOSEC ) 20 MG capsule TAKE 1 CAPSULE BY MOUTH EVERY OTHER DAY 45 capsule 3   ondansetron  (ZOFRAN ) 8 MG tablet TAKE 1 TABLET BY MOUTH EVERY 8 HOURS AS NEEDED FOR NAUSEA OR VOMITING. 20 tablet 3   oxyCODONE  (OXYCONTIN ) 15 mg 12 hr tablet Take 1 tablet (15 mg total) by mouth every 12 (twelve) hours. 60 tablet 0   oxyCODONE -acetaminophen  (PERCOCET) 7.5-325 MG tablet Take 1 tablet by mouth every 4 (four) hours as needed for severe pain (pain score 7-10). 90 tablet 0   polyethylene glycol powder (MIRALAX ) 17 GM/SCOOP powder Mix 17 g in 4 oz of liquid and take by mouth daily. 476 g 0   SUMAtriptan  (IMITREX ) 50 MG tablet Take 1 tablet (50 mg total) by mouth every 2 (two) hours as needed for  migraine (max 2 doses in 24 hours.). May repeat in 2 hours if headache persists or recurs. 10 tablet 2   tiZANidine  (ZANAFLEX ) 2 MG tablet Take 1 tablet (2 mg total) by mouth every 8 (eight) hours as needed for muscle spasms. 30 tablet 3   VENTOLIN  HFA 108 (90 Base) MCG/ACT inhaler TAKE 2 PUFFS BY MOUTH EVERY 6 HOURS AS NEEDED FOR WHEEZE OR SHORTNESS OF BREATH 18 each 1   VERZENIO  100 MG tablet TAKE 1 TABLET BY MOUTH 2 TIMES A DAY 56 tablet 3   No current facility-administered medications for this visit.    VITAL  SIGNS: Wt 132 lb 9.6 oz (60.1 kg)   BMI 22.76 kg/m  Filed Weights   02/17/24 1028  Weight: 132 lb 9.6 oz (60.1 kg)       Estimated body mass index is 22.76 kg/m as calculated from the following:   Height as of 11/26/23: 5' 4 (1.626 m).   Weight as of this encounter: 132 lb 9.6 oz (60.1 kg).   PERFORMANCE STATUS (ECOG) : 1 - Symptomatic but completely ambulatory  Assessment NAD RRR Normal breathing pattern AAO x3   IMPRESSION: Discussed the use of AI scribe software for clinical note transcription with the patient, who gave verbal consent to proceed.  History of Present Illness Julia [name] is a 58 year old female who presented to clinic for symptom management follow-up. Denies concerns with nausea, vomiting, or diarrhea. Occasional fatigue. Her appetite continues to improve. Her appetite is doing much better. Current weight is stable at 132lbs.   She reports ongoing issues with pain management and finds herself needing to take her pain medication more frequently. She takes extended-release pain medication in the mornings and before bed. Despite this regimen, she finds herself needing an breakthrough medication more frequently over past several weeks compared to minimal use previously. Her regimen includes gabapentin  300 mg 3 times a day, OxyContin  10 mg every 12 hours and Percocet 7.5 mg every 6 hours as needed. Will increase OxyContin  to 15mg  every 12 hours. Will continue to closely monitor.   She has experienced constipation, which was resolved with the use of Miralax . She now takes it regularly in her coffee every morning and reports being regular without simultaneous bowel movements during urination.  All questions answered and support provided.   I discussed the importance of continued conversation with family and their medical providers regarding overall plan of care and treatment options, ensuring decisions are within the context of the patients values and  GOCs. Assessment & Plan Neoplasm-related pain management in metastatic breast cancer Chronic pain management in the context of metastatic breast cancer. Current regimen includes extended-release oxycodone  (OxyContin ) and immediate-release oxycodone . Reports worsening pain despite current regimen over the past several weeks. Will continue to closely monitor.  - Increased OxyContin  to 15 mg every 12 hours. - Instructed to take current OxyContin  10 mg every 8 hours until new prescription is filled. - Continue oxycodone  7.5 mg every 4-6 hours as needed.   Palliative care for advanced breast cancer Ongoing palliative care for advanced breast cancer. Reports constipation, managed with Urilax, and variable appetite with a recent weight loss of 2 pounds. No significant diarrhea reported. Pain management is a primary focus of care. - Continue Miralax  for constipation management.  Follow-up care coordination Follow-up care coordination discussed, including upcoming appointments and injections. - Advise to contact scheduler to align appointments with injection dates  Patient expressed understanding and was in agreement with this plan.  She also understands that She can call the clinic at any time with any questions, concerns, or complaints.   Any controlled substances utilized were prescribed in the context of palliative care. PDMP has been reviewed.   Visit consisted of counseling and education dealing with the complex and emotionally intense issues of symptom management and palliative care in the setting of serious and potentially life-threatening illness.  Levon Borer, AGPCNP-BC  Palliative Medicine Team/[facility]

## 2024-02-19 ENCOUNTER — Inpatient Hospital Stay

## 2024-02-19 ENCOUNTER — Other Ambulatory Visit: Payer: Self-pay | Admitting: Hematology and Oncology

## 2024-03-07 ENCOUNTER — Other Ambulatory Visit: Payer: Self-pay | Admitting: Nurse Practitioner

## 2024-03-07 DIAGNOSIS — C50919 Malignant neoplasm of unspecified site of unspecified female breast: Secondary | ICD-10-CM

## 2024-03-07 DIAGNOSIS — C50412 Malignant neoplasm of upper-outer quadrant of left female breast: Secondary | ICD-10-CM

## 2024-03-07 DIAGNOSIS — G893 Neoplasm related pain (acute) (chronic): Secondary | ICD-10-CM

## 2024-03-07 DIAGNOSIS — Z515 Encounter for palliative care: Secondary | ICD-10-CM

## 2024-03-07 MED ORDER — OXYCODONE HCL ER 15 MG PO T12A: 15.0000 mg | EXTENDED_RELEASE_TABLET | Freq: Two times a day (BID) | ORAL | 0 refills | Status: DC

## 2024-03-07 MED ORDER — OXYCODONE-ACETAMINOPHEN 7.5-325 MG PO TABS: 1.0000 | ORAL_TABLET | ORAL | 0 refills | Status: DC | PRN

## 2024-03-12 ENCOUNTER — Other Ambulatory Visit: Payer: Self-pay | Admitting: Hematology and Oncology

## 2024-03-23 ENCOUNTER — Other Ambulatory Visit (HOSPITAL_COMMUNITY): Payer: Self-pay

## 2024-03-23 ENCOUNTER — Encounter: Payer: Self-pay | Admitting: Nurse Practitioner

## 2024-03-23 ENCOUNTER — Inpatient Hospital Stay (HOSPITAL_BASED_OUTPATIENT_CLINIC_OR_DEPARTMENT_OTHER): Admitting: Nurse Practitioner

## 2024-03-23 ENCOUNTER — Inpatient Hospital Stay: Attending: Hematology and Oncology

## 2024-03-23 VITALS — BP 132/55 | HR 64 | Temp 99.1°F | Resp 18

## 2024-03-23 DIAGNOSIS — Z170 Estrogen receptor positive status [ER+]: Secondary | ICD-10-CM

## 2024-03-23 DIAGNOSIS — Z515 Encounter for palliative care: Secondary | ICD-10-CM | POA: Insufficient documentation

## 2024-03-23 DIAGNOSIS — C50412 Malignant neoplasm of upper-outer quadrant of left female breast: Secondary | ICD-10-CM

## 2024-03-23 DIAGNOSIS — C50919 Malignant neoplasm of unspecified site of unspecified female breast: Secondary | ICD-10-CM | POA: Diagnosis not present

## 2024-03-23 DIAGNOSIS — Z79899 Other long term (current) drug therapy: Secondary | ICD-10-CM | POA: Diagnosis not present

## 2024-03-23 DIAGNOSIS — C7951 Secondary malignant neoplasm of bone: Secondary | ICD-10-CM

## 2024-03-23 DIAGNOSIS — F17210 Nicotine dependence, cigarettes, uncomplicated: Secondary | ICD-10-CM

## 2024-03-23 DIAGNOSIS — G893 Neoplasm related pain (acute) (chronic): Secondary | ICD-10-CM | POA: Insufficient documentation

## 2024-03-23 MED ORDER — OXYCODONE HCL ER 15 MG PO T12A: 15.0000 mg | EXTENDED_RELEASE_TABLET | Freq: Two times a day (BID) | ORAL | 0 refills | Status: DC

## 2024-03-23 MED ORDER — OXYCODONE-ACETAMINOPHEN 7.5-325 MG PO TABS: 1.0000 | ORAL_TABLET | ORAL | 0 refills | Status: DC | PRN

## 2024-03-23 MED ORDER — OXYCODONE-ACETAMINOPHEN 7.5-325 MG PO TABS
1.0000 | ORAL_TABLET | ORAL | 0 refills | Status: DC | PRN
  Filled 2024-03-23: qty 90, 15d supply, fill #0

## 2024-03-23 MED ORDER — FULVESTRANT 250 MG/5ML IM SOSY
500.0000 mg | PREFILLED_SYRINGE | Freq: Once | INTRAMUSCULAR | Status: AC
  Administered 2024-03-23: 12:00:00 500 mg via INTRAMUSCULAR
  Filled 2024-03-23: qty 10

## 2024-03-23 NOTE — Addendum Note (Signed)
 Addended by: [name], Mirna Sutcliffe N on: 03/23/2024 05:08 PM   Modules accepted: Orders

## 2024-03-23 NOTE — Progress Notes (Signed)
 Palliative Medicine [facility]  Telephone:(336) [phone] Fax:(336) [phone]   Name: Julia [name] Date: 03/23/2024 MRN: [id number]  DOB: 1966-03-21  Patient Care Team: [name], MD as PCP - General (Family Medicine) [name], RN as Registered Nurse [name], RN as Registered Nurse [name], RN as Oncology Nurse Navigator [name], MD as Consulting Physician (Radiation Oncology) [name], MD as Consulting Physician (Hematology and Oncology) [name], MD as Consulting Physician (General Surgery) [name], RPH-CPP as Pharmacist (Hematology and Oncology) [name], [name], NP as Nurse Practitioner (Hospice and Palliative Medicine)   INTERVAL HISTORY: Julia [name] is a 58 y.o. 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 Palliative asked to see for symptom and pain management and goals of care.   SOCIAL HISTORY:     reports that she has been smoking cigarettes. She has a 62 pack-year smoking history. She has never used smokeless tobacco. She reports that she does not drink alcohol and does not use drugs.  ADVANCE DIRECTIVES:  None on file   CODE STATUS: Full code  PAST MEDICAL HISTORY: Past Medical History:  Diagnosis Date   Acute bronchitis 04/19/2021   Anxiety    over cancer diagnosis   Asthma    bronchial asthma    Cancer (HCC) 09/2019   left breast invasive mammary cancer   Chronic back pain    COPD (chronic obstructive pulmonary disease) (HCC)    smokes 1 1/2 ppd   Diabetes mellitus without complication (HCC)    GERD (gastroesophageal reflux disease)    Personal history of chemotherapy    Personal history of radiation therapy    Pneumonia    hx of 2018    Smoking     ALLERGIES:  is allergic to propoxyphene  n-acetaminophen , nabumetone, and rofecoxib.  MEDICATIONS:  Current Outpatient Medications  Medication Sig Dispense Refill   anastrozole  (ARIMIDEX ) 1 MG tablet TAKE 1 TABLET BY MOUTH EVERY DAY 90 tablet 3   calcium -vitamin D (OSCAL WITH D) 500-5 MG-MCG tablet Take 1 tablet by mouth daily. 30 tablet 5   famotidine  (PEPCID ) 20 MG tablet TAKE 1 TABLET BY MOUTH TWICE A DAY 180 tablet 1   gabapentin  (NEURONTIN ) 300 MG capsule Take 1 capsule (300 mg total) by mouth 3 (three) times daily. 180 capsule 3   hyaluronate sodium (RADIAPLEXRX) GEL Apply topically to affected area daily as directed. 170 g 3   ibuprofen  (ADVIL ) 800 MG tablet TAKE 1 TABLET BY MOUTH EVERY 8 HOURS AS NEEDED 90 tablet 1   omeprazole  (PRILOSEC ) 20 MG capsule TAKE 1 CAPSULE BY MOUTH EVERY OTHER DAY 45 capsule 3   ondansetron  (ZOFRAN ) 8 MG tablet TAKE 1 TABLET BY MOUTH EVERY 8 HOURS AS NEEDED FOR NAUSEA OR VOMITING. 20 tablet 3   oxyCODONE  (OXYCONTIN ) 15 mg 12 hr tablet Take 1 tablet (15 mg total) by mouth every 12 (twelve) hours. 60 tablet 0   oxyCODONE -acetaminophen  (PERCOCET) 7.5-325 MG tablet Take 1 tablet by mouth every 4 (four) hours as needed for severe pain (pain score 7-10). 90 tablet 0   polyethylene glycol powder (MIRALAX ) 17 GM/SCOOP powder Mix 17 g in 4 oz of liquid and take by mouth daily. 476 g 0   SUMAtriptan  (IMITREX ) 50 MG tablet Take 1 tablet (50 mg total) by mouth every 2 (two) hours as needed for  migraine (max 2 doses in 24 hours.). May repeat in 2 hours if headache persists or recurs. 10 tablet 2   tiZANidine  (ZANAFLEX ) 2 MG tablet Take 1 tablet (2 mg total) by mouth every 8 (eight) hours as needed for muscle spasms. 30 tablet 3   VENTOLIN  HFA 108 (90 Base) MCG/ACT inhaler TAKE 2 PUFFS BY MOUTH EVERY 6 HOURS AS NEEDED FOR WHEEZE OR SHORTNESS OF BREATH 18 each 1   VERZENIO  100 MG tablet TAKE 1 TABLET BY MOUTH 2 TIMES A DAY 56 tablet 3   No current facility-administered medications for this visit.    VITAL  SIGNS: There were no vitals taken for this visit. There were no vitals filed for this visit.   Estimated body mass index is 22.76 kg/m as calculated from the following:   Height as of 11/26/23: 5' 4 (1.626 m).   Weight as of 02/17/24: 132 lb 9.6 oz (60.1 kg).   PERFORMANCE STATUS (ECOG) : 1 - Symptomatic but completely ambulatory  Assessment NAD RRR Normal breathing pattern AAO x3  IMPRESSION: Discussed the use of AI scribe software for clinical note transcription with the patient, who gave verbal consent to proceed.  History of Present Illness Julia [name] is a 58 year old female who presented to clinic for symptom management follow-up. Denies concerns with nausea, vomiting, or diarrhea. Occasional fatigue. Her appetite continues to improve. Current weight is stable at 132lbs.   She has been experiencing hair thinning, which she noticed after her hair grew back thicker previously. She is currently taking Verzenio  and suspects it might be contributing to the hair thinning. Despite trying various shampoos and treatments, she has not seen improvement.  We discussed use of hair care oils and treatments to decrease thinning and enhance thickening.  We discussed Julia [name]'s pain at length.  Reports current regimen is managing her pain effectively.  She is currently taking gabapentin  300 mg 3 times a day, OxyContin  15 mg every 12 hours and Percocet 7.5 mg every 6 hours as needed.  No adjustments at this time.  Will continue to closely monitor.   All questions answered and support provided.   I discussed the importance of continued conversation with family and their medical providers regarding overall plan of care and treatment options, ensuring decisions are within the context of the patients values and GOCs. Assessment & Plan Neoplasm-related pain management in metastatic breast cancer Pain is well-managed on current regimen.  Current regimen includes extended-release oxycodone   (OxyContin ) and immediate-release oxycodone .  - Continue OxyContin  15 mg every 12 hours. - Continue oxycodone  7.5 mg every 4-6 hours as needed.   Palliative care for advanced breast cancer Ongoing palliative care for advanced breast cancer. Reports constipation, managed with Urilax, and variable appetite with a recent weight loss of 2 pounds. No significant diarrhea reported. Pain management is a primary focus of care. - Continue Miralax  for constipation management.  Follow-up care coordination Follow-up care coordination discussed, including upcoming appointments and injections. - Advise to contact scheduler to align appointments with injection dates  Palliative care management for metastatic breast cancer Hair thinning potentially related to Verzenio . Appetite is good, and weight is stable at 132 lbs. Lab work scheduled for January to assess for vitamin deficiencies. - Use wild hair growth oil for hair thinning. - Consider biotin gummies for hair health. - Scheduled lab work for January. - Coordinated lab work and appointments on the same day for convenience.  I will plan to see patient back in  3-4 weeks. Sooner if needed.   Patient expressed understanding and was in agreement with this plan. She also understands that She can call the clinic at any time with any questions, concerns, or complaints.   Any controlled substances utilized were prescribed in the context of palliative care. PDMP has been reviewed.   Visit consisted of counseling and education dealing with the complex and emotionally intense issues of symptom management and palliative care in the setting of serious and potentially life-threatening illness.  Levon Borer, AGPCNP-BC  Palliative Medicine Team/[facility]

## 2024-03-28 ENCOUNTER — Other Ambulatory Visit: Payer: Self-pay | Admitting: Nurse Practitioner

## 2024-04-11 ENCOUNTER — Other Ambulatory Visit: Payer: Self-pay | Admitting: Family Medicine

## 2024-04-14 ENCOUNTER — Other Ambulatory Visit: Payer: Self-pay

## 2024-04-14 NOTE — Progress Notes (Signed)
 Pt called because she had no CT scheduled. RN called pt and transferred her to central scheduling and instructed her to schedule the appt before her f/u on 1/21. Pt verbalized understanding.

## 2024-04-18 ENCOUNTER — Ambulatory Visit: Admitting: Family Medicine

## 2024-04-19 ENCOUNTER — Other Ambulatory Visit: Payer: Self-pay

## 2024-04-19 ENCOUNTER — Other Ambulatory Visit (HOSPITAL_COMMUNITY): Payer: Self-pay

## 2024-04-19 DIAGNOSIS — C50919 Malignant neoplasm of unspecified site of unspecified female breast: Secondary | ICD-10-CM

## 2024-04-19 DIAGNOSIS — Z515 Encounter for palliative care: Secondary | ICD-10-CM

## 2024-04-19 DIAGNOSIS — C50412 Malignant neoplasm of upper-outer quadrant of left female breast: Secondary | ICD-10-CM

## 2024-04-19 DIAGNOSIS — G893 Neoplasm related pain (acute) (chronic): Secondary | ICD-10-CM

## 2024-04-19 MED ORDER — OXYCODONE-ACETAMINOPHEN 7.5-325 MG PO TABS
1.0000 | ORAL_TABLET | ORAL | 0 refills | Status: DC | PRN
  Filled 2024-04-19: qty 90, 15d supply, fill #0

## 2024-04-20 ENCOUNTER — Inpatient Hospital Stay: Attending: Hematology and Oncology

## 2024-04-20 DIAGNOSIS — Z170 Estrogen receptor positive status [ER+]: Secondary | ICD-10-CM

## 2024-04-20 LAB — CBC WITH DIFFERENTIAL (CANCER CENTER ONLY)
Abs Immature Granulocytes: 0.04 K/uL (ref 0.00–0.07)
Basophils Absolute: 0 K/uL (ref 0.0–0.1)
Basophils Relative: 1 %
Eosinophils Absolute: 0.2 K/uL (ref 0.0–0.5)
Eosinophils Relative: 2 %
HCT: 39.2 % (ref 36.0–46.0)
Hemoglobin: 14.1 g/dL (ref 12.0–15.0)
Immature Granulocytes: 1 %
Lymphocytes Relative: 36 %
Lymphs Abs: 2.3 K/uL (ref 0.7–4.0)
MCH: 36.1 pg — ABNORMAL HIGH (ref 26.0–34.0)
MCHC: 36 g/dL (ref 30.0–36.0)
MCV: 100.3 fL — ABNORMAL HIGH (ref 80.0–100.0)
Monocytes Absolute: 0.3 K/uL (ref 0.1–1.0)
Monocytes Relative: 5 %
Neutro Abs: 3.5 K/uL (ref 1.7–7.7)
Neutrophils Relative %: 55 %
Platelet Count: 185 K/uL (ref 150–400)
RBC: 3.91 MIL/uL (ref 3.87–5.11)
RDW: 13.1 % (ref 11.5–15.5)
WBC Count: 6.3 K/uL (ref 4.0–10.5)
nRBC: 0 % (ref 0.0–0.2)

## 2024-04-20 LAB — CMP (CANCER CENTER ONLY)
ALT: 6 U/L (ref 0–44)
AST: 13 U/L — ABNORMAL LOW (ref 15–41)
Albumin: 4.3 g/dL (ref 3.5–5.0)
Alkaline Phosphatase: 54 U/L (ref 38–126)
Anion gap: 13 (ref 5–15)
BUN: 12 mg/dL (ref 6–20)
CO2: 26 mmol/L (ref 22–32)
Calcium: 9.7 mg/dL (ref 8.9–10.3)
Chloride: 99 mmol/L (ref 98–111)
Creatinine: 0.96 mg/dL (ref 0.44–1.00)
GFR, Estimated: >60 mL/min
Glucose, Bld: 118 mg/dL — ABNORMAL HIGH (ref 70–99)
Potassium: 4.3 mmol/L (ref 3.5–5.1)
Sodium: 138 mmol/L (ref 135–145)
Total Bilirubin: 0.3 mg/dL (ref 0.0–1.2)
Total Protein: 7.3 g/dL (ref 6.5–8.1)

## 2024-04-21 LAB — CANCER ANTIGEN 27.29: CA 27.29: 90.3 U/mL — ABNORMAL HIGH (ref 0.0–38.6)

## 2024-04-22 ENCOUNTER — Ambulatory Visit (HOSPITAL_COMMUNITY)
Admission: RE | Admit: 2024-04-22 | Discharge: 2024-04-22 | Disposition: A | Discharge status: Home / Self Care | Source: Ambulatory Visit | Attending: Hematology and Oncology | Admitting: Hematology and Oncology

## 2024-04-22 DIAGNOSIS — C50412 Malignant neoplasm of upper-outer quadrant of left female breast: Secondary | ICD-10-CM | POA: Insufficient documentation

## 2024-04-22 DIAGNOSIS — Z170 Estrogen receptor positive status [ER+]: Secondary | ICD-10-CM | POA: Insufficient documentation

## 2024-04-22 MED ORDER — IOHEXOL 300 MG/ML  SOLN
100.0000 mL | Freq: Once | INTRAMUSCULAR | Status: AC | PRN
  Administered 2024-04-22: 100 mL via INTRAVENOUS

## 2024-04-26 NOTE — Assessment & Plan Note (Signed)
 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  ER 90%, PR 90%, HER-2 negative by FISH Posterior margin positive but no further surgery done because it is muscle on the back. T2N0 stage Ib Oncotype DX score 32: 20% risk of distant recurrence in 9 years   Treatment plan:  1. Adjuvant chemotherapy with dose dense Adriamycin  and Cytoxan  x4 followed by Taxol  weekly x12 completed 03/27/21 2. followed by radiation therapy started 04/26/20 3.  Followed by adjuvant antiestrogen therapy.  Started February 2022 ------------------------------------------------------------------------------------------------------------   Right neck swelling CT neck 11/12/2022: 9 mm level 5 lymph node, biopsy: Metastatic carcinoma consistent with breast primary, ER 95%, PR 80%, HER2 0-1+   Recommendation: PET CT scan 01/30/2023: 10 mm cervical LN, scattered pulm nodules below level of PET, Multiple bone mets Current treatment: Verzinio along with Faslodex  started 02/04/2023   Abemaciclib  toxicities:  Fatigue which is preventing her from staying as active as she used to be.    GI toxicities: Abdominal pain, nausea vomiting and diarrhea: Resolved after stopping Verzenio  I reduced the dosage of Verzinio to 100 mg p.o. twice daily and sent a new prescription. She will start taking this as soon as the medicine arrives.   Bone metastasis: On Xgeva  along with calcium  and vitamin D CT CAP 04/30/23: Bone mets more sclerotic (suggestive of response), lung nodules stable. CT abdomen pelvis 07/27/2023: Bone metastases stable CT CAP 12/30/2023: Bilateral pulmonary nodules some have minimally increased in size others are stable, overall increased sclerosis and the widespread bone metastases, no evidence of progression   Because of the increase in the nodularity, I recommended a short-term follow-up CT scan  CT CAP 04/22/2024:   Return to clinic in 3  months for Xgeva 

## 2024-04-27 ENCOUNTER — Inpatient Hospital Stay: Admitting: Hematology and Oncology

## 2024-04-27 ENCOUNTER — Inpatient Hospital Stay: Admitting: Nurse Practitioner

## 2024-04-27 ENCOUNTER — Inpatient Hospital Stay

## 2024-04-27 VITALS — BP 118/76 | HR 73 | Temp 98.3°F | Resp 18 | Ht 64.0 in | Wt 132.0 lb

## 2024-04-27 DIAGNOSIS — C50919 Malignant neoplasm of unspecified site of unspecified female breast: Secondary | ICD-10-CM

## 2024-04-27 DIAGNOSIS — Z170 Estrogen receptor positive status [ER+]: Secondary | ICD-10-CM

## 2024-04-27 DIAGNOSIS — C50412 Malignant neoplasm of upper-outer quadrant of left female breast: Secondary | ICD-10-CM

## 2024-04-27 MED ORDER — FULVESTRANT 250 MG/5ML IM SOSY
500.0000 mg | PREFILLED_SYRINGE | Freq: Once | INTRAMUSCULAR | Status: AC
  Administered 2024-04-27: 500 mg via INTRAMUSCULAR
  Filled 2024-04-27: qty 10

## 2024-04-27 MED ORDER — DENOSUMAB 120 MG/1.7ML ~~LOC~~ SOLN
120.0000 mg | Freq: Once | SUBCUTANEOUS | Status: AC
  Administered 2024-04-27: 120 mg via SUBCUTANEOUS
  Filled 2024-04-27: qty 1.7

## 2024-04-27 NOTE — Progress Notes (Signed)
 "  Patient Care Team: [name], MD as PCP - General (Family Medicine) [name], RN as Registered Nurse [name], RN as Registered Nurse [name], RN as Oncology Nurse Navigator [name], MD as Consulting Physician (Radiation Oncology) [name], MD as Consulting Physician (Hematology and Oncology) [name], MD as Consulting Physician (General Surgery) [name], RPH-CPP as Pharmacist (Hematology and Oncology) [name], [name], NP as Nurse Practitioner [facility] and Palliative Medicine)  DIAGNOSIS:  Encounter Diagnosis  Name Primary?   Malignant neoplasm of upper-outer quadrant of left breast in female, estrogen receptor positive (HCC) Yes    SUMMARY OF ONCOLOGIC HISTORY: Oncology History  Malignant neoplasm of upper-outer quadrant of left breast in female, estrogen receptor positive (HCC)  08/25/2019 Initial Diagnosis   Palpable left breast mass x1 month. Diagnostic mammogram and US  on 08/18/19 showed a 2.8cm mass at the 2 o'clock position, and one lymph node with mild cortical thickening in the left axilla. Biopsy on 08/25/19 showed invasive mammary carcinoma in the breast, grade 3, HER-2 equivocal by IHC (2+) negative by FISH, ER+ >90%, PR+ >90%, left axilla negative   10/07/2019 Surgery   Left lumpectomy and sentinel lymph node biopsy [facility]) [phone]): Grade 3 IDC with DCIS, 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   10/18/2019 Cancer Staging   Staging form: Breast, AJCC 8th Edition - Pathologic stage from 10/18/2019: Stage IB (pT2, pN0(sn), cM0, G3, ER+, PR+, HER2-)   10/21/2019 Oncotype testing   The Oncotype DX score was 32 predicting a risk of outside the breast recurrence over the next 9 years of 20% if the patient's only systemic therapy is tamoxifen for 5 years.    11/15/2019 - 03/27/2020 Chemotherapy   Adjuvant chemotherapy with dose dense Adriamycin  and Cytoxan  x4 (11/15/2019 - 12/27/2019)  followed by Taxol  weekly x12 (01/09/2020 - 03/27/20).   04/25/2020 - 05/25/2020 Radiation Therapy   The patient initially received a dose of 42.56 Gy in 16 fractions to the breast using whole-breast tangent fields. This was delivered using a 3-D conformal technique. The pt received a boost delivering an additional 8 Gy in 4 fractions using a electron boost with electrons. The total dose was 50.56 Gy.   05/2020 -  Anti-estrogen oral therapy   Anastrozole    05/06/2021 Surgery   Left mastectomy: Benign (performed because of left breast fibrosis and pain)     CHIEF COMPLIANT: Follow-up on Verzenio  and fulvestrant   HISTORY OF PRESENT ILLNESS:   History of Present Illness Julia [name] is a 59 year old female with estrogen receptor-positive metastatic breast cancer to bone who presents for routine oncology follow-up and treatment.  She is on abemaciclib  100 mg BID, monthly fulvestrant , and denosumab  for bone protection.  She has daily nausea and epigastric discomfort, worst around 6 PM before supper, and uses PRN antiemetics. She attributes symptoms to eating. She has had no diarrhea since the abemaciclib  dose reduction. She is anxious about and dislikes monthly injections, preferring to complete them in one session, and reports injection site pain lasting about a month.  She had insomnia from anxiety about scan results before this visit. Anxiety improved after discussion of favorable imaging.       ALLERGIES:  is allergic to propoxyphene n-acetaminophen , nabumetone, and rofecoxib.  MEDICATIONS:  Current Outpatient Medications  Medication Sig Dispense Refill   anastrozole  (ARIMIDEX ) 1 MG tablet TAKE 1 TABLET BY MOUTH EVERY DAY 90 tablet 3   calcium -vitamin D (OSCAL WITH D)  500-5 MG-MCG tablet Take 1 tablet by mouth daily. 30 tablet 5   famotidine  (PEPCID ) 20 MG tablet TAKE 1 TABLET BY MOUTH TWICE A DAY 180 tablet 1   gabapentin  (NEURONTIN ) 300 MG capsule Take 1 capsule (300 mg  total) by mouth 3 (three) times daily. 180 capsule 3   hyaluronate sodium (RADIAPLEXRX) GEL Apply topically to affected area daily as directed. 170 g 3   ibuprofen  (ADVIL ) 800 MG tablet TAKE 1 TABLET BY MOUTH EVERY 8 HOURS AS NEEDED 90 tablet 1   omeprazole  (PRILOSEC ) 20 MG capsule TAKE 1 CAPSULE BY MOUTH EVERY OTHER DAY 45 capsule 3   ondansetron  (ZOFRAN ) 8 MG tablet TAKE 1 TABLET BY MOUTH EVERY 8 HOURS AS NEEDED FOR NAUSEA OR VOMITING. 20 tablet 3   oxyCODONE  (OXYCONTIN ) 15 mg 12 hr tablet Take 1 tablet (15 mg total) by mouth every 12 (twelve) hours. 60 tablet 0   oxyCODONE -acetaminophen  (PERCOCET) 7.5-325 MG tablet Take 1 tablet by mouth every 4 (four) hours as needed for severe pain (pain score 7-10). 90 tablet 0   polyethylene glycol powder (MIRALAX ) 17 GM/SCOOP powder Mix 17 g in 4 oz of liquid and take by mouth daily. 476 g 0   SUMAtriptan  (IMITREX ) 50 MG tablet Take 1 tablet (50 mg total) by mouth every 2 (two) hours as needed for migraine (max 2 doses in 24 hours.). May repeat in 2 hours if headache persists or recurs. 10 tablet 2   tiZANidine  (ZANAFLEX ) 2 MG tablet Take 1 tablet (2 mg total) by mouth every 8 (eight) hours as needed for muscle spasms. 30 tablet 3   VENTOLIN  HFA 108 (90 Base) MCG/ACT inhaler TAKE 2 PUFFS BY MOUTH EVERY 6 HOURS AS NEEDED FOR WHEEZE OR SHORTNESS OF BREATH 18 each 1   VERZENIO  100 MG tablet TAKE 1 TABLET BY MOUTH 2 TIMES A DAY 56 tablet 3   No current facility-administered medications for this visit.    PHYSICAL EXAMINATION: ECOG PERFORMANCE STATUS: 1 - Symptomatic but completely ambulatory  There were no vitals filed for this visit. There were no vitals filed for this visit.  Physical Exam   (exam performed in the presence of a chaperone)  LABORATORY DATA:  I have reviewed the data as listed    Latest Ref Rng & Units 04/20/2024    8:43 AM 02/02/2024    9:34 AM 01/19/2024    9:22 AM  CMP  Glucose 70 - 99 mg/dL 881  899  95   BUN 6 - 20 mg/dL 12   7  14    Creatinine 0.44 - 1.00 mg/dL 9.03  9.26  9.20   Sodium 135 - 145 mmol/L 138  139  137   Potassium 3.5 - 5.1 mmol/L 4.3  4.3  4.1   Chloride 98 - 111 mmol/L 99  108  104   CO2 22 - 32 mmol/L 26  25  27    Calcium  8.9 - 10.3 mg/dL 9.7  8.7  9.7   Total Protein 6.5 - 8.1 g/dL 7.3  6.5  6.9   Total Bilirubin 0.0 - 1.2 mg/dL 0.3  0.2  0.3   Alkaline Phos 38 - 126 U/L 54  42  45   AST 15 - 41 U/L 13  <10  <10   ALT 0 - 44 U/L 6  <5  5     Lab Results  Component Value Date   WBC 6.3 04/20/2024   HGB 14.1 04/20/2024   HCT 39.2 04/20/2024  MCV 100.3 (H) 04/20/2024   PLT 185 04/20/2024   NEUTROABS 3.5 04/20/2024    ASSESSMENT & PLAN:  Malignant neoplasm of upper-outer quadrant of left breast in female, estrogen receptor positive (HCC) 10/07/2019:Left lumpectomy and sentinel lymph node biopsy [facility]): IDC with DCIS, 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  ER 90%, PR 90%, HER-2 negative by FISH Posterior margin positive but no further surgery done because it is muscle on the back. T2N0 stage Ib Oncotype DX score 32: 20% risk of distant recurrence in 9 years   Treatment plan:  1. Adjuvant chemotherapy with dose dense Adriamycin  and Cytoxan  x4 followed by Taxol  weekly x12 completed 03/27/21 2. followed by radiation therapy started 04/26/20 3.  Followed by adjuvant antiestrogen therapy.  Started February 2022 ------------------------------------------------------------------------------------------------------------   Right neck swelling CT neck 11/12/2022: 9 mm level 5 lymph node, biopsy: Metastatic carcinoma consistent with breast primary, ER 95%, PR 80%, HER2 0-1+   Recommendation: PET CT scan 01/30/2023: 10 mm cervical LN, scattered pulm nodules below level of PET, Multiple bone mets Current treatment: Verzinio along with Faslodex  started 02/04/2023   Abemaciclib  toxicities:  Fatigue which is preventing her from staying as active as she used to be.    GI  toxicities: Abdominal pain, nausea vomiting and diarrhea: Resolved after stopping Verzenio  I reduced the dosage of Verzinio to 100 mg p.o. twice daily and sent a new prescription. She will start taking this as soon as the medicine arrives.   Bone metastasis: On Xgeva  along with calcium  and vitamin D CT CAP 04/30/23: Bone mets more sclerotic (suggestive of response), lung nodules stable. CT abdomen pelvis 07/27/2023: Bone metastases stable CT CAP 12/30/2023: Bilateral pulmonary nodules some have minimally increased in size others are stable, overall increased sclerosis and the widespread bone metastases, no evidence of progression   Because of the increase in the nodularity, I recommended a short-term follow-up CT scan  CT CAP 04/22/2024: Stable widespread bone metastases.  Stable radiation changes left lung, stable scattered lung nodules   Return to clinic in 3 months for Xgeva     Assessment & Plan Estrogen receptor positive metastatic breast cancer to bone Imaging showed no new lesions, improved bone metastases, and no visceral organ involvement. Stable lab parameters and tumor markers. Tolerated current therapy with mild nausea, no diarrhea. Clinically stable. - Continued anastrozole . - Maintained routine lab surveillance: CBC, hepatic/renal function, tumor markers. - Planned repeat imaging in six months; follow-up in three months for evaluation, monthly for injections.      No orders of the defined types were placed in this encounter.  The patient has a good understanding of the overall plan. she agrees with it. she will call with any problems that may develop before the next visit here.  I personally spent a total of 30 minutes in the care of the patient today including preparing to see the patient, getting/reviewing separately obtained history, performing a medically appropriate exam/evaluation, counseling and educating, placing orders, referring and communicating with other health care  professionals, documenting clinical information in the EHR, independently interpreting results, communicating results, and coordinating care.   Viinay K Jaleia Hanke, MD 04/27/24    "

## 2024-04-28 ENCOUNTER — Other Ambulatory Visit: Payer: Self-pay | Admitting: Nurse Practitioner

## 2024-04-28 DIAGNOSIS — G893 Neoplasm related pain (acute) (chronic): Secondary | ICD-10-CM

## 2024-04-28 DIAGNOSIS — C50412 Malignant neoplasm of upper-outer quadrant of left female breast: Secondary | ICD-10-CM

## 2024-04-28 DIAGNOSIS — Z515 Encounter for palliative care: Secondary | ICD-10-CM

## 2024-04-28 DIAGNOSIS — C50919 Malignant neoplasm of unspecified site of unspecified female breast: Secondary | ICD-10-CM

## 2024-04-29 ENCOUNTER — Telehealth: Payer: Self-pay

## 2024-04-29 MED ORDER — OXYCODONE-ACETAMINOPHEN 7.5-325 MG PO TABS
1.0000 | ORAL_TABLET | ORAL | 0 refills | Status: DC | PRN
  Filled 2024-04-29 – 2024-05-02 (×3): qty 90, 15d supply, fill #0

## 2024-04-29 NOTE — Telephone Encounter (Signed)
 Pt called w/ refill request for Percocet. Palliative care NP [facility] manages pain and fills this RX and it is in her work que. RN informed pt that because refill request was submitted after business hours on 1/22 we could not guarantee that [facility] will be able to fill it today. Pt verbalized understanding.

## 2024-04-30 ENCOUNTER — Other Ambulatory Visit (HOSPITAL_COMMUNITY): Payer: Self-pay

## 2024-05-01 ENCOUNTER — Other Ambulatory Visit: Payer: Self-pay

## 2024-05-01 ENCOUNTER — Other Ambulatory Visit (HOSPITAL_COMMUNITY): Payer: Self-pay

## 2024-05-02 ENCOUNTER — Other Ambulatory Visit: Payer: Self-pay

## 2024-05-03 ENCOUNTER — Other Ambulatory Visit: Payer: Self-pay

## 2024-05-04 ENCOUNTER — Encounter: Payer: Self-pay | Admitting: Nurse Practitioner

## 2024-05-04 ENCOUNTER — Inpatient Hospital Stay: Admitting: Nurse Practitioner

## 2024-05-04 ENCOUNTER — Other Ambulatory Visit (HOSPITAL_COMMUNITY): Payer: Self-pay

## 2024-05-04 DIAGNOSIS — C50412 Malignant neoplasm of upper-outer quadrant of left female breast: Secondary | ICD-10-CM

## 2024-05-04 DIAGNOSIS — Z515 Encounter for palliative care: Secondary | ICD-10-CM

## 2024-05-04 DIAGNOSIS — C7951 Secondary malignant neoplasm of bone: Secondary | ICD-10-CM

## 2024-05-04 DIAGNOSIS — F17210 Nicotine dependence, cigarettes, uncomplicated: Secondary | ICD-10-CM

## 2024-05-04 DIAGNOSIS — C50919 Malignant neoplasm of unspecified site of unspecified female breast: Secondary | ICD-10-CM | POA: Diagnosis not present

## 2024-05-04 DIAGNOSIS — G893 Neoplasm related pain (acute) (chronic): Secondary | ICD-10-CM | POA: Diagnosis not present

## 2024-05-04 DIAGNOSIS — Z170 Estrogen receptor positive status [ER+]: Secondary | ICD-10-CM | POA: Diagnosis not present

## 2024-05-04 MED ORDER — OXYCODONE HCL ER 15 MG PO T12A
15.0000 mg | EXTENDED_RELEASE_TABLET | Freq: Two times a day (BID) | ORAL | 0 refills | Status: AC
  Filled 2024-05-04: qty 60, 30d supply, fill #0

## 2024-05-04 NOTE — Progress Notes (Signed)
 "    Palliative Medicine [facility]  [phone]) [phone] [phone]) [phone]   Name: [name] Date: 05/04/2024 MRN: [id number]  DOB: [birth year]  Patient Care Team: [name], MD as PCP - General (Family Medicine) [name], RN as Registered Nurse [name], RN as Registered Nurse [name], RN as Oncology Nurse Navigator [name], MD as Consulting Physician (Radiation Oncology) [name], MD as Consulting Physician (Hematology and Oncology) [name], MD as Consulting Physician (General Surgery) [name], RPH-CPP as Pharmacist (Hematology and Oncology) [name], [name], NP as Nurse Practitioner (Hospice and Palliative Medicine)   INTERVAL HISTORY: Julia [name] is a 59 y.o. 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 Palliative asked to see for symptom and pain management and goals of care.   SOCIAL HISTORY:     reports that she has been smoking cigarettes. She has a 60 pack-year smoking history. She has never used smokeless tobacco. She reports that she does not drink alcohol and does not use drugs.  ADVANCE DIRECTIVES:  None on file   CODE STATUS: Full code  PAST MEDICAL HISTORY: Past Medical History:  Diagnosis Date   Acute bronchitis 04/19/2021   Anxiety    over cancer diagnosis   Asthma    bronchial asthma    Cancer (HCC) 09/2019   left breast invasive mammary cancer   Chronic back pain    COPD (chronic obstructive pulmonary disease) (HCC)    smokes 1 1/2 ppd   Diabetes mellitus without complication (HCC)    GERD (gastroesophageal reflux disease)    Personal history of chemotherapy    Personal history of radiation therapy    Pneumonia    hx of 2018    Smoking     ALLERGIES:  is allergic to propoxyphene  n-acetaminophen , nabumetone, and rofecoxib.  MEDICATIONS:  Current Outpatient Medications  Medication Sig Dispense Refill   anastrozole  (ARIMIDEX ) 1 MG tablet TAKE 1 TABLET BY MOUTH EVERY DAY 90 tablet 3   calcium -vitamin D (OSCAL WITH D) 500-5 MG-MCG tablet Take 1 tablet by mouth daily. 30 tablet 5   famotidine  (PEPCID ) 20 MG tablet TAKE 1 TABLET BY MOUTH TWICE A DAY 180 tablet 1   gabapentin  (NEURONTIN ) 300 MG capsule Take 1 capsule (300 mg total) by mouth 3 (three) times daily. 180 capsule 3   hyaluronate sodium (RADIAPLEXRX) GEL Apply topically to affected area daily as directed. 170 g 3   ibuprofen  (ADVIL ) 800 MG tablet TAKE 1 TABLET BY MOUTH EVERY 8 HOURS AS NEEDED 90 tablet 1   omeprazole  (PRILOSEC ) 20 MG capsule TAKE 1 CAPSULE BY MOUTH EVERY OTHER DAY 45 capsule 3   ondansetron  (ZOFRAN ) 8 MG tablet TAKE 1 TABLET BY MOUTH EVERY 8 HOURS AS NEEDED FOR NAUSEA OR VOMITING. 20 tablet 3   oxyCODONE  (OXYCONTIN ) 15 mg 12 hr tablet Take 1 tablet (15 mg total) by mouth every 12 (twelve) hours. 60 tablet 0   oxyCODONE -acetaminophen  (PERCOCET) 7.5-325 MG tablet Take 1 tablet by mouth every 4 (four) hours as needed for severe pain (pain score 7-10). 90 tablet 0   polyethylene glycol powder (MIRALAX ) 17 GM/SCOOP powder Mix 17 g in 4 oz of liquid and take by mouth daily. 476 g 0   SUMAtriptan  (IMITREX ) 50 MG tablet Take 1 tablet (50 mg total) by mouth every 2 (two) hours as needed  for migraine (max 2 doses in 24 hours.). May repeat in 2 hours if headache persists or recurs. 10 tablet 2   tiZANidine  (ZANAFLEX ) 2 MG tablet Take 1 tablet (2 mg total) by mouth every 8 (eight) hours as needed for muscle spasms. 30 tablet 3   VENTOLIN  HFA 108 (90 Base) MCG/ACT inhaler TAKE 2 PUFFS BY MOUTH EVERY 6 HOURS AS NEEDED FOR WHEEZE OR SHORTNESS OF BREATH 18 each 1   VERZENIO  100 MG tablet TAKE 1 TABLET BY MOUTH 2 TIMES A DAY 56 tablet 3   No current facility-administered medications for this visit.    VITAL  SIGNS: There were no vitals taken for this visit. There were no vitals filed for this visit.   Estimated body mass index is 22.66 kg/m as calculated from the following:   Height as of 04/27/24: 5' 4 (1.626 m).   Weight as of 04/27/24: 132 lb (59.9 kg).   PERFORMANCE STATUS (ECOG) : 1 - Symptomatic but completely ambulatory  Assessment NAD RRR Normal breathing pattern AAO x3  IMPRESSION: Discussed the use of AI scribe software for clinical note transcription with the patient, who gave verbal consent to proceed.  History of Present Illness [name] is a 59 year old female with history of ER positive breast cancer with bone metastasis who I connected with by phone for symptom management follow-up.  No acute distress reported. Patient states she is doing well overall. Denies concerns for nausea, vomiting, constipation, or diarrhea. Takes daily stool softener. Appetite is good.   We discussed her pain at length. Carlis reports that her pain is controlled with her current medication regimen, which includes OxyContin  15 mg every 12 hours and Percocet 7.5 mg/325 mg every 4 hours as needed. She requires a refill for her OxyContin , as she has only two pills left. No adjustments to medications at this time.   All questions answered and support provided.   I discussed the importance of continued conversation with family and their medical providers regarding overall plan of care and treatment options, ensuring decisions are within the context of the patients values and GOCs. Assessment & Plan Assessment and Plan Neoplasm related pain Pain is well controlled with current medication regimen. She reports no issues with constipation, diarrhea, nausea, or vomiting. - Refilled OxyContin  15 mg extended release every 12 hours. - Refilled Percocet 7.5 mg/325 mg every 4 hours as needed.  Palliative care management She is receiving palliative care management for neoplasm-related pain. No new  issues reported with current medications.  Patient expressed understanding and was in agreement with this plan. She also understands that She can call the clinic at any time with any questions, concerns, or complaints.   Any controlled substances utilized were prescribed in the context of palliative care. PDMP has been reviewed.   I provided 20 minutes of non face-to-face telephone visit time during this encounter, and > 50% was spent counseling as documented under my assessment & plan. Visit consisted of counseling and education dealing with the complex and emotionally intense issues of symptom management and palliative care in the setting of serious and potentially life-threatening illness.  Levon Borer, AGPCNP-BC  Palliative Medicine Team/[facility]    "

## 2024-05-07 ENCOUNTER — Other Ambulatory Visit: Payer: Self-pay | Admitting: Hematology and Oncology

## 2024-05-09 ENCOUNTER — Encounter: Payer: Self-pay | Admitting: Hematology and Oncology

## 2024-05-10 ENCOUNTER — Other Ambulatory Visit: Payer: Self-pay

## 2024-05-10 ENCOUNTER — Other Ambulatory Visit: Payer: Self-pay | Admitting: Nurse Practitioner

## 2024-05-10 DIAGNOSIS — C50412 Malignant neoplasm of upper-outer quadrant of left female breast: Secondary | ICD-10-CM

## 2024-05-10 DIAGNOSIS — C50919 Malignant neoplasm of unspecified site of unspecified female breast: Secondary | ICD-10-CM

## 2024-05-10 DIAGNOSIS — G893 Neoplasm related pain (acute) (chronic): Secondary | ICD-10-CM

## 2024-05-10 DIAGNOSIS — Z515 Encounter for palliative care: Secondary | ICD-10-CM

## 2024-05-10 MED ORDER — OXYCODONE-ACETAMINOPHEN 7.5-325 MG PO TABS: 1.0000 | ORAL_TABLET | ORAL | 0 refills | Status: AC | PRN

## 2024-05-25 ENCOUNTER — Inpatient Hospital Stay: Attending: Hematology and Oncology

## 2024-05-25 ENCOUNTER — Inpatient Hospital Stay

## 2024-06-22 ENCOUNTER — Inpatient Hospital Stay: Attending: Hematology and Oncology

## 2024-06-22 ENCOUNTER — Inpatient Hospital Stay

## 2024-07-27 ENCOUNTER — Inpatient Hospital Stay

## 2024-07-27 ENCOUNTER — Inpatient Hospital Stay: Admitting: Hematology and Oncology

## 2024-07-27 ENCOUNTER — Inpatient Hospital Stay: Attending: Hematology and Oncology

## 2024-08-31 ENCOUNTER — Inpatient Hospital Stay: Attending: Hematology and Oncology
